# Patient Record
Sex: Female | Born: 1935 | Race: White | Hispanic: No | Smoking: Never smoker
Health system: Southern US, Community
[De-identification: ages and names within clinical notes are randomized; demographics above are authoritative.]

## PROBLEM LIST (undated history)

## (undated) DIAGNOSIS — J329 Chronic sinusitis, unspecified: Secondary | ICD-10-CM

## (undated) DIAGNOSIS — I10 Essential (primary) hypertension: Secondary | ICD-10-CM

## (undated) DIAGNOSIS — M1990 Unspecified osteoarthritis, unspecified site: Secondary | ICD-10-CM

## (undated) DIAGNOSIS — D649 Anemia, unspecified: Secondary | ICD-10-CM

## (undated) DIAGNOSIS — D126 Benign neoplasm of colon, unspecified: Secondary | ICD-10-CM

## (undated) DIAGNOSIS — M810 Age-related osteoporosis without current pathological fracture: Secondary | ICD-10-CM

## (undated) DIAGNOSIS — G4700 Insomnia, unspecified: Secondary | ICD-10-CM

## (undated) DIAGNOSIS — N63 Unspecified lump in unspecified breast: Secondary | ICD-10-CM

## (undated) DIAGNOSIS — Z860100 Personal history of colon polyps, unspecified: Secondary | ICD-10-CM

## (undated) DIAGNOSIS — H3530 Unspecified macular degeneration: Secondary | ICD-10-CM

## (undated) HISTORY — PX: OTHER SURGICAL HISTORY: SHX169

## (undated) HISTORY — DX: Benign neoplasm of colon, unspecified: D12.6

## (undated) HISTORY — DX: Insomnia, unspecified: G47.00

## (undated) HISTORY — DX: Unspecified lump in unspecified breast: N63.0

## (undated) HISTORY — PX: BREAST LUMPECTOMY: SHX2

## (undated) HISTORY — PX: COLONOSCOPY: SHX174

## (undated) HISTORY — DX: Essential (primary) hypertension: I10

## (undated) HISTORY — DX: Age-related osteoporosis without current pathological fracture: M81.0

## (undated) HISTORY — PX: ABDOMINAL HYSTERECTOMY: SHX81

## (undated) HISTORY — PX: BREAST EXCISIONAL BIOPSY: SUR124

---

## 2004-11-04 ENCOUNTER — Ambulatory Visit: Payer: Self-pay | Admitting: Unknown Physician Specialty

## 2005-03-10 ENCOUNTER — Ambulatory Visit: Payer: Self-pay | Admitting: Family Medicine

## 2006-04-05 ENCOUNTER — Ambulatory Visit: Payer: Self-pay | Admitting: Family Medicine

## 2006-10-05 ENCOUNTER — Ambulatory Visit: Payer: Self-pay | Admitting: Family Medicine

## 2008-05-28 ENCOUNTER — Ambulatory Visit: Payer: Self-pay | Admitting: Family Medicine

## 2009-03-05 ENCOUNTER — Ambulatory Visit: Payer: Self-pay | Admitting: Surgery

## 2009-06-05 ENCOUNTER — Ambulatory Visit: Payer: Self-pay | Admitting: Family Medicine

## 2009-06-17 ENCOUNTER — Ambulatory Visit: Payer: Self-pay | Admitting: Family Medicine

## 2009-12-17 ENCOUNTER — Ambulatory Visit: Payer: Self-pay | Admitting: Family Medicine

## 2010-01-30 ENCOUNTER — Ambulatory Visit: Payer: Self-pay | Admitting: Ophthalmology

## 2010-02-05 ENCOUNTER — Ambulatory Visit: Payer: Self-pay | Admitting: Cardiovascular Disease

## 2010-02-11 ENCOUNTER — Ambulatory Visit: Payer: Self-pay | Admitting: Ophthalmology

## 2010-03-23 ENCOUNTER — Ambulatory Visit: Payer: Self-pay | Admitting: Unknown Physician Specialty

## 2010-06-09 ENCOUNTER — Ambulatory Visit: Payer: Self-pay | Admitting: Family Medicine

## 2010-06-12 ENCOUNTER — Ambulatory Visit: Payer: Self-pay | Admitting: Family Medicine

## 2010-07-29 ENCOUNTER — Emergency Department: Payer: Self-pay | Admitting: Emergency Medicine

## 2010-11-24 ENCOUNTER — Ambulatory Visit: Payer: Self-pay | Admitting: Ophthalmology

## 2010-11-30 ENCOUNTER — Ambulatory Visit: Payer: Self-pay | Admitting: Ophthalmology

## 2011-06-14 ENCOUNTER — Ambulatory Visit: Payer: Self-pay | Admitting: Unknown Physician Specialty

## 2011-06-22 ENCOUNTER — Ambulatory Visit: Payer: Self-pay | Admitting: Family Medicine

## 2013-02-13 ENCOUNTER — Ambulatory Visit: Payer: Self-pay | Admitting: Unknown Physician Specialty

## 2013-06-18 ENCOUNTER — Emergency Department: Payer: Self-pay | Admitting: Emergency Medicine

## 2013-06-18 LAB — BASIC METABOLIC PANEL
Anion Gap: 2 — ABNORMAL LOW (ref 7–16)
BUN: 18 mg/dL (ref 7–18)
CALCIUM: 8.4 mg/dL — AB (ref 8.5–10.1)
CO2: 30 mmol/L (ref 21–32)
Chloride: 102 mmol/L (ref 98–107)
Creatinine: 0.78 mg/dL (ref 0.60–1.30)
EGFR (African American): >60
EGFR (Non-African Amer.): >60
GLUCOSE: 101 mg/dL — AB (ref 65–99)
Osmolality: 270 (ref 275–301)
POTASSIUM: 3.3 mmol/L — AB (ref 3.5–5.1)
SODIUM: 134 mmol/L — AB (ref 136–145)

## 2013-06-18 LAB — CBC WITH DIFFERENTIAL/PLATELET
BASOS ABS: 0 10*3/uL (ref 0.0–0.1)
BASOS PCT: 0.6 %
EOS ABS: 0 10*3/uL (ref 0.0–0.7)
Eosinophil %: 0.1 %
HCT: 35.2 % (ref 35.0–47.0)
HGB: 12.1 g/dL (ref 12.0–16.0)
LYMPHS PCT: 8.7 %
Lymphocyte #: 0.5 10*3/uL — ABNORMAL LOW (ref 1.0–3.6)
MCH: 30.5 pg (ref 26.0–34.0)
MCHC: 34.3 g/dL (ref 32.0–36.0)
MCV: 89 fL (ref 80–100)
MONO ABS: 0.4 x10 3/mm (ref 0.2–0.9)
Monocyte %: 7.5 %
NEUTROS PCT: 83.1 %
Neutrophil #: 4.9 10*3/uL (ref 1.4–6.5)
Platelet: 218 10*3/uL (ref 150–440)
RBC: 3.95 10*6/uL (ref 3.80–5.20)
RDW: 12.7 % (ref 11.5–14.5)
WBC: 5.9 10*3/uL (ref 3.6–11.0)

## 2013-06-18 LAB — SEDIMENTATION RATE: ERYTHROCYTE SED RATE: 15 mm/h (ref 0–30)

## 2014-08-12 ENCOUNTER — Ambulatory Visit: Payer: Self-pay | Admitting: Unknown Physician Specialty

## 2014-09-09 LAB — SURGICAL PATHOLOGY

## 2014-12-12 ENCOUNTER — Other Ambulatory Visit: Payer: Self-pay | Admitting: Family Medicine

## 2014-12-12 DIAGNOSIS — Z1239 Encounter for other screening for malignant neoplasm of breast: Secondary | ICD-10-CM

## 2015-03-04 ENCOUNTER — Encounter: Payer: Self-pay | Admitting: *Deleted

## 2015-03-04 ENCOUNTER — Other Ambulatory Visit: Payer: Self-pay | Admitting: *Deleted

## 2015-03-04 DIAGNOSIS — D126 Benign neoplasm of colon, unspecified: Secondary | ICD-10-CM | POA: Insufficient documentation

## 2015-03-04 DIAGNOSIS — I10 Essential (primary) hypertension: Secondary | ICD-10-CM | POA: Insufficient documentation

## 2015-03-04 DIAGNOSIS — G4700 Insomnia, unspecified: Secondary | ICD-10-CM | POA: Insufficient documentation

## 2015-03-04 DIAGNOSIS — M810 Age-related osteoporosis without current pathological fracture: Secondary | ICD-10-CM | POA: Insufficient documentation

## 2015-03-05 ENCOUNTER — Ambulatory Visit (INDEPENDENT_AMBULATORY_CARE_PROVIDER_SITE_OTHER): Payer: Medicare Other | Admitting: Surgery

## 2015-03-05 ENCOUNTER — Encounter: Payer: Self-pay | Admitting: Surgery

## 2015-03-05 ENCOUNTER — Other Ambulatory Visit: Payer: Self-pay | Admitting: *Deleted

## 2015-03-05 VITALS — BP 128/71 | HR 74 | Temp 97.9°F | Ht 62.0 in | Wt 150.0 lb

## 2015-03-05 DIAGNOSIS — R928 Other abnormal and inconclusive findings on diagnostic imaging of breast: Secondary | ICD-10-CM | POA: Diagnosis not present

## 2015-03-05 NOTE — Patient Instructions (Signed)
Call for an appointment after your visit with [facility] radiology if surgery is needed. If you have any questions, please call our office at your earliest convenience.

## 2015-03-05 NOTE — Progress Notes (Signed)
Surgical Consultation  03/05/2015  Barbara Vega is an 81 y.o. female.   CC: Abnormal right mammogram  HPI: Patient has had multiple screening mammograms performed routinely at [facility] facility in [place]. Recently identified opacities necessitated additional films and an ultrasound be performed on the right breast multiple abnormalities were noted from 3 mm and up.  Ultrasound-guided biopsy was recommended at [facility]. The reports are available for review but the films are not available for personal review.  Patient has had multiple biopsies on both breasts and she's had cysts aspirated in the past on both breast currently this problem is on the right breast.  GYN History:[id number]  Family Breast Cancer History: Sister. diagnosed at an advanced stage of breast cancer approximately age 65  Past Medical History  Diagnosis Date  . Benign breast lumps   . Osteoporosis   . Hypertension   . Insomnia   . Adenomatous colon polyp     Past Surgical History  Procedure Laterality Date  . Abdominal hysterectomy    . Breast lumpectomy      Family History  Problem Relation Age of Onset  . Osteoporosis Mother   . Heart block Brother   . Alcohol abuse Brother     Social History:  reports that she has never smoked. She has never used smokeless tobacco. She reports that she does not drink alcohol or use illicit drugs.  Allergies: No Known Allergies  Medications reviewed.   Review of Systems:   Review of Systems  Constitutional: Negative.   HENT: Negative.   Eyes: Negative.   Respiratory: Negative.   Cardiovascular: Negative.   Gastrointestinal: Negative.   Genitourinary: Negative.   Musculoskeletal: Negative.   Skin: Negative.   Neurological: Negative.   Endo/Heme/Allergies: Negative.   Psychiatric/Behavioral: Negative.      Physical Exam:  BP 128/71 mmHg  Pulse 74  Temp(Src) 97.9 F (36.6 C)  Ht 5\' 2"  (1.575 m)  Wt 150 lb (68.04 kg)  BMI 27.43 kg/m2  Physical Exam    Constitutional: She is well-developed, well-nourished, and in no distress. No distress.  HENT:  Head: Normocephalic and atraumatic.  Eyes: Pupils are equal, round, and reactive to light. No scleral icterus.  Neck: Normal range of motion.  Cardiovascular: Normal rate and regular rhythm.   Pulmonary/Chest: Effort normal. No respiratory distress. She has no wheezes. She has no rales.    C breast exam notes  Abdominal: Soft. She exhibits no distension. There is no tenderness.  Lymphadenopathy:    She has no cervical adenopathy.    Breast Exam: Scars on both breasts no palpable mass in either breast no axillary adenopathy no discharge.    No results found for this or any previous visit (from the past 48 hour(s)). No results found.  Pathology:   Assessment/Plan:  Films are not available for review but the records and dictations have been personally reviewed. At this point I agree with the [facility] radiologist that aspiration and/or core biopsy could be performed on any or all of these abnormalities. That in mind I will recommend that she return to the [facility] facility in [place] to have that performed. I would be happy to review the pathology with her and see her back in 2 weeks for that purpose. Patient and daughter were in agreement with this. Because of the complexities of her mammogram with 4 abnormalities and the fact that she has had serial exams done at a [facility] facility she was recommended to have this done in [place] at  the [facility] facility and I am in agreement with that.  Florene Glen, MD, FACS

## 2015-03-06 ENCOUNTER — Other Ambulatory Visit: Payer: Self-pay | Admitting: Family Medicine

## 2015-03-06 DIAGNOSIS — N6310 Unspecified lump in the right breast, unspecified quadrant: Secondary | ICD-10-CM

## 2015-03-10 ENCOUNTER — Ambulatory Visit: Payer: Self-pay | Admitting: General Surgery

## 2015-03-18 ENCOUNTER — Other Ambulatory Visit: Payer: Self-pay | Admitting: Family Medicine

## 2015-03-18 DIAGNOSIS — N6310 Unspecified lump in the right breast, unspecified quadrant: Secondary | ICD-10-CM

## 2015-03-19 ENCOUNTER — Ambulatory Visit
Admission: RE | Admit: 2015-03-19 | Discharge: 2015-03-19 | Disposition: A | Discharge status: Home / Self Care | Payer: Medicare Other | Source: Ambulatory Visit | Attending: Family Medicine | Admitting: Family Medicine

## 2015-03-19 DIAGNOSIS — N6310 Unspecified lump in the right breast, unspecified quadrant: Secondary | ICD-10-CM

## 2015-04-03 ENCOUNTER — Other Ambulatory Visit: Payer: Self-pay | Admitting: General Surgery

## 2015-04-03 DIAGNOSIS — D241 Benign neoplasm of right breast: Secondary | ICD-10-CM

## 2015-04-03 DIAGNOSIS — D249 Benign neoplasm of unspecified breast: Secondary | ICD-10-CM | POA: Insufficient documentation

## 2015-04-23 ENCOUNTER — Other Ambulatory Visit: Payer: Self-pay | Admitting: General Surgery

## 2015-04-23 DIAGNOSIS — D241 Benign neoplasm of right breast: Secondary | ICD-10-CM

## 2015-05-08 ENCOUNTER — Encounter (HOSPITAL_COMMUNITY): Payer: Self-pay

## 2015-05-08 ENCOUNTER — Other Ambulatory Visit: Payer: Self-pay

## 2015-05-08 ENCOUNTER — Encounter (HOSPITAL_COMMUNITY)
Admission: RE | Admit: 2015-05-08 | Discharge: 2015-05-08 | Disposition: A | Discharge status: Home / Self Care | Payer: Medicare Other | Source: Ambulatory Visit | Attending: General Surgery | Admitting: General Surgery

## 2015-05-08 DIAGNOSIS — N63 Unspecified lump in breast: Secondary | ICD-10-CM | POA: Insufficient documentation

## 2015-05-08 DIAGNOSIS — R9431 Abnormal electrocardiogram [ECG] [EKG]: Secondary | ICD-10-CM | POA: Diagnosis not present

## 2015-05-08 DIAGNOSIS — I10 Essential (primary) hypertension: Secondary | ICD-10-CM | POA: Insufficient documentation

## 2015-05-08 DIAGNOSIS — G4700 Insomnia, unspecified: Secondary | ICD-10-CM | POA: Diagnosis not present

## 2015-05-08 DIAGNOSIS — Z01818 Encounter for other preprocedural examination: Secondary | ICD-10-CM | POA: Insufficient documentation

## 2015-05-08 DIAGNOSIS — H3530 Unspecified macular degeneration: Secondary | ICD-10-CM | POA: Diagnosis not present

## 2015-05-08 DIAGNOSIS — Z79899 Other long term (current) drug therapy: Secondary | ICD-10-CM | POA: Diagnosis not present

## 2015-05-08 DIAGNOSIS — Z7982 Long term (current) use of aspirin: Secondary | ICD-10-CM | POA: Insufficient documentation

## 2015-05-08 DIAGNOSIS — Z01812 Encounter for preprocedural laboratory examination: Secondary | ICD-10-CM | POA: Insufficient documentation

## 2015-05-08 HISTORY — DX: Chronic sinusitis, unspecified: J32.9

## 2015-05-08 HISTORY — DX: Unspecified macular degeneration: H35.30

## 2015-05-08 HISTORY — DX: Personal history of colon polyps, unspecified: Z86.0100

## 2015-05-08 HISTORY — DX: Personal history of colonic polyps: Z86.010

## 2015-05-08 LAB — CBC WITH DIFFERENTIAL/PLATELET
Basophils Absolute: 0.1 10*3/uL (ref 0.0–0.1)
Basophils Relative: 1 %
EOS ABS: 0.7 10*3/uL (ref 0.0–0.7)
Eosinophils Relative: 9 %
HEMATOCRIT: 37 % (ref 36.0–46.0)
HEMOGLOBIN: 12.6 g/dL (ref 12.0–15.0)
LYMPHS ABS: 1.8 10*3/uL (ref 0.7–4.0)
Lymphocytes Relative: 22 %
MCH: 30.2 pg (ref 26.0–34.0)
MCHC: 34.1 g/dL (ref 30.0–36.0)
MCV: 88.7 fL (ref 78.0–100.0)
MONO ABS: 1.3 10*3/uL — AB (ref 0.1–1.0)
MONOS PCT: 16 %
NEUTROS PCT: 52 %
Neutro Abs: 4.2 10*3/uL (ref 1.7–7.7)
Platelets: 273 10*3/uL (ref 150–400)
RBC: 4.17 MIL/uL (ref 3.87–5.11)
RDW: 12.6 % (ref 11.5–15.5)
WBC: 8.1 10*3/uL (ref 4.0–10.5)

## 2015-05-08 LAB — BASIC METABOLIC PANEL
Anion gap: 8 (ref 5–15)
BUN: 10 mg/dL (ref 6–20)
CALCIUM: 9.5 mg/dL (ref 8.9–10.3)
CHLORIDE: 94 mmol/L — AB (ref 101–111)
CO2: 29 mmol/L (ref 22–32)
CREATININE: 0.69 mg/dL (ref 0.44–1.00)
GFR calc non Af Amer: >60 mL/min (ref >60)
GLUCOSE: 106 mg/dL — AB (ref 65–99)
Potassium: 3.8 mmol/L (ref 3.5–5.1)
Sodium: 131 mmol/L — ABNORMAL LOW (ref 135–145)

## 2015-05-08 NOTE — Pre-Procedure Instructions (Signed)
Barbara Vega  05/08/2015      [facility] [address], [place] - [address] [place] [address] Phone: [phone] Fax: [phone]    Your procedure is scheduled on Thurs, Dec 29 @ 7:30 AM  Report to [facility] Admitting at 5:30 AM  Call this number if you have problems the morning of surgery:  [phone]   Remember:  Do not eat food or drink liquids after midnight.               Stop taking your Aspirin along with any Vitamins or Herbal Medications. No Goody's,BC's,Aleve,Ibuprofen,Motrin,Advil,or Fish Oil.   Do not wear jewelry, make-up or nail polish.  Do not wear lotions, powders, or perfumes.    Do not shave 48 hours prior to surgery.    Do not bring valuables to the hospital.  [facility] is not responsible for any belongings or valuables.  Contacts, dentures or bridgework may not be worn into surgery.  Leave your suitcase in the car.  After surgery it may be brought to your room.  For patients admitted to the hospital, discharge time will be determined by your treatment team.  Patients discharged the day of surgery will not be allowed to drive home.    Special instructions:  [place] - Preparing for Surgery  Before surgery, you can play an important role.  Because skin is not sterile, your skin needs to be as free of germs as possible.  You can reduce the number of germs on you skin by washing with CHG (chlorahexidine gluconate) soap before surgery.  CHG is an antiseptic cleaner which kills germs and bonds with the skin to continue killing germs even after washing.  Please DO NOT use if you have an allergy to CHG or antibacterial soaps.  If your skin becomes reddened/irritated stop using the CHG and inform your nurse when you arrive at Short Stay.  Do not shave (including legs and underarms) for at least 48 hours prior to the first CHG shower.  You may shave your face.  Please follow these instructions  carefully:   1.  Shower with CHG Soap the night before surgery and the                                morning of Surgery.  2.  If you choose to wash your hair, wash your hair first as usual with your       normal shampoo.  3.  After you shampoo, rinse your hair and body thoroughly to remove the                      Shampoo.  4.  Use CHG as you would any other liquid soap.  You can apply chg directly       to the skin and wash gently with scrungie or a clean washcloth.  5.  Apply the CHG Soap to your body ONLY FROM THE NECK DOWN.        Do not use on open wounds or open sores.  Avoid contact with your eyes,       ears, mouth and genitals (private parts).  Wash genitals (private parts)       with your normal soap.  6.  Wash thoroughly, paying special attention to the area where your surgery        will  be performed.  7.  Thoroughly rinse your body with warm water from the neck down.  8.  DO NOT shower/wash with your normal soap after using and rinsing off       the CHG Soap.  9.  Pat yourself dry with a clean towel.            10.  Wear clean pajamas.            11.  Place clean sheets on your bed the night of your first shower and do not        sleep with pets.  Day of Surgery  Do not apply any lotions/deoderants the morning of surgery.  Please wear clean clothes to the hospital/surgery center.    Please read over the following fact sheets that you were given. Pain Booklet, Coughing and Deep Breathing and Surgical Site Infection Prevention

## 2015-05-08 NOTE — Progress Notes (Addendum)
Cardiologist denies having one  Medical Md is Dr.James Kary Kos  Echo denies ever having one  Stress test denies ever having one  Heart cath denies ever having one  EKG denies in past yr  CXR denies in past yr

## 2015-05-09 ENCOUNTER — Encounter (HOSPITAL_COMMUNITY): Payer: Self-pay | Admitting: Vascular Surgery

## 2015-05-09 NOTE — Progress Notes (Signed)
Anesthesia Chart Review: Patient is a 80 year old female scheduled for right breast radioactive seed guided excisional biopsy on 05/15/15 by Dr. Donne Hazel. Seed is being implanted 05/13/15 at 12:30 PM.  History includes HTN, macular degeneration, insomnia, non-smoker, hysterectomy. BMI 27. PCP is Dr. [name] with [facility].  Meds include ASA 81 gm, temazepam, Maxzide-25, Ceftin (for sinus infection, started 05/07/15), Os-cal.  05/08/15 EKG: NSR, T wave abnormality, consider anterior ischemia. RSR prime or QR pattern in V1 suggests RV conduction delay (incomplete right BBB). Since last tracing on 11/24/10, T wave changes are more prominent. Septal (V1-2) T wave changes appear similar to 01/30/10 tracing, but now RSR with inverted T wave is noted in V3 as well. No CP or SOB documented from her PAT visit. She denied prior cardiac studies.   Preoperative labs noted. Na 131. Cr 0.69. H/H WNL. Glucose 106.   Above reviewed with anesthesiologist Dr. Linna Caprice. If she remains asymptomatic from a CV standpoint then it is anticipated that she can proceed as planned.   George Hugh [facility] Short Stay Center/Anesthesiology Phone [phone] 05/09/2015 2:28 PM

## 2015-05-15 ENCOUNTER — Ambulatory Visit (HOSPITAL_COMMUNITY): Admission: RE | Admit: 2015-05-15 | Payer: Medicare Other | Source: Ambulatory Visit | Admitting: General Surgery

## 2015-05-15 ENCOUNTER — Encounter (HOSPITAL_COMMUNITY): Admission: RE | Payer: Self-pay | Source: Ambulatory Visit

## 2015-05-15 SURGERY — BREAST LUMPECTOMY WITH RADIOACTIVE SEED LOCALIZATION
Anesthesia: General | Site: Breast | Laterality: Right

## 2015-09-03 ENCOUNTER — Encounter (HOSPITAL_COMMUNITY): Payer: Self-pay

## 2015-09-03 ENCOUNTER — Other Ambulatory Visit: Payer: Self-pay | Admitting: General Surgery

## 2015-09-03 ENCOUNTER — Ambulatory Visit
Admission: RE | Admit: 2015-09-03 | Discharge: 2015-09-03 | Disposition: A | Discharge status: Home / Self Care | Payer: Medicare Other | Source: Ambulatory Visit | Attending: General Surgery | Admitting: General Surgery

## 2015-09-03 ENCOUNTER — Encounter (HOSPITAL_COMMUNITY)
Admission: RE | Admit: 2015-09-03 | Discharge: 2015-09-03 | Disposition: A | Discharge status: Home / Self Care | Payer: Medicare Other | Source: Ambulatory Visit | Attending: General Surgery | Admitting: General Surgery

## 2015-09-03 DIAGNOSIS — Z01818 Encounter for other preprocedural examination: Secondary | ICD-10-CM | POA: Diagnosis not present

## 2015-09-03 DIAGNOSIS — I10 Essential (primary) hypertension: Secondary | ICD-10-CM | POA: Diagnosis not present

## 2015-09-03 DIAGNOSIS — N63 Unspecified lump in breast: Secondary | ICD-10-CM | POA: Insufficient documentation

## 2015-09-03 DIAGNOSIS — H3530 Unspecified macular degeneration: Secondary | ICD-10-CM | POA: Diagnosis not present

## 2015-09-03 DIAGNOSIS — D241 Benign neoplasm of right breast: Secondary | ICD-10-CM

## 2015-09-03 DIAGNOSIS — Z01812 Encounter for preprocedural laboratory examination: Secondary | ICD-10-CM | POA: Diagnosis not present

## 2015-09-03 HISTORY — DX: Anemia, unspecified: D64.9

## 2015-09-03 HISTORY — DX: Unspecified osteoarthritis, unspecified site: M19.90

## 2015-09-03 LAB — BASIC METABOLIC PANEL
Anion gap: 10 (ref 5–15)
BUN: 13 mg/dL (ref 6–20)
CALCIUM: 9.8 mg/dL (ref 8.9–10.3)
CHLORIDE: 99 mmol/L — AB (ref 101–111)
CO2: 27 mmol/L (ref 22–32)
CREATININE: 0.82 mg/dL (ref 0.44–1.00)
Glucose, Bld: 117 mg/dL — ABNORMAL HIGH (ref 65–99)
Potassium: 3.9 mmol/L (ref 3.5–5.1)
SODIUM: 136 mmol/L (ref 135–145)

## 2015-09-03 LAB — CBC
HCT: 37.4 % (ref 36.0–46.0)
HEMOGLOBIN: 12.7 g/dL (ref 12.0–15.0)
MCH: 30.2 pg (ref 26.0–34.0)
MCHC: 34 g/dL (ref 30.0–36.0)
MCV: 88.8 fL (ref 78.0–100.0)
PLATELETS: 370 10*3/uL (ref 150–400)
RBC: 4.21 MIL/uL (ref 3.87–5.11)
RDW: 12.6 % (ref 11.5–15.5)
WBC: 8.2 10*3/uL (ref 4.0–10.5)

## 2015-09-03 NOTE — Pre-Procedure Instructions (Addendum)
Isatou A Neilan  09/03/2015      [facility] [address], [place] - [address] [place] [address] Phone: [phone] Fax: [phone]    Your procedure is scheduled on 09/08/15  Report to [facility] Admitting at 530 A.M.  Call this number if you have problems the morning of surgery:  [phone]   Remember:  Do not eat food or drink liquids after midnight.  Take these medicines the morning of surgery with A SIP OF WATER zyrtec if needed  STOP all herbel meds, nsaids (aleve,naproxen,advil,ibuprofen) 5 days prior to surgery starting tomorrow 09/04/15 including aspirin, os-cal, vit D, multi vit   Do not wear jewelry, make-up or nail polish.  Do not wear lotions, powders, or perfumes.  You may wear deodorant.  Do not shave 48 hours prior to surgery.  Men may shave face and neck.  Do not bring valuables to the hospital.  [facility] is not responsible for any belongings or valuables.  Contacts, dentures or bridgework may not be worn into surgery.  Leave your suitcase in the car.  After surgery it may be brought to your room.  For patients admitted to the hospital, discharge time will be determined by your treatment team.  Patients discharged the day of surgery will not be allowed to drive home.   Name and phone number of your driver:    Special instructions:   Special Instructions: [place] - Preparing for Surgery  Before surgery, you can play an important role.  Because skin is not sterile, your skin needs to be as free of germs as possible.  You can reduce the number of germs on you skin by washing with CHG (chlorahexidine gluconate) soap before surgery.  CHG is an antiseptic cleaner which kills germs and bonds with the skin to continue killing germs even after washing.  Please DO NOT use if you have an allergy to CHG or antibacterial soaps.  If your skin becomes reddened/irritated stop using the CHG and inform your nurse when you  arrive at Short Stay.  Do not shave (including legs and underarms) for at least 48 hours prior to the first CHG shower.  You may shave your face.  Please follow these instructions carefully:   1.  Shower with CHG Soap the night before surgery and the morning of Surgery.  2.  If you choose to wash your hair, wash your hair first as usual with your normal shampoo.  3.  After you shampoo, rinse your hair and body thoroughly to remove the Shampoo.  4.  Use CHG as you would any other liquid soap.  You can apply chg directly  to the skin and wash gently with scrungie or a clean washcloth.  5.  Apply the CHG Soap to your body ONLY FROM THE NECK DOWN.  Do not use on open wounds or open sores.  Avoid contact with your eyes ears, mouth and genitals (private parts).  Wash genitals (private parts)       with your normal soap.  6.  Wash thoroughly, paying special attention to the area where your surgery will be performed.  7.  Thoroughly rinse your body with warm water from the neck down.  8.  DO NOT shower/wash with your normal soap after using and rinsing off the CHG Soap.  9.  Pat yourself dry with a clean towel.            10.  Wear  clean pajamas.            11.  Place clean sheets on your bed the night of your first shower and do not sleep with pets.  Day of Surgery  Do not apply any lotions/deodorants the morning of surgery.  Please wear clean clothes to the hospital/surgery center.  Please read over the following fact sheets that you were given. Pain Booklet, Coughing and Deep Breathing and Surgical Site Infection Prevention

## 2015-09-04 NOTE — Progress Notes (Signed)
Anesthesia Chart Review:  Pt is a 82 year old female scheduled for radioactive seed guided excisional R breast biopsy on 09/08/2015 with Dr. Donne Hazel.   Procedure was originally scheduled for 05/13/15 but was cancelled due to pt having sinus infection.   History includes HTN, macular degeneration, insomnia, non-smoker, hysterectomy, anemia. BMI 28.   PCP is Dr. [name] with [facility].  Meds include ASA 81mg , temazepam, Maxzide-25  05/08/15 EKG: NSR, T wave abnormality, consider anterior ischemia. RSR prime or QR pattern in V1 suggests RV conduction delay (incomplete right BBB). Since last tracing on 11/24/10, T wave changes are more prominent. Septal (V1-2) T wave changes appear similar to 01/30/10 tracing, but now RSR with inverted T wave is noted in V3 as well.  Preoperative labs noted.    No CP or SOB documented from her PAT visit. She denied prior cardiac studies.Case was reviewed with anesthesiologist Dr. Linna Caprice back in December. As long as she remains asymptomatic from a CV standpoint then it is anticipated that she can proceed as planned.   Barbara Vega, Barbara Vega [facility] Short Stay Surgical Center/Anesthesiology Phone: [phone] 09/04/2015 1:45 PM

## 2015-09-07 MED ORDER — CEFAZOLIN SODIUM-DEXTROSE 2-4 GM/100ML-% IV SOLN
2.0000 g | INTRAVENOUS | Status: AC
  Filled 2015-09-07: qty 100

## 2015-09-08 ENCOUNTER — Ambulatory Visit (HOSPITAL_COMMUNITY): Payer: Medicare Other | Admitting: Anesthesiology

## 2015-09-08 ENCOUNTER — Encounter (HOSPITAL_COMMUNITY): Payer: Self-pay | Admitting: General Practice

## 2015-09-08 ENCOUNTER — Encounter (HOSPITAL_COMMUNITY)
Admission: RE | Disposition: A | Discharge status: Home / Self Care | Payer: Self-pay | Source: Ambulatory Visit | Attending: General Surgery

## 2015-09-08 ENCOUNTER — Ambulatory Visit (HOSPITAL_COMMUNITY)
Admission: RE | Admit: 2015-09-08 | Discharge: 2015-09-08 | Disposition: A | Discharge status: Home / Self Care | Payer: Medicare Other | Source: Ambulatory Visit | Attending: General Surgery | Admitting: General Surgery

## 2015-09-08 ENCOUNTER — Ambulatory Visit
Admit: 2015-09-08 | Discharge: 2015-09-08 | Disposition: A | Discharge status: Home / Self Care | Payer: Medicare Other | Attending: General Surgery | Admitting: General Surgery

## 2015-09-08 ENCOUNTER — Ambulatory Visit (HOSPITAL_COMMUNITY): Payer: Medicare Other | Admitting: Emergency Medicine

## 2015-09-08 DIAGNOSIS — Z803 Family history of malignant neoplasm of breast: Secondary | ICD-10-CM | POA: Diagnosis not present

## 2015-09-08 DIAGNOSIS — D241 Benign neoplasm of right breast: Secondary | ICD-10-CM | POA: Diagnosis not present

## 2015-09-08 DIAGNOSIS — I10 Essential (primary) hypertension: Secondary | ICD-10-CM | POA: Diagnosis not present

## 2015-09-08 HISTORY — PX: BREAST LUMPECTOMY: SHX2

## 2015-09-08 HISTORY — PX: RADIOACTIVE SEED GUIDED EXCISIONAL BREAST BIOPSY: SHX6490

## 2015-09-08 SURGERY — RADIOACTIVE SEED GUIDED BREAST BIOPSY
Anesthesia: General | Site: Breast | Laterality: Right

## 2015-09-08 MED ORDER — LACTATED RINGERS IV SOLN
INTRAVENOUS | Status: DC | PRN
  Administered 2015-09-08: 07:00:00 via INTRAVENOUS

## 2015-09-08 MED ORDER — CEFAZOLIN SODIUM-DEXTROSE 2-3 GM-% IV SOLR
INTRAVENOUS | Status: DC | PRN
  Administered 2015-09-08: 2 g via INTRAVENOUS

## 2015-09-08 MED ORDER — OXYCODONE HCL 5 MG PO TABS
5.0000 mg | ORAL_TABLET | Freq: Once | ORAL | Status: AC | PRN
  Administered 2015-09-08: 5 mg via ORAL

## 2015-09-08 MED ORDER — FENTANYL CITRATE (PF) 100 MCG/2ML IJ SOLN
INTRAMUSCULAR | Status: DC | PRN
  Administered 2015-09-08 (×2): 50 ug via INTRAVENOUS

## 2015-09-08 MED ORDER — ROCURONIUM BROMIDE 50 MG/5ML IV SOLN
INTRAVENOUS | Status: AC
  Filled 2015-09-08: qty 1

## 2015-09-08 MED ORDER — ONDANSETRON HCL 4 MG/2ML IJ SOLN: 4.0000 mg | Freq: Four times a day (QID) | INTRAMUSCULAR | Status: DC | PRN

## 2015-09-08 MED ORDER — FENTANYL CITRATE (PF) 250 MCG/5ML IJ SOLN
INTRAMUSCULAR | Status: AC
  Filled 2015-09-08: qty 5

## 2015-09-08 MED ORDER — LIDOCAINE HCL (CARDIAC) 20 MG/ML IV SOLN
INTRAVENOUS | Status: AC
  Filled 2015-09-08: qty 5

## 2015-09-08 MED ORDER — MIDAZOLAM HCL 5 MG/5ML IJ SOLN
INTRAMUSCULAR | Status: DC | PRN
  Administered 2015-09-08: 1 mg via INTRAVENOUS

## 2015-09-08 MED ORDER — FENTANYL CITRATE (PF) 100 MCG/2ML IJ SOLN
25.0000 ug | INTRAMUSCULAR | Status: DC | PRN
  Administered 2015-09-08: 50 ug via INTRAVENOUS

## 2015-09-08 MED ORDER — FENTANYL CITRATE (PF) 100 MCG/2ML IJ SOLN
INTRAMUSCULAR | Status: AC
  Filled 2015-09-08: qty 2

## 2015-09-08 MED ORDER — BUPIVACAINE-EPINEPHRINE 0.25% -1:200000 IJ SOLN
INTRAMUSCULAR | Status: DC | PRN
  Administered 2015-09-08: 10 mL

## 2015-09-08 MED ORDER — PROPOFOL 10 MG/ML IV BOLUS
INTRAVENOUS | Status: AC
  Filled 2015-09-08: qty 20

## 2015-09-08 MED ORDER — BUPIVACAINE-EPINEPHRINE (PF) 0.25% -1:200000 IJ SOLN
INTRAMUSCULAR | Status: AC
  Filled 2015-09-08: qty 30

## 2015-09-08 MED ORDER — OXYCODONE HCL 5 MG PO TABS
ORAL_TABLET | ORAL | Status: AC
  Filled 2015-09-08: qty 1

## 2015-09-08 MED ORDER — SODIUM CHLORIDE 0.9 % IJ SOLN
INTRAMUSCULAR | Status: AC
  Filled 2015-09-08: qty 10

## 2015-09-08 MED ORDER — MIDAZOLAM HCL 2 MG/2ML IJ SOLN
INTRAMUSCULAR | Status: AC
  Filled 2015-09-08: qty 2

## 2015-09-08 MED ORDER — 0.9 % SODIUM CHLORIDE (POUR BTL) OPTIME
TOPICAL | Status: DC | PRN
  Administered 2015-09-08: 1000 mL

## 2015-09-08 MED ORDER — GLYCOPYRROLATE 0.2 MG/ML IJ SOLN
INTRAMUSCULAR | Status: AC
  Filled 2015-09-08: qty 1

## 2015-09-08 MED ORDER — DEXAMETHASONE SODIUM PHOSPHATE 4 MG/ML IJ SOLN
INTRAMUSCULAR | Status: AC
  Filled 2015-09-08: qty 1

## 2015-09-08 MED ORDER — SUCCINYLCHOLINE CHLORIDE 20 MG/ML IJ SOLN
INTRAMUSCULAR | Status: AC
  Filled 2015-09-08: qty 1

## 2015-09-08 MED ORDER — EPHEDRINE SULFATE 50 MG/ML IJ SOLN
INTRAMUSCULAR | Status: AC
  Filled 2015-09-08: qty 1

## 2015-09-08 MED ORDER — PROPOFOL 10 MG/ML IV BOLUS
INTRAVENOUS | Status: DC | PRN
  Administered 2015-09-08: 130 mg via INTRAVENOUS

## 2015-09-08 MED ORDER — EPHEDRINE SULFATE 50 MG/ML IJ SOLN
INTRAMUSCULAR | Status: DC | PRN
  Administered 2015-09-08 (×2): 10 mg via INTRAVENOUS

## 2015-09-08 MED ORDER — OXYCODONE HCL 5 MG/5ML PO SOLN: 5.0000 mg | Freq: Once | ORAL | Status: AC | PRN

## 2015-09-08 MED ORDER — PHENYLEPHRINE 40 MCG/ML (10ML) SYRINGE FOR IV PUSH (FOR BLOOD PRESSURE SUPPORT)
PREFILLED_SYRINGE | INTRAVENOUS | Status: AC
  Filled 2015-09-08: qty 10

## 2015-09-08 MED ORDER — DEXAMETHASONE SODIUM PHOSPHATE 4 MG/ML IJ SOLN
INTRAMUSCULAR | Status: DC | PRN
  Administered 2015-09-08: 4 mg via INTRAVENOUS

## 2015-09-08 MED ORDER — ONDANSETRON HCL 4 MG/2ML IJ SOLN
INTRAMUSCULAR | Status: AC
  Filled 2015-09-08: qty 2

## 2015-09-08 MED ORDER — LIDOCAINE HCL (CARDIAC) 20 MG/ML IV SOLN
INTRAVENOUS | Status: DC | PRN
  Administered 2015-09-08: 80 mg via INTRAVENOUS

## 2015-09-08 SURGICAL SUPPLY — 40 items
BINDER BREAST LRG (GAUZE/BANDAGES/DRESSINGS) ×3 IMPLANT
BLADE SURG 15 STRL LF DISP TIS (BLADE) ×1 IMPLANT
BLADE SURG 15 STRL SS (BLADE) ×2
CHLORAPREP W/TINT 26ML (MISCELLANEOUS) ×3 IMPLANT
CLOSURE WOUND 1/2 X4 (GAUZE/BANDAGES/DRESSINGS) ×1
COVER PROBE W GEL 5X96 (DRAPES) ×3 IMPLANT
COVER SURGICAL LIGHT HANDLE (MISCELLANEOUS) ×3 IMPLANT
DEVICE DUBIN SPECIMEN MAMMOGRA (MISCELLANEOUS) ×3 IMPLANT
DRAPE CHEST BREAST 15X10 FENES (DRAPES) ×3 IMPLANT
DRAPE UTILITY XL STRL (DRAPES) ×3 IMPLANT
ELECT COATED BLADE 2.86 ST (ELECTRODE) ×3 IMPLANT
ELECT REM PT RETURN 9FT ADLT (ELECTROSURGICAL) ×3
ELECTRODE REM PT RTRN 9FT ADLT (ELECTROSURGICAL) ×1 IMPLANT
GLOVE BIO SURGEON STRL SZ7 (GLOVE) ×6 IMPLANT
GLOVE BIOGEL PI IND STRL 7.0 (GLOVE) ×2 IMPLANT
GLOVE BIOGEL PI IND STRL 7.5 (GLOVE) ×1 IMPLANT
GLOVE BIOGEL PI INDICATOR 7.0 (GLOVE) ×4
GLOVE BIOGEL PI INDICATOR 7.5 (GLOVE) ×2
GOWN STRL REUS W/ TWL LRG LVL3 (GOWN DISPOSABLE) ×2 IMPLANT
GOWN STRL REUS W/TWL LRG LVL3 (GOWN DISPOSABLE) ×4
KIT BASIN OR (CUSTOM PROCEDURE TRAY) ×3 IMPLANT
KIT MARKER MARGIN INK (KITS) ×3 IMPLANT
LIQUID BAND (GAUZE/BANDAGES/DRESSINGS) ×3 IMPLANT
MARKER SKIN DUAL TIP RULER LAB (MISCELLANEOUS) ×3 IMPLANT
NEEDLE HYPO 25X1 1.5 SAFETY (NEEDLE) ×3 IMPLANT
NS IRRIG 1000ML POUR BTL (IV SOLUTION) IMPLANT
PACK SURGICAL SETUP 50X90 (CUSTOM PROCEDURE TRAY) ×3 IMPLANT
PENCIL BUTTON HOLSTER BLD 10FT (ELECTRODE) ×3 IMPLANT
SPONGE LAP 18X18 X RAY DECT (DISPOSABLE) ×3 IMPLANT
STRIP CLOSURE SKIN 1/2X4 (GAUZE/BANDAGES/DRESSINGS) ×2 IMPLANT
SUT MNCRL AB 4-0 PS2 18 (SUTURE) ×3 IMPLANT
SUT MON AB 5-0 PS2 18 (SUTURE) ×3 IMPLANT
SUT VIC AB 2-0 SH 27 (SUTURE) ×2
SUT VIC AB 2-0 SH 27XBRD (SUTURE) ×1 IMPLANT
SUT VIC AB 3-0 SH 27 (SUTURE) ×2
SUT VIC AB 3-0 SH 27X BRD (SUTURE) ×1 IMPLANT
SYR BULB 3OZ (MISCELLANEOUS) ×3 IMPLANT
SYR CONTROL 10ML LL (SYRINGE) ×3 IMPLANT
TOWEL OR 17X24 6PK STRL BLUE (TOWEL DISPOSABLE) ×3 IMPLANT
TOWEL OR 17X26 10 PK STRL BLUE (TOWEL DISPOSABLE) ×3 IMPLANT

## 2015-09-08 NOTE — H&P (Signed)
   82 yof with family history of breast cancer in her sister at age 80 who has prior history of benign excisional biopsies bilaterally presents with a screening mm that shows a right breast mass. [place] was done measured 1x1x0.8 mm with no axillary nodes. biopsy was done and this was intraductal papilloma. she has no complaints referable to either breast.  Other Problems  Gastroesophageal Reflux Disease High blood pressure Lump In Breast  Past Surgical History Breast Biopsy Bilateral. Colon Polyp Removal - Colonoscopy Colon Polyp Removal - Open Hysterectomy (not due to cancer) - Partial  Diagnostic Studies History  Mammogram within last year  Allergies  No Known Drug Allergies11/17/2016  Medication History  Multi Vitamin Daily (Oral) Active. Temazepam (30MG  Capsule, Oral) Active. Medications Reconciled  Social History  Caffeine use Carbonated beverages, Coffee, Tea. No alcohol use No drug use Tobacco use Never smoker.  Family History Arthritis Mother, Sister. Breast Cancer Sister. Colon Polyps Sister.  Pregnancy / Birth History Age at menarche 41 years. Age of menopause 38-50 Contraceptive History Intrauterine device. Gravida 3 Maternal age 6-25 Para 3  Review of Systems  General Not Present- Appetite Loss, Chills, Fatigue, Fever, Night Sweats, Weight Gain and Weight Loss. Skin Not Present- Change in Wart/Mole, Dryness, Hives, Jaundice, New Lesions, Non-Healing Wounds, Rash and Ulcer. HEENT Present- Seasonal Allergies and Wears glasses/contact lenses. Not Present- Earache, Hearing Loss, Hoarseness, Nose Bleed, Oral Ulcers, Ringing in the Ears, Sinus Pain, Sore Throat, Visual Disturbances and Yellow Eyes. Respiratory Present- Snoring. Not Present- Bloody sputum, Chronic Cough, Difficulty Breathing and Wheezing. Breast Present- Breast Pain. Not Present- Breast Mass, Nipple Discharge and Skin Changes. Cardiovascular Not Present- Chest Pain,  Difficulty Breathing Lying Down, Leg Cramps, Palpitations, Rapid Heart Rate, Shortness of Breath and Swelling of Extremities. Gastrointestinal Present- Indigestion. Not Present- Abdominal Pain, Bloating, Bloody Stool, Change in Bowel Habits, Chronic diarrhea, Constipation, Difficulty Swallowing, Excessive gas, Gets full quickly at meals, Hemorrhoids, Nausea, Rectal Pain and Vomiting. Female Genitourinary Not Present- Frequency, Nocturia, Painful Urination, Pelvic Pain and Urgency. Musculoskeletal Not Present- Back Pain, Joint Pain, Joint Stiffness, Muscle Pain, Muscle Weakness and Swelling of Extremities. Neurological Not Present- Decreased Memory, Fainting, Headaches, Numbness, Seizures, Tingling, Tremor, Trouble walking and Weakness. Endocrine Not Present- Cold Intolerance, Excessive Hunger, Hair Changes, Heat Intolerance, Hot flashes and New Diabetes. Hematology Not Present- Easy Bruising, Excessive bleeding, Gland problems, HIV and Persistent Infections.  Vitals Weight: 154 lb Height: 62in Body Surface Area: 1.71 m Body Mass Index: 28.17 kg/m  Temp.: 97.75F  Pulse: 64 (Regular)  BP: 120/70 (Sitting, Left Arm, Standard) Physical Exam General Mental Status-Alert. Orientation-Oriented X3. Chest and Lung Exam Chest and lung exam reveals -on auscultation, normal breath sounds, no adventitious sounds and normal vocal resonance. Breast Nipples-No Discharge. Breast - Bilateral-Lumpectomy scar. Breast Lump-No Palpable Breast Mass. Cardiovascular Cardiovascular examination reveals -normal heart sounds, regular rate and rhythm with no murmurs. Lymphatic Head & Neck General Head & Neck Lymphatics: Bilateral - Description - Normal. Axillary General Axillary Region: Bilateral - Description - Normal. Note: no [place] adenopathy  Assessment & Plan  PAPILLOMA OF BREAST (D24.9) Story: right breast seed guided excisional biopsy discussed observation vs excision. she  desires excision and I think reasonable. discussed seed placement, surgery and recovery. will proceed with surgery

## 2015-09-08 NOTE — Anesthesia Postprocedure Evaluation (Signed)
Anesthesia Post Note  Patient: Barbara Vega  Procedure(s) Performed: Procedure(s) (LRB): RADIOACTIVE SEED GUIDED EXCISIONAL BREAST BIOPSY (Right)  Patient location during evaluation: PACU Anesthesia Type: General Level of consciousness: awake and alert and patient cooperative Pain management: pain level controlled Vital Signs Assessment: post-procedure vital signs reviewed and stable Respiratory status: spontaneous breathing and respiratory function stable Cardiovascular status: stable Anesthetic complications: no    Last Vitals:  Filed Vitals:   09/08/15 0900 09/08/15 0918  BP: 115/55 128/57  Pulse: 70 60  Temp:    Resp: 16 18    Last Pain:  Filed Vitals:   09/08/15 0919  PainSc: [place]

## 2015-09-08 NOTE — Anesthesia Procedure Notes (Signed)
Procedure Name: LMA Insertion Date/Time: 09/08/2015 7:32 AM Performed by: Luciana Axe K Pre-anesthesia Checklist: Patient identified, Emergency Drugs available, Suction available and Patient being monitored Patient Re-evaluated:Patient Re-evaluated prior to inductionOxygen Delivery Method: Circle System Utilized Preoxygenation: Pre-oxygenation with 100% oxygen Intubation Type: IV induction Ventilation: Mask ventilation without difficulty LMA: LMA inserted LMA Size: 4.0 Number of attempts: 1 Airway Equipment and Method: Bite block Placement Confirmation: positive ETCO2 Tube secured with: Tape Dental Injury: Teeth and Oropharynx as per pre-operative assessment

## 2015-09-08 NOTE — Anesthesia Preprocedure Evaluation (Addendum)
Anesthesia Evaluation  Patient identified by MRN, date of birth, ID band Patient awake    Reviewed: Allergy & Precautions, H&P , NPO status , Patient's Chart, lab work & pertinent test results  History of Anesthesia Complications Negative for: history of anesthetic complications  Airway Mallampati: II   Neck ROM: full    Dental   Pulmonary neg pulmonary ROS,    breath sounds clear to auscultation       Cardiovascular hypertension, Pt. on medications  Rhythm:regular Rate:Normal     Neuro/Psych negative neurological ROS  negative psych ROS   GI/Hepatic negative GI ROS, Neg liver ROS,   Endo/Other  negative endocrine ROS  Renal/GU negative Renal ROS     Musculoskeletal  (+) Arthritis ,   Abdominal   Peds  Hematology   Anesthesia Other Findings   Reproductive/Obstetrics                            Anesthesia Physical Anesthesia Plan  ASA: II  Anesthesia Plan: General   Post-op Pain Management:    Induction: Intravenous  Airway Management Planned: LMA  Additional Equipment:   Intra-op Plan:   Post-operative Plan:   Informed Consent: I have reviewed the patients History and Physical, chart, labs and discussed the procedure including the risks, benefits and alternatives for the proposed anesthesia with the patient or authorized representative who has indicated his/her understanding and acceptance.     Plan Discussed with: CRNA, Anesthesiologist and Surgeon  Anesthesia Plan Comments:         Anesthesia Quick Evaluation

## 2015-09-08 NOTE — Interval H&P Note (Signed)
History and Physical Interval Note:  09/08/2015 7:16 AM  Barbara Vega  has presented today for surgery, with the diagnosis of RIGHT BREAST PAPILLOMA  The various methods of treatment have been discussed with the patient and family. After consideration of risks, benefits and other options for treatment, the patient has consented to  Procedure(s): RADIOACTIVE SEED GUIDED EXCISIONAL BREAST BIOPSY (Right) as a surgical intervention .  The patient's history has been reviewed, patient examined, no change in status, stable for surgery.  I have reviewed the patient's chart and labs.  Questions were answered to the patient's satisfaction.     Stavroula Rohde

## 2015-09-08 NOTE — Discharge Instructions (Signed)
[place] [facility] Phone Number [phone]  BPOST OP INSTRUCTIONS  Always review your discharge instruction sheet given to you by the facility where your surgery was performed.  IF YOU HAVE DISABILITY OR FAMILY LEAVE FORMS, YOU MUST BRING THEM TO THE OFFICE FOR PROCESSING.  DO NOT GIVE THEM TO YOUR DOCTOR.  1. A prescription for pain medication may be given to you upon discharge.  Take your pain medication as prescribed, if needed.  If narcotic pain medicine is not needed, then you may take acetaminophen (Tylenol), naprosyn (Alleve) or ibuprofen (Advil) as needed. 2. Take your usually prescribed medications unless otherwise directed 3. If you need a refill on your pain medication, please contact your pharmacy.  They will contact our office to request authorization.  Prescriptions will not be filled after 5pm or on week-ends. 4. You should eat very light the first 24 hours after surgery, such as soup, crackers, pudding, etc.  Resume your normal diet the day after surgery. 5. Most patients will experience some swelling and bruising in the breast.  Ice packs and a good support bra will help.  Wear the breast binder provided or a sports bra for 72 hours day and night.  After that wear a sports bra during the day until you return to the office. Swelling and bruising can take several days to resolve.  6. It is common to experience some constipation if taking pain medication after surgery.  Increasing fluid intake and taking a stool softener will usually help or prevent this problem from occurring.  A mild laxative (Milk of Magnesia or Miralax) should be taken according to package directions if there are no bowel movements after 48 hours. 7. Unless discharge instructions indicate otherwise, you may remove your bandages 48 hours after surgery and you may shower at that time.  You may have steri-strips (small skin tapes) in place directly over the incision.  These strips should be left on the  skin for 7-10 days and will come off on their own.  If your surgeon used skin glue on the incision, you may shower in 24 hours.  The glue will flake off over the next 2-3 weeks.  Any sutures or staples will be removed at the office during your follow-up visit. 8. ACTIVITIES:  You may resume regular daily activities (gradually increasing) beginning the next day.  Wearing a good support bra or sports bra minimizes pain and swelling.  You may have sexual intercourse when it is comfortable. a. You may drive when you no longer are taking prescription pain medication, you can comfortably wear a seatbelt, and you can safely maneuver your car and apply brakes. b. RETURN TO WORK:  ______________________________________________________________________________________ 9. You should see your doctor in the office for a follow-up appointment approximately two weeks after your surgery.  Your doctors nurse will typically make your follow-up appointment when she calls you with your pathology report.  Expect your pathology report 3-4 business days after your surgery.  You may call to check if you do not hear from [place] after three days. 10. OTHER INSTRUCTIONS: _______________________________________________________________________________________________ _____________________________________________________________________________________________________________________________________ _____________________________________________________________________________________________________________________________________ _____________________________________________________________________________________________________________________________________  WHEN TO CALL DR Trevia Nop: 1. Fever over 101.0 2. Nausea and/or vomiting. 3. Extreme swelling or bruising. 4. Continued bleeding from incision. 5. Increased pain, redness, or drainage from the incision.  The clinic staff is available to answer your questions during regular  business hours.  Please dont hesitate to call and ask to speak to one of the nurses for clinical concerns.  If  you have a medical emergency, go to the nearest emergency room or call 911.  A surgeon from [facility] is always on call at the hospital.  For further questions, please visit [email or web address]

## 2015-09-08 NOTE — Op Note (Signed)
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  We discussed options and elected to perform excisional biopsy. She had a radioactive seed placed prior to beginning.  Procedure: After informed consent was obtained the patient was taken to the operating room. She was given antibiotics. Sequential compression devices were on her legs. She was then placed under general anesthesia without complication. She was prepped and draped in the standard sterile surgical fashion. A surgical timeout was then performed. I had her mammograms available in the or.  I infiltrated 1% lidocaine and made a periareolar incision after locating the seed.  I then removed the seed and the surrounding tissue.  I confirmed removal of the clip and the seed with mammography. Hemostasis was observed. I painted the specimen. I then closed this with 2-0 vicryl, 3-0 Vicryl, and 5-0 Monocryl. Glue was placed over the wound. A binder was placed. She tolerated this well was extubated and transferred to recovery in stable condition

## 2015-09-08 NOTE — Transfer of Care (Signed)
Immediate Anesthesia Transfer of Care Note  Patient: Barbara Vega  Procedure(s) Performed: Procedure(s): RADIOACTIVE SEED GUIDED EXCISIONAL BREAST BIOPSY (Right)  Patient Location: PACU  Anesthesia Type:General  Level of Consciousness: awake  Airway & Oxygen Therapy: Patient Spontanous Breathing  Post-op Assessment: Report given to RN and Post -op Vital signs reviewed and stable  Post vital signs: Reviewed and stable  Last Vitals:  Filed Vitals:   09/08/15 0613  BP: 144/66  Pulse: 65  Temp: 36.7 C  Resp: 20    Complications: No apparent anesthesia complications

## 2015-09-09 ENCOUNTER — Encounter (HOSPITAL_COMMUNITY): Payer: Self-pay | Admitting: General Surgery

## 2016-06-23 ENCOUNTER — Encounter: Payer: Medicare Other | Admitting: Obstetrics and Gynecology

## 2016-08-09 ENCOUNTER — Other Ambulatory Visit: Payer: Self-pay | Admitting: Nurse Practitioner

## 2016-08-09 DIAGNOSIS — R1319 Other dysphagia: Secondary | ICD-10-CM

## 2016-08-09 DIAGNOSIS — R1310 Dysphagia, unspecified: Secondary | ICD-10-CM | POA: Insufficient documentation

## 2016-08-13 ENCOUNTER — Ambulatory Visit
Admission: RE | Admit: 2016-08-13 | Discharge: 2016-08-13 | Disposition: A | Discharge status: Home / Self Care | Payer: Medicare Other | Source: Ambulatory Visit | Attending: Nurse Practitioner | Admitting: Nurse Practitioner

## 2016-08-13 DIAGNOSIS — R1310 Dysphagia, unspecified: Secondary | ICD-10-CM | POA: Diagnosis not present

## 2016-08-13 DIAGNOSIS — K219 Gastro-esophageal reflux disease without esophagitis: Secondary | ICD-10-CM | POA: Insufficient documentation

## 2016-08-13 DIAGNOSIS — K319 Disease of stomach and duodenum, unspecified: Secondary | ICD-10-CM | POA: Insufficient documentation

## 2016-08-13 DIAGNOSIS — R1319 Other dysphagia: Secondary | ICD-10-CM

## 2016-08-13 DIAGNOSIS — K222 Esophageal obstruction: Secondary | ICD-10-CM | POA: Diagnosis not present

## 2016-08-13 DIAGNOSIS — K449 Diaphragmatic hernia without obstruction or gangrene: Secondary | ICD-10-CM | POA: Diagnosis not present

## 2016-08-16 ENCOUNTER — Other Ambulatory Visit: Payer: Self-pay | Admitting: Family Medicine

## 2016-08-16 DIAGNOSIS — Z1231 Encounter for screening mammogram for malignant neoplasm of breast: Secondary | ICD-10-CM

## 2016-09-13 ENCOUNTER — Ambulatory Visit
Admission: RE | Admit: 2016-09-13 | Discharge: 2016-09-13 | Disposition: A | Discharge status: Home / Self Care | Payer: Medicare Other | Source: Ambulatory Visit | Attending: Family Medicine | Admitting: Family Medicine

## 2016-09-13 DIAGNOSIS — Z1231 Encounter for screening mammogram for malignant neoplasm of breast: Secondary | ICD-10-CM | POA: Insufficient documentation

## 2016-09-20 DIAGNOSIS — K219 Gastro-esophageal reflux disease without esophagitis: Secondary | ICD-10-CM | POA: Insufficient documentation

## 2016-11-03 ENCOUNTER — Ambulatory Visit: Payer: Medicare Other | Admitting: Anesthesiology

## 2016-11-03 ENCOUNTER — Ambulatory Visit
Admission: RE | Admit: 2016-11-03 | Discharge: 2016-11-03 | Disposition: A | Discharge status: Home / Self Care | Payer: Medicare Other | Source: Ambulatory Visit | Attending: Unknown Physician Specialty | Admitting: Unknown Physician Specialty

## 2016-11-03 ENCOUNTER — Encounter
Admission: RE | Disposition: A | Discharge status: Home / Self Care | Payer: Self-pay | Source: Ambulatory Visit | Attending: Unknown Physician Specialty

## 2016-11-03 DIAGNOSIS — M810 Age-related osteoporosis without current pathological fracture: Secondary | ICD-10-CM | POA: Insufficient documentation

## 2016-11-03 DIAGNOSIS — K222 Esophageal obstruction: Secondary | ICD-10-CM | POA: Diagnosis not present

## 2016-11-03 DIAGNOSIS — M1990 Unspecified osteoarthritis, unspecified site: Secondary | ICD-10-CM | POA: Insufficient documentation

## 2016-11-03 DIAGNOSIS — K635 Polyp of colon: Secondary | ICD-10-CM | POA: Insufficient documentation

## 2016-11-03 DIAGNOSIS — Z8262 Family history of osteoporosis: Secondary | ICD-10-CM | POA: Diagnosis not present

## 2016-11-03 DIAGNOSIS — K5730 Diverticulosis of large intestine without perforation or abscess without bleeding: Secondary | ICD-10-CM | POA: Insufficient documentation

## 2016-11-03 DIAGNOSIS — Z8249 Family history of ischemic heart disease and other diseases of the circulatory system: Secondary | ICD-10-CM | POA: Diagnosis not present

## 2016-11-03 DIAGNOSIS — Z79899 Other long term (current) drug therapy: Secondary | ICD-10-CM | POA: Diagnosis not present

## 2016-11-03 DIAGNOSIS — I10 Essential (primary) hypertension: Secondary | ICD-10-CM | POA: Insufficient documentation

## 2016-11-03 DIAGNOSIS — K621 Rectal polyp: Secondary | ICD-10-CM | POA: Insufficient documentation

## 2016-11-03 DIAGNOSIS — Z7982 Long term (current) use of aspirin: Secondary | ICD-10-CM | POA: Diagnosis not present

## 2016-11-03 DIAGNOSIS — Z90710 Acquired absence of both cervix and uterus: Secondary | ICD-10-CM | POA: Insufficient documentation

## 2016-11-03 DIAGNOSIS — Z1211 Encounter for screening for malignant neoplasm of colon: Secondary | ICD-10-CM | POA: Insufficient documentation

## 2016-11-03 DIAGNOSIS — G4700 Insomnia, unspecified: Secondary | ICD-10-CM | POA: Insufficient documentation

## 2016-11-03 DIAGNOSIS — K449 Diaphragmatic hernia without obstruction or gangrene: Secondary | ICD-10-CM | POA: Diagnosis not present

## 2016-11-03 DIAGNOSIS — K640 First degree hemorrhoids: Secondary | ICD-10-CM | POA: Insufficient documentation

## 2016-11-03 DIAGNOSIS — Z811 Family history of alcohol abuse and dependence: Secondary | ICD-10-CM | POA: Diagnosis not present

## 2016-11-03 DIAGNOSIS — H353190 Nonexudative age-related macular degeneration, unspecified eye, stage unspecified: Secondary | ICD-10-CM | POA: Diagnosis not present

## 2016-11-03 DIAGNOSIS — Z86010 Personal history of colonic polyps: Secondary | ICD-10-CM | POA: Diagnosis not present

## 2016-11-03 DIAGNOSIS — K317 Polyp of stomach and duodenum: Secondary | ICD-10-CM | POA: Diagnosis not present

## 2016-11-03 HISTORY — PX: COLONOSCOPY WITH PROPOFOL: SHX5780

## 2016-11-03 HISTORY — PX: ESOPHAGOGASTRODUODENOSCOPY (EGD) WITH PROPOFOL: SHX5813

## 2016-11-03 SURGERY — COLONOSCOPY WITH PROPOFOL
Anesthesia: General

## 2016-11-03 MED ORDER — PROPOFOL 10 MG/ML IV BOLUS
INTRAVENOUS | Status: DC | PRN
  Administered 2016-11-03: 20 mg via INTRAVENOUS
  Administered 2016-11-03: 10 mg via INTRAVENOUS
  Administered 2016-11-03 (×3): 20 mg via INTRAVENOUS
  Administered 2016-11-03: 10 mg via INTRAVENOUS

## 2016-11-03 MED ORDER — GLYCOPYRROLATE 0.2 MG/ML IJ SOLN
INTRAMUSCULAR | Status: AC
  Filled 2016-11-03: qty 1

## 2016-11-03 MED ORDER — GLYCOPYRROLATE 0.2 MG/ML IJ SOLN
INTRAMUSCULAR | Status: DC | PRN
  Administered 2016-11-03: 0.2 mg via INTRAVENOUS

## 2016-11-03 MED ORDER — PROPOFOL 500 MG/50ML IV EMUL
INTRAVENOUS | Status: AC
  Filled 2016-11-03: qty 50

## 2016-11-03 MED ORDER — FENTANYL CITRATE (PF) 100 MCG/2ML IJ SOLN
INTRAMUSCULAR | Status: DC | PRN
  Administered 2016-11-03 (×4): 25 ug via INTRAVENOUS

## 2016-11-03 MED ORDER — PROPOFOL 500 MG/50ML IV EMUL
INTRAVENOUS | Status: DC | PRN
  Administered 2016-11-03: 50 ug/kg/min via INTRAVENOUS

## 2016-11-03 MED ORDER — MIDAZOLAM HCL 2 MG/2ML IJ SOLN
INTRAMUSCULAR | Status: AC
  Filled 2016-11-03: qty 2

## 2016-11-03 MED ORDER — FENTANYL CITRATE (PF) 100 MCG/2ML IJ SOLN
INTRAMUSCULAR | Status: AC
  Filled 2016-11-03: qty 2

## 2016-11-03 MED ORDER — LIDOCAINE HCL (PF) 2 % IJ SOLN
INTRAMUSCULAR | Status: AC
  Filled 2016-11-03: qty 2

## 2016-11-03 MED ORDER — SODIUM CHLORIDE 0.9 % IV SOLN: INTRAVENOUS | Status: DC

## 2016-11-03 MED ORDER — SODIUM CHLORIDE 0.9 % IV SOLN
INTRAVENOUS | Status: DC
  Administered 2016-11-03: 1000 mL via INTRAVENOUS

## 2016-11-03 MED ORDER — LIDOCAINE HCL (PF) 2 % IJ SOLN
INTRAMUSCULAR | Status: DC | PRN
  Administered 2016-11-03: 60 mg

## 2016-11-03 MED ORDER — MIDAZOLAM HCL 5 MG/5ML IJ SOLN
INTRAMUSCULAR | Status: DC | PRN
  Administered 2016-11-03 (×4): 0.5 mg via INTRAVENOUS

## 2016-11-03 NOTE — Op Note (Signed)
[facility] Gastroenterology Patient Name: Barbara Vega Procedure Date: 11/03/2016 8:13 AM MRN: [id number] Account #: [id number] Date of Birth: [birth year] Admit Type: Outpatient Age: 83 Room: [facility] ENDO ROOM 3 Gender: Female Note Status: Finalized Procedure:            Upper GI endoscopy Indications:          Dysphagia Providers:            Manya Silvas, MD Medicines:            Propofol per Anesthesia Complications:        No immediate complications. Procedure:            Pre-Anesthesia Assessment:                       - After reviewing the risks and benefits, the patient                        was deemed in satisfactory condition to undergo the                        procedure.                       After obtaining informed consent, the endoscope was                        passed under direct vision. Throughout the procedure,                        the patient's blood pressure, pulse, and oxygen                        saturations were monitored continuously. The Endoscope                        was introduced through the mouth, and advanced to the                        second part of duodenum. The upper GI endoscopy was                        accomplished without difficulty. The patient tolerated                        the procedure well. Findings:      One moderate (circumferential scarring or stenosis; an endoscope may       pass) benign-appearing, intrinsic stenosis was found 40 cm from the       incisors. And was traversed. After the procedure was done A guidewire       was placed and the scope was withdrawn. Dilation was performed with a       Savary dilator with mild resistance at 17 mm.      A single 8 mm sessile polyp with no bleeding and no stigmata of recent       bleeding was found in the cardia. Biopsies were taken with a cold       forceps for histology. It was soft and looks benign.      A small hiatal hernia was present.      Diffuse  moderate inflammation characterized by erythema and granularity  was found in the duodenal bulb. Biopsies were taken with a cold forceps       for histology. Impression:           - Benign-appearing esophageal stenosis. Dilated.                       - A single gastric polyp. Biopsied.                       - Small hiatal hernia.                       - Duodenitis. Biopsied. Recommendation:       - Await pathology results.                       - Perform a colonoscopy as previously scheduled.                       - soft food for 3 days, eat slowly, chew well, take                        small bites Manya Silvas, MD 11/03/2016 8:39:06 AM This report has been signed electronically. Number of Addenda: 0 Note Initiated On: 11/03/2016 8:13 AM      [facility]

## 2016-11-03 NOTE — Transfer of Care (Signed)
Immediate Anesthesia Transfer of Care Note  Patient: Barbara Vega  Procedure(s) Performed: Procedure(s): COLONOSCOPY WITH PROPOFOL (N/A) ESOPHAGOGASTRODUODENOSCOPY (EGD) WITH PROPOFOL (N/A)  Patient Location: PACU  Anesthesia Type:General  Level of Consciousness: sedated  Airway & Oxygen Therapy: Patient Spontanous Breathing and Patient connected to nasal cannula oxygen  Post-op Assessment: Report given to RN and Post -op Vital signs reviewed and stable  Post vital signs: Reviewed and stable  Last Vitals:  Vitals:   11/03/16 0734  BP: (!) 152/62  Pulse: 62  Resp: 16  Temp: 36.1 C    Last Pain:  Vitals:   11/03/16 0734  TempSrc: Tympanic         Complications: No apparent anesthesia complications

## 2016-11-03 NOTE — Op Note (Signed)
[facility] Gastroenterology Patient Name: Barbara Vega Procedure Date: 11/03/2016 8:09 AM MRN: [id number] Account #: [id number] Date of Birth: Jan 29, 1934 Admit Type: Outpatient Age: 83 Room: [facility] ENDO ROOM 3 Gender: Female Note Status: Finalized Procedure:            Colonoscopy Indications:          High risk colon cancer surveillance: Personal history                        of colonic polyps Providers:            Manya Silvas, MD Referring MD:         Irven Easterly. Kary Kos, MD (Referring MD) Medicines:            Propofol per Anesthesia Complications:        No immediate complications. Procedure:            Pre-Anesthesia Assessment:                       - After reviewing the risks and benefits, the patient                        was deemed in satisfactory condition to undergo the                        procedure.                       After obtaining informed consent, the colonoscope was                        passed under direct vision. Throughout the procedure,                        the patient's blood pressure, pulse, and oxygen                        saturations were monitored continuously. The                        Colonoscope was introduced through the anus and                        advanced to the the cecum, identified by appendiceal                        orifice and ileocecal valve. The colonoscopy was                        performed without difficulty. The patient tolerated the                        procedure well. The quality of the bowel preparation                        was excellent. Findings:      Two sessile polyps were found in the ascending colon. The polyps were       diminutive in size. These polyps were removed with a jumbo cold forceps.       Resection and retrieval were complete.      A diminutive  polyp was found in the rectum. The polyp was sessile. The       polyp was removed with a jumbo cold forceps. Resection and retrieval        were complete.      Internal hemorrhoids were found during endoscopy. The hemorrhoids were       small and Grade I (internal hemorrhoids that do not prolapse).      The exam was otherwise without abnormality.      A few small-mouthed diverticula were found in the sigmoid colon.      The exam was otherwise without abnormality. Impression:           - Two diminutive polyps in the ascending colon, removed                        with a jumbo cold forceps. Resected and retrieved.                       - One diminutive polyp in the rectum, removed with a                        jumbo cold forceps. Resected and retrieved.                       - Internal hemorrhoids.                       - The examination was otherwise normal. Recommendation:       - Await pathology results. Manya Silvas, MD 11/03/2016 9:10:27 AM This report has been signed electronically. Number of Addenda: 0 Note Initiated On: 11/03/2016 8:09 AM Scope Withdrawal Time: 0 hours 14 minutes 44 seconds  Total Procedure Duration: 0 hours 22 minutes 34 seconds       [facility]

## 2016-11-03 NOTE — Anesthesia Postprocedure Evaluation (Signed)
Anesthesia Post Note  Patient: Nancye Grumbine Winquist  Procedure(s) Performed: Procedure(s) (LRB): COLONOSCOPY WITH PROPOFOL (N/A) ESOPHAGOGASTRODUODENOSCOPY (EGD) WITH PROPOFOL (N/A)  Patient location during evaluation: PACU Anesthesia Type: General Level of consciousness: awake Pain management: satisfactory to patient Vital Signs Assessment: post-procedure vital signs reviewed and stable Respiratory status: nonlabored ventilation Cardiovascular status: stable Anesthetic complications: no     Last Vitals:  Vitals:   11/03/16 0930 11/03/16 0940  BP: 127/61 (!) 135/57  Pulse: 62 62  Resp: 18 20  Temp:      Last Pain:  Vitals:   11/03/16 0734  TempSrc: Tympanic                 VAN STAVEREN,Giankarlo Leamer

## 2016-11-03 NOTE — Anesthesia Preprocedure Evaluation (Signed)
Anesthesia Evaluation  Patient identified by MRN, date of birth, ID band Patient awake    Reviewed: Allergy & Precautions  Airway Mallampati: I       Dental  (+) Teeth Intact   Pulmonary neg pulmonary ROS,    breath sounds clear to auscultation       Cardiovascular hypertension, Pt. on medications  Rhythm:Regular     Neuro/Psych negative neurological ROS     GI/Hepatic Neg liver ROS,   Endo/Other  negative endocrine ROS  Renal/GU negative Renal ROS  negative genitourinary   Musculoskeletal   Abdominal Normal abdominal exam  (+)   Peds negative pediatric ROS (+)  Hematology  (+) anemia ,   Anesthesia Other Findings   Reproductive/Obstetrics                             Anesthesia Physical Anesthesia Plan  ASA: II  Anesthesia Plan: General   Post-op Pain Management:    Induction: Intravenous  PONV Risk Score and Plan: 0  Airway Management Planned: Natural Airway and Nasal Cannula  Additional Equipment:   Intra-op Plan:   Post-operative Plan:   Informed Consent: I have reviewed the patients History and Physical, chart, labs and discussed the procedure including the risks, benefits and alternatives for the proposed anesthesia with the patient or authorized representative who has indicated his/her understanding and acceptance.     Plan Discussed with: CRNA  Anesthesia Plan Comments:         Anesthesia Quick Evaluation

## 2016-11-03 NOTE — H&P (Signed)
Primary Care Physician:  [name], MD Primary Gastroenterologist:  Dr. Vira Agar  Pre-Procedure History & Physical: HPI:  Barbara Vega is a 83 y.o. female is here for an endoscopy and colonoscopy.   Past Medical History:  Diagnosis Date  . Adenomatous colon polyp   . Anemia    hx  . Arthritis   . Benign breast lumps   . History of colon polyps    benign  . Hypertension    takes Triamterene-HCTZ daily  . Insomnia    takes Restoril nightly  . Macular degeneration    dry  . Osteoporosis   . Sinus infection    taking Ceftin daily     Past Surgical History:  Procedure Laterality Date  . ABDOMINAL HYSTERECTOMY    . BREAST LUMPECTOMY Right 09/08/2015   intraductal papilloma  . BREAST LUMPECTOMY Left    2 bx done only one scar seen years ago neg  . cataract surgery Bilateral   . COLONOSCOPY    . RADIOACTIVE SEED GUIDED EXCISIONAL BREAST BIOPSY Right 09/08/2015   Procedure: RADIOACTIVE SEED GUIDED EXCISIONAL BREAST BIOPSY;  Surgeon: Rolm Bookbinder, MD;  Location: [facility];  Service: General;  Laterality: Right;    Prior to Admission medications   Medication Sig Start Date End Date Taking? Authorizing Provider  aspirin EC 81 MG tablet Take 81 mg by mouth daily.   Yes [provider]  calcium carbonate (OS-CAL - DOSED IN MG OF ELEMENTAL CALCIUM) 1250 (500 CA) MG tablet Take 1 tablet by mouth daily with breakfast.    Yes [provider]  cetirizine (ZYRTEC) 10 MG tablet Take 10 mg by mouth daily.   Yes [provider]  cholecalciferol (VITAMIN D) 1000 UNITS tablet Take 1,000 Units by mouth daily.   Yes [provider]  Multiple Vitamin (MULTIVITAMIN WITH MINERALS) TABS tablet Take 1 tablet by mouth daily.   Yes [provider]  temazepam (RESTORIL) 30 MG capsule Take 30 mg by mouth at bedtime.  09/13/14  Yes [provider]  triamterene-hydrochlorothiazide (MAXZIDE-25) 37.5-25 MG tablet Take 1 tablet by mouth daily.   04/02/14  Yes [provider]    Allergies as of 08/24/2016  . (No Known Allergies)    Family History  Problem Relation Age of Onset  . Osteoporosis Mother   . Heart block Brother   . Alcohol abuse Brother     Social History   Social History  . Marital status: Divorced    Spouse name: N/A  . Number of children: N/A  . Years of education: N/A   Occupational History  . Not on file.   Social History Main Topics  . Smoking status: Never Smoker  . Smokeless tobacco: Never Used  . Alcohol use No  . Drug use: No  . Sexual activity: Not on file   Other Topics Concern  . Not on file   Social History Narrative  . No narrative on file    Review of Systems: See HPI, otherwise negative ROS  Physical Exam: BP (!) 152/62   Pulse 62   Temp 97 F (36.1 C) (Tympanic)   Resp 16   Ht 5\' 2"  (1.575 m)   Wt 65.8 kg (145 lb)   SpO2 99%   BMI 26.52 kg/m  General:   Alert,  pleasant and cooperative in NAD Head:  Normocephalic and atraumatic. Neck:  Supple; no masses or thyromegaly. Lungs:  Clear throughout to auscultation.    Heart:  Regular rate and  rhythm. Abdomen:  Soft, nontender and nondistended. Normal bowel sounds, without guarding, and without rebound.   Neurologic:  Alert and  oriented x4;  grossly normal neurologically.  Impression/Plan: Barbara Vega is here for an endoscopy and colonoscopy to be performed for [facility] colon polyps and dysphagia with esophageal ring seen on barium study.  Risks, benefits, limitations, and alternatives regarding  endoscopy and colonoscopy have been reviewed with the patient.  Questions have been answered.  All parties agreeable.   Gaylyn Cheers, MD  11/03/2016, 8:18 AM

## 2016-11-03 NOTE — Anesthesia Post-op Follow-up Note (Cosign Needed)
Anesthesia QCDR form completed.        

## 2016-11-04 ENCOUNTER — Encounter: Payer: Self-pay | Admitting: Unknown Physician Specialty

## 2016-11-04 LAB — SURGICAL PATHOLOGY

## 2016-11-16 ENCOUNTER — Encounter: Payer: Self-pay | Admitting: Obstetrics and Gynecology

## 2016-11-16 ENCOUNTER — Ambulatory Visit (INDEPENDENT_AMBULATORY_CARE_PROVIDER_SITE_OTHER): Payer: Medicare Other | Admitting: Obstetrics and Gynecology

## 2016-11-16 VITALS — BP 137/85 | HR 69 | Ht 62.0 in | Wt 141.7 lb

## 2016-11-16 DIAGNOSIS — N952 Postmenopausal atrophic vaginitis: Secondary | ICD-10-CM

## 2016-11-16 DIAGNOSIS — N8110 Cystocele, unspecified: Secondary | ICD-10-CM | POA: Diagnosis not present

## 2016-11-16 MED ORDER — ESTRADIOL 2 MG VA RING: 2.0000 mg | VAGINAL_RING | VAGINAL | 4 refills | Status: DC

## 2016-11-16 NOTE — Patient Instructions (Addendum)
Pelvic Organ Prolapse Pelvic organ prolapse is the stretching, bulging, or dropping of pelvic organs into an abnormal position. It happens when the muscles and tissues that surround and support pelvic structures are stretched or weak. Pelvic organ prolapse can involve:  Vagina (vaginal prolapse).  Uterus (uterine prolapse).  Bladder (cystocele).  Rectum (rectocele).  Intestines (enterocele).  When organs other than the vagina are involved, they often bulge into the vagina or protrude from the vagina, depending on how severe the prolapse is. What are the causes? Causes of this condition include:  Pregnancy, labor, and childbirth.  Long-lasting (chronic) cough.  Chronic constipation.  Obesity.  Past pelvic surgery.  Aging. During and after menopause, a decreased production of the hormone estrogen can weaken pelvic ligaments and muscles.  Consistently lifting more than 50 lb (23 kg).  Buildup of fluid in the abdomen due to certain diseases and other conditions.  What are the signs or symptoms? Symptoms of this condition include:  Loss of bladder control when you cough, sneeze, strain, and exercise (stress incontinence). This may be worse immediately following childbirth, and it may gradually improve over time.  Feeling pressure in your pelvis or vagina. This pressure may increase when you cough or when you are having a bowel movement.  A bulge that protrudes from the opening of your vagina or against your vaginal wall. If your uterus protrudes through the opening of your vagina and rubs against your clothing, you may also experience soreness, ulcers, infection, pain, and bleeding.  Increased effort to have a bowel movement or urinate.  Pain in your low back.  Pain, discomfort, or disinterest in sexual intercourse.  Repeated bladder infections (urinary tract infections).  Difficulty inserting or inability to insert a tampon or applicator.  In some people, this  condition does not cause any symptoms. How is this diagnosed? Your health care provider may perform an internal and external vaginal and rectal exam. During the exam, you may be asked to cough and strain while you are lying down, sitting, and standing up. Your health care provider will determine if other tests are required, such as bladder function tests. How is this treated? In most cases, this condition needs to be treated only if it produces symptoms. No treatment is guaranteed to correct the prolapse or relieve the symptoms completely. Treatment may include:  Lifestyle changes, such as: ? Avoiding drinking beverages that contain caffeine. ? Increasing your intake of high-fiber foods. This can help to decrease constipation and straining during bowel movements. ? Emptying your bladder at scheduled times (bladder training therapy). This can help to reduce or avoid urinary incontinence. ? Losing weight if you are overweight or obese.  Estrogen. Estrogen may help mild prolapse by increasing the strength and tone of pelvic floor muscles.  Kegel exercises. These may help mild cases of prolapse by strengthening and tightening the muscles of the pelvic floor.  Pessary insertion. A pessary is a soft, flexible device that is placed into your vagina by your health care provider to help support the vaginal walls and keep pelvic organs in place.  Surgery. This is often the only form of treatment for severe prolapse. Different types of surgeries are available.  Follow these instructions at home:  Wear a sanitary pad or absorbent product if you have urinary incontinence.  Avoid heavy lifting and straining with exercise and work. Do not hold your breath when you perform mild to moderate lifting and exercise activities. Limit your activities as directed by your health care   provider.  Take medicines only as directed by your health care provider.  Perform Kegel exercises as directed by your health care  provider.  If you have a pessary, take care of it as directed by your health care provider. Contact a health care provider if:  Your symptoms interfere with your daily activities or sex life.  You need medicine to help with the discomfort.  You notice bleeding from the vagina that is not related to your period.  You have a fever.  You have pain or bleeding when you urinate.  You have bleeding when you have a bowel movement.  You lose urine when you have sex.  You have chronic constipation.  You have a pessary that falls out.  You have vaginal discharge that has a bad smell.  You have low abdominal pain or cramping that is unusual for you. This information is not intended to replace advice given to you by your health care provider. Make sure you discuss any questions you have with your health care provider. Document Released: 11/28/2013 Document Revised: 10/09/2015 Document Reviewed: 07/16/2013 [employer]vier Interactive Patient Education  2018 [employer]  

## 2016-11-16 NOTE — Progress Notes (Signed)
GYNECOLOGY CLINIC PROGRESS NOTE Subjective:    Barbara Vega is a 83 y.o. G3P3 female who presents for evaluation of a cystocele. Problem started several years ago. Symptoms include: prolapse of tissue with straining and discomfort: mild. Symptoms have progressed to a point and plateaued.  Burning at night in vaginal area. Denies urinary incontinence, nocturia, dysuria. Patient does report remote history of "bladder tack" ` 20 years ago.    Menstrual History: OB History    Gravida Para Term Preterm AB Living   3 3       3    SAB TAB Ectopic Multiple Live Births           3      Menarche age: 82 No LMP recorded. Patient has had a hysterectomy.    Past Medical History:  Diagnosis Date  . Adenomatous colon polyp   . Anemia    hx  . Arthritis   . Benign breast lumps   . History of colon polyps    benign  . Hypertension    takes Triamterene-HCTZ daily  . Insomnia    takes Restoril nightly  . Macular degeneration    dry  . Osteoporosis   . Sinus infection    taking Ceftin daily     Family History  Problem Relation Age of Onset  . Osteoporosis Mother   . Heart block Brother   . Alcohol abuse Brother     Past Surgical History:  Procedure Laterality Date  . ABDOMINAL HYSTERECTOMY    . BREAST LUMPECTOMY Right 09/08/2015   intraductal papilloma  . BREAST LUMPECTOMY Left    2 bx done only one scar seen years ago neg  . cataract surgery Bilateral   . COLONOSCOPY    . COLONOSCOPY WITH PROPOFOL N/A 11/03/2016   Procedure: COLONOSCOPY WITH PROPOFOL;  Surgeon: Manya Silvas, MD;  Location: [facility] ENDOSCOPY;  Service: Endoscopy;  Laterality: N/A;  . ESOPHAGOGASTRODUODENOSCOPY (EGD) WITH PROPOFOL N/A 11/03/2016   Procedure: ESOPHAGOGASTRODUODENOSCOPY (EGD) WITH PROPOFOL;  Surgeon: Manya Silvas, MD;  Location: [facility] ENDOSCOPY;  Service: Endoscopy;  Laterality: N/A;  . RADIOACTIVE SEED GUIDED EXCISIONAL BREAST BIOPSY Right 09/08/2015   Procedure: RADIOACTIVE SEED GUIDED  EXCISIONAL BREAST BIOPSY;  Surgeon: Rolm Bookbinder, MD;  Location: [place];  Service: General;  Laterality: Right;    Social History   Social History  . Marital status: Divorced    Spouse name: N/A  . Number of children: N/A  . Years of education: N/A   Occupational History  . Not on file.   Social History Main Topics  . Smoking status: Never Smoker  . Smokeless tobacco: Never Used  . Alcohol use No  . Drug use: No  . Sexual activity: Not on file   Other Topics Concern  . Not on file   Social History Narrative  . No narrative on file    Current Outpatient Prescriptions on File Prior to Visit  Medication Sig Dispense Refill  . aspirin EC 81 MG tablet Take 81 mg by mouth daily.    . calcium carbonate (OS-CAL - DOSED IN MG OF ELEMENTAL CALCIUM) 1250 (500 CA) MG tablet Take 1 tablet by mouth daily with breakfast.     . cetirizine (ZYRTEC) 10 MG tablet Take 10 mg by mouth daily.    . cholecalciferol (VITAMIN D) 1000 UNITS tablet Take 1,000 Units by mouth daily.    . Multiple Vitamin (MULTIVITAMIN WITH MINERALS) TABS tablet Take 1 tablet by mouth daily.    Marland Kitchen  temazepam (RESTORIL) 30 MG capsule Take 30 mg by mouth at bedtime.     . triamterene-hydrochlorothiazide (MAXZIDE-25) 37.5-25 MG tablet Take 1 tablet by mouth daily.      No current facility-administered medications on file prior to visit.     No Known Allergies   Review of Systems Pertinent items noted in HPI and remainder of comprehensive ROS otherwise negative.   Objective:     BP 137/85 (BP Location: Left Arm, Patient Position: Sitting, Cuff Size: Normal)   Pulse 69   Ht 5\' 2"  (1.575 m)   Wt 141 lb 11.2 oz (64.3 kg)   BMI 25.92 kg/m  BP 137/85 (BP Location: Left Arm, Patient Position: Sitting, Cuff Size: Normal)   Pulse 69   Ht 5\' 2"  (1.575 m)   Wt 141 lb 11.2 oz (64.3 kg)   BMI 25.92 kg/m  General appearance: alert and no distress Back: symmetric, no curvature. ROM normal. No CVA tenderness. Abdomen:  soft, non-tender; bowel sounds normal; no masses,  no organomegaly Pelvic:     External genitalia: normal general appearance     Urinary system: urethral meatus normal and atrophy present     Vaginal: normal without tenderness or lesions, and atrophic mucosa.  Grade 2 cystocele present. No rectocele.      Cervix: removed surgically     Adnexa: normal bimanual exam, non-palpable     Uterus: removed surgically     Rectal: Deferred Extremities: extremities normal, atraumatic, no cyanosis or edema and varicose veins noted Skin: Skin color, texture, turgor normal. No rashes or lesions  Neurologic: Grossly normal   Assessment:    The patient has a cystocele (Grade 2) Vaginal atrophy  Plan:    Discussed cystoceles/rectoceles and management options with the patient. All questions answered. Neurosurgeon distributed. Conservative approach with Kegal exercises. Initiate hormone replacement therapy per orders.  Prescribed Estring to help with atrophy as well as prolapse.  Discussed pessary vs surgical intervention.  Patient inquires into surgery as she has had this done before, however noted that on her exam, her prolapse was not significant enough that surgery would be recommended.   Follow up in 6 weeks for re-evaluation of symptoms.    Rubie Maid, MD [facility]

## 2016-11-20 ENCOUNTER — Encounter: Payer: Self-pay | Admitting: Internal Medicine

## 2016-11-20 ENCOUNTER — Observation Stay
Admission: EM | Admit: 2016-11-20 | Discharge: 2016-11-21 | Disposition: A | Discharge status: Home / Self Care | Payer: Medicare Other | Attending: Internal Medicine | Admitting: Internal Medicine

## 2016-11-20 DIAGNOSIS — I10 Essential (primary) hypertension: Secondary | ICD-10-CM | POA: Diagnosis not present

## 2016-11-20 DIAGNOSIS — Z86010 Personal history of colonic polyps: Secondary | ICD-10-CM | POA: Insufficient documentation

## 2016-11-20 DIAGNOSIS — H3530 Unspecified macular degeneration: Secondary | ICD-10-CM | POA: Insufficient documentation

## 2016-11-20 DIAGNOSIS — E871 Hypo-osmolality and hyponatremia: Secondary | ICD-10-CM | POA: Diagnosis present

## 2016-11-20 DIAGNOSIS — M810 Age-related osteoporosis without current pathological fracture: Secondary | ICD-10-CM | POA: Insufficient documentation

## 2016-11-20 DIAGNOSIS — R531 Weakness: Secondary | ICD-10-CM | POA: Diagnosis present

## 2016-11-20 DIAGNOSIS — N390 Urinary tract infection, site not specified: Secondary | ICD-10-CM | POA: Diagnosis not present

## 2016-11-20 DIAGNOSIS — G4700 Insomnia, unspecified: Secondary | ICD-10-CM | POA: Insufficient documentation

## 2016-11-20 DIAGNOSIS — E876 Hypokalemia: Secondary | ICD-10-CM | POA: Insufficient documentation

## 2016-11-20 DIAGNOSIS — R1030 Lower abdominal pain, unspecified: Secondary | ICD-10-CM | POA: Diagnosis not present

## 2016-11-20 DIAGNOSIS — K2980 Duodenitis without bleeding: Secondary | ICD-10-CM | POA: Diagnosis not present

## 2016-11-20 DIAGNOSIS — N63 Unspecified lump in unspecified breast: Secondary | ICD-10-CM | POA: Insufficient documentation

## 2016-11-20 LAB — CBC WITH DIFFERENTIAL/PLATELET
BASOS ABS: 0.1 10*3/uL (ref 0–0.1)
BASOS PCT: 1 %
EOS ABS: 0.2 10*3/uL (ref 0–0.7)
EOS PCT: 2 %
HCT: 39.9 % (ref 35.0–47.0)
Hemoglobin: 14.1 g/dL (ref 12.0–16.0)
LYMPHS PCT: 32 %
Lymphs Abs: 2.2 10*3/uL (ref 1.0–3.6)
MCH: 30.8 pg (ref 26.0–34.0)
MCHC: 35.4 g/dL (ref 32.0–36.0)
MCV: 86.9 fL (ref 80.0–100.0)
MONO ABS: 0.7 10*3/uL (ref 0.2–0.9)
Monocytes Relative: 10 %
Neutro Abs: 3.8 10*3/uL (ref 1.4–6.5)
Neutrophils Relative %: 55 %
PLATELETS: 479 10*3/uL — AB (ref 150–440)
RBC: 4.59 MIL/uL (ref 3.80–5.20)
RDW: 13.8 % (ref 11.5–14.5)
WBC: 7 10*3/uL (ref 3.6–11.0)

## 2016-11-20 LAB — COMPREHENSIVE METABOLIC PANEL
ALK PHOS: 62 U/L (ref 38–126)
ALT: 8 U/L — ABNORMAL LOW (ref 14–54)
AST: 22 U/L (ref 15–41)
Albumin: 4.4 g/dL (ref 3.5–5.0)
Anion gap: 9 (ref 5–15)
BILIRUBIN TOTAL: 1 mg/dL (ref 0.3–1.2)
BUN: 5 mg/dL — ABNORMAL LOW (ref 6–20)
CALCIUM: 9.6 mg/dL (ref 8.9–10.3)
CO2: 28 mmol/L (ref 22–32)
Chloride: 86 mmol/L — ABNORMAL LOW (ref 101–111)
Creatinine, Ser: 0.71 mg/dL (ref 0.44–1.00)
GFR calc Af Amer: >60 mL/min (ref >60)
GLUCOSE: 113 mg/dL — AB (ref 65–99)
POTASSIUM: 3.4 mmol/L — AB (ref 3.5–5.1)
Sodium: 123 mmol/L — ABNORMAL LOW (ref 135–145)
TOTAL PROTEIN: 7.4 g/dL (ref 6.5–8.1)

## 2016-11-20 LAB — OSMOLALITY, URINE: Osmolality, Ur: 114 mOsm/kg — ABNORMAL LOW (ref 300–900)

## 2016-11-20 LAB — BASIC METABOLIC PANEL
ANION GAP: 7 (ref 5–15)
BUN: 6 mg/dL (ref 6–20)
CALCIUM: 9.2 mg/dL (ref 8.9–10.3)
CO2: 27 mmol/L (ref 22–32)
Chloride: 93 mmol/L — ABNORMAL LOW (ref 101–111)
Creatinine, Ser: 0.66 mg/dL (ref 0.44–1.00)
GFR calc non Af Amer: >60 mL/min (ref >60)
GLUCOSE: 114 mg/dL — AB (ref 65–99)
POTASSIUM: 3.7 mmol/L (ref 3.5–5.1)
Sodium: 127 mmol/L — ABNORMAL LOW (ref 135–145)

## 2016-11-20 LAB — URINALYSIS, COMPLETE (UACMP) WITH MICROSCOPIC
BILIRUBIN URINE: NEGATIVE
Bacteria, UA: NONE SEEN
GLUCOSE, UA: NEGATIVE mg/dL
HGB URINE DIPSTICK: NEGATIVE
KETONES UR: NEGATIVE mg/dL
LEUKOCYTES UA: NEGATIVE
NITRITE: POSITIVE — AB
PH: 7 (ref 5.0–8.0)
PROTEIN: NEGATIVE mg/dL
SQUAMOUS EPITHELIAL / LPF: NONE SEEN
Specific Gravity, Urine: 1.004 — ABNORMAL LOW (ref 1.005–1.030)

## 2016-11-20 LAB — TROPONIN I: Troponin I: <0.03 ng/mL (ref <0.03)

## 2016-11-20 LAB — SODIUM, URINE, RANDOM: SODIUM UR: 33 mmol/L

## 2016-11-20 MED ORDER — SODIUM CHLORIDE 0.9 % IV SOLN
INTRAVENOUS | Status: DC
  Administered 2016-11-20: 16:00:00 via INTRAVENOUS

## 2016-11-20 MED ORDER — OCUVITE-LUTEIN PO TABS
1.0000 | ORAL_TABLET | Freq: Two times a day (BID) | ORAL | Status: DC
  Administered 2016-11-20 – 2016-11-21 (×2): 1 via ORAL
  Filled 2016-11-20 (×3): qty 1

## 2016-11-20 MED ORDER — CEFTRIAXONE SODIUM 1 G IJ SOLR
INTRAMUSCULAR | Status: AC
  Filled 2016-11-20: qty 10

## 2016-11-20 MED ORDER — ENOXAPARIN SODIUM 40 MG/0.4ML ~~LOC~~ SOLN
40.0000 mg | SUBCUTANEOUS | Status: DC
  Administered 2016-11-20: 40 mg via SUBCUTANEOUS
  Filled 2016-11-20: qty 0.4

## 2016-11-20 MED ORDER — TEMAZEPAM 15 MG PO CAPS
30.0000 mg | ORAL_CAPSULE | Freq: Every day | ORAL | Status: DC
  Administered 2016-11-20: 22:00:00 30 mg via ORAL
  Filled 2016-11-20: qty 2

## 2016-11-20 MED ORDER — LORATADINE 10 MG PO TABS
10.0000 mg | ORAL_TABLET | Freq: Every day | ORAL | Status: DC
  Administered 2016-11-20 – 2016-11-21 (×2): 10 mg via ORAL
  Filled 2016-11-20 (×5): qty 1

## 2016-11-20 MED ORDER — ACETAMINOPHEN 650 MG RE SUPP: 650.0000 mg | Freq: Four times a day (QID) | RECTAL | Status: DC | PRN

## 2016-11-20 MED ORDER — VITAMIN D 1000 UNITS PO TABS
1000.0000 [IU] | ORAL_TABLET | Freq: Every day | ORAL | Status: DC
Start: 2016-11-20 — End: 2016-11-21
  Administered 2016-11-20 – 2016-11-21 (×2): 1000 [IU] via ORAL
  Filled 2016-11-20 (×2): qty 1

## 2016-11-20 MED ORDER — SODIUM CHLORIDE 0.9 % IV SOLN
Freq: Once | INTRAVENOUS | Status: AC
  Administered 2016-11-20: 07:00:00 via INTRAVENOUS

## 2016-11-20 MED ORDER — ONDANSETRON HCL 4 MG/2ML IJ SOLN
INTRAMUSCULAR | Status: AC
  Administered 2016-11-20: 4 mg via INTRAVENOUS
  Filled 2016-11-20: qty 2

## 2016-11-20 MED ORDER — AMLODIPINE BESYLATE 5 MG PO TABS
5.0000 mg | ORAL_TABLET | Freq: Every day | ORAL | Status: DC
  Administered 2016-11-20: 16:00:00 5 mg via ORAL
  Filled 2016-11-20: qty 1

## 2016-11-20 MED ORDER — DEXTROSE 5 % IV SOLN
1.0000 g | INTRAVENOUS | Status: DC
  Administered 2016-11-20 – 2016-11-21 (×2): 1 g via INTRAVENOUS
  Filled 2016-11-20: qty 10

## 2016-11-20 MED ORDER — ONDANSETRON HCL 4 MG/2ML IJ SOLN
4.0000 mg | Freq: Once | INTRAMUSCULAR | Status: AC
  Administered 2016-11-20: 4 mg via INTRAVENOUS

## 2016-11-20 MED ORDER — ACETAMINOPHEN 325 MG PO TABS
650.0000 mg | ORAL_TABLET | Freq: Four times a day (QID) | ORAL | Status: DC | PRN
  Administered 2016-11-20: 650 mg via ORAL
  Filled 2016-11-20: qty 2

## 2016-11-20 MED ORDER — PANTOPRAZOLE SODIUM 40 MG PO TBEC
40.0000 mg | DELAYED_RELEASE_TABLET | Freq: Every day | ORAL | Status: DC
  Administered 2016-11-20 – 2016-11-21 (×2): 40 mg via ORAL
  Filled 2016-11-20 (×2): qty 1

## 2016-11-20 MED ORDER — POTASSIUM CHLORIDE CRYS ER 20 MEQ PO TBCR
40.0000 meq | EXTENDED_RELEASE_TABLET | Freq: Once | ORAL | Status: AC
  Administered 2016-11-20: 40 meq via ORAL
  Filled 2016-11-20: qty 2

## 2016-11-20 NOTE — H&P (Signed)
[facility] [place] NAME: Barbara Vega    MR#:  [id number]  DATE OF BIRTH:  1934-04-21  DATE OF ADMISSION:  11/20/2016  PRIMARY CARE PHYSICIAN: [name], MD   REQUESTING/REFERRING PHYSICIAN: Dr Cephas Darby  CHIEF COMPLAINT:   Chief Complaint  Patient presents with  . Back Pain  . Urinary Tract Infection    HISTORY OF PRESENT ILLNESS:  Barbara Vega  is a 83 y.o. female with a known history of Recent urinary tract infection presents with lower abdominal pain and burning on urination. In the ER she was also found to have a low sodium of 123. Patient does complain of some weakness and poor appetite. States she does drink 60 mL of water per day. She has had some weight loss. Of note she did have a recent endoscopy and colonoscopy on 11/03/2016. She had an esophageal dilation at that time and colon polyps removed. The patient does take Dyazide diuretic also. Hospitalist services were contacted for further evaluation.  PAST MEDICAL HISTORY:   Past Medical History:  Diagnosis Date  . Adenomatous colon polyp   . Anemia    hx  . Arthritis   . Benign breast lumps   . History of colon polyps    benign  . Hypertension    takes Triamterene-HCTZ daily  . Insomnia    takes Restoril nightly  . Macular degeneration    dry  . Osteoporosis   . Sinus infection    taking Ceftin daily     PAST SURGICAL HISTORY:   Past Surgical History:  Procedure Laterality Date  . ABDOMINAL HYSTERECTOMY    . BREAST LUMPECTOMY Right 09/08/2015   intraductal papilloma  . BREAST LUMPECTOMY Left    2 bx done only one scar seen years ago neg  . cataract surgery Bilateral   . COLONOSCOPY    . COLONOSCOPY WITH PROPOFOL N/A 11/03/2016   Procedure: COLONOSCOPY WITH PROPOFOL;  Surgeon: Manya Silvas, MD;  Location: [facility] ENDOSCOPY;  Service: Endoscopy;  Laterality: N/A;  . ESOPHAGOGASTRODUODENOSCOPY (EGD) WITH PROPOFOL N/A 11/03/2016   Procedure: ESOPHAGOGASTRODUODENOSCOPY (EGD) WITH PROPOFOL;  Surgeon: Manya Silvas, MD;  Location: [facility] ENDOSCOPY;  Service: Endoscopy;  Laterality: N/A;  . RADIOACTIVE SEED GUIDED EXCISIONAL BREAST BIOPSY Right 09/08/2015   Procedure: RADIOACTIVE SEED GUIDED EXCISIONAL BREAST BIOPSY;  Surgeon: Rolm Bookbinder, MD;  Location: [place];  Service: General;  Laterality: Right;    SOCIAL HISTORY:   Social History  Substance Use Topics  . Smoking status: Never Smoker  . Smokeless tobacco: Never Used  . Alcohol use No    FAMILY HISTORY:   Family History  Problem Relation Age of Onset  . Osteoporosis Mother   . Heart failure Mother   . Heart block Brother   . Alcohol abuse Brother     DRUG ALLERGIES:  No Known Allergies  REVIEW OF SYSTEMS:  CONSTITUTIONAL: No fever. Cold feeling. Positive for weight loss. Some fatigue.  EYES: No blurred or double vision. Wears glasses EARS, NOSE, AND THROAT: No tinnitus or ear pain. Positive for runny nose and sore throat RESPIRATORY: Occasional cough. No shortness of breath, wheezing or hemoptysis.  CARDIOVASCULAR: No chest pain, orthopnea, edema.  GASTROINTESTINAL: No nausea, vomiting, diarrhea or constipation. Occasional lower abdominal pain. No blood in bowel movements GENITOURINARY: Positive for dysuria. No hematuria.  ENDOCRINE: No polyuria, nocturia,  HEMATOLOGY: No anemia, easy bruising or bleeding SKIN: No rash or lesion. MUSCULOSKELETAL: Positive for joint pain.  NEUROLOGIC: No tingling, numbness, weakness.  PSYCHIATRY: No anxiety or depression.   MEDICATIONS AT HOME:   Prior to Admission medications   Medication Sig Start Date End Date Taking? Authorizing Provider  aspirin EC 81 MG tablet Take 81 mg by mouth daily.   Yes [provider]  cetirizine (ZYRTEC) 10 MG tablet Take 10 mg by mouth daily as needed.    Yes [provider]  cholecalciferol (VITAMIN D) 1000 UNITS tablet Take 1,000 Units by mouth daily.    Yes [provider]  esomeprazole (NEXIUM) 20 MG capsule Take 20 mg by mouth daily as needed.    Yes [provider]  KRILL OIL PO Take 1 tablet by mouth daily.    Yes [provider]  Multiple Vitamins-Minerals (PRESERVISION AREDS 2 PO) Take 1 tablet by mouth 2 (two) times daily.   Yes [provider]  nitrofurantoin, macrocrystal-monohydrate, (MACROBID) 100 MG capsule Take 100 mg by mouth 2 (two) times daily. 11/17/16 11/21/16 Yes [provider]  phenazopyridine (PYRIDIUM) 97 MG tablet Take 97 mg by mouth 3 (three) times daily as needed.    Yes [provider]  temazepam (RESTORIL) 30 MG capsule Take 30 mg by mouth at bedtime.  09/13/14  Yes [provider]  triamterene-hydrochlorothiazide (MAXZIDE-25) 37.5-25 MG tablet Take 1 tablet by mouth daily.  04/02/14  Yes [provider]  calcium carbonate (OS-CAL - DOSED IN MG OF ELEMENTAL CALCIUM) 1250 (500 CA) MG tablet Take 1 tablet by mouth daily with breakfast.     [provider]  diclofenac sodium (VOLTAREN) 1 % GEL Apply topically. 09/01/16 09/01/17  [provider]  diclofenac sodium (VOLTAREN) 1 % GEL  09/01/16   [provider]  estradiol (ESTRING) 2 MG vaginal ring Place 2 mg vaginally every 3 (three) months. follow package directions Patient not taking: Reported on 11/20/2016 11/16/16   Rubie Maid, MD  Multiple Vitamin (MULTIVITAMIN WITH MINERALS) TABS tablet Take 1 tablet by mouth daily.    [provider]  polyethylene glycol-electrolytes (NULYTELY/GOLYTELY) 420 g solution  08/23/16   [provider]    Medication reconciliation still undergoing  VITAL SIGNS:  Blood pressure 107/78, pulse 76, temperature 98 F (36.7 C), temperature source Oral, resp. rate 16, height 5\' 2"  (1.575 m), weight 61.2 kg (135 lb), SpO2 95 %.  PHYSICAL EXAMINATION:  GENERAL:  83 y.o.-year-old patient lying in the bed with no acute distress.  EYES:  Pupils equal, round, reactive to light and accommodation. No scleral icterus. Extraocular muscles intact.  HEENT: Head atraumatic, normocephalic. Oropharynx and nasopharynx clear.  NECK:  Supple, no jugular venous distention. No thyroid enlargement, no tenderness.  LUNGS: Normal breath sounds bilaterally, no wheezing, rales,rhonchi or crepitation. No use of accessory muscles of respiration.  CARDIOVASCULAR: S1, S2 normal. No murmurs, rubs, or gallops.  ABDOMEN: Soft, Some suprapubic discomfort, nondistended. Bowel sounds present. No organomegaly or mass.  EXTREMITIES: No pedal edema, cyanosis, or clubbing.  NEUROLOGIC: Cranial nerves II through XII are intact. Muscle strength 5/5 in all extremities. Sensation intact. Gait not checked.  PSYCHIATRIC: The patient is alert and oriented x 3.  SKIN: No rash, lesion, or ulcer.   LABORATORY PANEL:   CBC  Recent Labs Lab 11/20/16 0630  WBC 7.0  HGB 14.1  HCT 39.9  PLT 479*   ------------------------------------------------------------------------------------------------------------------  Chemistries   Recent Labs Lab 11/20/16 0630  NA 123*  K 3.4*  CL 86*  CO2 28  GLUCOSE 113*  BUN 5*  CREATININE 0.71  CALCIUM 9.6  AST 22  ALT 8*  ALKPHOS 62  BILITOT 1.0   ------------------------------------------------------------------------------------------------------------------  Cardiac Enzymes  Recent Labs Lab 11/20/16 0630  TROPONINI <0.03   ------------------------------------------------------------------------------------------------------------------   IMPRESSION AND PLAN:   1. Hyponatremia. We'll give gentle IV fluid hydration and hold hydrochlorothiazide and monitor closely. Could have been worsened with colonoscopy prep, poor appetite and hydrochlorothiazide. Send off sodium in urine and urine osmolarity. 2. Hypokalemia replace potassium orally 3. Essential hypertension. Watch blood pressure off Dyazide at this  point. 4. Partially treated urinary tract infection. Switch nitrofurantoin over to Rocephin and send off a urine culture. 5. History of macular degeneration 6. Insomnia on temazepam 7. Recent esophageal stretching procedure and duodenitis seen on EGD. Place on PPI   All the records are reviewed and case discussed with ED provider. Management plans discussed with the patient, family and they are in agreement.  CODE STATUS: Full code  TOTAL TIME TAKING CARE OF THIS PATIENT: 50 minutes.    Loletha Grayer M.D on 11/20/2016 at 8:27 AM  Between 7am to 6pm - Pager - [phone]  After 6pm call admission pager [phone]  [facility] Office  [phone]  CC: Primary care physician; [name], MD

## 2016-11-20 NOTE — ED Notes (Signed)
Patient reports having dysuria for several days.  Was seen at local urgent care on Wednesday and diagnosed with UTI.  Reports prescribed antibiotics and azo.  Patient reports feeling no better and that she now has left flank pain.

## 2016-11-20 NOTE — ED Provider Notes (Addendum)
[facility] Emergency Department Provider Note       Time seen: ----------------------------------------- 7:46 AM on 11/20/2016 -----------------------------------------     I have reviewed the triage vital signs and the nursing notes.   HISTORY   Chief Complaint Back Pain and Urinary Tract Infection    HPI Barbara Vega is a 83 y.o. female who presents to the ED for general ill feeling. Patient was seen by her doctor on Wednesday and started on antibiotics for UTI. Patient states she has burning with urination, constant back pain and headache. Patient was scrubbed Macrobid and has been taking Azo without relief. She denies fevers, chills or other complaints. Patient does report she drinks a lot of water.   Past Medical History:  Diagnosis Date  . Adenomatous colon polyp   . Anemia    hx  . Arthritis   . Benign breast lumps   . History of colon polyps    benign  . Hypertension    takes Triamterene-HCTZ daily  . Insomnia    takes Restoril nightly  . Macular degeneration    dry  . Osteoporosis   . Sinus infection    taking Ceftin daily     Patient Active Problem List   Diagnosis Date Noted  . Papilloma of breast 04/03/2015  . Osteoporosis, post-menopausal 03/04/2015  . Cannot sleep 03/04/2015  . BP (high blood pressure) 03/04/2015  . Adenomatous colon polyp 03/04/2015    Past Surgical History:  Procedure Laterality Date  . ABDOMINAL HYSTERECTOMY    . BREAST LUMPECTOMY Right 09/08/2015   intraductal papilloma  . BREAST LUMPECTOMY Left    2 bx done only one scar seen years ago neg  . cataract surgery Bilateral   . COLONOSCOPY    . COLONOSCOPY WITH PROPOFOL N/A 11/03/2016   Procedure: COLONOSCOPY WITH PROPOFOL;  Surgeon: Manya Silvas, MD;  Location: [facility] ENDOSCOPY;  Service: Endoscopy;  Laterality: N/A;  . ESOPHAGOGASTRODUODENOSCOPY (EGD) WITH PROPOFOL N/A 11/03/2016   Procedure: ESOPHAGOGASTRODUODENOSCOPY (EGD) WITH PROPOFOL;   Surgeon: Manya Silvas, MD;  Location: [facility] ENDOSCOPY;  Service: Endoscopy;  Laterality: N/A;  . RADIOACTIVE SEED GUIDED EXCISIONAL BREAST BIOPSY Right 09/08/2015   Procedure: RADIOACTIVE SEED GUIDED EXCISIONAL BREAST BIOPSY;  Surgeon: Rolm Bookbinder, MD;  Location: [place];  Service: General;  Laterality: Right;    Allergies Patient has no known allergies.  Social History Social History  Substance Use Topics  . Smoking status: Never Smoker  . Smokeless tobacco: Never Used  . Alcohol use No    Review of Systems Constitutional: Negative for fever. Cardiovascular: Negative for chest pain. Respiratory: Negative for shortness of breath. Gastrointestinal: Negative for abdominal pain, vomiting and diarrhea. Genitourinary: Positive for dysuria Musculoskeletal: Positive for back pain Skin: Negative for rash. Neurological: Positive for headache, generalized weakness  All systems negative/normal/unremarkable except as stated in the HPI  ____________________________________________   PHYSICAL EXAM:  VITAL SIGNS: ED Triage Vitals  Enc Vitals Group     BP 11/20/16 0625 107/78     Pulse Rate 11/20/16 0625 76     Resp 11/20/16 0625 16     Temp 11/20/16 0625 98 F (36.7 C)     Temp Source 11/20/16 0625 Oral     SpO2 11/20/16 0625 95 %     Weight 11/20/16 0625 135 lb (61.2 kg)     Height 11/20/16 0625 5\' 2"  (1.575 m)     Head Circumference --      Peak Flow --  Pain Score 11/20/16 0624 9     Pain Loc --      Pain Edu? --      Excl. in [place]? --     Constitutional: Alert and oriented. Well appearing and in no distress. Eyes: Conjunctivae are normal. Normal extraocular movements. ENT   Head: Normocephalic and atraumatic.   Nose: No congestion/rhinnorhea.   Mouth/Throat: Mucous membranes are moist.   Neck: No stridor. Cardiovascular: Normal rate, regular rhythm. No murmurs, rubs, or gallops. Respiratory: Normal respiratory effort without tachypnea nor  retractions. Breath sounds are clear and equal bilaterally. No wheezes/rales/rhonchi. Gastrointestinal: Soft and nontender. Normal bowel sounds Musculoskeletal: Nontender with normal range of motion in extremities. No lower extremity tenderness nor edema. Neurologic:  Normal speech and language. No gross focal neurologic deficits are appreciated.  Skin:  Skin is warm, dry and intact. No rash noted. Psychiatric: Mood and affect are normal. Speech and behavior are normal.  ____________________________________________  ED COURSE:  Pertinent labs & imaging results that were available during my care of the patient were reviewed by me and considered in my medical decision making (see chart for details). Patient presents for persistent UTI symptoms, we will assess with labs and imaging as indicated.   Procedures ____________________________________________   LABS (pertinent positives/negatives)  Labs Reviewed  COMPREHENSIVE METABOLIC PANEL - Abnormal; Notable for the following:       Result Value   Sodium 123 (*)    Potassium 3.4 (*)    Chloride 86 (*)    Glucose, Bld 113 (*)    BUN 5 (*)    ALT 8 (*)    All other components within normal limits  CBC WITH DIFFERENTIAL/PLATELET - Abnormal; Notable for the following:    Platelets 479 (*)    All other components within normal limits  URINALYSIS, COMPLETE (UACMP) WITH MICROSCOPIC - Abnormal; Notable for the following:    Color, Urine AMBER (*)    APPearance CLEAR (*)    Specific Gravity, Urine 1.004 (*)    Nitrite POSITIVE (*)    All other components within normal limits  TROPONIN I   ____________________________________________  FINAL ASSESSMENT AND PLAN  Weakness, hyponatremia  Plan: Patient's labs and imaging were dictated above. Patient had presented for Weakness and general ill feeling. She had been started on treatment with Macrobid for UTI. She was found to have hyponatremia here with a sodium of 123. I started her on a  saline infusion as well as given Zofran. Hyponatremia is likely from her blood pressure medication as well as potentially excessive water intake. She is stable for admission at this time.   Earleen Newport, MD   Note: This note was generated in part or whole with voice recognition software. Voice recognition is usually quite accurate but there are transcription errors that can and very often do occur. I apologize for any typographical errors that were not detected and corrected.     Earleen Newport, MD 11/20/16 6761    Earleen Newport, MD 11/20/16 [phone]

## 2016-11-20 NOTE — ED Triage Notes (Signed)
Pt states was seen by PMD on Wednesday for urinary tract infection. Pt states she has burning with urination, constant back pain and a headache. Pt states was prescribed macrobid and has been taking AZO without relief. Skin normal color, warm and dry.

## 2016-11-20 NOTE — Progress Notes (Signed)
Patient is from home alone- uses no equipment. One assist out of bed due to weakness. IVF bolus for low Na & IV Abx given in ED. VSS. Will continue to monitor.

## 2016-11-21 LAB — BASIC METABOLIC PANEL
ANION GAP: 7 (ref 5–15)
BUN: 7 mg/dL (ref 6–20)
CALCIUM: 9.2 mg/dL (ref 8.9–10.3)
CO2: 28 mmol/L (ref 22–32)
CREATININE: 0.78 mg/dL (ref 0.44–1.00)
Chloride: 98 mmol/L — ABNORMAL LOW (ref 101–111)
GFR calc non Af Amer: >60 mL/min (ref >60)
Glucose, Bld: 99 mg/dL (ref 65–99)
Potassium: 3.4 mmol/L — ABNORMAL LOW (ref 3.5–5.1)
Sodium: 133 mmol/L — ABNORMAL LOW (ref 135–145)

## 2016-11-21 LAB — URINE CULTURE: SPECIAL REQUESTS: NORMAL

## 2016-11-21 MED ORDER — CEPHALEXIN 250 MG PO CAPS: 250.0000 mg | ORAL_CAPSULE | Freq: Three times a day (TID) | ORAL | Status: DC

## 2016-11-21 MED ORDER — CEPHALEXIN 250 MG PO CAPS: 250.0000 mg | ORAL_CAPSULE | Freq: Three times a day (TID) | ORAL | 0 refills | Status: DC

## 2016-11-21 MED ORDER — POTASSIUM CHLORIDE CRYS ER 20 MEQ PO TBCR
40.0000 meq | EXTENDED_RELEASE_TABLET | Freq: Once | ORAL | Status: AC
  Administered 2016-11-21: 12:00:00 40 meq via ORAL
  Filled 2016-11-21: qty 2

## 2016-11-21 MED ORDER — AMLODIPINE BESYLATE 5 MG PO TABS
2.5000 mg | ORAL_TABLET | Freq: Every day | ORAL | Status: DC
  Administered 2016-11-21: 2.5 mg via ORAL
  Filled 2016-11-21: qty 1

## 2016-11-21 MED ORDER — POTASSIUM CHLORIDE ER 10 MEQ PO TBCR: EXTENDED_RELEASE_TABLET | ORAL | 0 refills | Status: DC

## 2016-11-21 NOTE — Care Management CC44 (Signed)
Condition Code 44 Documentation Completed  Patient Details  Name: BROOKLYNNE PEREIDA MRN: [id number] Date of Birth: [birth year]   Condition Code 44 given:  Yes Patient signature on Condition Code 44 notice:  Yes Documentation of 2 MD's agreement:  Yes Code 44 added to claim:  Yes    Ival Bible, RN 11/21/2016, 9:51 AM

## 2016-11-21 NOTE — Discharge Summary (Signed)
[facility] NAME: Barbara Vega    MR#:  [id number]  DATE OF BIRTH:  [birth year]  DATE OF ADMISSION:  11/20/2016 ADMITTING PHYSICIAN: Loletha Grayer, MD  DATE OF DISCHARGE: 11/21/2016  PRIMARY CARE PHYSICIAN: [name], MD    ADMISSION DIAGNOSIS:  Hyponatremia [E87.1] Weakness [R53.1]  DISCHARGE DIAGNOSIS:  Active Problems:   Hyponatremia   SECONDARY DIAGNOSIS:   Past Medical History:  Diagnosis Date  . Adenomatous colon polyp   . Anemia    hx  . Arthritis   . Benign breast lumps   . History of colon polyps    benign  . Hypertension    takes Triamterene-HCTZ daily  . Insomnia    takes Restoril nightly  . Macular degeneration    dry  . Osteoporosis   . Sinus infection    taking Ceftin daily     HOSPITAL COURSE:   1.  Hyponatremia. Patient was admitted yesterday with a sodium of 123. The patient was given IV fluids and hydrochlorothiazide was held. Sodium came up to 127 yesterday afternoon and up to 133 this morning. I would continue to not give hydrochlorothiazide at this point. I think this was likely a combination from recent colonoscopy prep, not eating well and hydrochlorothiazide. Follow up next week with PMD for checking electrolytes. 2. Hypokalemia. I will replace potassium orally again today and 3 days upon discharge 3. Essential hypertension. Watch blood pressure off Dyazide at this point. I was considering prescribing Norvasc but diastolic blood pressure on the lower side and rather have her be a little higher than too low. 4. Partially treated urinary tract infection. I discontinued nitrofurantoin and gave 2 doses of Rocephin while here in the hospital. Urine culture still pending at this point. Keflex upon going home 5. History of macular degeneration on PreserVision 6. Insomnia on temazepam 7. Recent esophageal stretching procedure and duodenitis seen on EGD on PPI  DISCHARGE CONDITIONS:    Satisfactory  CONSULTS OBTAINED:   none  DRUG ALLERGIES:  No Known Allergies  DISCHARGE MEDICATIONS:   Current Discharge Medication List    START taking these medications   Details  cephALEXin (KEFLEX) 250 MG capsule Take 1 capsule (250 mg total) by mouth every 8 (eight) hours. Qty: 9 capsule, Refills: 0    potassium chloride (K-DUR) 10 MEQ tablet One tab daily for three days Qty: 3 tablet, Refills: 0      CONTINUE these medications which have NOT CHANGED   Details  aspirin EC 81 MG tablet Take 81 mg by mouth daily.    cetirizine (ZYRTEC) 10 MG tablet Take 10 mg by mouth daily as needed.     cholecalciferol (VITAMIN D) 1000 UNITS tablet Take 1,000 Units by mouth daily.    esomeprazole (NEXIUM) 20 MG capsule Take 20 mg by mouth daily as needed.     KRILL OIL PO Take 1 tablet by mouth daily.     Multiple Vitamins-Minerals (PRESERVISION AREDS 2 PO) Take 1 tablet by mouth 2 (two) times daily.    temazepam (RESTORIL) 30 MG capsule Take 30 mg by mouth at bedtime.     calcium carbonate (OS-CAL - DOSED IN MG OF ELEMENTAL CALCIUM) 1250 (500 CA) MG tablet Take 1 tablet by mouth daily with breakfast.     diclofenac sodium (VOLTAREN) 1 % GEL Apply topically.      STOP taking these medications     nitrofurantoin, macrocrystal-monohydrate, (MACROBID) 100 MG capsule  phenazopyridine (PYRIDIUM) 97 MG tablet      triamterene-hydrochlorothiazide (MAXZIDE-25) 37.5-25 MG tablet          DISCHARGE INSTRUCTIONS:   Follow-up PMD one week  If you experience worsening of your admission symptoms, develop shortness of breath, life threatening emergency, suicidal or homicidal thoughts you must seek medical attention immediately by calling 911 or calling your MD immediately  if symptoms less severe.  You Must read complete instructions/literature along with all the possible adverse reactions/side effects for all the Medicines you take and that have been prescribed to you. Take  any new Medicines after you have completely understood and accept all the possible adverse reactions/side effects.   Please note  You were cared for by a hospitalist during your hospital stay. If you have any questions about your discharge medications or the care you received while you were in the hospital after you are discharged, you can call the unit and asked to speak with the hospitalist on call if the hospitalist that took care of you is not available. Once you are discharged, your primary care physician will handle any further medical issues. Please note that NO REFILLS for any discharge medications will be authorized once you are discharged, as it is imperative that you return to your primary care physician (or establish a relationship with a primary care physician if you do not have one) for your aftercare needs so that they can reassess your need for medications and monitor your lab values.    Today   CHIEF COMPLAINT:   Chief Complaint  Patient presents with  . Back Pain  . Urinary Tract Infection    HISTORY OF PRESENT ILLNESS:  Barbara Vega  is a 83 y.o. female presented with back pain and lower abdominal pain and urinary tract infection. Patient found to have hyponatremia.   VITAL SIGNS:  Blood pressure (!) 134/54, pulse 69, temperature 97.8 F (36.6 C), temperature source Oral, resp. rate 18, height 5\' 2"  (1.575 m), weight 67.4 kg (148 lb 8 oz), SpO2 97 %.    PHYSICAL EXAMINATION:  GENERAL:  82 y.o.-year-old patient lying in the bed with no acute distress.  EYES: Pupils equal, round, reactive to light and accommodation. No scleral icterus. Extraocular muscles intact.  HEENT: Head atraumatic, normocephalic. Oropharynx and nasopharynx clear.  NECK:  Supple, no jugular venous distention. No thyroid enlargement, no tenderness.  LUNGS: Normal breath sounds bilaterally, no wheezing, rales,rhonchi or crepitation. No use of accessory muscles of respiration.  CARDIOVASCULAR: S1,  S2 normal. No murmurs, rubs, or gallops.  ABDOMEN: Soft, non-tender, non-distended. Bowel sounds present. No organomegaly or mass.  EXTREMITIES: No pedal edema, cyanosis, or clubbing.  NEUROLOGIC: Cranial nerves II through XII are intact. Muscle strength 5/5 in all extremities. Sensation intact. Gait not checked.  PSYCHIATRIC: The patient is alert and oriented x 3.  SKIN: No obvious rash, lesion, or ulcer.   DATA REVIEW:   CBC  Recent Labs Lab 11/20/16 0630  WBC 7.0  HGB 14.1  HCT 39.9  PLT 479*    Chemistries   Recent Labs Lab 11/20/16 0630  11/21/16 0752  NA 123*  < > 133*  K 3.4*  < > 3.4*  CL 86*  < > 98*  CO2 28  < > 28  GLUCOSE 113*  < > 99  BUN 5*  < > 7  CREATININE 0.71  < > 0.78  CALCIUM 9.6  < > 9.2  AST 22  --   --  ALT 8*  --   --   ALKPHOS 62  --   --   BILITOT 1.0  --   --   < > = values in this interval not displayed.  Cardiac Enzymes  Recent Labs Lab 11/20/16 0630  TROPONINI <0.03     Management plans discussed with the patient, And she is in agreement.  CODE STATUS:     Code Status Orders        Start     Ordered   11/20/16 0825  Full code  Continuous     11/20/16 0824    Code Status History    Date Active Date Inactive Code Status Order ID Comments User Context   This patient has a current code status but no historical code status.    Advance Directive Documentation     Most Recent Value  Type of Advance Directive  Healthcare Power of [place], Living will [POA- Marchia Bond and Herbie [place]]  Pre-existing out of facility DNR order (yellow form or pink MOST form)  -  "MOST" Form in Place?  -      TOTAL TIME TAKING CARE OF THIS PATIENT: 35 minutes.    Loletha Grayer M.D on 11/21/2016 at 11:49 AM  Between 7am to 6pm - Pager - [phone]  After 6pm go to [email or web address] - password [email or web address] [facility]  [phone]  CC: Primary care physician; [name], MD

## 2016-11-21 NOTE — Care Management CC44 (Signed)
Condition Code 44 Documentation Completed  Patient Details  Name: SHAMEKA AGGARWAL MRN: [id number] Date of Birth: Mar 20, 1934   Condition Code 44 given:  Yes Patient signature on Condition Code 44 notice:  Yes Documentation of 2 MD's agreement:  Yes Code 44 added to claim:  Yes    Lateefa Crosby A, RN 11/21/2016, 9:46 AM

## 2016-11-21 NOTE — Care Management Obs Status (Signed)
[place] NOTIFICATION   Patient Details  Name: Barbara Vega MRN: [id number] Date of Birth: November 15, 1933   Medicare Observation Status Notification Given:  Yes Manson Allan letter)    Mardene Speak, RN 11/21/2016, 9:46 AM

## 2016-11-21 NOTE — Progress Notes (Signed)
Nutrition Brief Note  Patient identified on the Malnutrition Screening Tool (MST) Report  Wt Readings from Last 15 Encounters:  11/20/16 148 lb 8 oz (67.4 kg)  11/16/16 141 lb 11.2 oz (64.3 kg)  11/03/16 145 lb (65.8 kg)  09/08/15 151 lb (68.5 kg)  09/03/15 151 lb 6.4 oz (68.7 kg)  05/08/15 149 lb 11.2 oz (67.9 kg)  03/05/15 150 lb (68 kg)   Spoke with patient at bedside. She reports her appetite is good and she is eating well. She reports she eats 3 meals per day and usually finishes all of her meals at home. Reports her partial dentures are at home so she has had some difficulty chewing hard foods while here, but besides that reports she has eaten well. Used to have difficulty swallowing, but after recent esophageal dilation does not have any trouble with swallowing anymore. Patient reports her UBW is around 145 lbs and she does not think she has been losing weight. She drinks strawberry Carn[employer] Instant Breakfast regularly at home.   Unable to complete nutrition-focused physical exam as patient was already dressed in her clothes ready to leave. Has order to discharge home today.  Patient does not meet criteria for malnutrition at this time.  Body mass index is 27.16 kg/m. Patient meets criteria for overweight based on current BMI.   Current diet order is Regular, patient is consuming approximately 80-90% of meals today (yesterday was bites-15%). Labs and medications reviewed.   No nutrition interventions warranted at this time. If nutrition issues arise, please consult RD.   Willey Blade, MS, RD, LDN Pager: [phone] After Hours Pager: [phone]

## 2016-11-21 NOTE — Progress Notes (Signed)
Pt is being discharged home. Discharge papers given and explained to pt. Pt verbalized understanding. F/u appointments and meds reviewed with pt. RX given. Awaiting transportation.

## 2016-12-07 ENCOUNTER — Other Ambulatory Visit: Payer: Self-pay | Admitting: Family Medicine

## 2016-12-07 DIAGNOSIS — R1031 Right lower quadrant pain: Secondary | ICD-10-CM

## 2016-12-09 ENCOUNTER — Ambulatory Visit
Admission: RE | Admit: 2016-12-09 | Discharge: 2016-12-09 | Disposition: A | Discharge status: Home / Self Care | Payer: Medicare Other | Source: Ambulatory Visit | Attending: Family Medicine | Admitting: Family Medicine

## 2016-12-09 DIAGNOSIS — R1013 Epigastric pain: Secondary | ICD-10-CM | POA: Insufficient documentation

## 2016-12-09 DIAGNOSIS — R1031 Right lower quadrant pain: Secondary | ICD-10-CM

## 2016-12-10 ENCOUNTER — Telehealth: Payer: Self-pay | Admitting: Obstetrics and Gynecology

## 2016-12-10 ENCOUNTER — Other Ambulatory Visit: Payer: Self-pay

## 2016-12-10 DIAGNOSIS — N8110 Cystocele, unspecified: Secondary | ICD-10-CM

## 2016-12-10 NOTE — Telephone Encounter (Signed)
Patient lvm stating that she might possibly need her bladder tacked up. Patient would like to speak with Charlott Rakes to discuss her options of her bladder issues. Please advise.

## 2016-12-10 NOTE — Telephone Encounter (Signed)
Called pt she desire referral to Urology, referral placed

## 2016-12-10 NOTE — Telephone Encounter (Signed)
Called pt she states that she wants a bladder tact, informed pt that this is not a service that Dr.Cherry performs offered pt to see Dr.Evans or see female provider at Urology. Pt states that she prefers urology. Will place referral.

## 2016-12-20 ENCOUNTER — Encounter: Payer: Self-pay | Admitting: Urology

## 2016-12-20 ENCOUNTER — Ambulatory Visit (INDEPENDENT_AMBULATORY_CARE_PROVIDER_SITE_OTHER): Payer: Medicare Other | Admitting: Urology

## 2016-12-20 VITALS — BP 123/77 | HR 86 | Ht 62.0 in | Wt 143.7 lb

## 2016-12-20 DIAGNOSIS — N8110 Cystocele, unspecified: Secondary | ICD-10-CM

## 2016-12-20 DIAGNOSIS — N8111 Cystocele, midline: Secondary | ICD-10-CM | POA: Diagnosis not present

## 2016-12-20 LAB — URINALYSIS, COMPLETE
Bilirubin, UA: NEGATIVE
Glucose, UA: NEGATIVE
Ketones, UA: NEGATIVE
Nitrite, UA: NEGATIVE
PH UA: 7 (ref 5.0–7.5)
Protein, UA: NEGATIVE
SPEC GRAV UA: 1.015 (ref 1.005–1.030)
Urobilinogen, Ur: 0.2 mg/dL (ref 0.2–1.0)

## 2016-12-20 LAB — MICROSCOPIC EXAMINATION

## 2016-12-20 NOTE — Progress Notes (Signed)
12/20/2016 1:36 PM   Barbara Vega 03/15/1934 [id number]  Referring provider: [name], MD [address] [facility] [place], [place] [address]  Chief Complaint  Patient presents with  . New Patient (Initial Visit)    Bladder prolapse    HPI: I was consulted to assess the patient's prolapse that has been pretty consistent over 3 years. She feels a little bit of bulging. She does no splinting. It's not daily. Sometimes her burns. She is continent and can void every 3 or 4 hours and has minimal nocturia. She hasn't frequent bladder infections.  She has had a hysterectomy. She has not had previous GU surgery. She does not have neurologic issues.  Bowel function is normal  Modifying factors: There are no other modifying factors  Associated signs and symptoms: There are no other associated signs and symptoms Aggravating and relieving factors: There are no other aggravating or relieving factors Severity: Moderate Duration: Persistent     PMH: Past Medical History:  Diagnosis Date  . Adenomatous colon polyp   . Anemia    hx  . Arthritis   . Benign breast lumps   . History of colon polyps    benign  . Hypertension    takes Triamterene-HCTZ daily  . Insomnia    takes Restoril nightly  . Macular degeneration    dry  . Osteoporosis   . Sinus infection    taking Ceftin daily     Surgical History: Past Surgical History:  Procedure Laterality Date  . ABDOMINAL HYSTERECTOMY    . BREAST LUMPECTOMY Right 09/08/2015   intraductal papilloma  . BREAST LUMPECTOMY Left    2 bx done only one scar seen years ago neg  . cataract surgery Bilateral   . COLONOSCOPY    . COLONOSCOPY WITH PROPOFOL N/A 11/03/2016   Procedure: COLONOSCOPY WITH PROPOFOL;  Surgeon: Manya Silvas, MD;  Location: [facility] ENDOSCOPY;  Service: Endoscopy;  Laterality: N/A;  . ESOPHAGOGASTRODUODENOSCOPY (EGD) WITH PROPOFOL N/A 11/03/2016   Procedure: ESOPHAGOGASTRODUODENOSCOPY (EGD) WITH  PROPOFOL;  Surgeon: Manya Silvas, MD;  Location: [facility] ENDOSCOPY;  Service: Endoscopy;  Laterality: N/A;  . RADIOACTIVE SEED GUIDED EXCISIONAL BREAST BIOPSY Right 09/08/2015   Procedure: RADIOACTIVE SEED GUIDED EXCISIONAL BREAST BIOPSY;  Surgeon: Rolm Bookbinder, MD;  Location: [facility];  Service: General;  Laterality: Right;    Home Medications:  Allergies as of 12/20/2016   No Known Allergies     Medication List       Accurate as of 12/20/16  1:36 PM. Always use your most recent med list.          aspirin EC 81 MG tablet Take 81 mg by mouth daily.   calcium carbonate 1250 (500 Ca) MG tablet Commonly known as:  OS-CAL - dosed in mg of elemental calcium Take 1 tablet by mouth daily with breakfast.   cetirizine 10 MG tablet Commonly known as:  ZYRTEC Take 10 mg by mouth daily as needed.   cholecalciferol 1000 units tablet Commonly known as:  VITAMIN D Take 1,000 Units by mouth daily.   diclofenac sodium 1 % Gel Commonly known as:  VOLTAREN Apply topically.   esomeprazole 20 MG capsule Commonly known as:  NEXIUM Take 20 mg by mouth daily as needed.   KRILL OIL PO Take 1 tablet by mouth daily.   potassium chloride 10 MEQ tablet Commonly known as:  K-DUR One tab daily for three days   PRESERVISION AREDS 2 PO Take 1 tablet by  mouth 2 (two) times daily.   temazepam 30 MG capsule Commonly known as:  RESTORIL Take 30 mg by mouth at bedtime.       Allergies: No Known Allergies  Family History: Family History  Problem Relation Age of Onset  . Osteoporosis Mother   . Heart failure Mother   . Heart block Brother   . Alcohol abuse Brother   . Bladder Cancer Neg Hx   . Kidney cancer Neg Hx     Social History:  reports that she has never smoked. She has never used smokeless tobacco. She reports that she does not drink alcohol or use drugs.  ROS: UROLOGY Frequent Urination?: No Hard to postpone urination?: No Burning/pain with urination?: Yes Get up at  night to urinate?: No Leakage of urine?: No Urine stream starts and stops?: No Trouble starting stream?: No Do you have to strain to urinate?: No Blood in urine?: No Urinary tract infection?: No Sexually transmitted disease?: No Injury to kidneys or bladder?: No Painful intercourse?: No Weak stream?: No Currently pregnant?: No Vaginal bleeding?: No Last menstrual period?: n  Gastrointestinal Nausea?: No Vomiting?: No Indigestion/heartburn?: No Diarrhea?: No Constipation?: No  Constitutional Fever: No Night sweats?: No Weight loss?: No Fatigue?: No  Skin Skin rash/lesions?: No Itching?: No  Eyes Blurred vision?: No Double vision?: No  Ears/Nose/Throat Sore throat?: No Sinus problems?: No  Hematologic/Lymphatic Swollen glands?: No Easy bruising?: No  Cardiovascular Leg swelling?: No Chest pain?: No  Respiratory Cough?: No Shortness of breath?: No  Endocrine Excessive thirst?: No  Musculoskeletal Back pain?: No Joint pain?: No  Neurological Headaches?: No Dizziness?: No  Psychologic Depression?: No Anxiety?: No  Physical Exam: BP 123/77 (BP Location: Left Arm, Patient Position: Sitting, Cuff Size: Normal)   Pulse 86   Ht 5\' 2"  (1.575 m)   Wt 65.2 kg (143 lb 11.2 oz)   BMI 26.28 kg/m   Constitutional:  Alert and oriented, No acute distress. HEENT: [place] AT, moist mucus membranes.  Trachea midline, no masses. Cardiovascular: No clubbing, cyanosis, or edema. Respiratory: Normal respiratory effort, no increased work of breathing. GI: Abdomen is soft, nontender, nondistended, no abdominal masses GU: Large grade 2 cystocele with moderate defect. At rest she had minimal prolapse. When she bared down the prolapsed ascending at some. She had good vaginal length but she could not cough hard. She had no rectocele and no incontinence Skin: No rashes, bruises or suspicious lesions. Lymph: No cervical or inguinal adenopathy. Neurologic: Grossly intact, no  focal deficits, moving all 4 extremities. Psychiatric: Normal mood and affect.  Laboratory Data: Lab Results  Component Value Date   WBC 7.0 11/20/2016   HGB 14.1 11/20/2016   HCT 39.9 11/20/2016   MCV 86.9 11/20/2016   PLT 479 (H) 11/20/2016    Lab Results  Component Value Date   CREATININE 0.78 11/21/2016    No results found for: PSA  No results found for: TESTOSTERONE  No results found for: HGBA1C  Urinalysis    Component Value Date/Time   COLORURINE AMBER (A) 11/20/2016 0728   APPEARANCEUR CLEAR (A) 11/20/2016 0728   LABSPEC 1.004 (L) 11/20/2016 0728   PHURINE 7.0 11/20/2016 0728   GLUCOSEU NEGATIVE 11/20/2016 0728   HGBUR NEGATIVE 11/20/2016 0728   BILIRUBINUR NEGATIVE 11/20/2016 0728   KETONESUR NEGATIVE 11/20/2016 0728   PROTEINUR NEGATIVE 11/20/2016 0728   NITRITE POSITIVE (A) 11/20/2016 0728   LEUKOCYTESUR NEGATIVE 11/20/2016 0728    Pertinent Imaging: None  Assessment & Plan:  The patient  has milder prolapse. Natural history was discussed. The role of vaginal dryness was discussed. I do not recommend surgery. The role of a pessary was discussed. Reassurance given  The patient has consider has considered a pessary with gynecology at [facility] payment is too high. She chose [facility]   1. Bladder prolapse, female, acquired  Cystocele   - Urinalysis, Complete   No Follow-up on file.  Reece Packer, MD  [facility] [address], [place], [place] [address] [phone]

## 2016-12-28 ENCOUNTER — Encounter: Payer: Medicare Other | Admitting: Obstetrics and Gynecology

## 2017-01-15 ENCOUNTER — Emergency Department: Payer: Medicare Other

## 2017-01-15 ENCOUNTER — Emergency Department: Payer: Medicare Other | Admitting: Anesthesiology

## 2017-01-15 ENCOUNTER — Encounter: Payer: Self-pay | Admitting: Emergency Medicine

## 2017-01-15 ENCOUNTER — Observation Stay
Admission: EM | Admit: 2017-01-15 | Discharge: 2017-01-16 | Disposition: A | Discharge status: Home / Self Care | Payer: Medicare Other | Attending: Surgery | Admitting: Surgery

## 2017-01-15 ENCOUNTER — Encounter
Admission: EM | Disposition: A | Discharge status: Home / Self Care | Payer: Self-pay | Source: Home / Self Care | Attending: Emergency Medicine

## 2017-01-15 DIAGNOSIS — Z7982 Long term (current) use of aspirin: Secondary | ICD-10-CM | POA: Diagnosis not present

## 2017-01-15 DIAGNOSIS — M810 Age-related osteoporosis without current pathological fracture: Secondary | ICD-10-CM | POA: Insufficient documentation

## 2017-01-15 DIAGNOSIS — K920 Hematemesis: Secondary | ICD-10-CM | POA: Diagnosis not present

## 2017-01-15 DIAGNOSIS — G4700 Insomnia, unspecified: Secondary | ICD-10-CM | POA: Diagnosis not present

## 2017-01-15 DIAGNOSIS — K449 Diaphragmatic hernia without obstruction or gangrene: Secondary | ICD-10-CM | POA: Diagnosis not present

## 2017-01-15 DIAGNOSIS — K769 Liver disease, unspecified: Secondary | ICD-10-CM | POA: Diagnosis not present

## 2017-01-15 DIAGNOSIS — H353190 Nonexudative age-related macular degeneration, unspecified eye, stage unspecified: Secondary | ICD-10-CM | POA: Diagnosis not present

## 2017-01-15 DIAGNOSIS — Z79899 Other long term (current) drug therapy: Secondary | ICD-10-CM | POA: Insufficient documentation

## 2017-01-15 DIAGNOSIS — M1990 Unspecified osteoarthritis, unspecified site: Secondary | ICD-10-CM | POA: Insufficient documentation

## 2017-01-15 DIAGNOSIS — K56609 Unspecified intestinal obstruction, unspecified as to partial versus complete obstruction: Secondary | ICD-10-CM

## 2017-01-15 DIAGNOSIS — K4030 Unilateral inguinal hernia, with obstruction, without gangrene, not specified as recurrent: Principal | ICD-10-CM | POA: Diagnosis present

## 2017-01-15 DIAGNOSIS — I10 Essential (primary) hypertension: Secondary | ICD-10-CM | POA: Diagnosis not present

## 2017-01-15 DIAGNOSIS — K5730 Diverticulosis of large intestine without perforation or abscess without bleeding: Secondary | ICD-10-CM | POA: Insufficient documentation

## 2017-01-15 DIAGNOSIS — Z90710 Acquired absence of both cervix and uterus: Secondary | ICD-10-CM | POA: Insufficient documentation

## 2017-01-15 DIAGNOSIS — Z86010 Personal history of colonic polyps: Secondary | ICD-10-CM | POA: Diagnosis not present

## 2017-01-15 HISTORY — PX: INGUINAL HERNIA REPAIR: SHX194

## 2017-01-15 LAB — CBC
HCT: 42.4 % (ref 35.0–47.0)
HEMOGLOBIN: 14.5 g/dL (ref 12.0–16.0)
MCH: 30.6 pg (ref 26.0–34.0)
MCHC: 34.1 g/dL (ref 32.0–36.0)
MCV: 89.6 fL (ref 80.0–100.0)
Platelets: 564 10*3/uL — ABNORMAL HIGH (ref 150–440)
RBC: 4.74 MIL/uL (ref 3.80–5.20)
RDW: 13.7 % (ref 11.5–14.5)
WBC: 15.6 10*3/uL — ABNORMAL HIGH (ref 3.6–11.0)

## 2017-01-15 LAB — URINALYSIS, ROUTINE W REFLEX MICROSCOPIC
BILIRUBIN URINE: NEGATIVE
Bacteria, UA: NONE SEEN
Glucose, UA: NEGATIVE mg/dL
HGB URINE DIPSTICK: NEGATIVE
Ketones, ur: NEGATIVE mg/dL
NITRITE: NEGATIVE
Protein, ur: NEGATIVE mg/dL
SPECIFIC GRAVITY, URINE: 1.014 (ref 1.005–1.030)
pH: 8 (ref 5.0–8.0)

## 2017-01-15 LAB — COMPREHENSIVE METABOLIC PANEL
ALBUMIN: 4.6 g/dL (ref 3.5–5.0)
ALK PHOS: 65 U/L (ref 38–126)
ALT: 8 U/L — AB (ref 14–54)
AST: 28 U/L (ref 15–41)
Anion gap: 11 (ref 5–15)
BUN: 11 mg/dL (ref 6–20)
CALCIUM: 10 mg/dL (ref 8.9–10.3)
CO2: 26 mmol/L (ref 22–32)
Chloride: 97 mmol/L — ABNORMAL LOW (ref 101–111)
Creatinine, Ser: 0.6 mg/dL (ref 0.44–1.00)
GFR calc Af Amer: >60 mL/min (ref >60)
GFR calc non Af Amer: >60 mL/min (ref >60)
GLUCOSE: 134 mg/dL — AB (ref 65–99)
Potassium: 3.7 mmol/L (ref 3.5–5.1)
SODIUM: 134 mmol/L — AB (ref 135–145)
Total Bilirubin: 0.9 mg/dL (ref 0.3–1.2)
Total Protein: 7.4 g/dL (ref 6.5–8.1)

## 2017-01-15 LAB — TYPE AND SCREEN
ABO/RH(D): A POS
Antibody Screen: NEGATIVE

## 2017-01-15 LAB — PROTIME-INR
INR: 1
Prothrombin Time: 13.1 seconds (ref 11.4–15.2)

## 2017-01-15 LAB — LACTIC ACID, PLASMA: Lactic Acid, Venous: 1.5 mmol/L (ref 0.5–1.9)

## 2017-01-15 LAB — APTT: aPTT: 31 seconds (ref 24–36)

## 2017-01-15 LAB — LIPASE, BLOOD: Lipase: 22 U/L (ref 11–51)

## 2017-01-15 LAB — AMMONIA: Ammonia: 15 umol/L (ref 9–35)

## 2017-01-15 SURGERY — REPAIR, HERNIA, INGUINAL, ADULT
Anesthesia: General

## 2017-01-15 MED ORDER — SODIUM CHLORIDE 0.9 % IV SOLN
INTRAVENOUS | Status: DC
  Administered 2017-01-15: 04:00:00 via INTRAVENOUS

## 2017-01-15 MED ORDER — LACTATED RINGERS IV SOLN
INTRAVENOUS | Status: DC | PRN
  Administered 2017-01-15: 08:00:00 via INTRAVENOUS

## 2017-01-15 MED ORDER — CALCIUM CARBONATE ANTACID 500 MG PO CHEW
1.0000 | CHEWABLE_TABLET | Freq: Every day | ORAL | Status: DC
  Administered 2017-01-15: 200 mg via ORAL
  Filled 2017-01-15: qty 1

## 2017-01-15 MED ORDER — ROCURONIUM BROMIDE 50 MG/5ML IV SOLN
INTRAVENOUS | Status: AC
  Filled 2017-01-15: qty 1

## 2017-01-15 MED ORDER — FENTANYL CITRATE (PF) 100 MCG/2ML IJ SOLN
INTRAMUSCULAR | Status: AC
  Filled 2017-01-15: qty 2

## 2017-01-15 MED ORDER — ASPIRIN EC 81 MG PO TBEC
81.0000 mg | DELAYED_RELEASE_TABLET | Freq: Every day | ORAL | Status: DC
  Administered 2017-01-15: 81 mg via ORAL
  Filled 2017-01-15: qty 1

## 2017-01-15 MED ORDER — ONDANSETRON HCL 4 MG/2ML IJ SOLN
INTRAMUSCULAR | Status: AC
  Filled 2017-01-15: qty 2

## 2017-01-15 MED ORDER — IOPAMIDOL (ISOVUE-300) INJECTION 61%
100.0000 mL | Freq: Once | INTRAVENOUS | Status: AC | PRN
  Administered 2017-01-15: 100 mL via INTRAVENOUS

## 2017-01-15 MED ORDER — ACETAMINOPHEN 10 MG/ML IV SOLN
INTRAVENOUS | Status: AC
  Filled 2017-01-15: qty 100

## 2017-01-15 MED ORDER — PROPOFOL 10 MG/ML IV BOLUS
INTRAVENOUS | Status: DC | PRN
  Administered 2017-01-15: 150 mg via INTRAVENOUS

## 2017-01-15 MED ORDER — SODIUM CHLORIDE 0.9 % IV BOLUS (SEPSIS)
1000.0000 mL | Freq: Once | INTRAVENOUS | Status: AC
  Administered 2017-01-15: 1000 mL via INTRAVENOUS

## 2017-01-15 MED ORDER — ROCURONIUM BROMIDE 100 MG/10ML IV SOLN
INTRAVENOUS | Status: DC | PRN
  Administered 2017-01-15: 30 mg via INTRAVENOUS

## 2017-01-15 MED ORDER — FENTANYL CITRATE (PF) 100 MCG/2ML IJ SOLN
25.0000 ug | INTRAMUSCULAR | Status: DC | PRN
  Administered 2017-01-15 (×3): 50 ug via INTRAVENOUS

## 2017-01-15 MED ORDER — MORPHINE SULFATE (PF) 4 MG/ML IV SOLN: 2.0000 mg | INTRAVENOUS | Status: DC | PRN

## 2017-01-15 MED ORDER — LORATADINE 10 MG PO TABS
10.0000 mg | ORAL_TABLET | Freq: Every day | ORAL | Status: DC
  Administered 2017-01-15: 10 mg via ORAL
  Filled 2017-01-15: qty 1

## 2017-01-15 MED ORDER — ACETAMINOPHEN 10 MG/ML IV SOLN
INTRAVENOUS | Status: DC | PRN
  Administered 2017-01-15: 1000 mg via INTRAVENOUS

## 2017-01-15 MED ORDER — HEPARIN SODIUM (PORCINE) 5000 UNIT/ML IJ SOLN
5000.0000 [IU] | Freq: Three times a day (TID) | INTRAMUSCULAR | Status: DC
  Administered 2017-01-15 – 2017-01-16 (×3): 5000 [IU] via SUBCUTANEOUS
  Filled 2017-01-15 (×3): qty 1

## 2017-01-15 MED ORDER — CEFOTETAN DISODIUM-DEXTROSE 2-2.08 GM-% IV SOLR
2.0000 g | Freq: Once | INTRAVENOUS | Status: AC
  Administered 2017-01-15: 2 g via INTRAVENOUS
  Filled 2017-01-15 (×4): qty 50

## 2017-01-15 MED ORDER — FENTANYL CITRATE (PF) 100 MCG/2ML IJ SOLN
INTRAMUSCULAR | Status: DC | PRN
  Administered 2017-01-15: 100 ug via INTRAVENOUS

## 2017-01-15 MED ORDER — SODIUM CHLORIDE 0.9 % IV SOLN
INTRAVENOUS | Status: DC
  Administered 2017-01-15 (×2): via INTRAVENOUS

## 2017-01-15 MED ORDER — ALUM & MAG HYDROXIDE-SIMETH 200-200-20 MG/5ML PO SUSP
30.0000 mL | Freq: Four times a day (QID) | ORAL | Status: DC | PRN
  Administered 2017-01-15: 30 mL via ORAL
  Filled 2017-01-15: qty 30

## 2017-01-15 MED ORDER — PROPOFOL 10 MG/ML IV BOLUS
INTRAVENOUS | Status: AC
  Filled 2017-01-15: qty 20

## 2017-01-15 MED ORDER — DEXTROSE 5 % IV SOLN: 2.0000 g | Freq: Once | INTRAVENOUS | Status: DC

## 2017-01-15 MED ORDER — ONDANSETRON HCL 4 MG/2ML IJ SOLN
4.0000 mg | Freq: Once | INTRAMUSCULAR | Status: AC | PRN
  Administered 2017-01-15: 4 mg via INTRAVENOUS

## 2017-01-15 MED ORDER — TEMAZEPAM 15 MG PO CAPS
30.0000 mg | ORAL_CAPSULE | Freq: Every day | ORAL | Status: DC
  Administered 2017-01-15: 30 mg via ORAL
  Filled 2017-01-15: qty 2

## 2017-01-15 MED ORDER — PANTOPRAZOLE SODIUM 40 MG PO TBEC
40.0000 mg | DELAYED_RELEASE_TABLET | Freq: Every day | ORAL | Status: DC
  Administered 2017-01-15: 40 mg via ORAL
  Filled 2017-01-15: qty 1

## 2017-01-15 MED ORDER — SUCCINYLCHOLINE CHLORIDE 20 MG/ML IJ SOLN
INTRAMUSCULAR | Status: DC | PRN
  Administered 2017-01-15: 100 mg via INTRAVENOUS

## 2017-01-15 MED ORDER — BUPIVACAINE-EPINEPHRINE 0.25% -1:200000 IJ SOLN
INTRAMUSCULAR | Status: DC | PRN
  Administered 2017-01-15: 30 mL

## 2017-01-15 MED ORDER — SUGAMMADEX SODIUM 200 MG/2ML IV SOLN
INTRAVENOUS | Status: DC | PRN
  Administered 2017-01-15: 130 mg via INTRAVENOUS

## 2017-01-15 MED ORDER — ONDANSETRON HCL 4 MG/2ML IJ SOLN
4.0000 mg | INTRAMUSCULAR | Status: DC | PRN
  Administered 2017-01-15: 4 mg via INTRAVENOUS
  Filled 2017-01-15: qty 2

## 2017-01-15 MED ORDER — SODIUM CHLORIDE 0.9 % IV SOLN
80.0000 mg | Freq: Once | INTRAVENOUS | Status: AC
  Administered 2017-01-15: 04:00:00 80 mg via INTRAVENOUS
  Filled 2017-01-15: qty 80

## 2017-01-15 MED ORDER — ONDANSETRON HCL 4 MG/2ML IJ SOLN
4.0000 mg | Freq: Once | INTRAMUSCULAR | Status: AC
  Administered 2017-01-15: 4 mg via INTRAVENOUS
  Filled 2017-01-15: qty 2

## 2017-01-15 MED ORDER — VITAMIN D 1000 UNITS PO TABS
1000.0000 [IU] | ORAL_TABLET | Freq: Every day | ORAL | Status: DC
  Administered 2017-01-15: 1000 [IU] via ORAL
  Filled 2017-01-15: qty 1

## 2017-01-15 MED ORDER — BUPIVACAINE-EPINEPHRINE (PF) 0.25% -1:200000 IJ SOLN
INTRAMUSCULAR | Status: AC
  Filled 2017-01-15: qty 30

## 2017-01-15 MED ORDER — SUCCINYLCHOLINE CHLORIDE 20 MG/ML IJ SOLN
INTRAMUSCULAR | Status: AC
  Filled 2017-01-15: qty 1

## 2017-01-15 MED ORDER — ONDANSETRON HCL 4 MG/2ML IJ SOLN
INTRAMUSCULAR | Status: DC | PRN
  Administered 2017-01-15: 4 mg via INTRAVENOUS

## 2017-01-15 MED ORDER — HYDROCODONE-ACETAMINOPHEN 5-325 MG PO TABS
1.0000 | ORAL_TABLET | ORAL | Status: DC | PRN
  Administered 2017-01-15 – 2017-01-16 (×3): 1 via ORAL
  Filled 2017-01-15: qty 2
  Filled 2017-01-15 (×2): qty 1

## 2017-01-15 MED ORDER — DEXAMETHASONE SODIUM PHOSPHATE 10 MG/ML IJ SOLN
INTRAMUSCULAR | Status: AC
  Filled 2017-01-15: qty 1

## 2017-01-15 SURGICAL SUPPLY — 44 items
ADHESIVE MASTISOL STRL (MISCELLANEOUS) IMPLANT
BLADE SURG 15 STRL LF DISP TIS (BLADE) ×2 IMPLANT
BLADE SURG 15 STRL SS (BLADE) ×1
CANISTER SUCT 1200ML W/VALVE (MISCELLANEOUS) ×3 IMPLANT
CHLORAPREP W/TINT 26ML (MISCELLANEOUS) ×3 IMPLANT
DRAIN PENROSE 1/4X12 LTX (DRAIN) ×3 IMPLANT
DRAPE LAPAROTOMY 100X77 ABD (DRAPES) ×3 IMPLANT
DRSG OPSITE POSTOP 4X6 (GAUZE/BANDAGES/DRESSINGS) ×3 IMPLANT
DRSG TELFA 3X8 NADH (GAUZE/BANDAGES/DRESSINGS) IMPLANT
ELECT REM PT RETURN 9FT ADLT (ELECTROSURGICAL) ×3
ELECTRODE REM PT RTRN 9FT ADLT (ELECTROSURGICAL) ×2 IMPLANT
GAUZE SPONGE 4X4 12PLY STRL (GAUZE/BANDAGES/DRESSINGS) ×3 IMPLANT
GLOVE BIO SURGEON STRL SZ8 (GLOVE) ×15 IMPLANT
GOWN STRL REUS W/ TWL LRG LVL3 (GOWN DISPOSABLE) ×4 IMPLANT
GOWN STRL REUS W/TWL LRG LVL3 (GOWN DISPOSABLE) ×2
KIT RM TURNOVER STRD PROC AR (KITS) ×3 IMPLANT
LABEL OR SOLS (LABEL) ×3 IMPLANT
NDL SAFETY 22GX1.5 (NEEDLE) ×3 IMPLANT
NS IRRIG 1000ML POUR BTL (IV SOLUTION) ×3 IMPLANT
NS IRRIG 500ML POUR BTL (IV SOLUTION) ×3 IMPLANT
PACK BASIN MAJOR ARMC (MISCELLANEOUS) ×3 IMPLANT
PACK BASIN MINOR ARMC (MISCELLANEOUS) ×3 IMPLANT
PACK COLON CLEAN CLOSURE (MISCELLANEOUS) IMPLANT
SEPRAFILM MEMBRANE 5X6 (MISCELLANEOUS) IMPLANT
SPONGE LAP 18X18 5 PK (GAUZE/BANDAGES/DRESSINGS) ×3 IMPLANT
STAPLER SKIN PROX 35W (STAPLE) ×3 IMPLANT
STRIP CLOSURE SKIN 1/2X4 (GAUZE/BANDAGES/DRESSINGS) IMPLANT
SUT ETHIBOND 0 (SUTURE) ×6 IMPLANT
SUT ETHIBOND CT1 BRD #0 30IN (SUTURE) ×15 IMPLANT
SUT ETHIBOND NAB CT1 #1 30IN (SUTURE) ×3 IMPLANT
SUT MNCRL 4-0 (SUTURE) ×1
SUT MNCRL 4-0 27XMFL (SUTURE) ×2
SUT PDS AB 1 TP1 54 (SUTURE) ×6 IMPLANT
SUT PROLENE 0 CT 1 30 (SUTURE) IMPLANT
SUT PROLENE 1 CT 1 30 (SUTURE) IMPLANT
SUT SILK 3-0 (SUTURE) ×3 IMPLANT
SUT VIC AB 0 CT1 36 (SUTURE) ×6 IMPLANT
SUT VIC AB 3-0 SH 27 (SUTURE) ×1
SUT VIC AB 3-0 SH 27X BRD (SUTURE) ×2 IMPLANT
SUT VICRYL 2 0 18  UND BR (SUTURE) ×1
SUT VICRYL 2 0 18 UND BR (SUTURE) ×2 IMPLANT
SUTURE MNCRL 4-0 27XMF (SUTURE) ×2 IMPLANT
SYRINGE 10CC LL (SYRINGE) ×3 IMPLANT
TRAY FOLEY W/METER SILVER 16FR (SET/KITS/TRAYS/PACK) ×3 IMPLANT

## 2017-01-15 NOTE — Anesthesia Procedure Notes (Signed)
Procedure Name: Intubation Date/Time: 01/15/2017 7:52 AM Performed by: Jonna Clark Pre-anesthesia Checklist: Patient identified, Patient being monitored, Timeout performed, Emergency Drugs available and Suction available Patient Re-evaluated:Patient Re-evaluated prior to induction Oxygen Delivery Method: Circle system utilized Preoxygenation: Pre-oxygenation with 100% oxygen Induction Type: IV induction, Rapid sequence and Cricoid Pressure applied Ventilation: Mask ventilation without difficulty Laryngoscope Size: Mac and 3 Grade View: Grade I Tube type: Oral Tube size: 7.0 mm Number of attempts: 1 Placement Confirmation: ETT inserted through vocal cords under direct vision,  positive ETCO2 and breath sounds checked- equal and bilateral Secured at: 21 cm Tube secured with: Tape Dental Injury: Teeth and Oropharynx as per pre-operative assessment

## 2017-01-15 NOTE — ED Triage Notes (Signed)
Pt c/o N/V, dark brown emesis, and tarry stool since 1900 last night. Pt has HX of colon polyps. Pt denies blood thinners.

## 2017-01-15 NOTE — ED Provider Notes (Addendum)
[facility] Emergency Department Provider Note  ____________________________________________   First MD Initiated Contact with Patient 01/15/17 [phone]     (approximate)  I have reviewed the triage vital signs and the nursing notes.   HISTORY  Chief Complaint Bloated; Rectal Bleeding; and Emesis    HPI Barbara Vega is a 83 y.o. female who presents by private vehicle for evaluation of lower central abdominal discomfort, vomiting dark brown material that looks like blood, and having one dark black bowel movement.  She states that she he has not felt well for days with some generalized weakness and malaise but no specific symptoms.  About 7 hours ago she developed nauseaand the lower central abdominal pain and when she vomited several times it was dark and looked like blood.  She then had a loose bowel movement that also looked black and tarry. She is in moderate discomfort at this time although she has a hard time describing what is causing the discomfort other than nausea.  Her symptoms are severe.  Nothing in particular makes the patient's symptoms better nor worse.    She does not take blood thinners.  She denies fever/chills, chest pain, shortness of breath.  Past Medical History:  Diagnosis Date  . Adenomatous colon polyp   . Anemia    hx  . Arthritis   . Benign breast lumps   . History of colon polyps    benign  . Hypertension    takes Triamterene-HCTZ daily  . Insomnia    takes Restoril nightly  . Macular degeneration    dry  . Osteoporosis   . Sinus infection    taking Ceftin daily     Patient Active Problem List   Diagnosis Date Noted  . Incarcerated left inguinal hernia   . Small bowel obstruction ([place])   . Hyponatremia 11/20/2016  . Papilloma of breast 04/03/2015  . Osteoporosis, post-menopausal 03/04/2015  . Cannot sleep 03/04/2015  . BP (high blood pressure) 03/04/2015  . Adenomatous colon polyp 03/04/2015    Past Surgical  History:  Procedure Laterality Date  . ABDOMINAL HYSTERECTOMY    . BREAST LUMPECTOMY Right 09/08/2015   intraductal papilloma  . BREAST LUMPECTOMY Left    2 bx done only one scar seen years ago neg  . cataract surgery Bilateral   . COLONOSCOPY    . COLONOSCOPY WITH PROPOFOL N/A 11/03/2016   Procedure: COLONOSCOPY WITH PROPOFOL;  Surgeon: Manya Silvas, MD;  Location: [facility] ENDOSCOPY;  Service: Endoscopy;  Laterality: N/A;  . ESOPHAGOGASTRODUODENOSCOPY (EGD) WITH PROPOFOL N/A 11/03/2016   Procedure: ESOPHAGOGASTRODUODENOSCOPY (EGD) WITH PROPOFOL;  Surgeon: Manya Silvas, MD;  Location: [facility] ENDOSCOPY;  Service: Endoscopy;  Laterality: N/A;  . RADIOACTIVE SEED GUIDED EXCISIONAL BREAST BIOPSY Right 09/08/2015   Procedure: RADIOACTIVE SEED GUIDED EXCISIONAL BREAST BIOPSY;  Surgeon: Rolm Bookbinder, MD;  Location: [place];  Service: General;  Laterality: Right;    Prior to Admission medications   Medication Sig Start Date End Date Taking? Authorizing Provider  aspirin EC 81 MG tablet Take 81 mg by mouth daily.    [provider]  calcium carbonate (OS-CAL - DOSED IN MG OF ELEMENTAL CALCIUM) 1250 (500 CA) MG tablet Take 1 tablet by mouth daily with breakfast.     [provider]  cetirizine (ZYRTEC) 10 MG tablet Take 10 mg by mouth daily as needed.     [provider]  cholecalciferol (VITAMIN D) 1000 UNITS tablet Take 1,000 Units by mouth daily.  [provider]  diclofenac sodium (VOLTAREN) 1 % GEL Apply topically. 09/01/16 09/01/17  [provider]  esomeprazole (NEXIUM) 20 MG capsule Take 20 mg by mouth daily as needed.     [provider]  KRILL OIL PO Take 1 tablet by mouth daily.     [provider]  Multiple Vitamins-Minerals (PRESERVISION AREDS 2 PO) Take 1 tablet by mouth 2 (two) times daily.    [provider]  potassium chloride (K-DUR) 10 MEQ tablet One tab daily for three days 11/21/16   Loletha Grayer, MD    temazepam (RESTORIL) 30 MG capsule Take 30 mg by mouth at bedtime.  09/13/14   [provider]    Allergies Patient has no known allergies.  Family History  Problem Relation Age of Onset  . Osteoporosis Mother   . Heart failure Mother   . Heart block Brother   . Alcohol abuse Brother   . Bladder Cancer Neg Hx   . Kidney cancer Neg Hx     Social History Social History  Substance Use Topics  . Smoking status: Never Smoker  . Smokeless tobacco: Never Used  . Alcohol use No    Review of Systems Constitutional: No fever/chills Eyes: No visual changes. ENT: No sore throat. Cardiovascular: Denies chest pain. Respiratory: Denies shortness of breath. Gastrointestinal: lower central abdominal pain, vomiting black material,loose black tarry stool Genitourinary: Negative for dysuria. Musculoskeletal: Negative for neck pain.  Negative for back pain. Integumentary: Negative for rash. Neurological: Negative for headaches, focal weakness or numbness.   ____________________________________________   PHYSICAL EXAM:  VITAL SIGNS: ED Triage Vitals [01/15/17 0305]  Enc Vitals Group     BP (!) 146/75     Pulse Rate 95     Resp 16     Temp 98.6 F (37 C)     Temp Source Oral     SpO2 96 %     Weight 64.9 kg (143 lb)     Height      Head Circumference      Peak Flow      Pain Score      Pain Loc      Pain Edu?      Excl. in [place]?     Constitutional: Alert and oriented. appears uncomfortable Eyes: Conjunctivae are normal.  Head: Atraumatic. Nose: No congestion/rhinnorhea. Mouth/Throat: Mucous membranes are moist. Neck: No stridor.  No meningeal signs.   Cardiovascular: borderline tachycardia, regular rhythm. Good peripheral circulation. Grossly normal heart sounds. Respiratory: Normal respiratory effort.  No retractions. Lungs CTAB. Gastrointestinal: Soft with tenderness to palpation of the suprapubic region. the patient reports abdominal distention but I do not  appreciate it on exam. normal external rectal exam.  There is no stool present in the rectal vault and as a result of the Hemoccult card is technically negative but I do not feel there is sufficient sample for it to be an adequate test.   ED chaperone present throughout exam.   Musculoskeletal: No lower extremity tenderness nor edema. No gross deformities of extremities. Neurologic:  Normal speech and language. No gross focal neurologic deficits are appreciated.  Skin:  Skin is warm, dry and intact. No rash noted. Psychiatric: Mood and affect are normal. Speech and behavior are normal.  ____________________________________________   LABS (all labs ordered are listed, but only abnormal results are displayed)  Labs Reviewed  COMPREHENSIVE METABOLIC PANEL - Abnormal; Notable for the following:       Result Value  Sodium 134 (*)    Chloride 97 (*)    Glucose, Bld 134 (*)    ALT 8 (*)    All other components within normal limits  CBC - Abnormal; Notable for the following:    WBC 15.6 (*)    Platelets 564 (*)    All other components within normal limits  URINALYSIS, ROUTINE W REFLEX MICROSCOPIC - Abnormal; Notable for the following:    Color, Urine STRAW (*)    APPearance CLEAR (*)    Leukocytes, UA TRACE (*)    Squamous Epithelial / LPF 0-5 (*)    All other components within normal limits  PROTIME-INR  LACTIC ACID, PLASMA  LIPASE, BLOOD  APTT  AMMONIA  CBC  CREATININE, SERUM  POC OCCULT BLOOD, ED  TYPE AND SCREEN   ____________________________________________  EKG  ED ECG REPORT I, Thania Woodlief, the attending physician, personally viewed and interpreted this ECG.  Date: 01/15/2017 EKG Time: 4:04 AM Rate: 76 Rhythm: normal sinus rhythm QRS Axis: RAD Intervals: normal ST/T Wave abnormalities: Non-specific ST segment / T-wave changes, but no evidence of acute ischemia. Narrative Interpretation: no evidence of acute  ischemia  ____________________________________________  RADIOLOGY   Ct Abdomen Pelvis W Contrast  Result Date: 01/15/2017 CLINICAL DATA:  Dark brown emesis and teres stools since 19:00. EXAM: CT ABDOMEN AND PELVIS WITH CONTRAST TECHNIQUE: Multidetector CT imaging of the abdomen and pelvis was performed using the standard protocol following bolus administration of intravenous contrast. CONTRAST:  150mL ISOVUE-300 IOPAMIDOL (ISOVUE-300) INJECTION 61% COMPARISON:  None. FINDINGS: Lower chest: No acute abnormality. Hepatobiliary: Tiny focal liver lesions, well under a cm, not characterized but more likely benign. Gallbladder and bile ducts are normal. Pancreas: Unremarkable. No pancreatic ductal dilatation or surrounding inflammatory changes. Spleen: Normal in size without focal abnormality. Adrenals/Urinary Tract: 2.7 cm left adrenal nodule. Right adrenal is normal. Both kidneys are normal.  Ureters and urinary bladder are normal. Stomach/Bowel: Moderately large hiatal hernia. Marked dilatation of small bowel with mild mural thickening and mural enhancement. Abrupt caliber transition to decompressed small bowel, within a left inguinal hernia. Colon is notable only for sigmoid diverticulosis. Appendix is normal. Vascular/Lymphatic: The abdominal aorta is normal in caliber with no atherosclerotic calcification. No adenopathy in the abdomen or pelvis. Reproductive: Hysterectomy.  No adnexal abnormality. Other: No ascites. Musculoskeletal: No significant skeletal lesion. IMPRESSION: 1. High-grade small bowel obstruction within a left inguinal hernia. 2. Indeterminate 2.7 cm left adrenal nodule. Recommend adrenal protocol CT or MRI to characterize. 3. Colonic diverticulosis. 4. Hiatal hernia. 5. Subcentimeter focal low-attenuation liver lesions, not characterized but more likely benign. Electronically Signed   By: Andreas Newport M.D.   On: 01/15/2017 05:21     ____________________________________________   PROCEDURES  Critical Care performed: No   Procedure(s) performed:   Procedures   ____________________________________________   INITIAL IMPRESSION / ASSESSMENT AND PLAN / ED COURSE  Pertinent labs & imaging results that were available during my care of the patient were reviewed by me and considered in my medical decision making (see chart for details).  I am concerned about the report of hematemesis as well as a dark stool, nausea, and lower abdominal pain.  I am doing the usual GI bleeding work up and as soon as we verify that her GFR is sufficient I will obtain an IV contrast only CT scan to make sure she does not have any evidence of diverticulitis or other acute abdominal pathology.  Regardless she will require admission with GI workup and  further investigation of her hematemesis and dark and tarry stools which presumably is melena although there is not a sample to test currently.   Clinical Course as of Jan 16 947  Sat Jan 15, 2017  0436 GFR, Est Non African American: >60 [CF]  0550 high-grade bowel obstruction and left inguinal hernia.  I called and spoke by phone with Dr. Adin Hector will come to the ED and see the patient.  As per his recommendations I have ordered an NG tube and she is already getting IV fluids.  I updated the patient is that she does not feel any better except slightly less nauseated.  [CF]    Clinical Course User Index [CF] Hinda Kehr, MD    ____________________________________________  FINAL CLINICAL IMPRESSION(S) / ED DIAGNOSES  Final diagnoses:  Small bowel obstruction ([place])  Incarcerated left inguinal hernia  Hematemesis with nausea     MEDICATIONS GIVEN DURING THIS VISIT:  Medications  fentaNYL (SUBLIMAZE) injection 25-50 mcg (50 mcg Intravenous Given 01/15/17 0947)  0.9 %  sodium chloride infusion (not administered)  temazepam (RESTORIL) capsule 30 mg (not administered)  calcium  carbonate (OS-CAL - dosed in mg of elemental calcium) tablet 500 mg of elemental calcium (not administered)  cholecalciferol (VITAMIN D) tablet 1,000 Units (not administered)  aspirin EC tablet 81 mg (not administered)  loratadine (CLARITIN) tablet 10 mg (not administered)  pantoprazole (PROTONIX) EC tablet 40 mg (not administered)  heparin injection 5,000 Units (not administered)  HYDROcodone-acetaminophen (NORCO/VICODIN) 5-325 MG per tablet 1-2 tablet (not administered)  morphine 4 MG/ML injection 2 mg (not administered)  fentaNYL (SUBLIMAZE) 100 MCG/2ML injection (not administered)  ondansetron (ZOFRAN) 4 MG/2ML injection (not administered)  fentaNYL (SUBLIMAZE) 100 MCG/2ML injection (not administered)  ondansetron (ZOFRAN) injection 4 mg (4 mg Intravenous Given 01/15/17 0318)  pantoprazole (PROTONIX) 80 mg in sodium chloride 0.9 % 100 mL IVPB (0 mg Intravenous Stopped 01/15/17 0423)  iopamidol (ISOVUE-300) 61 % injection 100 mL (100 mLs Intravenous Contrast Given 01/15/17 0441)  sodium chloride 0.9 % bolus 1,000 mL (1,000 mLs Intravenous New Bag/Given 01/15/17 0625)  cefoTEtan in Dextrose 5% (CEFOTAN) IVPB 2 g (2 g Intravenous Given 01/15/17 0757)  ondansetron (ZOFRAN) injection 4 mg (4 mg Intravenous Given 01/15/17 0934)     NEW OUTPATIENT MEDICATIONS STARTED DURING THIS VISIT:  Current Discharge Medication List      Current Discharge Medication List      Current Discharge Medication List       Note:  This document was prepared using Dragon voice recognition software and may include unintentional dictation errors.    Hinda Kehr, MD 01/15/17 [place], [place], MD 01/15/17 [phone]

## 2017-01-15 NOTE — ED Notes (Signed)
Patient transported to CT 

## 2017-01-15 NOTE — Anesthesia Post-op Follow-up Note (Signed)
Anesthesia QCDR form completed.        

## 2017-01-15 NOTE — ED Notes (Signed)
Surgeon Dr Dahlia Byes at bedside.

## 2017-01-15 NOTE — ED Notes (Signed)
Pharm called

## 2017-01-15 NOTE — Transfer of Care (Signed)
Immediate Anesthesia Transfer of Care Note  Patient: Barbara Vega  Procedure(s) Performed: Procedure(s): HERNIA REPAIR INGUINAL ADULT (Left)  Patient Location: PACU  Anesthesia Type:General  Level of Consciousness: sedated and responds to stimulation  Airway & Oxygen Therapy: Patient Spontanous Breathing and Patient connected to face mask oxygen  Post-op Assessment: Report given to RN and Post -op Vital signs reviewed and stable  Post vital signs: Reviewed and stable  Last Vitals:  Vitals:   01/15/17 0630 01/15/17 0854  BP: 133/69 (!) 138/59  Pulse: 91 68  Resp: 17 19  Temp:  (!) 36.3 C  SpO2: 96% 100%    Last Pain:  Vitals:   01/15/17 0305  TempSrc: Oral         Complications: No apparent anesthesia complications

## 2017-01-15 NOTE — Op Note (Signed)
01/15/2017  8:52 AM  PATIENT:  Barbara Vega  83 y.o. female  PRE-OPERATIVE DIAGNOSIS:  Incarcerated left inguinal hernia with small bowel obstruction  POST-OPERATIVE DIAGNOSIS:  Incarcerated left inguinal hernia with bowel obstruction  PROCEDURE: McVey repair of left incarcerated inguinal hernia  SURGEON:  Florene Glen MD, FACS  ANESTHESIA:   Gen. with endotracheal tube and ilioinguinal nerve block   Details of Procedure: This patient with incarcerated left inguinal hernia with small bowel obstruction area she requires repair. Preoperatively discussed rationale for surgery the options of observation risk bleeding infection recurrence the fact that mesh will not likely be able to be used due to the presence of bowel within the hernia. We also discussed the risk of exploratory laparotomy and bowel resection should the bowel be compromised. Questions were answered for them in the high risk of recurrence were discussed especially with no mesh being utilized.  Findings incarcerated left inguinal hernia probably indirect in nature with a patulous femoral ring and canal but no obvious femoral hernia. Bowel was not compromised.  Description of procedure. Patient was induced to general anesthesia prepped draped sterile fashion a timeout was held and then a and ilioinguinal nerve block was performed on the left side with Marcaine. In incision was made and dissection down to external oblique fascia was performed utilizing electrocautery. Additional Marcaine was placed. The external block was opened and the hernia was reduced. The hernia sac was opened and there was no sign of ischemic bowel. The loop of colon as well as a loop of small bowel was brought up and inspected. There is no sign of ischemia. Palpation of the femoral ring through this direct or indirect hernia was performed. It was patulous but there was no femoral hernia.  Repair was performed by utilizing a McVay repair by performing a  relaxing incision in the standard fashion. Then 0 Ethibonds were utilized to approximate transversalis fascia to Barbara Vega's ligament and then a transition suture to iliopubic tract to completely repair the floor of the inguinal canal. Additional Marcaine was placed for a total of 30 cc. The external oblique was reapproximated with 3-0 Vicryl keeping the nerve in view at all times. Deep sutures of 3-0 Vicryl were placed followed by skin staples. A dressing was placed.  Sponge lap needle count was correct as me blood loss was minimal and the patient was taken recovery room in stable condition to be admitted for continued care.   Florene Glen, MD FACS

## 2017-01-15 NOTE — ED Notes (Signed)
This RN at bedside with MD Karma Greaser for POC occult blood.

## 2017-01-15 NOTE — Anesthesia Preprocedure Evaluation (Addendum)
Anesthesia Evaluation  Patient identified by MRN, date of birth, ID band Patient awake    Reviewed: Allergy & Precautions, H&P , NPO status , Patient's Chart, lab work & pertinent test results, reviewed documented beta blocker date and time   History of Anesthesia Complications Negative for: history of anesthetic complications  Airway Mallampati: II  TM Distance: >3 FB Neck ROM: full    Dental  (+) Partial Lower, Upper Dentures, Dental Advidsory Given   Pulmonary neg pulmonary ROS,           Cardiovascular Exercise Tolerance: Good hypertension, (-) angina(-) CAD, (-) Past MI, (-) Cardiac Stents and (-) CABG (-) dysrhythmias (-) Valvular Problems/Murmurs     Neuro/Psych negative neurological ROS  negative psych ROS   GI/Hepatic Neg liver ROS, GERD  ,  Endo/Other  negative endocrine ROS  Renal/GU negative Renal ROS  negative genitourinary   Musculoskeletal  (+) Arthritis , Osteoarthritis,    Abdominal   Peds  Hematology  (+) Blood dyscrasia, anemia ,   Anesthesia Other Findings Past Medical History: No date: Adenomatous colon polyp No date: Anemia     Comment:  hx No date: Arthritis No date: Benign breast lumps No date: History of colon polyps     Comment:  benign No date: Hypertension     Comment:  takes Triamterene-HCTZ daily No date: Insomnia     Comment:  takes Restoril nightly No date: Macular degeneration     Comment:  dry No date: Osteoporosis No date: Sinus infection     Comment:  taking Ceftin daily    Reproductive/Obstetrics negative OB ROS                            Anesthesia Physical Anesthesia Plan  ASA: II and emergent  Anesthesia Plan: General   Post-op Pain Management:    Induction: Intravenous, Rapid sequence and Cricoid pressure planned  PONV Risk Score and Plan: 3 and Ondansetron and Dexamethasone  Airway Management Planned: Oral ETT  Additional  Equipment:   Intra-op Plan:   Post-operative Plan: Extubation in OR  Informed Consent: I have reviewed the patients History and Physical, chart, labs and discussed the procedure including the risks, benefits and alternatives for the proposed anesthesia with the patient or authorized representative who has indicated his/her understanding and acceptance.   Dental Advisory Given  Plan Discussed with: Anesthesiologist, CRNA and Surgeon  Anesthesia Plan Comments:        Anesthesia Quick Evaluation

## 2017-01-15 NOTE — Care Management Obs Status (Signed)
[place] NOTIFICATION   Patient Details  Name: Barbara Vega MRN: [id number] Date of Birth: 07/05/1933   Medicare Observation Status Notification Given:  Yes    Omarrion Carmer A, RN 01/15/2017, 2:39 PM

## 2017-01-15 NOTE — Progress Notes (Signed)
Preoperative Review   Patient is met in the preoperative holding area. The history is reviewed in the chart and with the patient. I personally reviewed the options and rationale as well as the risks of this procedure that have been previously discussed with the patient. All questions asked by the patient and/or family were answered to their satisfaction. History discussed with patient patient's daughter and with Dr. Dahlia Byes. Agree with the need for acute surgical intervention for an incarcerated left inguinal hernia with small bowel obstruction. I described the risks and especially the risk of mesh placement mesh infection and the fact that I would not likely use mesh therefore the risk of recurrence is higher. Also the potential for an exploratory laparotomy for bowel resection should the bowel be compromised. Questions were answered for them they understood and agreed to proceed Patient agrees to proceed with this procedure at this time.  Florene Glen M.D. FACS

## 2017-01-15 NOTE — H&P (Signed)
Patient ID: Barbara Vega, female   DOB: 20-Sep-1933, 83 y.o.   MRN: [id number]  HPI Barbara Vega is a 83 y.o. female presented to the ER complaining of nausea vomiting and abdominal pain. Patient reports that her symptoms are less than 24 hours ago. She had multiple episode of dark emesis and abdominal pain. The pain now is located in the left lower quadrant and inguinal area. Pain worsens with movements. Pain is moderate and sharp and constant. She did have a bowel movement last night. CT scan personally reviewed and there is evidence of an incarcerated left inguinal hernia. Using a mechanical bowel obstruction. There is some thickening of the bowel within the inguinal region and there is decompressed GI. There is no evidence of pneumatosis and there is no evidence of free air. Her white count is 15,000 with a normal lactate and normal creatinine. She is otherwise a relatively healthy 83 year old and she is able to perform more than 4 Mets without any shortness of breath or chest pain. No family is available.   HPI  Past Medical History:  Diagnosis Date  . Adenomatous colon polyp   . Anemia    hx  . Arthritis   . Benign breast lumps   . History of colon polyps    benign  . Hypertension    takes Triamterene-HCTZ daily  . Insomnia    takes Restoril nightly  . Macular degeneration    dry  . Osteoporosis   . Sinus infection    taking Ceftin daily     Past Surgical History:  Procedure Laterality Date  . ABDOMINAL HYSTERECTOMY    . BREAST LUMPECTOMY Right 09/08/2015   intraductal papilloma  . BREAST LUMPECTOMY Left    2 bx done only one scar seen years ago neg  . cataract surgery Bilateral   . COLONOSCOPY    . COLONOSCOPY WITH PROPOFOL N/A 11/03/2016   Procedure: COLONOSCOPY WITH PROPOFOL;  Surgeon: Manya Silvas, MD;  Location: [facility] ENDOSCOPY;  Service: Endoscopy;  Laterality: N/A;  . ESOPHAGOGASTRODUODENOSCOPY (EGD) WITH PROPOFOL N/A 11/03/2016   Procedure:  ESOPHAGOGASTRODUODENOSCOPY (EGD) WITH PROPOFOL;  Surgeon: Manya Silvas, MD;  Location: [facility] ENDOSCOPY;  Service: Endoscopy;  Laterality: N/A;  . RADIOACTIVE SEED GUIDED EXCISIONAL BREAST BIOPSY Right 09/08/2015   Procedure: RADIOACTIVE SEED GUIDED EXCISIONAL BREAST BIOPSY;  Surgeon: Rolm Bookbinder, MD;  Location: [place];  Service: General;  Laterality: Right;    Family History  Problem Relation Age of Onset  . Osteoporosis Mother   . Heart failure Mother   . Heart block Brother   . Alcohol abuse Brother   . Bladder Cancer Neg Hx   . Kidney cancer Neg Hx     Social History Social History  Substance Use Topics  . Smoking status: Never Smoker  . Smokeless tobacco: Never Used  . Alcohol use No    No Known Allergies  Current Facility-Administered Medications  Medication Dose Route Frequency Provider Last Rate Last Dose  . 0.9 %  sodium chloride infusion   Intravenous Continuous Hinda Kehr, MD   Stopped at 01/15/17 [phone]  . sodium chloride 0.9 % bolus 1,000 mL  1,000 mL Intravenous Once Caroleen Hamman F, MD 1,000 mL/hr at 01/15/17 0625 1,000 mL at 01/15/17 8144   Current Outpatient Prescriptions  Medication Sig Dispense Refill  . aspirin EC 81 MG tablet Take 81 mg by mouth daily.    . calcium carbonate (OS-CAL - DOSED IN MG OF ELEMENTAL CALCIUM) 1250 (500 CA) MG  tablet Take 1 tablet by mouth daily with breakfast.     . cetirizine (ZYRTEC) 10 MG tablet Take 10 mg by mouth daily as needed.     . cholecalciferol (VITAMIN D) 1000 UNITS tablet Take 1,000 Units by mouth daily.    . diclofenac sodium (VOLTAREN) 1 % GEL Apply topically.    Marland Kitchen esomeprazole (NEXIUM) 20 MG capsule Take 20 mg by mouth daily as needed.     Marland Kitchen KRILL OIL PO Take 1 tablet by mouth daily.     . Multiple Vitamins-Minerals (PRESERVISION AREDS 2 PO) Take 1 tablet by mouth 2 (two) times daily.    . potassium chloride (K-DUR) 10 MEQ tablet One tab daily for three days 3 tablet 0  . temazepam (RESTORIL) 30 MG capsule  Take 30 mg by mouth at bedtime.        Review of Systems Full ROS  was asked and was negative except for the information on the HPI  Physical Exam Blood pressure 133/69, pulse 91, temperature 98.6 F (37 C), temperature source Oral, resp. rate 17, weight 64.9 kg (143 lb), SpO2 96 %. CONSTITUTIONAL: Elderly female in no acute distress EYES: Pupils are equal, round, and reactive to light, Sclera are non-icteric. EARS, NOSE, MOUTH AND THROAT: The oropharynx is clear. The oral mucosa is pink and moist. Hearing is intact to voice. LYMPH NODES:  Lymph nodes in the neck are normal. RESPIRATORY:  Lungs are clear. There is normal respiratory effort, with equal breath sounds bilaterally, and without pathologic use of accessory muscles. CARDIOVASCULAR: Heart is regular without murmurs, gallops, or rubs. GI: The abdomen is  Soft,mildly distended. There is an incarcerated left inguinal hernia with exquisite pain to palpation. I'm unable to reduce it. No overt peritonitis GU: Rectal deferred.   MUSCULOSKELETAL: Normal muscle strength and tone. No cyanosis or edema.   SKIN: Turgor is good and there are no pathologic skin lesions or ulcers. NEUROLOGIC: Motor and sensation is grossly normal. Cranial nerves are grossly intact. PSYCH:  Oriented to person, place and time. Affect is normal.  Data Reviewed  I have personally reviewed the patient's imaging, laboratory findings and medical records.    Assessment/Plan 7-year-old female with signs and symptoms consistent with an incarcerated left inguinal hernia is in a complete bowel obstruction. Discussed with patient in detail about her disease process and about the need for emergent surgical intervention. I discussed with her the surgery in detail, an open left inguinal hernia with possibility of a laparotomy and the possibility of bowel resection. I have discussed with the or team and we'll go ahead and schedule the case as an emergency case. Risk, benefits  and possible complications including but not limited to: Bleeding, recurrence, chronic pain, injury to adjacent structures and infections were discussed with the patient in detail. She understands and wishes to proceed. Consent obtained. I'll go ahead and place an NG tube and give her 1 L saline bolus. Dr. Burt Knack will be the surgeon performing the operation since the case will probably start after 7 AM.   Caroleen Hamman, MD [facility] 01/15/2017, 6:34 AM

## 2017-01-16 MED ORDER — HYDROCODONE-ACETAMINOPHEN 5-325 MG PO TABS: 1.0000 | ORAL_TABLET | ORAL | 0 refills | Status: DC | PRN

## 2017-01-16 NOTE — Progress Notes (Signed)
1 Day Post-Op  Subjective: Patient feels well today she's having minimal pain.  Objective: Vital signs in last 24 hours: Temp:  [97.3 F (36.3 C)-98.8 F (37.1 C)] 98.2 F (36.8 C) (09/02 0431) Pulse Rate:  [66-75] 66 (09/02 0431) Resp:  [12-23] 18 (09/02 0431) BP: (108-138)/(51-65) 110/52 (09/02 0431) SpO2:  [91 %-100 %] 95 % (09/02 0431) Weight:  [149 lb 12.8 oz (67.9 kg)] 149 lb 12.8 oz (67.9 kg) (09/01 1300) Last BM Date: 01/14/17  Intake/Output from previous day: 09/01 0701 - 09/02 0700 In: 2164.2 [P.O.:240; I.V.:1924.2] Out: 4034 [Urine:3320; Blood:10] Intake/Output this shift: No intake/output data recorded.  Physical exam:  Wound is clean no erythema no drainage staples intact.  Lab Results: CBC   Recent Labs  01/15/17 0302  WBC 15.6*  HGB 14.5  HCT 42.4  PLT 564*   BMET  Recent Labs  01/15/17 0302  NA 134*  K 3.7  CL 97*  CO2 26  GLUCOSE 134*  BUN 11  CREATININE 0.60  CALCIUM 10.0   PT/INR  Recent Labs  01/15/17 0302  LABPROT 13.1  INR 1.00   ABG No results for input(s): PHART, HCO3 in the last 72 hours.  Invalid input(s): PCO2, PO2  Studies/Results: Ct Abdomen Pelvis W Contrast  Result Date: 01/15/2017 CLINICAL DATA:  Dark brown emesis and teres stools since 19:00. EXAM: CT ABDOMEN AND PELVIS WITH CONTRAST TECHNIQUE: Multidetector CT imaging of the abdomen and pelvis was performed using the standard protocol following bolus administration of intravenous contrast. CONTRAST:  133mL ISOVUE-300 IOPAMIDOL (ISOVUE-300) INJECTION 61% COMPARISON:  None. FINDINGS: Lower chest: No acute abnormality. Hepatobiliary: Tiny focal liver lesions, well under a cm, not characterized but more likely benign. Gallbladder and bile ducts are normal. Pancreas: Unremarkable. No pancreatic ductal dilatation or surrounding inflammatory changes. Spleen: Normal in size without focal abnormality. Adrenals/Urinary Tract: 2.7 cm left adrenal nodule. Right adrenal is  normal. Both kidneys are normal.  Ureters and urinary bladder are normal. Stomach/Bowel: Moderately large hiatal hernia. Marked dilatation of small bowel with mild mural thickening and mural enhancement. Abrupt caliber transition to decompressed small bowel, within a left inguinal hernia. Colon is notable only for sigmoid diverticulosis. Appendix is normal. Vascular/Lymphatic: The abdominal aorta is normal in caliber with no atherosclerotic calcification. No adenopathy in the abdomen or pelvis. Reproductive: Hysterectomy.  No adnexal abnormality. Other: No ascites. Musculoskeletal: No significant skeletal lesion. IMPRESSION: 1. High-grade small bowel obstruction within a left inguinal hernia. 2. Indeterminate 2.7 cm left adrenal nodule. Recommend adrenal protocol CT or MRI to characterize. 3. Colonic diverticulosis. 4. Hiatal hernia. 5. Subcentimeter focal low-attenuation liver lesions, not characterized but more likely benign. Electronically Signed   By: Andreas Newport M.D.   On: 01/15/2017 05:21    Anti-infectives: Anti-infectives    Start     Dose/Rate Route Frequency Ordered Stop   01/15/17 0700  cefoTEtan in Dextrose 5% (CEFOTAN) IVPB 2 g     2 g Intravenous  Once 01/15/17 0653 01/15/17 0827   01/15/17 0645  cefoTEtan (CEFOTAN) 2 g in dextrose 5 % 50 mL IVPB  Status:  Discontinued     2 g 100 mL/hr over 30 Minutes Intravenous  Once 01/15/17 0641 01/15/17 7425      Assessment/Plan: s/p Procedure(s): HERNIA REPAIR INGUINAL ADULT   Patient doing very well will discharge later today on oral analgesics. She may shower tomorrow after removing dressing tomorrow and follow-up in my office in 2 weeks for staple removal.  Florene Glen, MD,  FACS  01/16/2017

## 2017-01-16 NOTE — Discharge Instructions (Signed)
Resume home medications May shower Regular diet Follow up [facility] 2 weeks

## 2017-01-16 NOTE — Progress Notes (Signed)
Dr. Burt Knack ordered discharge home. Discharge instructions, prescriptions and instructions for follow up appointment was reviewed with patient. Questions were encouraged and answered. Ms. Avery verbalized agreement. Ms. Hedges also declined taking her scheduled medications at the hospital, stating that she would take them when she got home because "it's cheaper." Patient has gotten dressed and is waiting for her transportation to arrive to take her home.

## 2017-01-16 NOTE — Anesthesia Postprocedure Evaluation (Signed)
Anesthesia Post Note  Patient: Barbara Vega  Procedure(s) Performed: Procedure(s) (LRB): HERNIA REPAIR INGUINAL ADULT (Left)  Patient location during evaluation: PACU Anesthesia Type: General Level of consciousness: awake and alert and oriented Pain management: pain level controlled Vital Signs Assessment: post-procedure vital signs reviewed and stable Respiratory status: spontaneous breathing Cardiovascular status: blood pressure returned to baseline Anesthetic complications: no     Last Vitals:  Vitals:   01/15/17 1934 01/16/17 0431  BP: (!) 119/52 (!) 110/52  Pulse: 70 66  Resp: 19 18  Temp: 36.9 C 36.8 C  SpO2: 97% 95%    Last Pain:  Vitals:   01/16/17 0622  TempSrc:   PainSc: 7                  Malike Foglio

## 2017-01-17 ENCOUNTER — Encounter: Payer: Self-pay | Admitting: Surgery

## 2017-01-31 ENCOUNTER — Ambulatory Visit (INDEPENDENT_AMBULATORY_CARE_PROVIDER_SITE_OTHER): Payer: Medicare Other | Admitting: Surgery

## 2017-01-31 ENCOUNTER — Encounter: Payer: Self-pay | Admitting: Surgery

## 2017-01-31 VITALS — BP 146/77 | HR 80 | Temp 97.7°F | Ht 62.0 in | Wt 138.2 lb

## 2017-01-31 DIAGNOSIS — K4190 Unilateral femoral hernia, without obstruction or gangrene, not specified as recurrent: Secondary | ICD-10-CM

## 2017-01-31 NOTE — Progress Notes (Signed)
Outpatient postop visit  01/31/2017  Barbara Vega is an 83 y.o. female.    Procedure: Repair of incarcerated femoral hernia  CC: No pain  HPI: This patient underwent the repair of an incarcerated femoral hernia. Small bowel was present but not compromised wants the obstruction was released. A McVay repair was performed without mesh. Patient stopped taking narcotics last week and is only taking Tylenol on occasion. She is eating well. Having bowel movements but is somewhat constipated. Medications reviewed.    Physical Exam:  There were no vitals taken for this visit.    PE: Wound is clean no erythema staples are present staples are removedstrips are necessary. Soft nontender abdomen    Assessment/Plan:  This a patient who underwent a McVay type repair without mesh for an incarcerated femoral hernia with compromised bowel that had reversible ischemia once the obstruction was released. It was a very tight hernia. Patient is doing very well at this point and follow up on an as-needed basis. I discussed the signs of recurrence and if that were to happen we could discuss elective surgery at any time.  Florene Glen, MD, FACS

## 2017-01-31 NOTE — Patient Instructions (Signed)

## 2017-02-05 ENCOUNTER — Emergency Department: Payer: Medicare Other

## 2017-02-05 ENCOUNTER — Inpatient Hospital Stay
Admission: EM | Admit: 2017-02-05 | Discharge: 2017-02-06 | DRG: 641 | Disposition: A | Discharge status: Home / Self Care | Payer: Medicare Other | Attending: Specialist | Admitting: Specialist

## 2017-02-05 DIAGNOSIS — K5900 Constipation, unspecified: Secondary | ICD-10-CM | POA: Diagnosis present

## 2017-02-05 DIAGNOSIS — T502X5A Adverse effect of carbonic-anhydrase inhibitors, benzothiadiazides and other diuretics, initial encounter: Secondary | ICD-10-CM | POA: Diagnosis not present

## 2017-02-05 DIAGNOSIS — K219 Gastro-esophageal reflux disease without esophagitis: Secondary | ICD-10-CM | POA: Diagnosis not present

## 2017-02-05 DIAGNOSIS — M1990 Unspecified osteoarthritis, unspecified site: Secondary | ICD-10-CM | POA: Diagnosis present

## 2017-02-05 DIAGNOSIS — Z79899 Other long term (current) drug therapy: Secondary | ICD-10-CM

## 2017-02-05 DIAGNOSIS — E871 Hypo-osmolality and hyponatremia: Secondary | ICD-10-CM | POA: Diagnosis not present

## 2017-02-05 DIAGNOSIS — R112 Nausea with vomiting, unspecified: Secondary | ICD-10-CM

## 2017-02-05 DIAGNOSIS — Z7982 Long term (current) use of aspirin: Secondary | ICD-10-CM | POA: Diagnosis not present

## 2017-02-05 DIAGNOSIS — G4700 Insomnia, unspecified: Secondary | ICD-10-CM | POA: Diagnosis present

## 2017-02-05 DIAGNOSIS — I10 Essential (primary) hypertension: Secondary | ICD-10-CM | POA: Diagnosis not present

## 2017-02-05 DIAGNOSIS — Z885 Allergy status to narcotic agent status: Secondary | ICD-10-CM | POA: Diagnosis not present

## 2017-02-05 DIAGNOSIS — E876 Hypokalemia: Secondary | ICD-10-CM | POA: Diagnosis present

## 2017-02-05 DIAGNOSIS — H3530 Unspecified macular degeneration: Secondary | ICD-10-CM | POA: Diagnosis present

## 2017-02-05 DIAGNOSIS — M810 Age-related osteoporosis without current pathological fracture: Secondary | ICD-10-CM | POA: Diagnosis not present

## 2017-02-05 LAB — CBC
HCT: 38 % (ref 35.0–47.0)
Hemoglobin: 13.6 g/dL (ref 12.0–16.0)
MCH: 30.9 pg (ref 26.0–34.0)
MCHC: 35.9 g/dL (ref 32.0–36.0)
MCV: 86.3 fL (ref 80.0–100.0)
PLATELETS: 523 10*3/uL — AB (ref 150–440)
RBC: 4.4 MIL/uL (ref 3.80–5.20)
RDW: 13.2 % (ref 11.5–14.5)
WBC: 6.6 10*3/uL (ref 3.6–11.0)

## 2017-02-05 LAB — URINALYSIS, COMPLETE (UACMP) WITH MICROSCOPIC
Bacteria, UA: NONE SEEN
Bilirubin Urine: NEGATIVE
GLUCOSE, UA: NEGATIVE mg/dL
KETONES UR: 5 mg/dL — AB
LEUKOCYTES UA: NEGATIVE
Nitrite: NEGATIVE
PH: 7 (ref 5.0–8.0)
Protein, ur: NEGATIVE mg/dL
SQUAMOUS EPITHELIAL / LPF: NONE SEEN
Specific Gravity, Urine: 1.006 (ref 1.005–1.030)

## 2017-02-05 LAB — COMPREHENSIVE METABOLIC PANEL
ALK PHOS: 58 U/L (ref 38–126)
ALT: 8 U/L — AB (ref 14–54)
AST: 22 U/L (ref 15–41)
Albumin: 4.4 g/dL (ref 3.5–5.0)
Anion gap: 10 (ref 5–15)
BILIRUBIN TOTAL: 1.2 mg/dL (ref 0.3–1.2)
BUN: 10 mg/dL (ref 6–20)
CALCIUM: 9.7 mg/dL (ref 8.9–10.3)
CHLORIDE: 86 mmol/L — AB (ref 101–111)
CO2: 26 mmol/L (ref 22–32)
CREATININE: 0.57 mg/dL (ref 0.44–1.00)
Glucose, Bld: 123 mg/dL — ABNORMAL HIGH (ref 65–99)
Potassium: 3.1 mmol/L — ABNORMAL LOW (ref 3.5–5.1)
Sodium: 122 mmol/L — ABNORMAL LOW (ref 135–145)
TOTAL PROTEIN: 7 g/dL (ref 6.5–8.1)

## 2017-02-05 LAB — LIPASE, BLOOD: LIPASE: 28 U/L (ref 11–51)

## 2017-02-05 LAB — MAGNESIUM: Magnesium: 1.7 mg/dL (ref 1.7–2.4)

## 2017-02-05 MED ORDER — HYDRALAZINE HCL 20 MG/ML IJ SOLN: 10.0000 mg | Freq: Four times a day (QID) | INTRAMUSCULAR | Status: DC | PRN

## 2017-02-05 MED ORDER — LISINOPRIL 10 MG PO TABS
10.0000 mg | ORAL_TABLET | Freq: Every day | ORAL | Status: DC
  Administered 2017-02-05 – 2017-02-06 (×2): 10 mg via ORAL
  Filled 2017-02-05 (×2): qty 1

## 2017-02-05 MED ORDER — ENOXAPARIN SODIUM 40 MG/0.4ML ~~LOC~~ SOLN
40.0000 mg | SUBCUTANEOUS | Status: DC
  Administered 2017-02-05: 40 mg via SUBCUTANEOUS
  Filled 2017-02-05: qty 0.4

## 2017-02-05 MED ORDER — ACETAMINOPHEN 650 MG RE SUPP
650.0000 mg | Freq: Four times a day (QID) | RECTAL | Status: DC | PRN
Start: 2017-02-05 — End: 2017-02-06

## 2017-02-05 MED ORDER — IOPAMIDOL (ISOVUE-300) INJECTION 61%
15.0000 mL | INTRAVENOUS | Status: AC
  Administered 2017-02-05 (×2): 15 mL via ORAL

## 2017-02-05 MED ORDER — ONDANSETRON HCL 4 MG/2ML IJ SOLN
4.0000 mg | Freq: Once | INTRAMUSCULAR | Status: AC | PRN
  Administered 2017-02-05: 4 mg via INTRAVENOUS
  Filled 2017-02-05: qty 2

## 2017-02-05 MED ORDER — SODIUM CHLORIDE 0.9 % IV BOLUS (SEPSIS)
500.0000 mL | INTRAVENOUS | Status: AC
  Administered 2017-02-05: 500 mL via INTRAVENOUS

## 2017-02-05 MED ORDER — VITAMIN D 1000 UNITS PO TABS
1000.0000 [IU] | ORAL_TABLET | Freq: Every day | ORAL | Status: DC
  Administered 2017-02-05 – 2017-02-06 (×2): 1000 [IU] via ORAL
  Filled 2017-02-05 (×2): qty 1

## 2017-02-05 MED ORDER — ONDANSETRON HCL 4 MG/2ML IJ SOLN
4.0000 mg | Freq: Once | INTRAMUSCULAR | Status: AC
  Administered 2017-02-05: 4 mg via INTRAVENOUS
  Filled 2017-02-05: qty 2

## 2017-02-05 MED ORDER — SIMETHICONE 80 MG PO CHEW
80.0000 mg | CHEWABLE_TABLET | Freq: Four times a day (QID) | ORAL | Status: DC | PRN
  Administered 2017-02-05: 80 mg via ORAL
  Filled 2017-02-05 (×2): qty 1

## 2017-02-05 MED ORDER — POTASSIUM CHLORIDE 10 MEQ/100ML IV SOLN
INTRAVENOUS | Status: AC
  Administered 2017-02-05: 10 meq via INTRAVENOUS
  Filled 2017-02-05: qty 100

## 2017-02-05 MED ORDER — MAGNESIUM SULFATE 2 GM/50ML IV SOLN
2.0000 g | Freq: Once | INTRAVENOUS | Status: AC
  Administered 2017-02-05: 2 g via INTRAVENOUS
  Filled 2017-02-05 (×2): qty 50

## 2017-02-05 MED ORDER — PANTOPRAZOLE SODIUM 40 MG PO TBEC
40.0000 mg | DELAYED_RELEASE_TABLET | Freq: Every day | ORAL | Status: DC
  Administered 2017-02-05 – 2017-02-06 (×2): 40 mg via ORAL
  Filled 2017-02-05 (×3): qty 1

## 2017-02-05 MED ORDER — LORATADINE 10 MG PO TABS: 10.0000 mg | ORAL_TABLET | Freq: Every day | ORAL | Status: DC | PRN

## 2017-02-05 MED ORDER — TEMAZEPAM 15 MG PO CAPS
30.0000 mg | ORAL_CAPSULE | Freq: Every day | ORAL | Status: DC
  Administered 2017-02-05: 30 mg via ORAL
  Filled 2017-02-05: qty 2

## 2017-02-05 MED ORDER — ONDANSETRON HCL 4 MG/2ML IJ SOLN
4.0000 mg | Freq: Four times a day (QID) | INTRAMUSCULAR | Status: DC | PRN
  Administered 2017-02-05: 4 mg via INTRAVENOUS
  Filled 2017-02-05: qty 2

## 2017-02-05 MED ORDER — POTASSIUM CHLORIDE 10 MEQ/100ML IV SOLN
10.0000 meq | INTRAVENOUS | Status: AC
  Administered 2017-02-05 (×5): 10 meq via INTRAVENOUS
  Filled 2017-02-05 (×7): qty 100

## 2017-02-05 MED ORDER — ASPIRIN EC 81 MG PO TBEC
81.0000 mg | DELAYED_RELEASE_TABLET | Freq: Every day | ORAL | Status: DC
  Administered 2017-02-05 – 2017-02-06 (×2): 81 mg via ORAL
  Filled 2017-02-05 (×3): qty 1

## 2017-02-05 MED ORDER — SODIUM CHLORIDE 0.9 % IV SOLN
Freq: Once | INTRAVENOUS | Status: AC
  Administered 2017-02-05: 08:00:00 via INTRAVENOUS

## 2017-02-05 MED ORDER — POTASSIUM CHLORIDE CRYS ER 20 MEQ PO TBCR
20.0000 meq | EXTENDED_RELEASE_TABLET | Freq: Two times a day (BID) | ORAL | Status: AC
  Administered 2017-02-05 (×2): 20 meq via ORAL
  Filled 2017-02-05 (×2): qty 1

## 2017-02-05 MED ORDER — ONDANSETRON HCL 4 MG PO TABS: 4.0000 mg | ORAL_TABLET | Freq: Four times a day (QID) | ORAL | Status: DC | PRN

## 2017-02-05 MED ORDER — CALCIUM CARBONATE 1250 (500 CA) MG PO TABS
1.0000 | ORAL_TABLET | Freq: Every day | ORAL | Status: DC
  Filled 2017-02-05: qty 1

## 2017-02-05 MED ORDER — ACETAMINOPHEN 325 MG PO TABS
650.0000 mg | ORAL_TABLET | Freq: Four times a day (QID) | ORAL | Status: DC | PRN
  Administered 2017-02-05: 650 mg via ORAL
  Filled 2017-02-05: qty 2

## 2017-02-05 MED ORDER — SODIUM CHLORIDE 0.9 % IV SOLN
INTRAVENOUS | Status: DC
  Administered 2017-02-05 (×2): via INTRAVENOUS

## 2017-02-05 MED ORDER — IOPAMIDOL (ISOVUE-300) INJECTION 61%
85.0000 mL | Freq: Once | INTRAVENOUS | Status: AC | PRN
  Administered 2017-02-05: 85 mL via INTRAVENOUS

## 2017-02-05 NOTE — ED Notes (Signed)
Pt states she takes blood pressure medication but does not know name or milligrams.

## 2017-02-05 NOTE — ED Notes (Signed)
Report to felicia, rn.  

## 2017-02-05 NOTE — ED Notes (Signed)
Pt got up to bathroom without assistant. Pts IV came out. Pt cleaned up, gown changed.

## 2017-02-05 NOTE — ED Notes (Signed)
Pts bed reassigned.

## 2017-02-05 NOTE — ED Provider Notes (Signed)
[facility]  I accepted care from Dr. Karma Greaser ____________________________________________    LABS (pertinent positives/negatives)  Labs Reviewed  COMPREHENSIVE METABOLIC PANEL - Abnormal; Notable for the following:       Result Value   Sodium 122 (*)    Potassium 3.1 (*)    Chloride 86 (*)    Glucose, Bld 123 (*)    ALT 8 (*)    All other components within normal limits  CBC - Abnormal; Notable for the following:    Platelets 523 (*)    All other components within normal limits  LIPASE, BLOOD  MAGNESIUM  URINALYSIS, COMPLETE (UACMP) WITH MICROSCOPIC     ____________________________________________    RADIOLOGY All xrays were viewed by me. Imaging interpreted by radiologist. Result reviewed by myself, Dr. Reita Cliche MD.  Ct abd/pel:    CLINICAL DATA: Patient reports having surgery for small bowel obstruction 3 weeks ago. Pt c/o constipation, N/V. S/P inguinal hernia repair 3 weeks ago. HX hysterectomy And bilat lumpectomy. TKV^65mL ISOVUE-300 IOPAMIDOL (ISOVUE-300) INJECTION 61%  EXAM: CT ABDOMEN AND PELVIS WITH CONTRAST  TECHNIQUE: Multidetector CT imaging of the abdomen and pelvis was performed using the standard protocol following bolus administration of intravenous contrast.  CONTRAST: 24mL ISOVUE-300 IOPAMIDOL (ISOVUE-300) INJECTION 61%  COMPARISON: CT abdomen dated 01/15/2017. CT chest dated 07/29/2010.  FINDINGS: Lower chest: No acute abnormality. Small hiatal hernia.  Hepatobiliary: Small cysts within the left liver lobe. No.  Pancreas: Partially infiltrated with fat but otherwise unremarkable. No peripancreatic fluid.  Spleen: Normal in size without focal abnormality.  Adrenals/Urinary Tract: 2.4 cm left adrenal nodule, not significantly changed in appearance compared to the earlier chest CT of 07/29/2010 indicating benignity. Right adrenal gland appears normal. Kidneys are unremarkable without mass, stone  or hydronephrosis. No ureteral or bladder calculi. Mild bilateral pelviectasis related to moderate bladder distention.  Stomach/Bowel: Bowel is normal in caliber. No bowel wall thickening or evidence of bowel wall inflammation. Interval resolution of the small bowel obstruction. Appendix is normal.  Vascular/Lymphatic: No significant vascular findings are present. No enlarged abdominal or pelvic lymph nodes.  Reproductive: Status post hysterectomy. No adnexal masses.  Other: No free fluid or abscess collection. No free intraperitoneal air.  Musculoskeletal: Degenerative changes throughout the thoracolumbar spine, most pronounced within the lumbar spine, at least moderate in degree. No acute or suspicious osseous finding.  Expected surgical changes status post recent left inguinal hernia repair. Fluid collection within the overlying subcutaneous soft tissues, compatible with expected postsurgical seroma.  IMPRESSION: 1. Expected postsurgical changes status post left inguinal hernia repair. Expected postsurgical seroma within the subcutaneous soft tissues adjacent to the left inguinal canal. No evidence of surgical complicating feature. 2. Overall, no acute findings within the abdomen or pelvis. No evidence of bowel obstruction or bowel wall inflammation on today's exam. No CT evidence of constipation. Interval resolution of the previously demonstrated small bowel obstruction. No free fluid or abscess collection. No free intraperitoneal air. 3. Colonic diverticulosis without evidence of acute diverticulitis. 4. Small hiatal hernia. 5. Benign left adrenal mass, not significantly changed in appearance compared to earlier chest CT of 07/29/2010 indicating benignity. No follow-up imaging is necessary for this benign finding.       ____________________________________________   PROCEDURES  Procedure(s) performed: None  Critical Care performed:  None  ____________________________________________   INITIAL IMPRESSION / ASSESSMENT AND PLAN / ED COURSE   Pertinent labs & imaging results that were available during my care of the patient were reviewed by me and  considered in my medical decision making (see chart for details).  Pt with n/v and acute hyponatremia.  Recently post op - CT was pending for intra abd complication and is reassuring for no acute surgical finding.  Will admit to medicine.  CONSULTATIONS: Hospitalist for admission.    Patient / Family / Caregiver informed of clinical course, medical decision-making process, and agree with plan.     ____________________________________________   FINAL CLINICAL IMPRESSION(S) / ED DIAGNOSES  Final diagnoses:  Intractable vomiting with nausea, unspecified vomiting type  Constipation, unspecified constipation type  Hyponatremia  Hypokalemia        Lisa Roca, MD 02/05/17 [phone]

## 2017-02-05 NOTE — ED Notes (Signed)
Per ct tech pam, pt requesting additional pain medication. Order for zofran received from dr. Karma Greaser.

## 2017-02-05 NOTE — ED Provider Notes (Signed)
[facility] Emergency Department Provider Note  ____________________________________________   First MD Initiated Contact with Patient 02/05/17 0543     (approximate)  I have reviewed the triage vital signs and the nursing notes.   HISTORY  Chief Complaint Emesis and Constipation    HPI Barbara Vega is a 83 y.o. female with a history of a recent left inguinal hernia repair by Dr. Burt Knack which occurred about 3 weeks ago.  She presents tonight for evaluation of acute onset nausea and vomiting with some abdominal pain as well but she reports that the nausea is the worst part of her symptoms.  She states that she has been constipated for "quite a while".  About 4 or 5 days ago she took a bunch of laxatives and had diarrhea "all night long on Monday night" but then states that she has been constipated since then.she reports that she was nauseated a little bit yesterday but then during the night tonight she was severely nauseated and began having some episodes of vomiting.  She has vomited at least 3 times.  The symptoms are severe and nothing makes them better nor worse.  She has not been able to eat or drink very much since yesterday.  She denies fever/chills, chest pain, shortness of breath, and dysuria.  She states that her wound is healing well and she still has some pain in the area but not very much. she states that she feels distended.  She does feel generalized weakness.   Past Medical History:  Diagnosis Date  . Adenomatous colon polyp   . Anemia    hx  . Arthritis   . Benign breast lumps   . History of colon polyps    benign  . Hypertension    takes Triamterene-HCTZ daily  . Insomnia    takes Restoril nightly  . Macular degeneration    dry  . Osteoporosis   . Sinus infection    taking Ceftin daily     Patient Active Problem List   Diagnosis Date Noted  . Incarcerated left inguinal hernia   . Small bowel obstruction ([place])   . Hyponatremia  11/20/2016  . GERD (gastroesophageal reflux disease) 09/20/2016  . Dysphagia, unspecified 08/09/2016  . Papilloma of breast 04/03/2015  . Osteoporosis, post-menopausal 03/04/2015  . Cannot sleep 03/04/2015  . BP (high blood pressure) 03/04/2015  . Adenomatous colon polyp 03/04/2015    Past Surgical History:  Procedure Laterality Date  . ABDOMINAL HYSTERECTOMY    . BREAST LUMPECTOMY Right 09/08/2015   intraductal papilloma  . BREAST LUMPECTOMY Left    2 bx done only one scar seen years ago neg  . cataract surgery Bilateral   . COLONOSCOPY    . COLONOSCOPY WITH PROPOFOL N/A 11/03/2016   Procedure: COLONOSCOPY WITH PROPOFOL;  Surgeon: Manya Silvas, MD;  Location: [facility] ENDOSCOPY;  Service: Endoscopy;  Laterality: N/A;  . ESOPHAGOGASTRODUODENOSCOPY (EGD) WITH PROPOFOL N/A 11/03/2016   Procedure: ESOPHAGOGASTRODUODENOSCOPY (EGD) WITH PROPOFOL;  Surgeon: Manya Silvas, MD;  Location[facility] END[facility]COPY;  Service: Endoscopy;  Laterality: N/A;  . INGUINAL HERNIA REPAIR Left 01/15/2017   Procedure: HERNIA REPAIR INGUINAL ADULT;  Surgeon: Florene Glen, MD;  Location: [facility] ORS;  Service: General;  Laterality: Left;  . RADIOACTIVE SEED GUIDED EXCISIONAL BREAST BIOPSY Right 09/08/2015   Procedure: RADIOACTIVE SEED GUIDED EXCISIONAL BREAST BIOPSY;  Surgeon: Rolm Bookbinder, MD;  Location: [facility];  Service: General;  Laterality: Right;    Prior to Admission medications  Medication Sig Start Date End Date Taking? Authorizing Provider  aspirin EC 81 MG tablet Take 81 mg by mouth daily.   Yes [provider]  calcium carbonate (OS-CAL - DOSED IN MG OF ELEMENTAL CALCIUM) 1250 (500 CA) MG tablet Take 1 tablet by mouth daily with breakfast.    Yes [provider]  cetirizine (ZYRTEC) 10 MG tablet Take 10 mg by mouth daily as needed.    Yes [provider]  cholecalciferol (VITAMIN D) 1000 UNITS tablet Take 1,000 Units by mouth daily.   Yes [provider]    esomeprazole (NEXIUM) 20 MG capsule Take 20 mg by mouth daily as needed.    Yes [provider]  KRILL OIL PO Take 1 tablet by mouth daily.    Yes [provider]  temazepam (RESTORIL) 30 MG capsule Take 30 mg by mouth at bedtime.  09/13/14  Yes [provider]    Allergies Codone [hydrocodone]  Family History  Problem Relation Age of Onset  . Osteoporosis Mother   . Heart failure Mother   . Heart block Brother   . Alcohol abuse Brother   . Bladder Cancer Neg Hx   . Kidney cancer Neg Hx     Social History Social History  Substance Use Topics  . Smoking status: Never Smoker  . Smokeless tobacco: Never Used  . Alcohol use No    Review of Systems Constitutional: No fever/chills. Generalized weakness. Eyes: No visual changes. ENT: No sore throat. Cardiovascular: Denies chest pain. Respiratory: Denies shortness of breath. Gastrointestinal: severe nausea and several episodes of vomiting.  Abdominal distention and some pain but better than prior.  Constipation for extended period of time Genitourinary: Negative for dysuria. Musculoskeletal: Negative for neck pain.  Negative for back pain. Integumentary: Negative for rash. Neurological: Negative for headaches, focal weakness or numbness.   ____________________________________________   PHYSICAL EXAM:  VITAL SIGNS: ED Triage Vitals  Enc Vitals Group     BP 02/05/17 0449 (!) 157/78     Pulse Rate 02/05/17 0449 80     Resp 02/05/17 0449 15     Temp 02/05/17 0449 98 F (36.7 C)     Temp Source 02/05/17 0449 Oral     SpO2 02/05/17 0449 91 %     Weight 02/05/17 0451 62.6 kg (138 lb)     Height 02/05/17 0451 1.575 m (5\' 2" )     Head Circumference --      Peak Flow --      Pain Score --      Pain Loc --      Pain Edu? --      Excl. in [place]? --     Constitutional: Alert and oriented. no acute distress but does appear uncomfortable. Eyes: Conjunctivae are normal.  Head: Atraumatic. Nose: No  congestion/rhinnorhea. Mouth/Throat: Mucous membranes are moist. Neck: No stridor.  No meningeal signs.   Cardiovascular: Normal rate, regular rhythm. Good peripheral circulation. Grossly normal heart sounds. Respiratory: Normal respiratory effort.  No retractions. Lungs CTAB. Gastrointestinal: Soft with some mild tenderness to palpation at the site of her well appearing left inguinal hernia surgical wound, but otherwise there is no rebound or guarding and no distention and no generalized tenderness. Musculoskeletal: No lower extremity tenderness nor edema. No gross deformities of extremities. Neurologic:  Normal speech and language. No gross focal neurologic deficits are appreciated.  Skin:  Skin is warm, dry and intact. No rash noted.  ____________________________________________   LABS (all labs ordered  are listed, but only abnormal results are displayed)  Labs Reviewed  COMPREHENSIVE METABOLIC PANEL - Abnormal; Notable for the following:       Result Value   Sodium 122 (*)    Potassium 3.1 (*)    Chloride 86 (*)    Glucose, Bld 123 (*)    ALT 8 (*)    All other components within normal limits  CBC - Abnormal; Notable for the following:    Platelets 523 (*)    All other components within normal limits  LIPASE, BLOOD  URINALYSIS, COMPLETE (UACMP) WITH MICROSCOPIC  MAGNESIUM   ____________________________________________  EKG  None - EKG not ordered by ED physician ____________________________________________  RADIOLOGY  CT of the abdomen and pelvis is pending ____________________________________________   PROCEDURES  Critical Care performed: No   Procedure(s) performed:   Procedures   ____________________________________________   INITIAL IMPRESSION / ASSESSMENT AND PLAN / ED COURSE  Pertinent labs & imaging results that were available during my care of the patient were reviewed by me and considered in my medical decision making (see chart for details).    the patient had a history of multiple episodes of bowel obstruction and the most recent one resulted in the hernia surgery repair.  I find it unlikely that she has another SBO although ileus is possible, and it sounds as if she has been suffering from postoperative constipation which could also lead to an ileus.  Gastroparesis is also on the differential, intra-abdominal infection is less likely. her lab results are notable for a normal CBC and lipase but her metabolic panel shows some hypokalemia ( which could also be contributing to the constipation) as well as a sodium of 122.  I have ordered a small fluid bolus and we are obtaining a CT scan of her abdomen and pelvis with oral and IV contrast to try to best evaluate the possibility of an obstruction or ileus.  She has received 2 rounds of nausea medicine at this time.  if she is not able to tolerate anything by mouth she may require admission to the hospital for gastroparesis or intractable nausea/vomiting even if there are no surgical abnormalities on the CT scan.  Additionally she has a sodium of 122 with generalized weakness and I believe this is likely due to volume depletion but is consistent with symptomatic hyponatremia and she likely will benefit from admission for IV fluids and electrolyte replenishment.  I will add on a magnesium and order some replacement potassium slowly by IV.   7:49 AM I am transferring care to Dr. Reita Cliche to follow-up on the results of the CT scan and most likely admit the patient to the hospitalist unless there is a surgical issue.     ____________________________________________  FINAL CLINICAL IMPRESSION(S) / ED DIAGNOSES  Final diagnoses:  Intractable vomiting with nausea, unspecified vomiting type  Constipation, unspecified constipation type  Hyponatremia  Hypokalemia     MEDICATIONS GIVEN DURING THIS VISIT:  Medications  iopamidol (ISOVUE-300) 61 % injection 15 mL (15 mLs Oral Contrast Given 02/05/17  0536)  0.9 %  sodium chloride infusion (not administered)  potassium chloride 10 mEq in 100 mL IVPB (not administered)  ondansetron (ZOFRAN) injection 4 mg (4 mg Intravenous Given 02/05/17 0455)  sodium chloride 0.9 % bolus 500 mL (500 mLs Intravenous New Bag/Given 02/05/17 0547)  ondansetron (ZOFRAN) injection 4 mg (4 mg Intravenous Given 02/05/17 0547)  iopamidol (ISOVUE-300) 61 % injection 85 mL (85 mLs Intravenous Contrast Given 02/05/17 0748)  NEW OUTPATIENT MEDICATIONS STARTED DURING THIS VISIT:  New Prescriptions   No medications on file    Modified Medications   No medications on file    Discontinued Medications   MULTIPLE VITAMINS-MINERALS (PRESERVISION AREDS 2 PO)    Take 1 tablet by mouth 2 (two) times daily.   POTASSIUM CHLORIDE (K-DUR) 10 MEQ TABLET    One tab daily for three days     Note:  This document was prepared using Dragon voice recognition software and may include unintentional dictation errors.    Hinda Kehr, MD 02/05/17 [phone]

## 2017-02-05 NOTE — ED Notes (Signed)
Multiple attempts made to explain ct process and process of drinking po contrast given to pt and son.

## 2017-02-05 NOTE — ED Notes (Signed)
Patient's last BM 2 days ago. Patient last ate at 6pm yesterday, however vomited entire meal (peaches).

## 2017-02-05 NOTE — ED Triage Notes (Signed)
Patient reports having surgery for small bowel obstruction 3 weeks ago. Pt c/o constipation, N/V.

## 2017-02-05 NOTE — Plan of Care (Signed)
Problem: Activity: Goal: Risk for activity intolerance will decrease Outcome: Progressing Pt has been ambulating to bathroom  Problem: Fluid Volume: Goal: Ability to maintain a balanced intake and output will improve Outcome: Progressing Pt was able to eat lunch and tolerated well

## 2017-02-05 NOTE — H&P (Signed)
[facility] [place] NAME: Sonny Poth    MR#:  [id number]  DATE OF BIRTH:  27-Sep-1933  DATE OF ADMISSION:  02/05/2017  PRIMARY CARE PHYSICIAN: [name], MD   REQUESTING/REFERRING PHYSICIAN: Dr. Lisa Roca  CHIEF COMPLAINT:   Chief Complaint  Patient presents with  . Emesis  . Constipation    HISTORY OF PRESENT ILLNESS:  Rhea Kaelin  is a 83 y.o. female with a known history of Hypertension, macular degeneration, insomnia, osteoporosis, osteoarthritis, history of colonic polyps, diverticulosis who presents to the hospital due to persistent nausea and also noted to be severely hyponatremic. Patient says she's been feeling nauseated now for the past 2-3 days associated with some constipation. She presented to the hospital her symptoms are not improving, and her sodium was noted to be as low as 122. She denies any abdominal pain, fever, chills, chest pain, shortness of breath and had associated symptoms. Patient had a previous admission due to hyponatremia in July and at that time patient was told to stop taking her hydrochlorothiazide but apparently she has not stopped it and she still taking it. Due to her persistent nausea and use of diuretics she is noted to be severely hyponatremic and therefore hospitalist services were contacted further treatment and evaluation.  PAST MEDICAL HISTORY:   Past Medical History:  Diagnosis Date  . Adenomatous colon polyp   . Anemia    hx  . Arthritis   . Benign breast lumps   . History of colon polyps    benign  . Hypertension    takes Triamterene-HCTZ daily  . Insomnia    takes Restoril nightly  . Macular degeneration    dry  . Osteoporosis   . Sinus infection    taking Ceftin daily     PAST SURGICAL HISTORY:   Past Surgical History:  Procedure Laterality Date  . ABDOMINAL HYSTERECTOMY    . BREAST LUMPECTOMY Right 09/08/2015   intraductal papilloma  . BREAST LUMPECTOMY Left    2 bx  done only one scar seen years ago neg  . cataract surgery Bilateral   . COLONOSCOPY    . COLONOSCOPY WITH PROPOFOL N/A 11/03/2016   Procedure: COLONOSCOPY WITH PROPOFOL;  Surgeon: Manya Silvas, MD;  Location: [facility] ENDOSCOPY;  Service: Endoscopy;  Laterality: N/A;  . ESOPHAGOGASTRODUODENOSCOPY (EGD) WITH PROPOFOL N/A 11/03/2016   Procedure: ESOPHAGOGASTRODUODENOSCOPY (EGD) WITH PROPOFOL;  Surgeon: Manya Silvas, MD;  Location: [facility] ENDOSCOPY;  Service: Endoscopy;  Laterality: N/A;  . INGUINAL HERNIA REPAIR Left 01/15/2017   Procedure: HERNIA REPAIR INGUINAL ADULT;  Surgeon: Florene Glen, MD;  Location: [facility] ORS;  Service: General;  Laterality: Left;  . RADIOACTIVE SEED GUIDED EXCISIONAL BREAST BIOPSY Right 09/08/2015   Procedure: RADIOACTIVE SEED GUIDED EXCISIONAL BREAST BIOPSY;  Surgeon: Rolm Bookbinder, MD;  Location: [place];  Service: General;  Laterality: Right;    SOCIAL HISTORY:   Social History  Substance Use Topics  . Smoking status: Never Smoker  . Smokeless tobacco: Never Used  . Alcohol use No    FAMILY HISTORY:   Family History  Problem Relation Age of Onset  . Osteoporosis Mother   . Heart failure Mother   . Heart block Brother   . Alcohol abuse Brother   . Bladder Cancer Neg Hx   . Kidney cancer Neg Hx     DRUG ALLERGIES:   Allergies  Allergen Reactions  . Codone [Hydrocodone] Nausea And Vomiting    REVIEW OF  SYSTEMS:   Review of Systems  Constitutional: Negative for fever and weight loss.  HENT: Negative for congestion, nosebleeds and tinnitus.   Eyes: Negative for blurred vision, double vision and redness.  Respiratory: Negative for cough, hemoptysis and shortness of breath.   Cardiovascular: Negative for chest pain, orthopnea, leg swelling and PND.  Gastrointestinal: Positive for nausea and vomiting. Negative for abdominal pain, diarrhea and melena.  Genitourinary: Negative for dysuria, hematuria and urgency.  Musculoskeletal: Negative for  falls and joint pain.  Neurological: Negative for dizziness, tingling, sensory change, focal weakness, seizures, weakness and headaches.  Endo/Heme/Allergies: Negative for polydipsia. Does not bruise/bleed easily.  Psychiatric/Behavioral: Negative for depression and memory loss. The patient is not nervous/anxious.     MEDICATIONS AT HOME:   Prior to Admission medications   Medication Sig Start Date End Date Taking? Authorizing Provider  aspirin EC 81 MG tablet Take 81 mg by mouth daily.   Yes [provider]  calcium carbonate (OS-CAL - DOSED IN MG OF ELEMENTAL CALCIUM) 1250 (500 CA) MG tablet Take 1 tablet by mouth daily with breakfast.    Yes [provider]  cetirizine (ZYRTEC) 10 MG tablet Take 10 mg by mouth daily as needed.    Yes [provider]  cholecalciferol (VITAMIN D) 1000 UNITS tablet Take 1,000 Units by mouth daily.   Yes [provider]  esomeprazole (NEXIUM) 20 MG capsule Take 20 mg by mouth daily as needed.    Yes [provider]  KRILL OIL PO Take 1 tablet by mouth daily.    Yes [provider]  temazepam (RESTORIL) 30 MG capsule Take 30 mg by mouth at bedtime.  09/13/14  Yes [provider]      VITAL SIGNS:  Blood pressure (!) 164/89, pulse 81, temperature 98 F (36.7 C), temperature source Oral, resp. rate 15, height 5\' 2"  (1.575 m), weight 62.6 kg (138 lb), SpO2 95 %.  PHYSICAL EXAMINATION:  Physical Exam  GENERAL:  83 y.o.-year-old patient lying in the bed in no acute distress.  EYES: Pupils equal, round, reactive to light and accommodation. No scleral icterus. Extraocular muscles intact.  HEENT: Head atraumatic, normocephalic. Oropharynx and nasopharynx clear. No oropharyngeal erythema, moist oral mucosa  NECK:  Supple, no jugular venous distention. No thyroid enlargement, no tenderness.  LUNGS: Normal breath sounds bilaterally, no wheezing, rales, rhonchi. No use of accessory muscles of respiration.   CARDIOVASCULAR: S1, S2 RRR. No murmurs, rubs, gallops, clicks.  ABDOMEN: Soft, nontender, nondistended. Bowel sounds present. No organomegaly or mass.  EXTREMITIES: No pedal edema, cyanosis, or clubbing. + 2 pedal & radial pulses b/l.   NEUROLOGIC: Cranial nerves II through XII are intact. No focal Motor or sensory deficits appreciated b/l PSYCHIATRIC: The patient is alert and oriented x 3. SKIN: No obvious rash, lesion, or ulcer.   LABORATORY PANEL:   CBC  Recent Labs Lab 02/05/17 0452  WBC 6.6  HGB 13.6  HCT 38.0  PLT 523*   ------------------------------------------------------------------------------------------------------------------  Chemistries   Recent Labs Lab 02/05/17 0452  NA 122*  K 3.1*  CL 86*  CO2 26  GLUCOSE 123*  BUN 10  CREATININE 0.57  CALCIUM 9.7  MG 1.7  AST 22  ALT 8*  ALKPHOS 58  BILITOT 1.2   ------------------------------------------------------------------------------------------------------------------  Cardiac Enzymes No results for input(s): TROPONINI in the last 168 hours. ------------------------------------------------------------------------------------------------------------------  RADIOLOGY:  Ct Abdomen Pelvis W Contrast  Result Date: 02/05/2017 CLINICAL DATA:  Patient reports having surgery for small bowel obstruction  3 weeks ago. Pt c/o constipation, N/V. S/P inguinal hernia repair 3 weeks ago. HX hysterectomy And bilat lumpectomy. TKV^42mL ISOVUE-300 IOPAMIDOL (ISOVUE-300) INJECTION 61% EXAM: CT ABDOMEN AND PELVIS WITH CONTRAST TECHNIQUE: Multidetector CT imaging of the abdomen and pelvis was performed using the standard protocol following bolus administration of intravenous contrast. CONTRAST:  78mL ISOVUE-300 IOPAMIDOL (ISOVUE-300) INJECTION 61% COMPARISON:  CT abdomen dated 01/15/2017. CT chest dated 07/29/2010. FINDINGS: Lower chest: No acute abnormality.  Small hiatal hernia. Hepatobiliary: Small cysts within the left  liver lobe.  No. Pancreas: Partially infiltrated with fat but otherwise unremarkable. No peripancreatic fluid. Spleen: Normal in size without focal abnormality. Adrenals/Urinary Tract: 2.4 cm left adrenal nodule, not significantly changed in appearance compared to the earlier chest CT of 07/29/2010 indicating benignity. Right adrenal gland appears normal. Kidneys are unremarkable without mass, stone or hydronephrosis. No ureteral or bladder calculi. Mild bilateral pelviectasis related to moderate bladder distention. Stomach/Bowel: Bowel is normal in caliber. No bowel wall thickening or evidence of bowel wall inflammation. Interval resolution of the small bowel obstruction. Appendix is normal. Vascular/Lymphatic: No significant vascular findings are present. No enlarged abdominal or pelvic lymph nodes. Reproductive: Status post hysterectomy. No adnexal masses. Other: No free fluid or abscess collection. No free intraperitoneal air. Musculoskeletal: Degenerative changes throughout the thoracolumbar spine, most pronounced within the lumbar spine, at least moderate in degree. No acute or suspicious osseous finding. Expected surgical changes status post recent left inguinal hernia repair. Fluid collection within the overlying subcutaneous soft tissues, compatible with expected postsurgical seroma. IMPRESSION: 1. Expected postsurgical changes status post left inguinal hernia repair. Expected postsurgical seroma within the subcutaneous soft tissues adjacent to the left inguinal canal. No evidence of surgical complicating feature. 2. Overall, no acute findings within the abdomen or pelvis. No evidence of bowel obstruction or bowel wall inflammation on today's exam. No CT evidence of constipation. Interval resolution of the previously demonstrated small bowel obstruction. No free fluid or abscess collection. No free intraperitoneal air. 3. Colonic diverticulosis without evidence of acute diverticulitis. 4. Small hiatal  hernia. 5. Benign left adrenal mass, not significantly changed in appearance compared to earlier chest CT of 07/29/2010 indicating benignity. No follow-up imaging is necessary for this benign finding. Electronically Signed   By: Franki Cabot M.D.   On: 02/05/2017 08:43     IMPRESSION AND PLAN:   83 year old female with past medical history of hypertension, osteoporosis, osteoarthritis, history of colonic polyps, diverticulosis, recent surgery for incarcerated left-sided inguinal hernia repair who presents to the hospital due to persistent nausea and noted to be hyponatremic.  1. Hyponatremia-secondary to patient's nausea/vomiting combined with the use of diuretics. -We'll hold her diuretics for now, gently hydrate with IV fluids, follow sodium.  2. Nausea-suspected to be secondary to constipation. CT scan of the abdomen pelvis shows no use of bowel obstruction, diverticulitis or any other acute pathology. Continue supportive care with antibiotics and we'll follow clinically.  3. Essential hypertension-patient is on triamterene HCTZ which she has been taking despite the fact that she was told to stop taking it in July. I will hold her diuretics for now given her hyponatremia. Start her on some lisinopril. Place on when necessary hydralazine. Follow blood pressure.  4. GERD-continue Protonix.  5. Insomnia-continue temazepam.  6. Osteoporosis-continue calcium and vitamin D supplements.    All the records are reviewed and case discussed with ED provider. Management plans discussed with the patient, family and they are in agreement.  CODE STATUS: Full code  TOTAL TIME  TAKING CARE OF THIS PATIENT: 45 minutes.    Henreitta Leber M.D on 02/05/2017 at 9:50 AM  Between 7am to 6pm - Pager - [phone]  After 6pm go to [email or web address] - password [email or web address] [facility]  [facility]  Office  [phone]  CC: Primary care physician; [name], MD

## 2017-02-06 DIAGNOSIS — E876 Hypokalemia: Secondary | ICD-10-CM | POA: Diagnosis not present

## 2017-02-06 DIAGNOSIS — E871 Hypo-osmolality and hyponatremia: Secondary | ICD-10-CM | POA: Diagnosis not present

## 2017-02-06 LAB — BASIC METABOLIC PANEL
ANION GAP: 5 (ref 5–15)
BUN: 5 mg/dL — AB (ref 6–20)
CHLORIDE: 103 mmol/L (ref 101–111)
CO2: 24 mmol/L (ref 22–32)
Calcium: 8.6 mg/dL — ABNORMAL LOW (ref 8.9–10.3)
Creatinine, Ser: 0.44 mg/dL (ref 0.44–1.00)
GFR calc Af Amer: >60 mL/min (ref >60)
GFR calc non Af Amer: >60 mL/min (ref >60)
Glucose, Bld: 89 mg/dL (ref 65–99)
POTASSIUM: 4.3 mmol/L (ref 3.5–5.1)
SODIUM: 132 mmol/L — AB (ref 135–145)

## 2017-02-06 LAB — CBC
HEMATOCRIT: 34.2 % — AB (ref 35.0–47.0)
HEMOGLOBIN: 12.1 g/dL (ref 12.0–16.0)
MCH: 31.2 pg (ref 26.0–34.0)
MCHC: 35.5 g/dL (ref 32.0–36.0)
MCV: 88 fL (ref 80.0–100.0)
Platelets: 459 10*3/uL — ABNORMAL HIGH (ref 150–440)
RBC: 3.88 MIL/uL (ref 3.80–5.20)
RDW: 13.5 % (ref 11.5–14.5)
WBC: 5.7 10*3/uL (ref 3.6–11.0)

## 2017-02-06 MED ORDER — CALCIUM CARBONATE ANTACID 500 MG PO CHEW
500.0000 mg | CHEWABLE_TABLET | Freq: Every day | ORAL | Status: DC
  Administered 2017-02-06: 500 mg via ORAL
  Filled 2017-02-06: qty 3

## 2017-02-06 MED ORDER — LISINOPRIL 5 MG PO TABS: 5.0000 mg | ORAL_TABLET | Freq: Every day | ORAL | 1 refills | Status: DC

## 2017-02-06 NOTE — Progress Notes (Signed)
02/06/2017  12:09 PM  Barbara Vega to be D/C'd Home per MD order.  Discussed prescriptions and follow up appointments with the patient. Prescriptions given to patient, medication list explained in detail. Pt verbalized understanding.  Allergies as of 02/06/2017      Reactions   Codone [hydrocodone] Nausea And Vomiting      Medication List    TAKE these medications   aspirin EC 81 MG tablet Take 81 mg by mouth daily.   calcium carbonate 1250 (500 Ca) MG tablet Commonly known as:  OS-CAL - dosed in mg of elemental calcium Take 1 tablet by mouth daily with breakfast.   cetirizine 10 MG tablet Commonly known as:  ZYRTEC Take 10 mg by mouth daily as needed.   cholecalciferol 1000 units tablet Commonly known as:  VITAMIN D Take 1,000 Units by mouth daily.   esomeprazole 20 MG capsule Commonly known as:  NEXIUM Take 20 mg by mouth daily as needed.   KRILL OIL PO Take 1 tablet by mouth daily.   lisinopril 5 MG tablet Commonly known as:  PRINIVIL,ZESTRIL Take 1 tablet (5 mg total) by mouth daily.   temazepam 30 MG capsule Commonly known as:  RESTORIL Take 30 mg by mouth at bedtime.            Discharge Care Instructions        Start     Ordered   02/07/17 0000  lisinopril (PRINIVIL,ZESTRIL) 5 MG tablet  Daily     02/06/17 0933   02/06/17 0000  Activity as tolerated - No restrictions     02/06/17 0933   02/06/17 0000  Diet - low sodium heart healthy     02/06/17 0933      Vitals:   02/06/17 0445 02/06/17 0549  BP: (!) 102/43 (!) 107/57  Pulse: 69 62  Resp: 18   Temp: 98 F (36.7 C)   SpO2: 94%     Skin clean, dry and intact without evidence of skin break down, no evidence of skin tears noted. IV catheter discontinued intact. Site without signs and symptoms of complications. Dressing and pressure applied. Pt denies pain at this time. No complaints noted.  An After Visit Summary was printed and given to the patient. Patient escorted via [place], and D/C home  via private auto.  Dola Argyle

## 2017-02-06 NOTE — Discharge Summary (Signed)
[facility] [place] NAME: Barbara Vega    MR#:  [id number]  DATE OF BIRTH:  [birth year]  DATE OF ADMISSION:  02/05/2017 ADMITTING PHYSICIAN: Henreitta Leber, MD  DATE OF DISCHARGE: 02/06/2017  PRIMARY CARE PHYSICIAN: [name], MD    ADMISSION DIAGNOSIS:  Hypokalemia [E87.6] Hyponatremia [E87.1] Constipation, unspecified constipation type [K59.00] Intractable vomiting with nausea, unspecified vomiting type [R11.2]  DISCHARGE DIAGNOSIS:  Active Problems:   Hyponatremia   SECONDARY DIAGNOSIS:   Past Medical History:  Diagnosis Date  . Adenomatous colon polyp   . Anemia    hx  . Arthritis   . Benign breast lumps   . History of colon polyps    benign  . Hypertension    takes Triamterene-HCTZ daily  . Insomnia    takes Restoril nightly  . Macular degeneration    dry  . Osteoporosis   . Sinus infection    taking Ceftin daily     HOSPITAL COURSE:   83 year old female with past medical history of hypertension, osteoporosis, osteoarthritis, history of colonic polyps, diverticulosis, recent surgery for incarcerated left-sided inguinal hernia repair who presents to the hospital due to persistent nausea and noted to be hyponatremic.  1. Hyponatremia-secondary to patient's nausea/vomiting combined with the use of diuretics. - patient was hydrated with IV fluids and her sodium was improved and is 132 on discharge. She is clinically asymptomatic.  2. Nausea-suspected to be secondary to constipation. CT scan of the abdomen pelvis shows no use of bowel obstruction, diverticulitis or any other acute pathology. -patient was treated supportively with antiemetics and fluids and has improved. She has no further nausea or vomiting, she tolerated her breakfast welland therefore is being discharged home.  3. Essential hypertension- patient was on triamterene/HCTZ which is being discontinued given her recurrent hyponatremia. She was  discharged on low-dose lisinopril. Further changes to her antihypertensive regimen can be done by her primary care physician.  4. GERD- she will continue her nexium.  5. Insomnia- she will continue temazepam.  6. Osteoporosis- she will continue calcium and vitamin D supplements.  DISCHARGE CONDITIONS:   Stable.   CONSULTS OBTAINED:    DRUG ALLERGIES:   Allergies  Allergen Reactions  . Codone [Hydrocodone] Nausea And Vomiting    DISCHARGE MEDICATIONS:   Allergies as of 02/06/2017      Reactions   Codone [hydrocodone] Nausea And Vomiting      Medication List    TAKE these medications   aspirin EC 81 MG tablet Take 81 mg by mouth daily.   calcium carbonate 1250 (500 Ca) MG tablet Commonly known as:  OS-CAL - dosed in mg of elemental calcium Take 1 tablet by mouth daily with breakfast.   cetirizine 10 MG tablet Commonly known as:  ZYRTEC Take 10 mg by mouth daily as needed.   cholecalciferol 1000 units tablet Commonly known as:  VITAMIN D Take 1,000 Units by mouth daily.   esomeprazole 20 MG capsule Commonly known as:  NEXIUM Take 20 mg by mouth daily as needed.   KRILL OIL PO Take 1 tablet by mouth daily.   lisinopril 5 MG tablet Commonly known as:  PRINIVIL,ZESTRIL Take 1 tablet (5 mg total) by mouth daily.   temazepam 30 MG capsule Commonly known as:  RESTORIL Take 30 mg by mouth at bedtime.            Discharge Care Instructions        Start  Ordered   02/07/17 0000  lisinopril (PRINIVIL,ZESTRIL) 5 MG tablet  Daily     02/06/17 0933   02/06/17 0000  Activity as tolerated - No restrictions     02/06/17 0933   02/06/17 0000  Diet - low sodium heart healthy     02/06/17 0933        DISCHARGE INSTRUCTIONS:   DIET:  Cardiac diet  DISCHARGE CONDITION:  Stable  ACTIVITY:  Activity as tolerated  OXYGEN:  Home Oxygen: No.   Oxygen Delivery: room air  DISCHARGE LOCATION:  home   If you experience worsening of your  admission symptoms, develop shortness of breath, life threatening emergency, suicidal or homicidal thoughts you must seek medical attention immediately by calling 911 or calling your MD immediately  if symptoms less severe.  You Must read complete instructions/literature along with all the possible adverse reactions/side effects for all the Medicines you take and that have been prescribed to you. Take any new Medicines after you have completely understood and accpet all the possible adverse reactions/side effects.   Please note  You were cared for by a hospitalist during your hospital stay. If you have any questions about your discharge medications or the care you received while you were in the hospital after you are discharged, you can call the unit and asked to speak with the hospitalist on call if the hospitalist that took care of you is not available. Once you are discharged, your primary care physician will handle any further medical issues. Please note that NO REFILLS for any discharge medications will be authorized once you are discharged, as it is imperative that you return to your primary care physician (or establish a relationship with a primary care physician if you do not have one) for your aftercare needs so that they can reassess your need for medications and monitor your lab values.     Today   No further N/V.  Tolerating PO well. BP stable.  Sodium level improved and will d/c home today.   VITAL SIGNS:  Blood pressure (!) 107/57, pulse 62, temperature 98 F (36.7 C), temperature source Oral, resp. rate 18, height 5\' 2"  (1.575 m), weight 62.6 kg (138 lb), SpO2 94 %.  I/O:   Intake/Output Summary (Last 24 hours) at 02/06/17 1217 Last data filed at 02/06/17 5573  Gross per 24 hour  Intake          1542.51 ml  Output             2100 ml  Net          -557.49 ml    PHYSICAL EXAMINATION:  GENERAL:  83 y.o.-year-old patient lying in the bed with no acute distress.  EYES:  Pupils equal, round, reactive to light and accommodation. No scleral icterus. Extraocular muscles intact.  HEENT: Head atraumatic, normocephalic. Oropharynx and nasopharynx clear.  NECK:  Supple, no jugular venous distention. No thyroid enlargement, no tenderness.  LUNGS: Normal breath sounds bilaterally, no wheezing, rales,rhonchi. No use of accessory muscles of respiration.  CARDIOVASCULAR: S1, S2 normal. No murmurs, rubs, or gallops.  ABDOMEN: Soft, non-tender, non-distended. Bowel sounds present. No organomegaly or mass.  EXTREMITIES: No pedal edema, cyanosis, or clubbing.  NEUROLOGIC: Cranial nerves II through XII are intact. No focal motor or sensory defecits b/l.  PSYCHIATRIC: The patient is alert and oriented x 3. Good affect.  SKIN: No obvious rash, lesion, or ulcer.   DATA REVIEW:   CBC  Recent Labs Lab 02/06/17 [phone]  WBC 5.7  HGB 12.1  HCT 34.2*  PLT 459*    Chemistries   Recent Labs Lab 02/05/17 0452 02/06/17 0511  NA 122* 132*  K 3.1* 4.3  CL 86* 103  CO2 26 24  GLUCOSE 123* 89  BUN 10 5*  CREATININE 0.57 0.44  CALCIUM 9.7 8.6*  MG 1.7  --   AST 22  --   ALT 8*  --   ALKPHOS 58  --   BILITOT 1.2  --     Cardiac Enzymes No results for input(s): TROPONINI in the last 168 hours.  Microbiology Results  Results for orders placed or performed in visit on 12/20/16  Microscopic Examination     Status: Abnormal   Collection Time: 12/20/16  1:04 PM  Result Value Ref Range Status   WBC, UA 0-5 0 - 5 /hpf Final   RBC, UA 0-2 0 - 2 /hpf Final   Epithelial Cells (non renal) 0-10 0 - 10 /hpf Final   Casts Present (A) None seen /lpf Final   Cast Type Hyaline casts N/A Final   Mucus, UA Present (A) Not Estab. Final   Bacteria, UA Few (A) None seen/Few Final    RADIOLOGY:  Ct Abdomen Pelvis W Contrast  Result Date: 02/05/2017 CLINICAL DATA:  Patient reports having surgery for small bowel obstruction 3 weeks ago. Pt c/o constipation, N/V. S/P inguinal hernia  repair 3 weeks ago. HX hysterectomy And bilat lumpectomy. TKV^63mL ISOVUE-300 IOPAMIDOL (ISOVUE-300) INJECTION 61% EXAM: CT ABDOMEN AND PELVIS WITH CONTRAST TECHNIQUE: Multidetector CT imaging of the abdomen and pelvis was performed using the standard protocol following bolus administration of intravenous contrast. CONTRAST:  69mL ISOVUE-300 IOPAMIDOL (ISOVUE-300) INJECTION 61% COMPARISON:  CT abdomen dated 01/15/2017. CT chest dated 07/29/2010. FINDINGS: Lower chest: No acute abnormality.  Small hiatal hernia. Hepatobiliary: Small cysts within the left liver lobe.  No. Pancreas: Partially infiltrated with fat but otherwise unremarkable. No peripancreatic fluid. Spleen: Normal in size without focal abnormality. Adrenals/Urinary Tract: 2.4 cm left adrenal nodule, not significantly changed in appearance compared to the earlier chest CT of 07/29/2010 indicating benignity. Right adrenal gland appears normal. Kidneys are unremarkable without mass, stone or hydronephrosis. No ureteral or bladder calculi. Mild bilateral pelviectasis related to moderate bladder distention. Stomach/Bowel: Bowel is normal in caliber. No bowel wall thickening or evidence of bowel wall inflammation. Interval resolution of the small bowel obstruction. Appendix is normal. Vascular/Lymphatic: No significant vascular findings are present. No enlarged abdominal or pelvic lymph nodes. Reproductive: Status post hysterectomy. No adnexal masses. Other: No free fluid or abscess collection. No free intraperitoneal air. Musculoskeletal: Degenerative changes throughout the thoracolumbar spine, most pronounced within the lumbar spine, at least moderate in degree. No acute or suspicious osseous finding. Expected surgical changes status post recent left inguinal hernia repair. Fluid collection within the overlying subcutaneous soft tissues, compatible with expected postsurgical seroma. IMPRESSION: 1. Expected postsurgical changes status post left inguinal  hernia repair. Expected postsurgical seroma within the subcutaneous soft tissues adjacent to the left inguinal canal. No evidence of surgical complicating feature. 2. Overall, no acute findings within the abdomen or pelvis. No evidence of bowel obstruction or bowel wall inflammation on today's exam. No CT evidence of constipation. Interval resolution of the previously demonstrated small bowel obstruction. No free fluid or abscess collection. No free intraperitoneal air. 3. Colonic diverticulosis without evidence of acute diverticulitis. 4. Small hiatal hernia. 5. Benign left adrenal mass, not significantly changed in appearance compared to earlier chest CT of 07/29/2010  indicating benignity. No follow-up imaging is necessary for this benign finding. Electronically Signed   By: Franki Cabot M.D.   On: 02/05/2017 08:43      Management plans discussed with the patient, family and they are in agreement.  CODE STATUS:     Code Status Orders        Start     Ordered   02/05/17 1143  Full code  Continuous     02/05/17 1142  Advance Directive Documentation     Most Recent Value  Type of Advance Directive  Healthcare Power of Attorney, Living will  Pre-existing out of facility DNR order (yellow form or pink MOST form)  -  "MOST" Form in Place?  -      TOTAL TIME TAKING CARE OF THIS PATIENT: 40 minutes.    Henreitta Leber M.D on 02/06/2017 at 12:17 PM  Between 7am to 6pm - Pager - [phone]  After 6pm go to [email or web address] - Proofreader  [facility]  Office  [phone]  CC: Primary care physician; [name], MD

## 2017-06-08 ENCOUNTER — Encounter: Payer: Self-pay | Admitting: Surgery

## 2017-06-08 ENCOUNTER — Ambulatory Visit: Payer: Medicare Other | Admitting: Surgery

## 2017-06-08 VITALS — BP 172/82 | HR 65 | Temp 98.0°F | Ht 62.0 in | Wt 143.2 lb

## 2017-06-08 DIAGNOSIS — K4190 Unilateral femoral hernia, without obstruction or gangrene, not specified as recurrent: Secondary | ICD-10-CM

## 2017-06-08 NOTE — Progress Notes (Signed)
Outpatient Surgical Follow Up  06/08/2017  Barbara Vega is an 85 y.o. female.   CC: Left groin pain  HPI: Patient is status post repair of an incarcerated left inguinal hernia with bowel obstruction.  No mesh was utilized due to the presence of bowel at the time.  Now she describes minimal discomfort but no pain to change her lifestyle.  She is not noticing any bulges.  This is not affected her ability to do her activities of daily living.  She does have a back surgery scheduled and just wanted to be sure there was nothing else occurring here.  Past Medical History:  Diagnosis Date  . Adenomatous colon polyp   . Anemia    hx  . Arthritis   . Benign breast lumps   . History of colon polyps    benign  . Hypertension    takes Triamterene-HCTZ daily  . Insomnia    takes Restoril nightly  . Macular degeneration    dry  . Osteoporosis   . Sinus infection    taking Ceftin daily     Past Surgical History:  Procedure Laterality Date  . ABDOMINAL HYSTERECTOMY    . BREAST LUMPECTOMY Right 09/08/2015   intraductal papilloma  . BREAST LUMPECTOMY Left    2 bx done only one scar seen years ago neg  . cataract surgery Bilateral   . COLONOSCOPY    . COLONOSCOPY WITH PROPOFOL N/A 11/03/2016   Procedure: COLONOSCOPY WITH PROPOFOL;  Surgeon: Manya Silvas, MD;  Location: [facility] ENDOSCOPY;  Service: Endoscopy;  Laterality: N/A;  . ESOPHAGOGASTRODUODENOSCOPY (EGD) WITH PROPOFOL N/A 11/03/2016   Procedure: ESOPHAGOGASTRODUODENOSCOPY (EGD) WITH PROPOFOL;  Surgeon: Manya Silvas, MD;  Location: [facility] ENDOSCOPY;  Service: Endoscopy;  Laterality: N/A;  . INGUINAL HERNIA REPAIR Left 01/15/2017   Procedure: HERNIA REPAIR INGUINAL ADULT;  Surgeon: Florene Glen, MD;  Location: [facility] ORS;  Service: General;  Laterality: Left;  . RADIOACTIVE SEED GUIDED EXCISIONAL BREAST BIOPSY Right 09/08/2015   Procedure: RADIOACTIVE SEED GUIDED EXCISIONAL BREAST BIOPSY;  Surgeon: Rolm Bookbinder, MD;   Location: [facility];  Service: General;  Laterality: Right;    Family History  Problem Relation Age of Onset  . Osteoporosis Mother   . Heart failure Mother   . Heart block Brother   . Alcohol abuse Brother   . Bladder Cancer Neg Hx   . Kidney cancer Neg Hx     Social History:  reports that  has never smoked. she has never used smokeless tobacco. She reports that she does not drink alcohol or use drugs.  Allergies:  Allergies  Allergen Reactions  . Codone [Hydrocodone] Nausea And Vomiting    Medications reviewed.   Review of Systems:   Review of Systems  Constitutional: Negative.   HENT: Negative.   Eyes: Negative.   Respiratory: Negative.   Cardiovascular: Negative.   Gastrointestinal: Positive for abdominal pain. Negative for heartburn, nausea and vomiting.  Genitourinary: Negative.   Musculoskeletal: Negative.   Skin: Negative.   Neurological: Negative.   Endo/Heme/Allergies: Negative.   Psychiatric/Behavioral: Negative.      Physical Exam:  There were no vitals taken for this visit.  Physical Exam  Constitutional: She is well-developed, well-nourished, and in no distress. No distress.  Moves about easily without any difficulty related to her left groin pain  Eyes: Pupils are equal, round, and reactive to light. Right eye exhibits no discharge. Left eye exhibits no discharge. No scleral icterus.  Neck: No JVD present.  Abdominal: Soft. She exhibits no distension. There is no tenderness. There is no rebound and no guarding.  Genitourinary:  Genitourinary Comments: Scar in left groin without mass.  Patient is examined standing and with Valsalva.  No sign of recurrent hernia no bulge and essentially no tenderness  Lymphadenopathy:    She has no cervical adenopathy.  Skin: Skin is warm and dry. No rash noted. She is not diaphoretic. No erythema.  Vitals reviewed.     No results found for this or any previous visit (from the past 48 hour(s)). No results  found.  Assessment/Plan:  No sign of recurrent left inguinal hernia although it would be expected at some point because no mesh was utilized during a McVay type repair for incarcerated bowel.  She has a back surgery scheduled and at this time I see no reason to change that plan.  She can follow-up on an as-needed basis  Florene Glen, MD, FACS

## 2017-06-08 NOTE — Patient Instructions (Signed)
Please call your primary care doctor and schedule an appointment for your bladder issues.  Please call our office if you have questions or concerns.

## 2017-06-16 DIAGNOSIS — M533 Sacrococcygeal disorders, not elsewhere classified: Secondary | ICD-10-CM | POA: Insufficient documentation

## 2017-06-16 DIAGNOSIS — M51369 Other intervertebral disc degeneration, lumbar region without mention of lumbar back pain or lower extremity pain: Secondary | ICD-10-CM | POA: Insufficient documentation

## 2017-06-16 DIAGNOSIS — M5136 Other intervertebral disc degeneration, lumbar region: Secondary | ICD-10-CM | POA: Insufficient documentation

## 2017-08-04 ENCOUNTER — Other Ambulatory Visit: Payer: Self-pay | Admitting: Family Medicine

## 2017-08-04 DIAGNOSIS — Z1231 Encounter for screening mammogram for malignant neoplasm of breast: Secondary | ICD-10-CM

## 2017-09-14 ENCOUNTER — Emergency Department
Admission: EM | Admit: 2017-09-14 | Discharge: 2017-09-14 | Disposition: A | Discharge status: Home / Self Care | Payer: Medicare Other | Attending: Emergency Medicine | Admitting: Emergency Medicine

## 2017-09-14 ENCOUNTER — Emergency Department: Payer: Medicare Other

## 2017-09-14 ENCOUNTER — Other Ambulatory Visit: Payer: Self-pay

## 2017-09-14 ENCOUNTER — Encounter: Payer: Self-pay | Admitting: Emergency Medicine

## 2017-09-14 DIAGNOSIS — R51 Headache: Secondary | ICD-10-CM

## 2017-09-14 DIAGNOSIS — Z79899 Other long term (current) drug therapy: Secondary | ICD-10-CM | POA: Diagnosis not present

## 2017-09-14 DIAGNOSIS — Z7982 Long term (current) use of aspirin: Secondary | ICD-10-CM | POA: Diagnosis not present

## 2017-09-14 DIAGNOSIS — I10 Essential (primary) hypertension: Secondary | ICD-10-CM | POA: Diagnosis not present

## 2017-09-14 DIAGNOSIS — J329 Chronic sinusitis, unspecified: Secondary | ICD-10-CM | POA: Insufficient documentation

## 2017-09-14 DIAGNOSIS — R519 Headache, unspecified: Secondary | ICD-10-CM

## 2017-09-14 MED ORDER — PREDNISONE 20 MG PO TABS
40.0000 mg | ORAL_TABLET | Freq: Once | ORAL | Status: AC
  Administered 2017-09-14: 40 mg via ORAL
  Filled 2017-09-14: qty 2

## 2017-09-14 MED ORDER — METHYLPREDNISOLONE 4 MG PO TABS: ORAL_TABLET | ORAL | 0 refills | Status: DC

## 2017-09-14 NOTE — ED Triage Notes (Signed)
Pt reports that she is has had a sinus infection for the last few weeks, she reports that she went to her PMD and they gave her shots and pills and it still isnt any better. She has taken tylenol and reports that it is not helping.

## 2017-09-14 NOTE — ED Provider Notes (Signed)
[facility] Emergency Department Provider Note  ___________________________________________   First MD Initiated Contact with Patient 09/14/17 2106     (approximate)  I have reviewed the triage vital signs and the nursing notes.   HISTORY  Chief Complaint Headache and Recurrent Sinusitis   HPI Barbara Vega is a 84 y.o. female with a history of chronic sinusitis who is presenting to the emergency department today with persistent drainage to the back of her throat as well as frontal sinus pain as well as pain to the back of her head.  She says that the pain is moderate at this time but can get up to a "9 out of 10."  She states that the pain is not worsened with movement.  Denies any blurred vision worse than her normal macular degeneration.  Does not report any fever.  Has been on azithromycin and Levaquin.  Also received a 40 mg Solu-Medrol shot on 24 April.  Has also been taking Tessalon Perles and was prescribed an albuterol inhaler.  She says that none of these medications have helped with her symptoms and the headache has been persistent.  She says that she has had sinusitis for her entire life and has presented with similar symptoms in the past.  She says that she has seen an ENT previously but not recently.  She said that she went to her primary care doctor for her most recent round of treatments.  States that she also has bilateral ear pressure but is not having any nasal drainage at this time.  Past Medical History:  Diagnosis Date  . Adenomatous colon polyp   . Anemia    hx  . Arthritis   . Benign breast lumps   . History of colon polyps    benign  . Hypertension    takes Triamterene-HCTZ daily  . Insomnia    takes Restoril nightly  . Macular degeneration    dry  . Osteoporosis   . Sinus infection    taking Ceftin daily     Patient Active Problem List   Diagnosis Date Noted  . Incarcerated left inguinal hernia   . Small bowel obstruction  ([place])   . Hyponatremia 11/20/2016  . GERD (gastroesophageal reflux disease) 09/20/2016  . Dysphagia, unspecified 08/09/2016  . Papilloma of breast 04/03/2015  . Osteoporosis, post-menopausal 03/04/2015  . Cannot sleep 03/04/2015  . BP (high blood pressure) 03/04/2015  . Adenomatous colon polyp 03/04/2015    Past Surgical History:  Procedure Laterality Date  . ABDOMINAL HYSTERECTOMY    . BREAST LUMPECTOMY Right 09/08/2015   intraductal papilloma  . BREAST LUMPECTOMY Left    2 bx done only one scar seen years ago neg  . cataract surgery Bilateral   . COLONOSCOPY    . COLONOSCOPY WITH PROPOFOL N/A 11/03/2016   Procedure: COLONOSCOPY WITH PROPOFOL;  Surgeon: Manya Silvas, MD;  Location: [facility] ENDOSCOPY;  Service: Endoscopy;  Laterality: N/A;  . ESOPHAGOGASTRODUODENOSCOPY (EGD) WITH PROPOFOL N/A 11/03/2016   Procedure: ESOPHAGOGASTRODUODENOSCOPY (EGD) WITH PROPOFOL;  Surgeon: Manya Silvas, MD;  Location: [facility] ENDOSCOPY;  Service: Endoscopy;  Laterality: N/A;  . INGUINAL HERNIA REPAIR Left 01/15/2017   Procedure: HERNIA REPAIR INGUINAL ADULT;  Surgeon: Florene Glen, MD;  Location: [facility] ORS;  Service: General;  Laterality: Left;  . RADIOACTIVE SEED GUIDED EXCISIONAL BREAST BIOPSY Right 09/08/2015   Procedure: RADIOACTIVE SEED GUIDED EXCISIONAL BREAST BIOPSY;  Surgeon: Rolm Bookbinder, MD;  Location: [place];  Service: General;  Laterality: Right;  Prior to Admission medications   Medication Sig Start Date End Date Taking? Authorizing Provider  aspirin EC 81 MG tablet Take 81 mg by mouth daily.    [provider]  calcium carbonate (OS-CAL - DOSED IN MG OF ELEMENTAL CALCIUM) 1250 (500 CA) MG tablet Take 1 tablet by mouth daily with breakfast.     [provider]  cetirizine (ZYRTEC) 10 MG tablet Take 10 mg by mouth daily as needed.     [provider]  cholecalciferol (VITAMIN D) 1000 UNITS tablet Take 1,000 Units by mouth daily.    [provider]  esomeprazole (NEXIUM) 20 MG capsule Take 20 mg by mouth daily as needed.     [provider]  KRILL OIL PO Take 1 tablet by mouth daily.     [provider]  lisinopril (PRINIVIL,ZESTRIL) 5 MG tablet Take 1 tablet (5 mg total) by mouth daily. 02/07/17   Henreitta Leber, MD  temazepam (RESTORIL) 30 MG capsule Take 30 mg by mouth at bedtime.  09/13/14   [provider]    Allergies Codone [hydrocodone]  Family History  Problem Relation Age of Onset  . Osteoporosis Mother   . Heart failure Mother   . Heart block Brother   . Alcohol abuse Brother   . Bladder Cancer Neg Hx   . Kidney cancer Neg Hx     Social History Social History   Tobacco Use  . Smoking status: Never Smoker  . Smokeless tobacco: Never Used  Substance Use Topics  . Alcohol use: No    Alcohol/week: 0.0 oz  . Drug use: No    Review of Systems  Constitutional: No fever/chills Eyes: No visual changes. ENT: As above Cardiovascular: Denies chest pain. Respiratory: Denies shortness of breath. Gastrointestinal: No abdominal pain.  No nausea, no vomiting.  No diarrhea.  No constipation. Genitourinary: Negative for dysuria. Musculoskeletal: Negative for back pain. Skin: Negative for rash. Neurological: Negative for focal weakness or numbness.   ____________________________________________   PHYSICAL EXAM:  VITAL SIGNS: ED Triage Vitals  Enc Vitals Group     BP 09/14/17 1843 (!) 165/77     Pulse Rate 09/14/17 1843 71     Resp 09/14/17 1843 20     Temp 09/14/17 1843 98.3 F (36.8 C)     Temp Source 09/14/17 1843 Oral     SpO2 09/14/17 1843 98 %     Weight 09/14/17 1844 143 lb (64.9 kg)     Height 09/14/17 1844 5\' 2"  (1.575 m)     Head Circumference --      Peak Flow --      Pain Score 09/14/17 1844 9     Pain Loc --      Pain Edu? --      Excl. in [place]? --     Constitutional: Alert and oriented. Well appearing and in no acute distress. Eyes: Conjunctivae  are normal.  Head: Atraumatic.  Normal TMs bilaterally.  No tenderness to palpation or nodularity along the distribution of the bilateral temporal arteries. Nose: No congestion/rhinnorhea. Mouth/Throat: Mucous membranes are moist.  No pharyngeal erythema.  No tonsillar swelling or exudates.  Small amount of clear drainage seen in the posterior pharynx. Neck: No stridor.   Cardiovascular: Normal rate, regular rhythm. Grossly normal heart sounds.   Respiratory: Normal respiratory effort.  No retractions. Lungs CTAB. Gastrointestinal: Soft and nontender. No distention.  Musculoskeletal: No lower extremity tenderness nor edema.  No joint effusions. Neurologic:  Normal speech and language. No gross focal neurologic deficits are appreciated. Skin:  Skin is warm, dry and intact. No rash noted. Psychiatric: Mood and affect are normal. Speech and behavior are normal.  ____________________________________________   LABS (all labs ordered are listed, but only abnormal results are displayed)  Labs Reviewed - No data to display ____________________________________________  EKG   ____________________________________________  RADIOLOGY  No acute finding on the CAT scan of the brain. ____________________________________________   PROCEDURES  Procedure(s) performed:   Procedures  Critical Care performed:   ____________________________________________   INITIAL IMPRESSION / ASSESSMENT AND PLAN / ED COURSE  Pertinent labs & imaging results that were available during my care of the patient were reviewed by me and considered in my medical decision making (see chart for details).  Differential diagnosis includes, but is not limited to, intracranial hemorrhage, meningitis/encephalitis, previous head trauma, cavernous venous thrombosis, tension headache, temporal arteritis, migraine or migraine equivalent, idiopathic intracranial hypertension, and non-specific headache. As part of my medical  decision making, I reviewed the following data within the [facility] recent outpatient records.  Patient at this time appears well.  No visual change.  No tenderness along the distribution of the temporal arteries.  We will give a course of p.o. steroids.  Patient understanding the plan as well as diagnosis and willing to comply.  We will also give referral to ENT as the patient has had refractory symptoms.  Normal CAT scan without evidence of paranasal sinus disease.  However patient is symptomatic.  I do not see evidence of secondary cephalgia from a bleed or tumor. ____________________________________________   FINAL CLINICAL IMPRESSION(S) / ED DIAGNOSES  Headache.  Chronic sinusitis    NEW MEDICATIONS STARTED DURING THIS VISIT:  New Prescriptions   No medications on file     Note:  This document was prepared using Dragon voice recognition software and may include unintentional dictation errors.     Orbie Pyo, MD 09/14/17 [phone]

## 2017-09-14 NOTE — ED Notes (Signed)
Patient states her MD placed her on levofloxacin for a bacterial sinus infection, but the patient has seen no improvement since being on the medication.  She has had an increase in drainage and headaches.  Patient in NAD a this time and is neurologically intact.

## 2017-09-30 ENCOUNTER — Ambulatory Visit
Admission: RE | Admit: 2017-09-30 | Discharge: 2017-09-30 | Disposition: A | Discharge status: Home / Self Care | Payer: Medicare Other | Source: Ambulatory Visit | Attending: Family Medicine | Admitting: Family Medicine

## 2017-09-30 DIAGNOSIS — Z1231 Encounter for screening mammogram for malignant neoplasm of breast: Secondary | ICD-10-CM

## 2017-11-08 ENCOUNTER — Other Ambulatory Visit: Payer: Self-pay | Admitting: Physician Assistant

## 2017-11-08 DIAGNOSIS — H9202 Otalgia, left ear: Secondary | ICD-10-CM

## 2017-11-15 ENCOUNTER — Ambulatory Visit
Admission: RE | Admit: 2017-11-15 | Discharge: 2017-11-15 | Disposition: A | Discharge status: Home / Self Care | Payer: Medicare Other | Source: Ambulatory Visit | Attending: Physician Assistant | Admitting: Physician Assistant

## 2017-11-15 DIAGNOSIS — H9202 Otalgia, left ear: Secondary | ICD-10-CM | POA: Diagnosis present

## 2017-11-15 MED ORDER — IOPAMIDOL (ISOVUE-300) INJECTION 61%
75.0000 mL | Freq: Once | INTRAVENOUS | Status: AC | PRN
  Administered 2017-11-15: 75 mL via INTRAVENOUS

## 2017-11-22 ENCOUNTER — Other Ambulatory Visit: Payer: Self-pay | Admitting: Physician Assistant

## 2017-11-22 DIAGNOSIS — E041 Nontoxic single thyroid nodule: Secondary | ICD-10-CM

## 2017-11-29 ENCOUNTER — Ambulatory Visit
Admission: RE | Admit: 2017-11-29 | Discharge: 2017-11-29 | Disposition: A | Discharge status: Home / Self Care | Payer: Medicare Other | Source: Ambulatory Visit | Attending: Physician Assistant | Admitting: Physician Assistant

## 2017-11-29 DIAGNOSIS — E041 Nontoxic single thyroid nodule: Secondary | ICD-10-CM | POA: Diagnosis present

## 2017-11-29 DIAGNOSIS — E042 Nontoxic multinodular goiter: Secondary | ICD-10-CM | POA: Diagnosis not present

## 2017-11-30 ENCOUNTER — Other Ambulatory Visit: Payer: Self-pay | Admitting: Physician Assistant

## 2017-11-30 DIAGNOSIS — E041 Nontoxic single thyroid nodule: Secondary | ICD-10-CM

## 2017-12-04 ENCOUNTER — Other Ambulatory Visit: Payer: Self-pay

## 2017-12-04 ENCOUNTER — Emergency Department: Payer: Medicare Other

## 2017-12-04 ENCOUNTER — Emergency Department
Admission: EM | Admit: 2017-12-04 | Discharge: 2017-12-04 | Disposition: A | Discharge status: Home / Self Care | Payer: Medicare Other | Attending: Emergency Medicine | Admitting: Emergency Medicine

## 2017-12-04 DIAGNOSIS — R0602 Shortness of breath: Secondary | ICD-10-CM

## 2017-12-04 DIAGNOSIS — Z7982 Long term (current) use of aspirin: Secondary | ICD-10-CM | POA: Insufficient documentation

## 2017-12-04 DIAGNOSIS — Z79899 Other long term (current) drug therapy: Secondary | ICD-10-CM | POA: Insufficient documentation

## 2017-12-04 DIAGNOSIS — I10 Essential (primary) hypertension: Secondary | ICD-10-CM | POA: Insufficient documentation

## 2017-12-04 MED ORDER — LORAZEPAM 1 MG PO TABS
1.0000 mg | ORAL_TABLET | Freq: Three times a day (TID) | ORAL | 0 refills | Status: AC | PRN
Start: 2017-12-04 — End: 2018-12-04

## 2017-12-04 MED ORDER — LORAZEPAM 1 MG PO TABS
1.0000 mg | ORAL_TABLET | Freq: Once | ORAL | Status: AC
Start: 2017-12-04 — End: 2017-12-04
  Administered 2017-12-04: 1 mg via ORAL
  Filled 2017-12-04: qty 1

## 2017-12-04 NOTE — ED Provider Notes (Signed)
[facility] Emergency Department Provider Note  ____________________________________________   First MD Initiated Contact with Patient 12/04/17 [phone]     (approximate)  I have reviewed the triage vital signs and the nursing notes.   HISTORY  Chief Complaint Shortness of Breath   HPI Barbara Vega is a 84 y.o. female who self presents to the emergency department with 4 to 5 months constant shortness of breath.  Been worked up extensively by multiple physicians within multiple specialties.  Currently being evaluated by otolaryngology and has had a scope which was unremarkable.  She did recently have an ultrasound of her thyroid showing 2 nodules she is scheduled for a fine-needle aspiration the days.  This morning she felt persistent shortness of breath so she came to the emergency department for another opinion.  She is never had a DVT or pulmonary embolism.  No recent surgery travel or immobilization.  No leg swelling.  Outside and worsened when she is at home.  Her symptoms are moderate severity.    Past Medical History:  Diagnosis Date  . Adenomatous colon polyp   . Anemia    hx  . Arthritis   . Benign breast lumps   . History of colon polyps    benign  . Hypertension    takes Triamterene-HCTZ daily  . Insomnia    takes Restoril nightly  . Macular degeneration    dry  . Osteoporosis   . Sinus infection    taking Ceftin daily     Patient Active Problem List   Diagnosis Date Noted  . Incarcerated left inguinal hernia   . Small bowel obstruction ([place])   . Hyponatremia 11/20/2016  . GERD (gastroesophageal reflux disease) 09/20/2016  . Dysphagia, unspecified 08/09/2016  . Papilloma of breast 04/03/2015  . Osteoporosis, post-menopausal 03/04/2015  . Cannot sleep 03/04/2015  . BP (high blood pressure) 03/04/2015  . Adenomatous colon polyp 03/04/2015    Past Surgical History:  Procedure Laterality Date  . ABDOMINAL HYSTERECTOMY    . BREAST  LUMPECTOMY Right 09/08/2015   intraductal papilloma  . BREAST LUMPECTOMY Left    2 bx done only one scar seen years ago neg  . cataract surgery Bilateral   . COLONOSCOPY    . COLONOSCOPY WITH PROPOFOL N/A 11/03/2016   Procedure: COLONOSCOPY WITH PROPOFOL;  Surgeon: Manya Silvas, MD;  Location: [facility] ENDOSCOPY;  Service: Endoscopy;  Laterality: N/A;  . ESOPHAGOGASTRODUODENOSCOPY (EGD) WITH PROPOFOL N/A 11/03/2016   Procedure: ESOPHAGOGASTRODUODENOSCOPY (EGD) WITH PROPOFOL;  Surgeon: Manya Silvas, MD;  Location: [facility] ENDOSCOPY;  Service: Endoscopy;  Laterality: N/A;  . INGUINAL HERNIA REPAIR Left 01/15/2017   Procedure: HERNIA REPAIR INGUINAL ADULT;  Surgeon: Florene Glen, MD;  Location: [facility] ORS;  Service: General;  Laterality: Left;  . RADIOACTIVE SEED GUIDED EXCISIONAL BREAST BIOPSY Right 09/08/2015   Procedure: RADIOACTIVE SEED GUIDED EXCISIONAL BREAST BIOPSY;  Surgeon: Rolm Bookbinder, MD;  Location: [place];  Service: General;  Laterality: Right;    Prior to Admission medications   Medication Sig Start Date End Date Taking? Authorizing Provider  aspirin EC 81 MG tablet Take 81 mg by mouth daily.    [provider]  calcium carbonate (OS-CAL - DOSED IN MG OF ELEMENTAL CALCIUM) 1250 (500 CA) MG tablet Take 1 tablet by mouth daily with breakfast.     [provider]  cetirizine (ZYRTEC) 10 MG tablet Take 10 mg by mouth daily as needed.     [provider]  cholecalciferol (  VITAMIN D) 1000 UNITS tablet Take 1,000 Units by mouth daily.    [provider]  esomeprazole (NEXIUM) 20 MG capsule Take 20 mg by mouth daily as needed.     [provider]  KRILL OIL PO Take 1 tablet by mouth daily.     [provider]  lisinopril (PRINIVIL,ZESTRIL) 5 MG tablet Take 1 tablet (5 mg total) by mouth daily. 02/07/17   Henreitta Leber, MD  LORazepam (ATIVAN) 1 MG tablet Take 1 tablet (1 mg total) by mouth 3 (three) times daily as needed for  anxiety. 12/04/17 12/04/18  Darel Hong, MD  methylPREDNISolone (MEDROL) 4 MG tablet Disp 4mg  dosepak.  Follow directions on packaging. 09/14/17   Schaevitz, Randall An, MD  temazepam (RESTORIL) 30 MG capsule Take 30 mg by mouth at bedtime.  09/13/14   [provider]    Allergies Codone [hydrocodone]  Family History  Problem Relation Age of Onset  . Osteoporosis Mother   . Heart failure Mother   . Heart block Brother   . Alcohol abuse Brother   . Bladder Cancer Neg Hx   . Kidney cancer Neg Hx     Social History Social History   Tobacco Use  . Smoking status: Never Smoker  . Smokeless tobacco: Never Used  Substance Use Topics  . Alcohol use: No    Alcohol/week: 0.0 oz  . Drug use: No    Review of Systems Constitutional: No fever/chills Eyes: No visual changes. ENT: No sore throat. Cardiovascular: Denies chest pain. Respiratory: Positive for shortness of breath. Gastrointestinal: No abdominal pain.  No nausea, no vomiting.  No diarrhea.  No constipation. Genitourinary: Negative for dysuria. Musculoskeletal: Negative for back pain. Skin: Negative for rash. Neurological: Negative for headaches, focal weakness or numbness.   ____________________________________________   PHYSICAL EXAM:  VITAL SIGNS: ED Triage Vitals  Enc Vitals Group     BP 12/04/17 0544 (!) 172/64     Pulse Rate 12/04/17 0544 72     Resp 12/04/17 0544 18     Temp 12/04/17 0544 98.2 F (36.8 C)     Temp Source 12/04/17 0544 Oral     SpO2 12/04/17 0544 97 %     Weight 12/04/17 0545 143 lb (64.9 kg)     Height 12/04/17 0545 5\' 2"  (1.575 m)     Head Circumference --      Peak Flow --      Pain Score 12/04/17 0542 0     Pain Loc --      Pain Edu? --      Excl. in [place]? --     Constitutional: Alert and oriented x4 extremely anxious appearing wringing her hands and quite nervous Eyes: PERRL EOMI. Head: Atraumatic. Nose: No congestion/rhinnorhea. Mouth/Throat: No trismus uvula  midline no pharyngeal erythema or exudate Neck: No stridor.  Thyroid is palpable Cardiovascular: Normal rate, regular rhythm. Grossly normal heart sounds.  Good peripheral circulation. Respiratory: Normal respiratory effort.  No retractions. Lungs CTAB and moving good air Gastrointestinal: Soft nontender Musculoskeletal: No lower extremity edema   Neurologic:  Normal speech and language. No gross focal neurologic deficits are appreciated. Skin:  Skin is warm, dry and intact. No rash noted. Psychiatric: Anxious appearing    ____________________________________________   DIFFERENTIAL includes but not limited to  Ludwig's angina, pneumonia, reactive airway disease, bacterial tracheitis, pulmonary embolism ____________________________________________   LABS (all labs ordered are listed, but only abnormal results are displayed)  Labs Reviewed - No data to  display   __________________________________________  EKG   ____________________________________________  RADIOLOGY  Chest x-ray reviewed by me with no acute ____________________________________________   PROCEDURES  Procedure(s) performed: no  Procedures  Critical Care performed: no  ____________________________________________   INITIAL IMPRESSION / ASSESSMENT AND PLAN / ED COURSE  Pertinent labs & imaging results that were available during my care of the patient were reviewed by me and considered in my medical decision making (see chart for details).   Patient arrives quite anxious appearing 4 months.  She is PERC negative aside from age.  She appears quite anxious so I gave her 1mg  of oral lorazepam with improvement in her symptoms.  I had a lengthy discussion with the patient and her son at bedside and have encouraged him to continue follow-up with otolaryngology this week as scheduled.  Strict return precautions have been given and she is discharged home in improved condition.       ____________________________________________   FINAL CLINICAL IMPRESSION(S) / ED DIAGNOSES  Final diagnoses:  SOB (shortness of breath)      NEW MEDICATIONS STARTED DURING THIS VISIT:  Discharge Medication List as of 12/04/2017  6:49 AM    START taking these medications   Details  LORazepam (ATIVAN) 1 MG tablet Take 1 tablet (1 mg total) by mouth 3 (three) times daily as needed for anxiety., Starting Sun 12/04/2017, Until Mon 12/04/2018, Print         Note:  This document was prepared using Dragon voice recognition software and may include unintentional dictation errors.     Darel Hong, MD 12/04/17 [phone]

## 2017-12-04 NOTE — Discharge Instructions (Signed)
Please keep your follow-up appointment with your otolaryngologist as scheduled and return to the emergency department for any concerns.  It was a pleasure to take care of you today, and thank you for coming to our emergency department.  If you have any questions or concerns before leaving please ask the nurse to grab me and I'm more than happy to go through your aftercare instructions again.  If you were prescribed any opioid pain medication today such as Norco, Vicodin, Percocet, morphine, hydrocodone, or oxycodone please make sure you do not drive when you are taking this medication as it can alter your ability to drive safely.  If you have any concerns once you are home that you are not improving or are in fact getting worse before you can make it to your follow-up appointment, please do not hesitate to call 911 and come back for further evaluation.  Darel Hong, MD  Results for orders placed or performed during the hospital encounter of 02/05/17  Lipase, blood  Result Value Ref Range   Lipase 28 11 - 51 U/L  Comprehensive metabolic panel  Result Value Ref Range   Sodium 122 (L) 135 - 145 mmol/L   Potassium 3.1 (L) 3.5 - 5.1 mmol/L   Chloride 86 (L) 101 - 111 mmol/L   CO2 26 22 - 32 mmol/L   Glucose, Bld 123 (H) 65 - 99 mg/dL   BUN 10 6 - 20 mg/dL   Creatinine, Ser 0.57 0.44 - 1.00 mg/dL   Calcium 9.7 8.9 - 10.3 mg/dL   Total Protein 7.0 6.5 - 8.1 g/dL   Albumin 4.4 3.5 - 5.0 g/dL   AST 22 15 - 41 U/L   ALT 8 (L) 14 - 54 U/L   Alkaline Phosphatase 58 38 - 126 U/L   Total Bilirubin 1.2 0.3 - 1.2 mg/dL   GFR calc non Af Amer >60 >60 mL/min   GFR calc Af Amer >60 >60 mL/min   Anion gap 10 5 - 15  CBC  Result Value Ref Range   WBC 6.6 3.6 - 11.0 K/uL   RBC 4.40 3.80 - 5.20 MIL/uL   Hemoglobin 13.6 12.0 - 16.0 g/dL   HCT 38.0 35.0 - 47.0 %   MCV 86.3 80.0 - 100.0 fL   MCH 30.9 26.0 - 34.0 pg   MCHC 35.9 32.0 - 36.0 g/dL   RDW 13.2 11.5 - 14.5 %   Platelets 523 (H) 150 -  440 K/uL  Urinalysis, Complete w Microscopic  Result Value Ref Range   Color, Urine STRAW (A) YELLOW   APPearance CLEAR (A) CLEAR   Specific Gravity, Urine 1.006 1.005 - 1.030   pH 7.0 5.0 - 8.0   Glucose, UA NEGATIVE NEGATIVE mg/dL   Hgb urine dipstick SMALL (A) NEGATIVE   Bilirubin Urine NEGATIVE NEGATIVE   Ketones, ur 5 (A) NEGATIVE mg/dL   Protein, ur NEGATIVE NEGATIVE mg/dL   Nitrite NEGATIVE NEGATIVE   Leukocytes, UA NEGATIVE NEGATIVE   RBC / HPF 0-5 0 - 5 RBC/hpf   WBC, UA 0-5 0 - 5 WBC/hpf   Bacteria, UA NONE SEEN NONE SEEN   Squamous Epithelial / LPF NONE SEEN NONE SEEN  Magnesium  Result Value Ref Range   Magnesium 1.7 1.7 - 2.4 mg/dL  Basic metabolic panel  Result Value Ref Range   Sodium 132 (L) 135 - 145 mmol/L   Potassium 4.3 3.5 - 5.1 mmol/L   Chloride 103 101 - 111 mmol/L   CO2 24  22 - 32 mmol/L   Glucose, Bld 89 65 - 99 mg/dL   BUN 5 (L) 6 - 20 mg/dL   Creatinine, Ser 0.44 0.44 - 1.00 mg/dL   Calcium 8.6 (L) 8.9 - 10.3 mg/dL   GFR calc non Af Amer >60 >60 mL/min   GFR calc Af Amer >60 >60 mL/min   Anion gap 5 5 - 15  CBC  Result Value Ref Range   WBC 5.7 3.6 - 11.0 K/uL   RBC 3.88 3.80 - 5.20 MIL/uL   Hemoglobin 12.1 12.0 - 16.0 g/dL   HCT 34.2 (L) 35.0 - 47.0 %   MCV 88.0 80.0 - 100.0 fL   MCH 31.2 26.0 - 34.0 pg   MCHC 35.5 32.0 - 36.0 g/dL   RDW 13.5 11.5 - 14.5 %   Platelets 459 (H) 150 - 440 K/uL   Ct Soft Tissue Neck W Contrast  Result Date: 11/15/2017 CLINICAL DATA:  84 year old female with left ear pain, hoarseness, feeling of sinus drainage. EXAM: CT NECK WITH CONTRAST TECHNIQUE: Multidetector CT imaging of the neck was performed using the standard protocol following the bolus administration of intravenous contrast. CONTRAST:  8mL ISOVUE-300 IOPAMIDOL (ISOVUE-300) INJECTION 61% COMPARISON:  Head CT without contrast 09/14/2017, brain MRI 06/18/2013. FINDINGS: Pharynx and larynx: The larynx is symmetric and within normal limits. Mild motion  artifact at the hypopharynx and oropharynx. Pharyngeal soft tissue contours are within normal limits. Negative parapharyngeal spaces. Negative retropharyngeal space. Salivary glands: Negative sublingual space. Submandibular and parotid glands are symmetric and within normal limits. Thyroid: Mild enlargement of the right thyroid lobe with a 20 millimeter hyperdense nodule seen on coronal image 58. Additional sub centimeter hypodense left lobe nodules. No regional mass effect. Lymph nodes: Negative.  No cervical lymphadenopathy. Vascular: The major vascular structures in the neck and at the skull base are patent. Mild for age cervical carotid atherosclerosis. Limited intracranial: Negative. Visualized orbits: Stable and negative. Mastoids and visualized paranasal sinuses: Stable and clear. The bilateral tympanic cavities are clear. Skeleton: Largely absent dentition. Mild for age cervical spine degeneration. No acute osseous abnormality identified. Upper chest: No superior mediastinal lymphadenopathy. Mild Calcified aortic atherosclerosis. Tortuous proximal great vessels. Negative visible lung parenchyma aside from mild dependent atelectasis. Normal visible axillary lymph nodes. IMPRESSION: 1. Essentially normal for age CT appearance of the neck. No acute or inflammatory process identified, with clear visible paranasal sinuses, middle ears, and mastoids. 2. Benign multinodular goiter suspected. Electronically Signed   By: Genevie Ann M.D.   On: 11/15/2017 15:12   US Thyroid  Result Date: 11/29/2017 CLINICAL DATA:  Incidental on CT. 84 year old female with a thyroid nodule seen on prior CT EXAM: THYROID ULTRASOUND TECHNIQUE: Ultrasound examination of the thyroid gland and adjacent soft tissues was performed. COMPARISON:  CT scan of the neck 11/15/2017 FINDINGS: Parenchymal Echotexture: Mildly heterogenous Isthmus: 0.3 cm Right lobe: 4.0 x 1.9 x 1.9 cm Left lobe: 3.9 x 0.6 x 1.0 cm  _________________________________________________________ Estimated total number of nodules >/= 1 cm: 1 Number of spongiform nodules >/=  2 cm not described below (TR1): 0 Number of mixed cystic and solid nodules >/= 1.5 cm not described below (TR2): 0 _________________________________________________________ Nodule # 1: Location: Right; Mid Maximum size: 1.9 cm; Other 2 dimensions: 1.7 x 1.3 cm Composition: solid/almost completely solid (2) Echogenicity: hypoechoic (2) Shape: not taller-than-wide (0) Margins: smooth (0) Echogenic foci: none (0) ACR TI-RADS total points: 4. ACR TI-RADS risk category: TR4 (4-6 points). ACR TI-RADS recommendations: **Given size (>/=  1.5 cm) and appearance, fine needle aspiration of this moderately suspicious nodule should be considered based on TI-RADS criteria. _________________________________________________________ Additional small bilateral lower pole thyroid nodules measure less than 1 cm in size and do not meet criteria for biopsy or further evaluation. IMPRESSION: The 1.9 cm TI-RADS category 4 nodule in the right mid gland meets criteria for consideration of fine-needle aspiration biopsy. The above is in keeping with the ACR TI-RADS recommendations - J Am Coll Radiol 2017;14:587-595. Electronically Signed   By: Jacqulynn Cadet M.D.   On: 11/29/2017 16:06

## 2017-12-04 NOTE — ED Notes (Signed)
NAD noted at time of D/C. Pt denies questions or concerns. Pt ambulatory to the lobby at this time with her son. Pt refused wheelchair to the lobby at time of D/C.

## 2017-12-04 NOTE — ED Notes (Signed)
Pt reports she has nodules on her throat , and feels they are swelling , due for biopsy Thursday . Denies pain , reports trouble breathing , pt sat >95-96% and equal nonlabored Pt appears to be in no acute distress .

## 2017-12-04 NOTE — ED Triage Notes (Addendum)
Patient reports being short of breath "for awhile", states it was worse tonight.  Patient is speaking in complete sentences without difficulty.  Patient is able to control own secretions without difficulty.

## 2017-12-08 ENCOUNTER — Ambulatory Visit
Admission: RE | Admit: 2017-12-08 | Discharge: 2017-12-08 | Disposition: A | Discharge status: Home / Self Care | Payer: Medicare Other | Source: Ambulatory Visit | Attending: Physician Assistant | Admitting: Physician Assistant

## 2017-12-08 ENCOUNTER — Ambulatory Visit: Admission: RE | Admit: 2017-12-08 | Payer: Medicare Other | Source: Ambulatory Visit

## 2017-12-08 DIAGNOSIS — E041 Nontoxic single thyroid nodule: Secondary | ICD-10-CM | POA: Diagnosis present

## 2017-12-08 NOTE — Discharge Instructions (Signed)
Thyroid Biopsy, Care After °Refer to this sheet in the next few weeks. These instructions provide you with information on caring for yourself after your procedure. Your health care provider may also give you more specific instructions. Your treatment has been planned according to current medical practices, but problems sometimes occur. Call your health care provider if you have any problems or questions after your procedure. °What can I expect after the procedure? °After your procedure, it is typical to have the following: °· You may have soreness and tenderness at the biopsy site for a few days. °· You may have a sore throat or a hoarse voice if you had an open biopsy. This should go away after a couple days. ° °Follow these instructions at home: °· Take medicines only as directed by your health care provider. °· To ease discomfort at the biopsy site: °? Keep your head raised on a pillow when you are lying down. °? Support the back of your head and neck with both hands as you sit up from a lying position. °· If you have a sore throat, try using throat lozenges or gargling with warm salt water. °· Keep all follow-up visits as directed by your health care provider. This is important. °Contact a health care provider if: °· You have a fever. °Get help right away if: °· You have severe bleeding from the biopsy site. °· You have difficulty swallowing. °· You have drainage, redness, swelling, or pain at the biopsy site. °· You have swollen glands (lymph nodes) in your neck. °This information is not intended to replace advice given to you by your health care provider. Make sure you discuss any questions you have with your health care provider. °Document Released: 11/28/2013 Document Revised: 01/04/2016 Document Reviewed: 07/26/2013 °Elsevier Interactive Patient Education © 2018 [employer] ° °

## 2017-12-12 LAB — CYTOLOGY - NON PAP

## 2018-03-21 ENCOUNTER — Other Ambulatory Visit: Payer: Self-pay

## 2018-03-21 ENCOUNTER — Emergency Department: Payer: Medicare Other

## 2018-03-21 ENCOUNTER — Emergency Department
Admission: EM | Admit: 2018-03-21 | Discharge: 2018-03-21 | Disposition: A | Discharge status: Home / Self Care | Payer: Medicare Other | Attending: Emergency Medicine | Admitting: Emergency Medicine

## 2018-03-21 ENCOUNTER — Encounter: Payer: Self-pay | Admitting: Emergency Medicine

## 2018-03-21 DIAGNOSIS — R1032 Left lower quadrant pain: Secondary | ICD-10-CM | POA: Diagnosis present

## 2018-03-21 DIAGNOSIS — Z79899 Other long term (current) drug therapy: Secondary | ICD-10-CM | POA: Insufficient documentation

## 2018-03-21 DIAGNOSIS — I10 Essential (primary) hypertension: Secondary | ICD-10-CM | POA: Insufficient documentation

## 2018-03-21 DIAGNOSIS — Z7982 Long term (current) use of aspirin: Secondary | ICD-10-CM | POA: Insufficient documentation

## 2018-03-21 LAB — URINALYSIS, COMPLETE (UACMP) WITH MICROSCOPIC
BACTERIA UA: NONE SEEN
Bilirubin Urine: NEGATIVE
GLUCOSE, UA: NEGATIVE mg/dL
Ketones, ur: NEGATIVE mg/dL
Leukocytes, UA: NEGATIVE
Nitrite: NEGATIVE
Protein, ur: NEGATIVE mg/dL
Specific Gravity, Urine: 1.013 (ref 1.005–1.030)
pH: 6 (ref 5.0–8.0)

## 2018-03-21 LAB — COMPREHENSIVE METABOLIC PANEL
ALT: 8 U/L (ref 0–44)
AST: 22 U/L (ref 15–41)
Albumin: 4.2 g/dL (ref 3.5–5.0)
Alkaline Phosphatase: 56 U/L (ref 38–126)
Anion gap: 7 (ref 5–15)
BILIRUBIN TOTAL: 0.9 mg/dL (ref 0.3–1.2)
BUN: 12 mg/dL (ref 8–23)
CO2: 29 mmol/L (ref 22–32)
Calcium: 9.5 mg/dL (ref 8.9–10.3)
Chloride: 103 mmol/L (ref 98–111)
Creatinine, Ser: 0.69 mg/dL (ref 0.44–1.00)
GFR calc Af Amer: >60 mL/min (ref >60)
GFR calc non Af Amer: >60 mL/min (ref >60)
GLUCOSE: 103 mg/dL — AB (ref 70–99)
POTASSIUM: 3.9 mmol/L (ref 3.5–5.1)
Sodium: 139 mmol/L (ref 135–145)
TOTAL PROTEIN: 7 g/dL (ref 6.5–8.1)

## 2018-03-21 LAB — CBC
HEMATOCRIT: 44 % (ref 36.0–46.0)
HEMOGLOBIN: 14.3 g/dL (ref 12.0–15.0)
MCH: 30.1 pg (ref 26.0–34.0)
MCHC: 32.5 g/dL (ref 30.0–36.0)
MCV: 92.6 fL (ref 80.0–100.0)
Platelets: 506 10*3/uL — ABNORMAL HIGH (ref 150–400)
RBC: 4.75 MIL/uL (ref 3.87–5.11)
RDW: 12.9 % (ref 11.5–15.5)
WBC: 5.9 10*3/uL (ref 4.0–10.5)
nRBC: 0 % (ref 0.0–0.2)

## 2018-03-21 LAB — LIPASE, BLOOD: Lipase: 31 U/L (ref 11–51)

## 2018-03-21 MED ORDER — CIPROFLOXACIN HCL 500 MG PO TABS: 500.0000 mg | ORAL_TABLET | Freq: Two times a day (BID) | ORAL | 0 refills | Status: DC

## 2018-03-21 MED ORDER — METRONIDAZOLE 500 MG PO TABS: 500.0000 mg | ORAL_TABLET | Freq: Two times a day (BID) | ORAL | 0 refills | Status: DC

## 2018-03-21 MED ORDER — IOHEXOL 300 MG/ML  SOLN
100.0000 mL | Freq: Once | INTRAMUSCULAR | Status: AC | PRN
  Administered 2018-03-21: 100 mL via INTRAVENOUS

## 2018-03-21 MED ORDER — TRAMADOL HCL 50 MG PO TABS: 50.0000 mg | ORAL_TABLET | Freq: Four times a day (QID) | ORAL | 0 refills | Status: DC | PRN

## 2018-03-21 MED ORDER — MORPHINE SULFATE (PF) 2 MG/ML IV SOLN
2.0000 mg | Freq: Once | INTRAVENOUS | Status: AC
  Administered 2018-03-21: 2 mg via INTRAVENOUS
  Filled 2018-03-21: qty 1

## 2018-03-21 MED ORDER — IOPAMIDOL (ISOVUE-300) INJECTION 61%
30.0000 mL | Freq: Once | INTRAVENOUS | Status: AC | PRN
  Administered 2018-03-21: 30 mL via ORAL

## 2018-03-21 MED ORDER — ONDANSETRON HCL 4 MG/2ML IJ SOLN
4.0000 mg | Freq: Once | INTRAMUSCULAR | Status: AC
  Administered 2018-03-21: 4 mg via INTRAVENOUS
  Filled 2018-03-21: qty 2

## 2018-03-21 NOTE — ED Provider Notes (Signed)
[facility] Emergency Department Provider Note   ____________________________________________    I have reviewed the triage vital signs and the nursing notes.   HISTORY  Chief Complaint Abdominal Pain     HPI Barbara Vega is a 84 y.o. female who presents with complaints of abdominal pain.  Patient reports left lower quadrant abdominal pain which is been intermittent over the last several months but is become quite severe and continuous in the last week.  Patient reports the pain is at the site of a prior hernia operation which was performed 1 year ago by Dr. Jackquline Bosch, apparently at that time she had a strangulated hernia causing SBO.  Denies fevers or chills, has felt nauseated, no appetite.  Reports stools are normal.   Past Medical History:  Diagnosis Date  . Adenomatous colon polyp   . Anemia    hx  . Arthritis   . Benign breast lumps   . History of colon polyps    benign  . Hypertension    takes Triamterene-HCTZ daily  . Insomnia    takes Restoril nightly  . Macular degeneration    dry  . Osteoporosis   . Sinus infection    taking Ceftin daily     Patient Active Problem List   Diagnosis Date Noted  . Incarcerated left inguinal hernia   . Small bowel obstruction ([place])   . Hyponatremia 11/20/2016  . GERD (gastroesophageal reflux disease) 09/20/2016  . Dysphagia, unspecified 08/09/2016  . Papilloma of breast 04/03/2015  . Osteoporosis, post-menopausal 03/04/2015  . Cannot sleep 03/04/2015  . BP (high blood pressure) 03/04/2015  . Adenomatous colon polyp 03/04/2015    Past Surgical History:  Procedure Laterality Date  . ABDOMINAL HYSTERECTOMY    . BREAST LUMPECTOMY Right 09/08/2015   intraductal papilloma  . BREAST LUMPECTOMY Left    2 bx done only one scar seen years ago neg  . cataract surgery Bilateral   . COLONOSCOPY    . COLONOSCOPY WITH PROPOFOL N/A 11/03/2016   Procedure: COLONOSCOPY WITH PROPOFOL;  Surgeon:  Manya Silvas, MD;  Location: [facility] ENDOSCOPY;  Service: Endoscopy;  Laterality: N/A;  . ESOPHAGOGASTRODUODENOSCOPY (EGD) WITH PROPOFOL N/A 11/03/2016   Procedure: ESOPHAGOGASTRODUODENOSCOPY (EGD) WITH PROPOFOL;  Surgeon: Manya Silvas, MD;  Location: [facility] ENDOSCOPY;  Service: Endoscopy;  Laterality: N/A;  . INGUINAL HERNIA REPAIR Left 01/15/2017   Procedure: HERNIA REPAIR INGUINAL ADULT;  Surgeon: Florene Glen, MD;  Location: [facility] ORS;  Service: General;  Laterality: Left;  . RADIOACTIVE SEED GUIDED EXCISIONAL BREAST BIOPSY Right 09/08/2015   Procedure: RADIOACTIVE SEED GUIDED EXCISIONAL BREAST BIOPSY;  Surgeon: Rolm Bookbinder, MD;  Location: [facility];  Service: General;  Laterality: Right;    Prior to Admission medications   Medication Sig Start Date End Date Taking? Authorizing Provider  aspirin EC 81 MG tablet Take 81 mg by mouth daily.   Yes [provider]  calcium carbonate (OS-CAL - DOSED IN MG OF ELEMENTAL CALCIUM) 1250 (500 CA) MG tablet Take 1 tablet by mouth daily with breakfast.    Yes [provider]  cetirizine (ZYRTEC) 10 MG tablet Take 10 mg by mouth daily as needed.    Yes [provider]  cholecalciferol (VITAMIN D) 1000 UNITS tablet Take 1,000 Units by mouth daily.   Yes [provider]  KRILL OIL PO Take 1 tablet by mouth daily.    Yes [provider]  lisinopril (PRINIVIL,ZESTRIL) 5 MG tablet Take 1 tablet (5  mg total) by mouth daily. 02/07/17  Yes Sainani, Belia Heman, MD  temazepam (RESTORIL) 30 MG capsule Take 30 mg by mouth at bedtime.  09/13/14  Yes [provider]  ciprofloxacin (CIPRO) 500 MG tablet Take 1 tablet (500 mg total) by mouth 2 (two) times daily. 03/21/18   Lavonia Drafts, MD  esomeprazole (NEXIUM) 20 MG capsule Take 20 mg by mouth daily as needed.     [provider]  LORazepam (ATIVAN) 1 MG tablet Take 1 tablet (1 mg total) by mouth 3 (three) times daily as needed for anxiety. Patient not  taking: Reported on 03/21/2018 12/04/17 12/04/18  Darel Hong, MD  methylPREDNISolone (MEDROL) 4 MG tablet Disp 4mg  dosepak.  Follow directions on packaging. Patient not taking: Reported on 03/21/2018 09/14/17   Orbie Pyo, MD  metroNIDAZOLE (FLAGYL) 500 MG tablet Take 1 tablet (500 mg total) by mouth 2 (two) times daily after a meal. 03/21/18   Lavonia Drafts, MD  traMADol (ULTRAM) 50 MG tablet Take 1 tablet (50 mg total) by mouth every 6 (six) hours as needed. 03/21/18 03/21/19  Lavonia Drafts, MD     Allergies Codone [hydrocodone]  Family History  Problem Relation Age of Onset  . Osteoporosis Mother   . Heart failure Mother   . Heart block Brother   . Alcohol abuse Brother   . Bladder Cancer Neg Hx   . Kidney cancer Neg Hx     Social History Social History   Tobacco Use  . Smoking status: Never Smoker  . Smokeless tobacco: Never Used  Substance Use Topics  . Alcohol use: No    Alcohol/week: 0.0 standard drinks  . Drug use: No    Review of Systems  Constitutional: No fever/chills Eyes: No visual changes.  ENT: No sore throat. Cardiovascular: Denies chest pain. Respiratory: Denies shortness of breath. Gastrointestinal: As above Genitourinary: Occasional dysuria Musculoskeletal: Negative for back pain. Skin: Negative for rash. Neurological: Negative for headaches   ____________________________________________   PHYSICAL EXAM:  VITAL SIGNS: ED Triage Vitals  Enc Vitals Group     BP 03/21/18 0819 (!) 149/77     Pulse Rate 03/21/18 0819 80     Resp 03/21/18 0819 18     Temp 03/21/18 0819 98.1 F (36.7 C)     Temp Source 03/21/18 0819 Oral     SpO2 03/21/18 0819 96 %     Weight 03/21/18 0820 61.7 kg (136 lb)     Height 03/21/18 0820 1.575 m (5\' 2" )     Head Circumference --      Peak Flow --      Pain Score 03/21/18 0819 8     Pain Loc --      Pain Edu? --      Excl. in [place]? --     Constitutional: Alert and oriented.  Eyes: Conjunctivae are  normal.   Nose: No congestion/rhinnorhea. Mouth/Throat: Mucous membranes are moist.    Cardiovascular: Normal rate, regular rhythm. Grossly normal heart sounds.  Good peripheral circulation. Respiratory: Normal respiratory effort.  No retractions.  Gastrointestinal: Mild tenderness palpation left lower quadrant. No distention.  No CVA tenderness.  Musculoskeletal:  Warm and well perfused Neurologic:  Normal speech and language. No gross focal neurologic deficits are appreciated.  Skin:  Skin is warm, dry and intact. No rash noted. Psychiatric: Mood and affect are normal. Speech and behavior are normal.  ____________________________________________   LABS (all labs ordered are listed, but only abnormal results are displayed)  Labs Reviewed  COMPREHENSIVE METABOLIC PANEL - Abnormal; Notable for the following components:      Result Value   Glucose, Bld 103 (*)    All other components within normal limits  CBC - Abnormal; Notable for the following components:   Platelets 506 (*)    All other components within normal limits  URINALYSIS, COMPLETE (UACMP) WITH MICROSCOPIC - Abnormal; Notable for the following components:   Color, Urine YELLOW (*)    APPearance CLEAR (*)    Hgb urine dipstick SMALL (*)    All other components within normal limits  LIPASE, BLOOD   ____________________________________________  EKG  None ____________________________________________  RADIOLOGY  CT abdomen pelvis ____________________________________________   PROCEDURES  Procedure(s) performed: No  Procedures   Critical Care performed: No ____________________________________________   INITIAL IMPRESSION / ASSESSMENT AND PLAN / ED COURSE  Pertinent labs & imaging results that were available during my care of the patient were reviewed by me and considered in my medical decision making (see chart for details).  Patient presents with complaints of continuous left lower quadrant  abdominal pain.  Will give IV morphine, IV Zofran.  Differential includes diverticulitis, unlikely to be repeat SBO is no distention, urinary tract infection.  Pending labs, anticipate CT scanning   CT scan overall unremarkable, patient had significant improvement in pain, she still has some discomfort in the left lower quadrant.  Suspicious for possible early diverticulitis given diverticulosis, will treat with antibiotics, if no improvement in the next 24 48 hrs. patient to return to the ED or follow-up with PCP    ____________________________________________   FINAL CLINICAL IMPRESSION(S) / ED DIAGNOSES  Final diagnoses:  Left lower quadrant abdominal pain        Note:  This document was prepared using Dragon voice recognition software and may include unintentional dictation errors.    Lavonia Drafts, MD 03/21/18 [phone]

## 2018-03-21 NOTE — ED Triage Notes (Signed)
Patient reports intermittent abdominal pain for several weeks. States over the last few days, pain has gotten worse and more constant. Patient reports loss of appetite but denies vomiting or diarrhea. Reports normal bowel movement yesterday. Patient has history of SBO and incarcerated hernia.

## 2018-03-21 NOTE — ED Notes (Signed)
Patient assisted to the room commode. Patient has a steady gait.

## 2018-04-18 ENCOUNTER — Encounter: Payer: Self-pay | Admitting: Emergency Medicine

## 2018-04-18 ENCOUNTER — Emergency Department: Payer: Medicare Other

## 2018-04-18 ENCOUNTER — Other Ambulatory Visit: Payer: Self-pay

## 2018-04-18 ENCOUNTER — Emergency Department
Admission: EM | Admit: 2018-04-18 | Discharge: 2018-04-18 | Disposition: A | Discharge status: Home / Self Care | Payer: Medicare Other | Attending: Emergency Medicine | Admitting: Emergency Medicine

## 2018-04-18 DIAGNOSIS — K5900 Constipation, unspecified: Secondary | ICD-10-CM | POA: Insufficient documentation

## 2018-04-18 DIAGNOSIS — I10 Essential (primary) hypertension: Secondary | ICD-10-CM | POA: Diagnosis not present

## 2018-04-18 DIAGNOSIS — R1030 Lower abdominal pain, unspecified: Secondary | ICD-10-CM

## 2018-04-18 DIAGNOSIS — Z79899 Other long term (current) drug therapy: Secondary | ICD-10-CM | POA: Insufficient documentation

## 2018-04-18 LAB — CBC WITH DIFFERENTIAL/PLATELET
Abs Immature Granulocytes: 0.01 10*3/uL (ref 0.00–0.07)
Basophils Absolute: 0.1 10*3/uL (ref 0.0–0.1)
Basophils Relative: 2 %
EOS ABS: 0.2 10*3/uL (ref 0.0–0.5)
Eosinophils Relative: 4 %
HEMATOCRIT: 42.5 % (ref 36.0–46.0)
HEMOGLOBIN: 13.7 g/dL (ref 12.0–15.0)
Immature Granulocytes: 0 %
LYMPHS ABS: 1.5 10*3/uL (ref 0.7–4.0)
LYMPHS PCT: 27 %
MCH: 30.3 pg (ref 26.0–34.0)
MCHC: 32.2 g/dL (ref 30.0–36.0)
MCV: 94 fL (ref 80.0–100.0)
MONO ABS: 0.5 10*3/uL (ref 0.1–1.0)
MONOS PCT: 9 %
NRBC: 0 % (ref 0.0–0.2)
Neutro Abs: 3.2 10*3/uL (ref 1.7–7.7)
Neutrophils Relative %: 58 %
Platelets: 437 10*3/uL — ABNORMAL HIGH (ref 150–400)
RBC: 4.52 MIL/uL (ref 3.87–5.11)
RDW: 12.3 % (ref 11.5–15.5)
WBC: 5.5 10*3/uL (ref 4.0–10.5)

## 2018-04-18 LAB — COMPREHENSIVE METABOLIC PANEL
ALT: <5 U/L (ref 0–44)
ANION GAP: 9 (ref 5–15)
AST: 26 U/L (ref 15–41)
Albumin: 4 g/dL (ref 3.5–5.0)
Alkaline Phosphatase: 51 U/L (ref 38–126)
BILIRUBIN TOTAL: 1.1 mg/dL (ref 0.3–1.2)
BUN: 10 mg/dL (ref 8–23)
CO2: 28 mmol/L (ref 22–32)
Calcium: 9.3 mg/dL (ref 8.9–10.3)
Chloride: 103 mmol/L (ref 98–111)
Creatinine, Ser: 0.57 mg/dL (ref 0.44–1.00)
GFR calc Af Amer: >60 mL/min (ref >60)
GLUCOSE: 100 mg/dL — AB (ref 70–99)
POTASSIUM: 3.9 mmol/L (ref 3.5–5.1)
Sodium: 140 mmol/L (ref 135–145)
Total Protein: 6.6 g/dL (ref 6.5–8.1)

## 2018-04-18 LAB — URINALYSIS, COMPLETE (UACMP) WITH MICROSCOPIC
Bacteria, UA: NONE SEEN
Bilirubin Urine: NEGATIVE
GLUCOSE, UA: NEGATIVE mg/dL
HGB URINE DIPSTICK: NEGATIVE
Ketones, ur: NEGATIVE mg/dL
LEUKOCYTES UA: NEGATIVE
NITRITE: NEGATIVE
Protein, ur: NEGATIVE mg/dL
Specific Gravity, Urine: 1.006 (ref 1.005–1.030)
pH: 8 (ref 5.0–8.0)

## 2018-04-18 MED ORDER — IOHEXOL 300 MG/ML  SOLN
75.0000 mL | Freq: Once | INTRAMUSCULAR | Status: AC | PRN
  Administered 2018-04-18: 75 mL via INTRAVENOUS

## 2018-04-18 MED ORDER — IOPAMIDOL (ISOVUE-300) INJECTION 61%
30.0000 mL | Freq: Once | INTRAVENOUS | Status: AC | PRN
  Administered 2018-04-18: 30 mL via ORAL

## 2018-04-18 NOTE — ED Triage Notes (Signed)
PT arrived with complaints of constipation. Last bowel movement yesterday but pt reports "it was very little." Pt reports using otc remedies but denies relief.

## 2018-04-18 NOTE — ED Provider Notes (Signed)
[facility] Emergency Department Provider Note       Time seen: ----------------------------------------- 9:32 AM on 04/18/2018 -----------------------------------------   I have reviewed the triage vital signs and the nursing notes.  HISTORY   Chief Complaint Constipation    HPI Barbara Vega is a 84 y.o. female with a history of arthritis, anemia, hypertension, diverticulitis who presents to the ED for the patient.  Patient states her last bowel movement was yesterday but reports it was very little.  She uses over-the-counter remedies and laxatives with only mild relief.  She reports this started about a month ago after she was diagnosed with diverticulitis.  She denies fevers, chills or other complaints.  Past Medical History:  Diagnosis Date  . Adenomatous colon polyp   . Anemia    hx  . Arthritis   . Benign breast lumps   . History of colon polyps    benign  . Hypertension    takes Triamterene-HCTZ daily  . Insomnia    takes Restoril nightly  . Macular degeneration    dry  . Osteoporosis   . Sinus infection    taking Ceftin daily     Patient Active Problem List   Diagnosis Date Noted  . Incarcerated left inguinal hernia   . Small bowel obstruction ([place])   . Hyponatremia 11/20/2016  . GERD (gastroesophageal reflux disease) 09/20/2016  . Dysphagia, unspecified 08/09/2016  . Papilloma of breast 04/03/2015  . Osteoporosis, post-menopausal 03/04/2015  . Cannot sleep 03/04/2015  . BP (high blood pressure) 03/04/2015  . Adenomatous colon polyp 03/04/2015    Past Surgical History:  Procedure Laterality Date  . ABDOMINAL HYSTERECTOMY    . BREAST LUMPECTOMY Right 09/08/2015   intraductal papilloma  . BREAST LUMPECTOMY Left    2 bx done only one scar seen years ago neg  . cataract surgery Bilateral   . COLONOSCOPY    . COLONOSCOPY WITH PROPOFOL N/A 11/03/2016   Procedure: COLONOSCOPY WITH PROPOFOL;  Surgeon: Manya Silvas, MD;   Location: [facility] ENDOSCOPY;  Service: Endoscopy;  Laterality: N/A;  . ESOPHAGOGASTRODUODENOSCOPY (EGD) WITH PROPOFOL N/A 11/03/2016   Procedure: ESOPHAGOGASTRODUODENOSCOPY (EGD) WITH PROPOFOL;  Surgeon: Manya Silvas, MD;  Location: [facility] ENDOSCOPY;  Service: Endoscopy;  Laterality: N/A;  . INGUINAL HERNIA REPAIR Left 01/15/2017   Procedure: HERNIA REPAIR INGUINAL ADULT;  Surgeon: Florene Glen, MD;  Location: [facility] ORS;  Service: General;  Laterality: Left;  . RADIOACTIVE SEED GUIDED EXCISIONAL BREAST BIOPSY Right 09/08/2015   Procedure: RADIOACTIVE SEED GUIDED EXCISIONAL BREAST BIOPSY;  Surgeon: Rolm Bookbinder, MD;  Location: [place];  Service: General;  Laterality: Right;    Allergies Codone [hydrocodone]  Social History Social History   Tobacco Use  . Smoking status: Never Smoker  . Smokeless tobacco: Never Used  Substance Use Topics  . Alcohol use: No    Alcohol/week: 0.0 standard drinks  . Drug use: No   Review of Systems Constitutional: Negative for fever. Cardiovascular: Negative for chest pain. Respiratory: Negative for shortness of breath. Gastrointestinal: Positive for abdominal pain, constipation Genitourinary: Negative for dysuria. Musculoskeletal: Negative for back pain. Skin: Negative for rash. Neurological: Negative for headaches, focal weakness or numbness.  All systems negative/normal/unremarkable except as stated in the HPI  ____________________________________________   PHYSICAL EXAM:  VITAL SIGNS: ED Triage Vitals  Enc Vitals Group     BP 04/18/18 0840 (!) 157/65     Pulse Rate 04/18/18 0840 73     Resp 04/18/18 0840 18  Temp 04/18/18 0840 98.1 F (36.7 C)     Temp Source 04/18/18 0840 Oral     SpO2 04/18/18 0840 97 %     Weight --      Height --      Head Circumference --      Peak Flow --      Pain Score 04/18/18 0844 0     Pain Loc --      Pain Edu? --      Excl. in [place]? --    Constitutional: Alert and oriented. Well appearing and  in no distress. Eyes: Conjunctivae are normal. Normal extraocular movements. Cardiovascular: Normal rate, regular rhythm. No murmurs, rubs, or gallops. Respiratory: Normal respiratory effort without tachypnea nor retractions. Breath sounds are clear and equal bilaterally. No wheezes/rales/rhonchi. Gastrointestinal: Soft and nontender. Normal bowel sounds Rectal: No stool in the vault, heme-negative, no blood evident Musculoskeletal: Nontender with normal range of motion in extremities. No lower extremity tenderness nor edema. Neurologic:  Normal speech and language. No gross focal neurologic deficits are appreciated.  Skin:  Skin is warm, dry and intact. No rash noted. Psychiatric: Mood and affect are normal. Speech and behavior are normal.  ____________________________________________  ED COURSE:  As part of my medical decision making, I reviewed the following data within the [place] History obtained from family if available, nursing notes, old chart and ekg, as well as notes from prior ED visits. Patient presented for persistent abdominal pain and constipation, we will assess with labs and imaging as indicated at this time.   Procedures ____________________________________________   LABS (pertinent positives/negatives)  Labs Reviewed  CBC WITH DIFFERENTIAL/PLATELET - Abnormal; Notable for the following components:      Result Value   Platelets 437 (*)    All other components within normal limits  COMPREHENSIVE METABOLIC PANEL - Abnormal; Notable for the following components:   Glucose, Bld 100 (*)    All other components within normal limits  URINALYSIS, COMPLETE (UACMP) WITH MICROSCOPIC - Abnormal; Notable for the following components:   Color, Urine YELLOW (*)    APPearance HAZY (*)    All other components within normal limits    RADIOLOGY Images were viewed by me  CT the abdomen pelvis with contrast IMPRESSION: 1. No acute or inflammatory process  identified in the abdomen or pelvis. 2. Stable sigmoid diverticulosis without active inflammation. Less retained stool in the large bowel compared to 1 month ago. 3. Stable left adrenal nodule since 2018, most likely benign adenoma. ____________________________________________  DIFFERENTIAL DIAGNOSIS   Constipation, diverticulitis, diverticulosis, intra-abdominal abscess, UTI, chronic pain  FINAL ASSESSMENT AND PLAN  Abdominal pain, mild constipation   Plan: The patient had presented for persistent constipation. Patient's labs are unremarkable. Patient's imaging was negative.  She is cleared for outpatient follow-up, likely scar tissue is the cause for her pain.   Laurence Aly, MD   Note: This note was generated in part or whole with voice recognition software. Voice recognition is usually quite accurate but there are transcription errors that can and very often do occur. I apologize for any typographical errors that were not detected and corrected.     Earleen Newport, MD 04/18/18 1128

## 2018-11-07 ENCOUNTER — Other Ambulatory Visit: Payer: Self-pay | Admitting: Family Medicine

## 2018-11-07 DIAGNOSIS — Z1231 Encounter for screening mammogram for malignant neoplasm of breast: Secondary | ICD-10-CM

## 2018-12-15 ENCOUNTER — Ambulatory Visit
Admission: RE | Admit: 2018-12-15 | Discharge: 2018-12-15 | Disposition: A | Discharge status: Home / Self Care | Payer: Medicare Other | Source: Ambulatory Visit | Attending: Family Medicine | Admitting: Family Medicine

## 2018-12-15 ENCOUNTER — Other Ambulatory Visit: Payer: Self-pay

## 2018-12-15 DIAGNOSIS — Z1231 Encounter for screening mammogram for malignant neoplasm of breast: Secondary | ICD-10-CM | POA: Diagnosis not present

## 2019-01-19 ENCOUNTER — Ambulatory Visit: Payer: Medicare Other | Admitting: Urology

## 2019-01-26 ENCOUNTER — Ambulatory Visit: Payer: Medicare Other | Admitting: Urology

## 2019-01-26 ENCOUNTER — Other Ambulatory Visit: Payer: Self-pay

## 2019-01-26 ENCOUNTER — Encounter: Payer: Self-pay | Admitting: Urology

## 2019-01-26 VITALS — BP 147/75 | HR 67

## 2019-01-26 DIAGNOSIS — N390 Urinary tract infection, site not specified: Secondary | ICD-10-CM

## 2019-01-26 DIAGNOSIS — N952 Postmenopausal atrophic vaginitis: Secondary | ICD-10-CM

## 2019-01-26 DIAGNOSIS — R339 Retention of urine, unspecified: Secondary | ICD-10-CM | POA: Diagnosis not present

## 2019-01-26 DIAGNOSIS — N8111 Cystocele, midline: Secondary | ICD-10-CM | POA: Diagnosis not present

## 2019-01-26 DIAGNOSIS — R32 Unspecified urinary incontinence: Secondary | ICD-10-CM | POA: Diagnosis not present

## 2019-01-26 LAB — BLADDER SCAN AMB NON-IMAGING

## 2019-01-26 MED ORDER — PREMARIN 0.625 MG/GM VA CREA: 1.0000 | TOPICAL_CREAM | Freq: Every day | VAGINAL | 12 refills | Status: DC

## 2019-01-26 NOTE — Patient Instructions (Signed)
Atrophic Vaginitis Atrophic vaginitis is a condition in which the tissues that line the vagina become dry and thin. This condition occurs in women who have stopped having their period. It is caused by a drop in a female hormone (estrogen). This hormone helps:  To keep the vagina moist.  To make a clear fluid. This clear fluid helps: ? To make the vagina ready for sex. ? To protect the vagina from infection. If the lining of the vagina is dry and thin, it may cause irritation, burning, or itchiness. It may also:  Make sex painful.  Make an exam of your vagina painful.  Cause bleeding.  Make you lose interest in sex.  Cause a burning feeling when you pee (urinate).  Cause a brown or yellow fluid to come from your vagina. Some women do not have symptoms. Follow these instructions at home: Medicines  Take over-the-counter and prescription medicines only as told by your doctor.  Do not use herbs or other medicines unless your doctor says it is okay.  Use medicines for for dryness. These include: ? Oils to make the vagina soft. ? Creams. ? Moisturizers. General instructions  Do not douche.  Do not use products that can make your vagina dry. These include: ? Scented sprays. ? Scented tampons. ? Scented soaps.  Sex can help increase blood flow and soften the tissue in the vagina. If it hurts to have sex: ? Tell your partner. ? Use products to make sex more comfortable. Use these only as told by your doctor. Contact a doctor if you:  Have discharge from the vagina that is different than usual.  Have a bad smell coming from your vagina.  Have new symptoms.  Do not get better.  Get worse. Summary  Atrophic vaginitis is a condition in which the lining of the vagina becomes dry and thin.  This condition affects women who have stopped having their periods.  Treatment may include using products that help make the vagina soft.  Call a doctor if do not get better with  treatment. This information is not intended to replace advice given to you by your health care provider. Make sure you discuss any questions you have with your health care provider. Document Released: 10/20/2007 Document Revised: 05/16/2017 Document Reviewed: 05/16/2017 Elsevier Patient Education  2020 Elsevier Inc.  

## 2019-01-26 NOTE — Progress Notes (Signed)
01/26/2019 10:21 AM   Barbara Vega 1933-09-27 [id number]  Referring provider: [name], MD [address] [facility] [place],  [place] [address]  Chief Complaint  Patient presents with  . Urinary Incontinence    follow up  . Dysuria    HPI: 85 year old female who presents today for further evaluation of recurrent urinary tract infections/burning with urination.  She is previously seen and evaluated by Bjorn Loser in 2018 for grade 2 cystocele.  She was minimally symptomatic at the time and conservative management was recommended.  She never pursued pessary.  More recently, she has been having episodes of burning before during and after urination.  This been going on for several months.  Review of data revealed that she has been treated on several occasions for presumed UTI.  Reviewed care of her I did indicate that she has been treated on several occasions with antibiotics.  On one occasion, she did in fact grow Citrobacter on 12/01/2018.  2 subsequent UAs are completely negative.  She was treated for presumed UTI on both of these occasions despite having negative UA/urine cultures.  She continues to have burning with urination today.  This extends beyond urination as well and is external in nature.  She denies any gross hematuria.  No fevers or chills.  Her history is somewhat limited today as she does have mild dementia and healthcare proxy, her daughter who is present with her today but the patient herself prefers her not to be in the room for history taking and exam.  No alleviating factors.  No personal history of GYN malignancies.  She did have a breast excision which is for benign pathology.  PMH: Past Medical History:  Diagnosis Date  . Adenomatous colon polyp   . Anemia    hx  . Arthritis   . Benign breast lumps   . History of colon polyps    benign  . Hypertension    takes Triamterene-HCTZ daily  . Insomnia    takes Restoril nightly  . Macular  degeneration    dry  . Osteoporosis   . Sinus infection    taking Ceftin daily     Surgical History: Past Surgical History:  Procedure Laterality Date  . ABDOMINAL HYSTERECTOMY    . BREAST EXCISIONAL BIOPSY    . BREAST LUMPECTOMY Right 09/08/2015   intraductal papilloma  . BREAST LUMPECTOMY Left    2 bx done only one scar seen years ago neg  . cataract surgery Bilateral   . COLONOSCOPY    . COLONOSCOPY WITH PROPOFOL N/A 11/03/2016   Procedure: COLONOSCOPY WITH PROPOFOL;  Surgeon: Manya Silvas, MD;  Location[facility] ENDOSCOPY;  Service: Endoscopy;  Laterality: N/A;  . ESOPHAGOGASTRODUODENOSCOPY (EGD) WITH PROPOFOL N/A 11/03/2016   Procedure: ESOPHAGOGASTRODUODENOSCOPY (EGD) WITH PROPOFOL;  Surgeon: Manya Silvas, MD;  Location: [facility] ENDOSCOPY;  Service: Endoscopy;  Laterality: N/A;  . INGUINAL HERNIA REPAIR Left 01/15/2017   Procedure: HERNIA REPAIR INGUINAL ADULT;  Surgeon: Florene Glen, MD;  Location: [facility] ORS;  Service: General;  Laterality: Left;  . RADIOACTIVE SEED GUIDED EXCISIONAL BREAST BIOPSY Right 09/08/2015   Procedure: RADIOACTIVE SEED GUIDED EXCISIONAL BREAST BIOPSY;  Surgeon: Rolm Bookbinder, MD;  Location: [place];  Service: General;  Laterality: Right;    Home Medications:  Allergies as of 01/26/2019      Reactions   Codone [hydrocodone] Nausea And Vomiting      Medication List       Accurate as of January 26, 2019 10:21 AM. If you have any questions, ask your nurse or doctor.        aspirin EC 81 MG tablet Take 81 mg by mouth daily.   calcium carbonate 1250 (500 Ca) MG tablet Commonly known as: OS-CAL - dosed in mg of elemental calcium Take 1 tablet by mouth daily with breakfast.   cetirizine 10 MG tablet Commonly known as: ZYRTEC Take 10 mg by mouth daily as needed.   cholecalciferol 1000 units tablet Commonly known as: VITAMIN D Take 1,000 Units by mouth daily.   ciprofloxacin 500 MG tablet Commonly known as: Cipro Take 1 tablet (500  mg total) by mouth 2 (two) times daily.   esomeprazole 20 MG capsule Commonly known as: NEXIUM Take 20 mg by mouth daily as needed.   KRILL OIL PO Take 1 tablet by mouth daily.   lisinopril 5 MG tablet Commonly known as: ZESTRIL Take 1 tablet (5 mg total) by mouth daily.   methylPREDNISolone 4 MG tablet Commonly known as: Medrol Disp 4mg  dosepak.  Follow directions on packaging.   metroNIDAZOLE 500 MG tablet Commonly known as: FLAGYL Take 1 tablet (500 mg total) by mouth 2 (two) times daily after a meal.   Premarin vaginal cream Generic drug: conjugated estrogens Place 1 Applicatorful vaginally daily. Use pea sized amount M-W-Fr before bedtime Started by: Hollice Espy, MD   temazepam 30 MG capsule Commonly known as: RESTORIL Take 30 mg by mouth at bedtime.   traMADol 50 MG tablet Commonly known as: Ultram Take 1 tablet (50 mg total) by mouth every 6 (six) hours as needed.       Allergies:  Allergies  Allergen Reactions  . Codone [Hydrocodone] Nausea And Vomiting    Family History: Family History  Problem Relation Age of Onset  . Osteoporosis Mother   . Heart failure Mother   . Heart block Brother   . Alcohol abuse Brother   . Bladder Cancer Neg Hx   . Kidney cancer Neg Hx     Social History:  reports that she has never smoked. She has never used smokeless tobacco. She reports that she does not drink alcohol or use drugs.  ROS: UROLOGY Frequent Urination?: Yes Hard to postpone urination?: Yes Burning/pain with urination?: Yes Get up at night to urinate?: Yes Leakage of urine?: No Urine stream starts and stops?: No Trouble starting stream?: No Do you have to strain to urinate?: No Blood in urine?: No Urinary tract infection?: Yes Sexually transmitted disease?: No Injury to kidneys or bladder?: No Painful intercourse?: No Weak stream?: No Currently pregnant?: No Vaginal bleeding?: No Last menstrual period?: n  Gastrointestinal Nausea?: Yes  Vomiting?: No Indigestion/heartburn?: Yes Diarrhea?: No Constipation?: No  Constitutional Fever: No Night sweats?: No Weight loss?: No Fatigue?: No  Skin Skin rash/lesions?: No Itching?: No  Eyes Blurred vision?: No Double vision?: No  Ears/Nose/Throat Sore throat?: No Sinus problems?: Yes  Hematologic/Lymphatic Swollen glands?: No Easy bruising?: No  Cardiovascular Leg swelling?: No Chest pain?: No  Respiratory Cough?: No Shortness of breath?: No  Endocrine Excessive thirst?: No  Musculoskeletal Back pain?: Yes Joint pain?: No  Neurological Headaches?: No Dizziness?: No  Psychologic Depression?: No Anxiety?: Yes  Physical Exam: BP (!) 147/75   Pulse 67   Constitutional:  Alert and oriented, No acute distress. HEENT: [place] AT, moist mucus membranes.  Trachea midline, no masses. Cardiovascular: No clubbing, cyanosis, or edema. Respiratory: Normal respiratory effort, no increased work of breathing. GI: Abdomen is soft, nontender,  nondistended, no abdominal masses Pelvic: Chaperoned by Fonnie Jarvis, CMA.  Normal external genitalia.  Significantly atrophic vaginal and periurethral mucosa, slightly patent urethra with mobility with Valsalva.  Small urethral caruncle.  Mild stage I versus 2 cystocele. Skin: No rashes, bruises or suspicious lesions. Neurologic: Grossly intact, no focal deficits, moving all 4 extremities. Psychiatric: Normal mood and affect.  Laboratory Data: Lab Results  Component Value Date   WBC 5.5 04/18/2018   HGB 13.7 04/18/2018   HCT 42.5 04/18/2018   MCV 94.0 04/18/2018   PLT 437 (H) 04/18/2018    Lab Results  Component Value Date   CREATININE 0.57 04/18/2018   Multiple labs from care everywhere reviewed, see epic for details including UA and blood work.  Pertinent Imaging: Results for orders placed or performed in visit on 01/26/19  BLADDER SCAN AMB NON-IMAGING  Result Value Ref Range   Scan Result 169ml      Assessment & Plan:    1. Atrophic vaginitis Symptoms including exterior burning likely related to vaginal atrophy rather than true UTI  I recommended starting topical estrogen cream to use once daily for 2 weeks and then 3 times a week per urethra.  This was discussed with her daughter today and is agreeable this plan.  This will also help with UTI prevention.  2. Recurrent UTI Single isolated UTI over the past year, suspect #1 as cause of her symptoms  3. Cystocele, midline Mild cystocele, otherwise asymptomatic Suboptimal surgical candidate - BLADDER SCAN AMB NON-IMAGING  4. Incomplete bladder emptying Mildly elevated PVR today, likely related to cystocele  REturn as needed  Hollice Espy, MD  [place] [address] [place], [place] [address] [phone]

## 2019-01-29 ENCOUNTER — Ambulatory Visit: Payer: Medicare Other | Admitting: Urology

## 2019-01-30 ENCOUNTER — Ambulatory Visit: Payer: Medicare Other | Admitting: Urology

## 2019-03-06 ENCOUNTER — Ambulatory Visit: Payer: Medicare Other | Admitting: Podiatry

## 2019-03-06 ENCOUNTER — Other Ambulatory Visit: Payer: Self-pay

## 2019-03-06 DIAGNOSIS — L600 Ingrowing nail: Secondary | ICD-10-CM | POA: Diagnosis not present

## 2019-03-09 NOTE — Progress Notes (Signed)
   Subjective: Patient presents today for evaluation of pain to the lateral border of the left foot that began about one month ago. Patient is concerned for possible ingrown nail. Touching the toe increases the pain. She has tried cutting the nail out herself and has been soaking it in vinegar water. Patient presents today for further treatment and evaluation.  Past Medical History:  Diagnosis Date  . Adenomatous colon polyp   . Anemia    hx  . Arthritis   . Benign breast lumps   . History of colon polyps    benign  . Hypertension    takes Triamterene-HCTZ daily  . Insomnia    takes Restoril nightly  . Macular degeneration    dry  . Osteoporosis   . Sinus infection    taking Ceftin daily     Objective:  General: Well developed, nourished, in no acute distress, alert and oriented x3   Dermatology: Skin is warm, dry and supple bilateral. Lateral border left hallux appears to be erythematous with evidence of an ingrowing nail. Pain on palpation noted to the border of the nail fold. The remaining nails appear unremarkable at this time. There are no open sores, lesions.  Vascular: Dorsalis Pedis artery and Posterior Tibial artery pedal pulses palpable. No lower extremity edema noted.   Neruologic: Grossly intact via light touch bilateral.  Musculoskeletal: Muscular strength within normal limits in all groups bilateral. Normal range of motion noted to all pedal and ankle joints.   Assesement: #1 Paronychia with ingrowing nail lateral border left hallux  #2 Pain in toe #3 Incurvated nail  Plan of Care:  1. Patient evaluated.  2. Mechanical debridement of the left great toenail performed using a nail nipper. Filed with dremel without incident.  3. Recommended good shoe gear.  4. Return to clinic in 3 months for routine nail care.   Edrick Kins, DPM [facility]  Dr. Edrick Kins, [place]                                         [place], [place] [address]                Office [phone]  Fax [phone]

## 2019-04-21 ENCOUNTER — Emergency Department
Admission: EM | Admit: 2019-04-21 | Discharge: 2019-04-22 | Disposition: A | Discharge status: Home / Self Care | Payer: Medicare Other | Attending: Emergency Medicine | Admitting: Emergency Medicine

## 2019-04-21 ENCOUNTER — Encounter: Payer: Self-pay | Admitting: Emergency Medicine

## 2019-04-21 ENCOUNTER — Other Ambulatory Visit: Payer: Self-pay

## 2019-04-21 ENCOUNTER — Emergency Department: Payer: Medicare Other

## 2019-04-21 DIAGNOSIS — S22080A Wedge compression fracture of T11-T12 vertebra, initial encounter for closed fracture: Secondary | ICD-10-CM | POA: Insufficient documentation

## 2019-04-21 DIAGNOSIS — Y939 Activity, unspecified: Secondary | ICD-10-CM | POA: Diagnosis not present

## 2019-04-21 DIAGNOSIS — S3992XA Unspecified injury of lower back, initial encounter: Secondary | ICD-10-CM | POA: Diagnosis present

## 2019-04-21 DIAGNOSIS — Y33XXXA Other specified events, undetermined intent, initial encounter: Secondary | ICD-10-CM | POA: Diagnosis not present

## 2019-04-21 DIAGNOSIS — Y929 Unspecified place or not applicable: Secondary | ICD-10-CM | POA: Insufficient documentation

## 2019-04-21 DIAGNOSIS — Z7982 Long term (current) use of aspirin: Secondary | ICD-10-CM | POA: Diagnosis not present

## 2019-04-21 DIAGNOSIS — Y999 Unspecified external cause status: Secondary | ICD-10-CM | POA: Diagnosis not present

## 2019-04-21 DIAGNOSIS — I10 Essential (primary) hypertension: Secondary | ICD-10-CM | POA: Diagnosis not present

## 2019-04-21 DIAGNOSIS — Z79899 Other long term (current) drug therapy: Secondary | ICD-10-CM | POA: Diagnosis not present

## 2019-04-21 LAB — BASIC METABOLIC PANEL
Anion gap: 7 (ref 5–15)
BUN: 15 mg/dL (ref 8–23)
CO2: 24 mmol/L (ref 22–32)
Calcium: 9.1 mg/dL (ref 8.9–10.3)
Chloride: 101 mmol/L (ref 98–111)
Creatinine, Ser: 0.59 mg/dL (ref 0.44–1.00)
GFR calc Af Amer: >60 mL/min (ref >60)
GFR calc non Af Amer: >60 mL/min (ref >60)
Glucose, Bld: 113 mg/dL — ABNORMAL HIGH (ref 70–99)
Potassium: 4.2 mmol/L (ref 3.5–5.1)
Sodium: 132 mmol/L — ABNORMAL LOW (ref 135–145)

## 2019-04-21 LAB — URINALYSIS, COMPLETE (UACMP) WITH MICROSCOPIC
Bilirubin Urine: NEGATIVE
Glucose, UA: NEGATIVE mg/dL
Hgb urine dipstick: NEGATIVE
Ketones, ur: NEGATIVE mg/dL
Nitrite: NEGATIVE
Protein, ur: NEGATIVE mg/dL
Specific Gravity, Urine: 1.012 (ref 1.005–1.030)
pH: 7 (ref 5.0–8.0)

## 2019-04-21 LAB — CBC
HCT: 41.7 % (ref 36.0–46.0)
Hemoglobin: 13.6 g/dL (ref 12.0–15.0)
MCH: 29.8 pg (ref 26.0–34.0)
MCHC: 32.6 g/dL (ref 30.0–36.0)
MCV: 91.4 fL (ref 80.0–100.0)
Platelets: 535 10*3/uL — ABNORMAL HIGH (ref 150–400)
RBC: 4.56 MIL/uL (ref 3.87–5.11)
RDW: 13 % (ref 11.5–15.5)
WBC: 12.2 10*3/uL — ABNORMAL HIGH (ref 4.0–10.5)
nRBC: 0 % (ref 0.0–0.2)

## 2019-04-21 MED ORDER — TRAMADOL HCL 50 MG PO TABS
50.0000 mg | ORAL_TABLET | ORAL | Status: AC
  Administered 2019-04-21: 50 mg via ORAL
  Filled 2019-04-21: qty 1

## 2019-04-21 MED ORDER — IOHEXOL 300 MG/ML  SOLN
100.0000 mL | Freq: Once | INTRAMUSCULAR | Status: AC | PRN
  Administered 2019-04-21: 100 mL via INTRAVENOUS

## 2019-04-21 MED ORDER — ONDANSETRON 4 MG PO TBDP
4.0000 mg | ORAL_TABLET | Freq: Once | ORAL | Status: AC
  Administered 2019-04-21: 4 mg via ORAL
  Filled 2019-04-21: qty 1

## 2019-04-21 MED ORDER — TRAMADOL HCL 50 MG PO TABS
50.0000 mg | ORAL_TABLET | ORAL | Status: AC
  Administered 2019-04-22: 50 mg via ORAL
  Filled 2019-04-21: qty 1

## 2019-04-21 MED ORDER — TRAMADOL HCL 50 MG PO TABS: 50.0000 mg | ORAL_TABLET | Freq: Four times a day (QID) | ORAL | 0 refills | Status: DC | PRN

## 2019-04-21 NOTE — ED Notes (Signed)
Patient transported to CT 

## 2019-04-21 NOTE — ED Provider Notes (Signed)
[facility] Emergency Department Provider Note   ____________________________________________   First MD Initiated Contact with Patient 04/21/19 1812     (approximate)  I have reviewed the triage vital signs and the nursing notes.   HISTORY  Chief Complaint Flank Pain    HPI Barbara Vega is a 85 y.o. female history of hypertension, osteoporosis.  Patient reports since Tuesday she has been experiencing pain in her left lower back and flank.  Its been persistent.  She reports it seemed to have started after she was moving a paving type stone, wonders if she might of pulled a muscle in her back but it is very uncomfortable.  Reports she does need some of her pain.  No numbness tingling or weakness.  Reports that pain and discomfort make it hard for her to get up out of bed, but when she is up she is able to get around but with some pain in the left lower back.  No fevers or chills.  No nausea or vomiting.  Does report she has had a feeling of "like a "constipation" feeling for the last couple days as well.  No black or bloody stools.  No vomiting   Past Medical History:  Diagnosis Date   Adenomatous colon polyp    Anemia    hx   Arthritis    Benign breast lumps    History of colon polyps    benign   Hypertension    takes Triamterene-HCTZ daily   Insomnia    takes Restoril nightly   Macular degeneration    dry   Osteoporosis    Sinus infection    taking Ceftin daily     Patient Active Problem List   Diagnosis Date Noted   DDD (degenerative disc disease), lumbar 06/16/2017   SI (sacroiliac) joint dysfunction 06/16/2017   Incarcerated left inguinal hernia    Small bowel obstruction (HCC)    Hyponatremia 11/20/2016   GERD (gastroesophageal reflux disease) 09/20/2016   Dysphagia, unspecified 08/09/2016   Papilloma of breast 04/03/2015   Osteoporosis, post-menopausal 03/04/2015   Cannot sleep 03/04/2015   BP (high blood  pressure) 03/04/2015   Adenomatous colon polyp 03/04/2015    Past Surgical History:  Procedure Laterality Date   ABDOMINAL HYSTERECTOMY     BREAST EXCISIONAL BIOPSY     BREAST LUMPECTOMY Right 09/08/2015   intraductal papilloma   BREAST LUMPECTOMY Left    2 bx done only one scar seen years ago neg   cataract surgery Bilateral    COLONOSCOPY     COLONOSCOPY WITH PROPOFOL N/A 11/03/2016   Procedure: COLONOSCOPY WITH PROPOFOL;  Surgeon: Manya Silvas, MD;  Location: [facility] ENDOSCOPY;  Service: Endoscopy;  Laterality: N/A;   ESOPHAGOGASTRODUODENOSCOPY (EGD) WITH PROPOFOL N/A 11/03/2016   Procedure: ESOPHAGOGASTRODUODENOSCOPY (EGD) WITH PROPOFOL;  Surgeon: Manya Silvas, MD;  Location: [facility] ENDOSCOPY;  Service: Endoscopy;  Laterality: N/A;   INGUINAL HERNIA REPAIR Left 01/15/2017   Procedure: HERNIA REPAIR INGUINAL ADULT;  Surgeon: Florene Glen, MD;  Location: [facility] ORS;  Service: General;  Laterality: Left;   RADIOACTIVE SEED GUIDED EXCISIONAL BREAST BIOPSY Right 09/08/2015   Procedure: RADIOACTIVE SEED GUIDED EXCISIONAL BREAST BIOPSY;  Surgeon: Rolm Bookbinder, MD;  Location: [facility];  Service: General;  Laterality: Right;    Prior to Admission medications   Medication Sig Start Date End Date Taking? Authorizing Provider  amLODipine (NORVASC) 2.5 MG tablet Take 2.5 mg by mouth daily. 11/06/18   [provider]  aspirin  EC 81 MG tablet Take 81 mg by mouth daily.    [provider]  calcium carbonate (OS-CAL - DOSED IN MG OF ELEMENTAL CALCIUM) 1250 (500 CA) MG tablet Take 1 tablet by mouth daily with breakfast.     [provider]  cholecalciferol (VITAMIN D) 1000 UNITS tablet Take 1,000 Units by mouth daily.    [provider]  conjugated estrogens (PREMARIN) vaginal cream Place 1 Applicatorful vaginally daily. Use pea sized amount M-W-Fr before bedtime 01/26/19   Hollice Espy, MD  KRILL OIL PO Take 1 tablet by mouth daily.     [provider]  temazepam (RESTORIL) 30 MG capsule Take 30 mg by mouth at bedtime.  09/13/14   [provider]  traMADol (ULTRAM) 50 MG tablet Take 1 tablet (50 mg total) by mouth every 6 (six) hours as needed for severe pain. 04/21/19   Delman Kitten, MD    Allergies Codone [hydrocodone]  Family History  Problem Relation Age of Onset   Osteoporosis Mother    Heart failure Mother    Heart block Brother    Alcohol abuse Brother    Bladder Cancer Neg Hx    Kidney cancer Neg Hx     Social History Social History   Tobacco Use   Smoking status: Never Smoker   Smokeless tobacco: Never Used  Substance Use Topics   Alcohol use: No    Alcohol/week: 0.0 standard drinks   Drug use: No    Review of Systems Constitutional: No fever/chills or recent illness Eyes: No visual changes. Cardiovascular: Denies chest pain. Respiratory: Denies shortness of breath. Gastrointestinal: No abdominal pain.  Reports a "constipated"-like feeling for a few days. Musculoskeletal: See HPI.  Denies pain in the middle of the back, is located primarily of the left lower pelvis left buttock region worse with movement and bending Skin: Negative for rash. Neurological: Negative for headaches, areas of focal weakness or numbness.    ____________________________________________   PHYSICAL EXAM:  VITAL SIGNS: ED Triage Vitals  Enc Vitals Group     BP 04/21/19 1434 (!) 167/60     Pulse Rate 04/21/19 1434 69     Resp 04/21/19 1434 16     Temp 04/21/19 1434 98.9 F (37.2 C)     Temp Source 04/21/19 1434 Oral     SpO2 04/21/19 1434 96 %     Weight 04/21/19 1447 140 lb (63.5 kg)     Height 04/21/19 1447 5\' 2"  (1.575 m)     Head Circumference --      Peak Flow --      Pain Score 04/21/19 1447 8     Pain Loc --      Pain Edu? --      Excl. in [place]? --     Constitutional: Alert and oriented. Well appearing and in no acute distress. Eyes: Conjunctivae are normal. Head:  Atraumatic. Nose: No congestion/rhinnorhea. Mouth/Throat: Mucous membranes are moist. Neck: No stridor.  Cardiovascular: Normal rate, regular rhythm. Grossly normal heart sounds.  Good peripheral circulation. Respiratory: Normal respiratory effort.  No retractions. Lungs CTAB. Gastrointestinal: Soft and nontender except she does report some mild to moderate discomfort in the left lower quadrant without rebound or guarding. No distention. Musculoskeletal: No lower extremity tenderness nor edema.  Moves all extremities well, reports some pain with flexion extension of the left hip over the left lower back buttock gluteal region.  To examination she does have some point tenderness around the sciatic notch  and also along the left lower paraspinous region and upper lumbar to the low thoracic region.  No midline tenderness step-offs or lesions except the high lumbar low  thoracic region.  Normal strength lower extremities bilateral Neurologic:  Normal speech and language. No gross focal neurologic deficits are appreciated.  Skin:  Skin is warm, dry and intact. No rash noted. Psychiatric: Mood and affect are normal. Speech and behavior are normal.  ____________________________________________   LABS (all labs ordered are listed, but only abnormal results are displayed)  Labs Reviewed  URINALYSIS, COMPLETE (UACMP) WITH MICROSCOPIC - Abnormal; Notable for the following components:      Result Value   Color, Urine YELLOW (*)    APPearance HAZY (*)    Leukocytes,Ua SMALL (*)    Bacteria, UA RARE (*)    All other components within normal limits  CBC - Abnormal; Notable for the following components:   WBC 12.2 (*)    Platelets 535 (*)    All other components within normal limits  BASIC METABOLIC PANEL - Abnormal; Notable for the following components:   Sodium 132 (*)    Glucose, Bld 113 (*)    All other components within normal limits  URINE CULTURE    ____________________________________________  EKG   ____________________________________________  RADIOLOGY  Ct Abdomen Pelvis W Contrast  Result Date: 04/21/2019 CLINICAL DATA:  Back pain, minor trauma EXAM: CT ABDOMEN AND PELVIS WITH CONTRAST TECHNIQUE: Multidetector CT imaging of the abdomen and pelvis was performed using the standard protocol following bolus administration of intravenous contrast. CONTRAST:  162mL OMNIPAQUE IOHEXOL 300 MG/ML  SOLN COMPARISON:  April 18, 2018 FINDINGS: Lower chest: The visualized heart size within normal limits. No pericardial fluid/thickening. No hiatal hernia. Mild streaky atelectasis seen at the right lung base. Hepatobiliary: Again noted are 2 low-density lesions within the left liver lobe the largest measuring 1 cm.The main portal vein is patent. No evidence of calcified gallstones, gallbladder wall thickening or biliary dilatation. Pancreas: Unremarkable. No pancreatic ductal dilatation or surrounding inflammatory changes. Spleen: Normal in size without focal abnormality. Adrenals/Urinary Tract: There is a stable heterogeneous low-density lesion within the left adrenal gland measuring 2 cm. The kidneys and collecting system appear normal without evidence of urinary tract calculus or hydronephrosis. Bladder is unremarkable. Stomach/Bowel: The stomach, small bowel, and colon are normal in appearance. Scattered colonic diverticula are noted without diverticulitis. No inflammatory changes, wall thickening, or obstructive findings. Vascular/Lymphatic: There are no enlarged mesenteric, retroperitoneal, or pelvic lymph nodes. Scattered aortic atherosclerotic calcifications for are seen without aneurysmal dilatation. Reproductive: The patient is status post hysterectomy. No adnexal masses or collections seen. Other: No evidence of abdominal wall mass or hernia. Musculoskeletal: Since the prior exam of 2019 there is slight inferior compression fracture of the T12  vertebral body with less than 25% loss in height. No retropulsion of fragments is seen. There are degenerative changes with endplate sclerosis again noted at L2-L3 and L4-L5 which have progressed since the prior exam. IMPRESSION: 1. Acute/subacute inferior compression deformity of the T12 vertebral body with less than 25% loss in height. No retropulsion of fragments. 2. No other acute intra-abdominal or pelvic pathology to explain the patient's symptoms. 3.  Aortic Atherosclerosis (ICD10-I70.0). Electronically Signed   By: Prudencio Pair M.D.   On: 04/21/2019 22:54    ____________________________________________   PROCEDURES  Procedure(s) performed: None  Procedures  Critical Care performed: No  ____________________________________________   INITIAL IMPRESSION / ASSESSMENT AND PLAN / ED COURSE  Pertinent labs &  imaging results that were available during my care of the patient were reviewed by me and considered in my medical decision making (see chart for details).   Left lower back left lower flank pain.  Reassuring exam does have some discomfort in the left lower quadrant reproducible on exam and a "constipated" type feeling last few days associated.  Reports pain however started though after lifting a paving type stone, and clinical exam seems to have likely musculoskeletal in nature without evidence of acute neuropathy or neurologic symptoms.  However given the patient's age discomfort left lower abdomen and her symptoms of feeling a bit constipated question if this could be referred pain from other process such as something like an early diverticulitis, abscess mass tumor aneurysm etc.  Will obtain CT imaging to further evaluate, pain control with tramadol after discussing options with the patient.  Clinical Course as of Apr 21 2  Sat Apr 21, 2019  2156 Checking to see when CT will be performed.   [MQ]    Clinical Course User Index [MQ] Delman Kitten, MD    ----------------------------------------- 12:03 AM on 04/22/2019 -----------------------------------------  Reviewed CT findings with Dr. Sabra Heck earlier.  Recommend the patient pick up a lumbar compression brace from local pharmacy such as [facility] or otherwise.  Also pain control and follow-up in his clinic in 1 week.  I think this is agreeable discussed with the patient she is in agreement with plan.  Tramadol has provided moderate relief of her discomfort.  She is understanding agreeable with plan for close follow-up, follow-up with orthopedics, and careful return precautions which I discussed with her including increasing pain numbness tingling weakness in the legs or if other new concerns arise she will return to ER  ____________________________________________   FINAL CLINICAL IMPRESSION(S) / ED DIAGNOSES  Final diagnoses:  Compression fracture of T12 vertebra, initial encounter [facility])        Note:  This document was prepared using Dragon voice recognition software and may include unintentional dictation errors       Delman Kitten, MD 04/22/19 0005

## 2019-04-21 NOTE — Discharge Instructions (Signed)
No driving tonight or while taking tramadol.  Use only as prescribed  I recommend that you also purchase a lumbar compression or lumbar brace at your pharmacy for added pain relief and support.  Follow-up in about 1 week with orthopedics

## 2019-04-21 NOTE — ED Triage Notes (Addendum)
Pt arrived via POV with report of left lower back pain, states the pain started Wednesday, but got worse over the past few days, pt states it was hard to get out of bed today due to pain. Denies any injury.    Reports no issues with urinating, states she has not had a BM today.   Denies any hx of kidney problems.  Pt states the pain is worse when lying down.

## 2019-04-23 LAB — URINE CULTURE: Culture: <10000 — AB

## 2019-04-27 ENCOUNTER — Other Ambulatory Visit: Payer: Self-pay

## 2019-04-27 ENCOUNTER — Emergency Department
Admission: EM | Admit: 2019-04-27 | Discharge: 2019-04-27 | Disposition: A | Discharge status: Home / Self Care | Payer: Medicare Other | Attending: Emergency Medicine | Admitting: Emergency Medicine

## 2019-04-27 DIAGNOSIS — Z7982 Long term (current) use of aspirin: Secondary | ICD-10-CM | POA: Diagnosis not present

## 2019-04-27 DIAGNOSIS — I10 Essential (primary) hypertension: Secondary | ICD-10-CM | POA: Insufficient documentation

## 2019-04-27 DIAGNOSIS — X58XXXS Exposure to other specified factors, sequela: Secondary | ICD-10-CM | POA: Insufficient documentation

## 2019-04-27 DIAGNOSIS — S22080S Wedge compression fracture of T11-T12 vertebra, sequela: Secondary | ICD-10-CM | POA: Insufficient documentation

## 2019-04-27 DIAGNOSIS — M545 Low back pain, unspecified: Secondary | ICD-10-CM

## 2019-04-27 DIAGNOSIS — Z79899 Other long term (current) drug therapy: Secondary | ICD-10-CM | POA: Insufficient documentation

## 2019-04-27 MED ORDER — LIDOCAINE 5 % EX PTCH
1.0000 | MEDICATED_PATCH | CUTANEOUS | Status: DC
  Administered 2019-04-27: 1 via TRANSDERMAL
  Filled 2019-04-27: qty 1

## 2019-04-27 MED ORDER — LIDOCAINE 5 % EX PTCH: 1.0000 | MEDICATED_PATCH | Freq: Two times a day (BID) | CUTANEOUS | 0 refills | Status: DC

## 2019-04-27 MED ORDER — TRAMADOL HCL 50 MG PO TABS
50.0000 mg | ORAL_TABLET | Freq: Once | ORAL | Status: AC
  Administered 2019-04-27: 12:00:00 50 mg via ORAL
  Filled 2019-04-27: qty 1

## 2019-04-27 MED ORDER — LIDOCAINE 5 % EX PTCH
1.0000 | MEDICATED_PATCH | CUTANEOUS | Status: DC
  Administered 2019-04-27: 12:00:00 1 via TRANSDERMAL
  Filled 2019-04-27: qty 1

## 2019-04-27 MED ORDER — TRAMADOL HCL 50 MG PO TABS: 50.0000 mg | ORAL_TABLET | Freq: Two times a day (BID) | ORAL | 0 refills | Status: DC | PRN

## 2019-04-27 NOTE — ED Notes (Signed)
Pt to room via wheelchair, pt slow moving over to bed c/o central lower back pain off and off for several weeks, states she is taking the pain medication she was prescribed on Sunday without relief, unsure what medications she is taking. C/o pain 10/10

## 2019-04-27 NOTE — Discharge Instructions (Addendum)
A consult was sent by Dr. Kary Kos to Dr. Earnestine Leys.  Please call the number highlighted on your discharge instructions to schedule an appointment.  Continue previous medications.  Advised to purchase a lumbar compression brace from the pharmacy.

## 2019-04-27 NOTE — ED Triage Notes (Signed)
Pt to ED via [facility] from home for chief complaint of back pain that started a few weeks ago and worsened last night. States she was seen here over the weekend and it has worsened.  Denies any falls.  Pt alert and oriented x4,  Pt appears in NAD at this time.

## 2019-04-27 NOTE — ED Provider Notes (Signed)
[facility] Emergency Department Provider Note   ____________________________________________   First MD Initiated Contact with Patient 04/27/19 1155     (approximate)  I have reviewed the triage vital signs and the nursing notes.   HISTORY  Chief Complaint Back Pain    HPI Barbara Vega is a 85 y.o. female patient return to ED secondary to acute low back pain.  Patient was seen on 04/21/2019 by this department and followed up by Dr. Gorden Harms of the congenital clinic on 04/24/2019.  Dr. Gorden Harms generated consult to Dr. Earnestine Leys but patient states she has not heard from his clinic.  Patient states she is out of her pain medication was gave her moderate relief.  Patient denies radicular component to her back pain.  Patient denies bladder bowel dysfunction.  Patient rates the pain as 8/10.  Patient described the pain as "achy".         Past Medical History:  Diagnosis Date  . Adenomatous colon polyp   . Anemia    hx  . Arthritis   . Benign breast lumps   . History of colon polyps    benign  . Hypertension    takes Triamterene-HCTZ daily  . Insomnia    takes Restoril nightly  . Macular degeneration    dry  . Osteoporosis   . Sinus infection    taking Ceftin daily     Patient Active Problem List   Diagnosis Date Noted  . DDD (degenerative disc disease), lumbar 06/16/2017  . SI (sacroiliac) joint dysfunction 06/16/2017  . Incarcerated left inguinal hernia   . Small bowel obstruction ([place])   . Hyponatremia 11/20/2016  . GERD (gastroesophageal reflux disease) 09/20/2016  . Dysphagia, unspecified 08/09/2016  . Papilloma of breast 04/03/2015  . Osteoporosis, post-menopausal 03/04/2015  . Cannot sleep 03/04/2015  . BP (high blood pressure) 03/04/2015  . Adenomatous colon polyp 03/04/2015    Past Surgical History:  Procedure Laterality Date  . ABDOMINAL HYSTERECTOMY    . BREAST EXCISIONAL BIOPSY    . BREAST LUMPECTOMY Right  09/08/2015   intraductal papilloma  . BREAST LUMPECTOMY Left    2 bx done only one scar seen years ago neg  . cataract surgery Bilateral   . COLONOSCOPY    . COLONOSCOPY WITH PROPOFOL N/A 11/03/2016   Procedure: COLONOSCOPY WITH PROPOFOL;  Surgeon: Manya Silvas, MD;  Location: [facility] ENDOSCOPY;  Service: Endoscopy;  Laterality: N/A;  . ESOPHAGOGASTRODUODENOSCOPY (EGD) WITH PROPOFOL N/A 11/03/2016   Procedure: ESOPHAGOGASTRODUODENOSCOPY (EGD) WITH PROPOFOL;  Surgeon: Manya Silvas, MD;  Location: [facility] ENDOSCOPY;  Service: Endoscopy;  Laterality: N/A;  . INGUINAL HERNIA REPAIR Left 01/15/2017   Procedure: HERNIA REPAIR INGUINAL ADULT;  Surgeon: Florene Glen, MD;  Location: [facility] ORS;  Service: General;  Laterality: Left;  . RADIOACTIVE SEED GUIDED EXCISIONAL BREAST BIOPSY Right 09/08/2015   Procedure: RADIOACTIVE SEED GUIDED EXCISIONAL BREAST BIOPSY;  Surgeon: Rolm Bookbinder, MD;  Location: [place];  Service: General;  Laterality: Right;    Prior to Admission medications   Medication Sig Start Date End Date Taking? Authorizing Provider  amLODipine (NORVASC) 2.5 MG tablet Take 2.5 mg by mouth daily. 11/06/18   [provider]  aspirin EC 81 MG tablet Take 81 mg by mouth daily.    [provider]  calcium carbonate (OS-CAL - DOSED IN MG OF ELEMENTAL CALCIUM) 1250 (500 CA) MG tablet Take 1 tablet by mouth daily with breakfast.     [provider]  cholecalciferol (VITAMIN D) 1000 UNITS tablet Take 1,000 Units by mouth daily.    [provider]  conjugated estrogens (PREMARIN) vaginal cream Place 1 Applicatorful vaginally daily. Use pea sized amount M-W-Fr before bedtime 01/26/19   Hollice Espy, MD  KRILL OIL PO Take 1 tablet by mouth daily.     [provider]  lidocaine (LIDODERM) 5 % Place 1 patch onto the skin every 12 (twelve) hours. Remove & Discard patch within 12 hours or as directed by MD 04/27/19 04/26/20  Sable Feil, PA-C    temazepam (RESTORIL) 30 MG capsule Take 30 mg by mouth at bedtime.  09/13/14   [provider]  traMADol (ULTRAM) 50 MG tablet Take 1 tablet (50 mg total) by mouth every 6 (six) hours as needed for severe pain. 04/21/19   Delman Kitten, MD  traMADol (ULTRAM) 50 MG tablet Take 1 tablet (50 mg total) by mouth every 12 (twelve) hours as needed. 04/27/19   Sable Feil, PA-C    Allergies Codone [hydrocodone]  Family History  Problem Relation Age of Onset  . Osteoporosis Mother   . Heart failure Mother   . Heart block Brother   . Alcohol abuse Brother   . Bladder Cancer Neg Hx   . Kidney cancer Neg Hx     Social History Social History   Tobacco Use  . Smoking status: Never Smoker  . Smokeless tobacco: Never Used  Substance Use Topics  . Alcohol use: No    Alcohol/week: 0.0 standard drinks  . Drug use: No    Review of Systems  Constitutional: No fever/chills Eyes: No visual changes. ENT: No sore throat. Cardiovascular: Denies chest pain. Respiratory: Denies shortness of breath. Gastrointestinal: No abdominal pain.  No nausea, no vomiting.  No diarrhea.  No constipation. Genitourinary: Negative for dysuria. Musculoskeletal: Positive for back pain. Skin: Negative for rash. Neurological: Negative for headaches, focal weakness or numbness. Endocrine: Hypertension  Allergic/Immunilogical: Codeine ____________________________________________   PHYSICAL EXAM:  VITAL SIGNS: ED Triage Vitals  Enc Vitals Group     BP 04/27/19 1143 (!) 164/65     Pulse Rate 04/27/19 1143 74     Resp 04/27/19 1143 18     Temp 04/27/19 1143 98.2 F (36.8 C)     Temp Source 04/27/19 1143 Oral     SpO2 04/27/19 1143 97 %     Weight 04/27/19 1144 140 lb (63.5 kg)     Height 04/27/19 1144 5\' 2"  (1.575 m)     Head Circumference --      Peak Flow --      Pain Score 04/27/19 1143 10     Pain Loc --      Pain Edu? --      Excl. in [place]? --    Constitutional: Alert and oriented. Well  appearing and in no acute distress. Cardiovascular: Normal rate, regular rhythm. Grossly normal heart sounds.  Good peripheral circulation.  Evaded blood pressure. Respiratory: Normal respiratory effort.  No retractions. Lungs CTAB. Gastrointestinal: Soft and nontender. No distention. No abdominal bruits. No CVA tenderness. Musculoskeletal: No obvious deformity to the lumbar spine.  Patient is moderate guarding palpation L2-S1.   Neurologic:  Normal speech and language. No gross focal neurologic deficits are appreciated. No gait instability. Skin:  Skin is warm, dry and intact. No rash noted. Psychiatric: Mood and affect are normal. Speech and behavior are normal.  ____________________________________________   LABS (all labs ordered are listed, but only abnormal results are displayed)  Labs Reviewed - No data to display ____________________________________________  EKG   ____________________________________________  RADIOLOGY  ED MD interpretation:    Official radiology report(s): No results found.  ____________________________________________   PROCEDURES  Procedure(s) performed (including Critical Care):  Procedures   ____________________________________________   INITIAL IMPRESSION / ASSESSMENT AND PLAN / ED COURSE  As part of my medical decision making, I reviewed the following data within the [place]     Patient complain of low back pain secondary to strain and compression fracture.  Patient was advised to follow up with the orthopedics listed on your discharge instruction.  The numbers highlighted in yellow.  Consult was generated from her previous ED visit.  Patient also advised to purchase a lumbar compression brace from the pharmacy.  Take medication as directed.    Amethyst Runser Bokhari was evaluated in Emergency Department on 04/27/2019 for the symptoms described in the history of present illness. She was evaluated in the context of the global  COVID-19 pandemic, which necessitated consideration that the patient might be at risk for infection with the SARS-CoV-2 virus that causes COVID-19. Institutional protocols and algorithms that pertain to the evaluation of patients at risk for COVID-19 are in a state of rapid change based on information released by regulatory bodies including the CDC and federal and state organizations. These policies and algorithms were followed during the patient's care in the ED.       ____________________________________________   FINAL CLINICAL IMPRESSION(S) / ED DIAGNOSES  Final diagnoses:  Low back pain without sciatica, unspecified back pain laterality, unspecified chronicity  Compression fracture of T12 vertebra, sequela     ED Discharge Orders         Ordered    lidocaine (LIDODERM) 5 %  Every 12 hours     04/27/19 1349    traMADol (ULTRAM) 50 MG tablet  Every 12 hours PRN     04/27/19 1349           Note:  This document was prepared using Dragon voice recognition software and may include unintentional dictation errors.    Sable Feil, PA-C 04/27/19 1356    Arta Silence, MD 04/27/19 [phone]

## 2019-04-29 ENCOUNTER — Other Ambulatory Visit: Payer: Self-pay

## 2019-04-29 ENCOUNTER — Encounter: Payer: Self-pay | Admitting: Emergency Medicine

## 2019-04-29 DIAGNOSIS — M549 Dorsalgia, unspecified: Secondary | ICD-10-CM | POA: Diagnosis not present

## 2019-04-29 DIAGNOSIS — Z5321 Procedure and treatment not carried out due to patient leaving prior to being seen by health care provider: Secondary | ICD-10-CM | POA: Insufficient documentation

## 2019-04-29 NOTE — ED Triage Notes (Signed)
First Nurse Note: patient brought in by [facility] from home. Patient with complaint of back pain. Patient states that she was seen here recently for the same.

## 2019-04-29 NOTE — ED Triage Notes (Signed)
Patient with complaint of chromic left lower back pain that became worse tonight.

## 2019-04-30 ENCOUNTER — Emergency Department
Admission: EM | Admit: 2019-04-30 | Discharge: 2019-04-30 | Disposition: A | Discharge status: Home / Self Care | Payer: Medicare Other | Attending: Emergency Medicine | Admitting: Emergency Medicine

## 2019-04-30 DIAGNOSIS — Z79899 Other long term (current) drug therapy: Secondary | ICD-10-CM | POA: Insufficient documentation

## 2019-04-30 DIAGNOSIS — Z7982 Long term (current) use of aspirin: Secondary | ICD-10-CM | POA: Insufficient documentation

## 2019-04-30 DIAGNOSIS — I10 Essential (primary) hypertension: Secondary | ICD-10-CM | POA: Insufficient documentation

## 2019-04-30 DIAGNOSIS — R300 Dysuria: Secondary | ICD-10-CM | POA: Insufficient documentation

## 2019-04-30 NOTE — ED Notes (Signed)
Per [place] pt decided to leave

## 2019-05-01 ENCOUNTER — Encounter: Payer: Self-pay | Admitting: *Deleted

## 2019-05-01 ENCOUNTER — Emergency Department
Admission: EM | Admit: 2019-05-01 | Discharge: 2019-05-01 | Disposition: A | Discharge status: Home / Self Care | Payer: Medicare Other | Source: Home / Self Care | Attending: Emergency Medicine | Admitting: Emergency Medicine

## 2019-05-01 ENCOUNTER — Other Ambulatory Visit: Payer: Self-pay

## 2019-05-01 DIAGNOSIS — R300 Dysuria: Secondary | ICD-10-CM

## 2019-05-01 LAB — URINALYSIS, COMPLETE (UACMP) WITH MICROSCOPIC
Bacteria, UA: NONE SEEN
Bilirubin Urine: NEGATIVE
Glucose, UA: NEGATIVE mg/dL
Ketones, ur: 20 mg/dL — AB
Leukocytes,Ua: NEGATIVE
Nitrite: NEGATIVE
Protein, ur: NEGATIVE mg/dL
Specific Gravity, Urine: 1.013 (ref 1.005–1.030)
pH: 6 (ref 5.0–8.0)

## 2019-05-01 MED ORDER — PHENAZOPYRIDINE HCL 200 MG PO TABS: 200.0000 mg | ORAL_TABLET | Freq: Once | ORAL | Status: DC

## 2019-05-01 MED ORDER — PHENAZOPYRIDINE HCL 100 MG PO TABS
95.0000 mg | ORAL_TABLET | Freq: Once | ORAL | Status: AC
  Administered 2019-05-01: 05:00:00 100 mg via ORAL
  Filled 2019-05-01: qty 1

## 2019-05-01 MED ORDER — PHENAZOPYRIDINE HCL 100 MG PO TABS: 100.0000 mg | ORAL_TABLET | Freq: Three times a day (TID) | ORAL | 0 refills | Status: AC | PRN

## 2019-05-01 NOTE — ED Notes (Signed)
No lab work at this time per dr Alfred Levins.

## 2019-05-01 NOTE — ED Provider Notes (Signed)
[facility] Emergency Department Provider Note  ____________________________________________  Time seen: Approximately 4:54 AM  I have reviewed the triage vital signs and the nursing notes.   HISTORY  Chief Complaint Back Pain   HPI Barbara Vega is a 85 y.o. female who presents for evaluation of dysuria.  Patient reports burning with urination for the last 2 days.  She denies abdominal pain, fever, chills.  She thinks she has a UTI.  She denies flank pain.  On triage note says patient was here for back pain.  Patient denies that complaint.  She says she has been having back pain since having a compression fracture  diagnosed 10 days ago however that pain is well controlled with a prescription of Percocet that she received from her primary care doctor.  She denies any midline back pain at this time, saddle anesthesia, urinary or bowel incontinence or retention, lower extremity weakness or numbness.  Past Medical History:  Diagnosis Date  . Adenomatous colon polyp   . Anemia    hx  . Arthritis   . Benign breast lumps   . History of colon polyps    benign  . Hypertension    takes Triamterene-HCTZ daily  . Insomnia    takes Restoril nightly  . Macular degeneration    dry  . Osteoporosis   . Sinus infection    taking Ceftin daily     Patient Active Problem List   Diagnosis Date Noted  . DDD (degenerative disc disease), lumbar 06/16/2017  . SI (sacroiliac) joint dysfunction 06/16/2017  . Incarcerated left inguinal hernia   . Small bowel obstruction ([place])   . Hyponatremia 11/20/2016  . GERD (gastroesophageal reflux disease) 09/20/2016  . Dysphagia, unspecified 08/09/2016  . Papilloma of breast 04/03/2015  . Osteoporosis, post-menopausal 03/04/2015  . Cannot sleep 03/04/2015  . BP (high blood pressure) 03/04/2015  . Adenomatous colon polyp 03/04/2015    Past Surgical History:  Procedure Laterality Date  . ABDOMINAL HYSTERECTOMY    . BREAST  EXCISIONAL BIOPSY    . BREAST LUMPECTOMY Right 09/08/2015   intraductal papilloma  . BREAST LUMPECTOMY Left    2 bx done only one scar seen years ago neg  . cataract surgery Bilateral   . COLONOSCOPY    . COLONOSCOPY WITH PROPOFOL N/A 11/03/2016   Procedure: COLONOSCOPY WITH PROPOFOL;  Surgeon: Manya Silvas, MD;  Location: [facility] ENDOSCOPY;  Service: Endoscopy;  Laterality: N/A;  . ESOPHAGOGASTRODUODENOSCOPY (EGD) WITH PROPOFOL N/A 11/03/2016   Procedure: ESOPHAGOGASTRODUODENOSCOPY (EGD) WITH PROPOFOL;  Surgeon: Manya Silvas, MD;  Location[facility] ENDOSCOPY;  Service: Endoscopy;  Laterality: N/A;  . INGUINAL HERNIA REPAIR Left 01/15/2017   Procedure: HERNIA REPAIR INGUINAL ADULT;  Surgeon: Florene Glen, MD;  Location: [facility] ORS;  Service: General;  Laterality: Left;  . RADIOACTIVE SEED GUIDED EXCISIONAL BREAST BIOPSY Right 09/08/2015   Procedure: RADIOACTIVE SEED GUIDED EXCISIONAL BREAST BIOPSY;  Surgeon: Rolm Bookbinder, MD;  Location: [place];  Service: General;  Laterality: Right;    Prior to Admission medications   Medication Sig Start Date End Date Taking? Authorizing Provider  amLODipine (NORVASC) 2.5 MG tablet Take 2.5 mg by mouth daily. 11/06/18   [provider]  aspirin EC 81 MG tablet Take 81 mg by mouth daily.    [provider]  calcium carbonate (OS-CAL - DOSED IN MG OF ELEMENTAL CALCIUM) 1250 (500 CA) MG tablet Take 1 tablet by mouth daily with breakfast.     [provider]  cholecalciferol (VITAMIN D) 1000 UNITS tablet Take 1,000 Units by mouth daily.    [provider]  conjugated estrogens (PREMARIN) vaginal cream Place 1 Applicatorful vaginally daily. Use pea sized amount M-W-Fr before bedtime 01/26/19   Hollice Espy, MD  KRILL OIL PO Take 1 tablet by mouth daily.     [provider]  lidocaine (LIDODERM) 5 % Place 1 patch onto the skin every 12 (twelve) hours. Remove & Discard patch within 12 hours or as directed by MD  04/27/19 04/26/20  Sable Feil, PA-C  phenazopyridine (PYRIDIUM) 100 MG tablet Take 1 tablet (100 mg total) by mouth 3 (three) times daily as needed for pain. 05/01/19 04/30/20  Rudene Re, MD  temazepam (RESTORIL) 30 MG capsule Take 30 mg by mouth at bedtime.  09/13/14   [provider]  traMADol (ULTRAM) 50 MG tablet Take 1 tablet (50 mg total) by mouth every 6 (six) hours as needed for severe pain. 04/21/19   Delman Kitten, MD  traMADol (ULTRAM) 50 MG tablet Take 1 tablet (50 mg total) by mouth every 12 (twelve) hours as needed. 04/27/19   Sable Feil, PA-C    Allergies Codone [hydrocodone]  Family History  Problem Relation Age of Onset  . Osteoporosis Mother   . Heart failure Mother   . Heart block Brother   . Alcohol abuse Brother   . Bladder Cancer Neg Hx   . Kidney cancer Neg Hx     Social History Social History   Tobacco Use  . Smoking status: Never Smoker  . Smokeless tobacco: Never Used  Substance Use Topics  . Alcohol use: No    Alcohol/week: 0.0 standard drinks  . Drug use: No    Review of Systems  Constitutional: Negative for fever. Eyes: Negative for visual changes. ENT: Negative for sore throat. Neck: No neck pain  Cardiovascular: Negative for chest pain. Respiratory: Negative for shortness of breath. Gastrointestinal: Negative for abdominal pain, vomiting or diarrhea. Genitourinary: + dysuria. Musculoskeletal: Negative for back pain. Skin: Negative for rash. Neurological: Negative for headaches, weakness or numbness. Psych: No SI or HI  ____________________________________________   PHYSICAL EXAM:  VITAL SIGNS: ED Triage Vitals [05/01/19 0028]  Enc Vitals Group     BP (!) 167/67     Pulse Rate 75     Resp 20     Temp 99.1 F (37.3 C)     Temp Source Oral     SpO2 96 %     Weight 143 lb (64.9 kg)     Height 5\' 2"  (1.575 m)     Head Circumference      Peak Flow      Pain Score 10     Pain Loc      Pain Edu?       Excl. in [place]?     Constitutional: Alert and oriented. Well appearing and in no apparent distress. HEENT:      Head: Normocephalic and atraumatic.         Eyes: Conjunctivae are normal. Sclera is non-icteric.       Mouth/Throat: Mucous membranes are moist.       Neck: Supple with no signs of meningismus. Cardiovascular: Regular rate and rhythm. No murmurs, gallops, or rubs. 2+ symmetrical distal pulses are present in all extremities. No JVD. Respiratory: Normal respiratory effort. Lungs are clear to auscultation bilaterally. No wheezes, crackles, or rhonchi.  Gastrointestinal: Soft, non tender, and non distended with positive bowel sounds. No rebound or  guarding. Genitourinary: No CVA tenderness. Normal vaginal exam with no erythema, no discharge Musculoskeletal: Nontender with normal range of motion in all extremities. No edema, cyanosis, or erythema of extremities. Neurologic: Normal speech and language. Face is symmetric. Moving all extremities. No gross focal neurologic deficits are appreciated. Skin: Skin is warm, dry and intact. No rash noted. Psychiatric: Mood and affect are normal. Speech and behavior are normal.  ____________________________________________   LABS (all labs ordered are listed, but only abnormal results are displayed)  Labs Reviewed  URINALYSIS, COMPLETE (UACMP) WITH MICROSCOPIC - Abnormal; Notable for the following components:      Result Value   Color, Urine YELLOW (*)    APPearance CLEAR (*)    Hgb urine dipstick SMALL (*)    Ketones, ur 20 (*)    All other components within normal limits  URINE CULTURE   ____________________________________________  EKG  none  ____________________________________________  RADIOLOGY  none  ____________________________________________   PROCEDURES  Procedure(s) performed: None Procedures Critical Care performed:  None ____________________________________________   INITIAL IMPRESSION / ASSESSMENT AND PLAN  / ED COURSE  85 y.o. female who presents for evaluation of dysuria x 2 days.  UA negative for UTI.  Vaginal exam showed no evidence of yeast infection and otherwise normal.  Will give Pyridium for discomfort.  Will send urine for culture.  Patient denies any other complaints at this time.  She does have a history of compression fracture diagnosed 10 days ago.  Her neurological exam is intact with intact strength and sensation of her extremities, normal DTRs, and no signs of cauda equina.  Discussed follow-up with PCP and my standard return precautions.       As part of my medical decision making, I reviewed the following data within the [place] notes reviewed and incorporated, Labs reviewed , Old chart reviewed, Notes from prior ED visits and [place] Controlled Substance Database   Please note:  Patient was evaluated in Emergency Department today for the symptoms described in the history of present illness. Patient was evaluated in the context of the global COVID-19 pandemic, which necessitated consideration that the patient might be at risk for infection with the SARS-CoV-2 virus that causes COVID-19. Institutional protocols and algorithms that pertain to the evaluation of patients at risk for COVID-19 are in a state of rapid change based on information released by regulatory bodies including the CDC and federal and state organizations. These policies and algorithms were followed during the patient's care in the ED.  Some ED evaluations and interventions may be delayed as a result of limited staffing during the pandemic.   ____________________________________________   FINAL CLINICAL IMPRESSION(S) / ED DIAGNOSES   Final diagnoses:  Dysuria      NEW MEDICATIONS STARTED DURING THIS VISIT:  ED Discharge Orders         Ordered    phenazopyridine (PYRIDIUM) 100 MG tablet  3 times daily PRN     05/01/19 0501           Note:  This document was prepared using  Dragon voice recognition software and may include unintentional dictation errors.    Alfred Levins, [place], MD 05/01/19 [phone]

## 2019-05-01 NOTE — ED Triage Notes (Signed)
Pt has low back pain.  Pt seen here recently for same.  Pt has not seen her pmd since ER visits.pt states pain meds not working.  Pt alert.

## 2019-05-02 LAB — URINE CULTURE: Culture: <10000 — AB

## 2019-05-07 ENCOUNTER — Ambulatory Visit
Admission: RE | Admit: 2019-05-07 | Discharge: 2019-05-07 | Disposition: A | Discharge status: Home / Self Care | Payer: Medicare Other | Source: Ambulatory Visit | Attending: Physical Medicine and Rehabilitation | Admitting: Physical Medicine and Rehabilitation

## 2019-05-07 ENCOUNTER — Other Ambulatory Visit: Payer: Self-pay | Admitting: Physical Medicine and Rehabilitation

## 2019-05-07 ENCOUNTER — Other Ambulatory Visit: Payer: Self-pay

## 2019-05-07 DIAGNOSIS — S22080G Wedge compression fracture of T11-T12 vertebra, subsequent encounter for fracture with delayed healing: Secondary | ICD-10-CM

## 2019-05-09 ENCOUNTER — Other Ambulatory Visit: Payer: Self-pay | Admitting: Orthopedic Surgery

## 2019-05-10 ENCOUNTER — Other Ambulatory Visit
Admission: RE | Admit: 2019-05-10 | Discharge: 2019-05-10 | Disposition: A | Discharge status: Home / Self Care | Payer: Medicare Other | Source: Ambulatory Visit | Attending: Orthopedic Surgery | Admitting: Orthopedic Surgery

## 2019-05-10 DIAGNOSIS — Z20828 Contact with and (suspected) exposure to other viral communicable diseases: Secondary | ICD-10-CM | POA: Insufficient documentation

## 2019-05-10 DIAGNOSIS — Z01812 Encounter for preprocedural laboratory examination: Secondary | ICD-10-CM | POA: Insufficient documentation

## 2019-05-10 LAB — SARS CORONAVIRUS 2 (TAT 6-24 HRS): SARS Coronavirus 2: NEGATIVE

## 2019-05-14 MED ORDER — CEFAZOLIN SODIUM-DEXTROSE 2-4 GM/100ML-% IV SOLN
2.0000 g | INTRAVENOUS | Status: AC
  Administered 2019-05-15: 2 g via INTRAVENOUS

## 2019-05-15 ENCOUNTER — Ambulatory Visit
Admission: RE | Admit: 2019-05-15 | Discharge: 2019-05-15 | Disposition: A | Discharge status: Home / Self Care | Payer: Medicare Other | Attending: Orthopedic Surgery | Admitting: Orthopedic Surgery

## 2019-05-15 ENCOUNTER — Other Ambulatory Visit: Payer: Self-pay

## 2019-05-15 ENCOUNTER — Ambulatory Visit: Payer: Medicare Other

## 2019-05-15 ENCOUNTER — Ambulatory Visit: Payer: Medicare Other | Admitting: Anesthesiology

## 2019-05-15 ENCOUNTER — Encounter
Admission: RE | Disposition: A | Discharge status: Home / Self Care | Payer: Self-pay | Source: Home / Self Care | Attending: Orthopedic Surgery

## 2019-05-15 ENCOUNTER — Encounter: Payer: Self-pay | Admitting: Orthopedic Surgery

## 2019-05-15 DIAGNOSIS — E119 Type 2 diabetes mellitus without complications: Secondary | ICD-10-CM | POA: Insufficient documentation

## 2019-05-15 DIAGNOSIS — M5136 Other intervertebral disc degeneration, lumbar region: Secondary | ICD-10-CM | POA: Insufficient documentation

## 2019-05-15 DIAGNOSIS — Z7982 Long term (current) use of aspirin: Secondary | ICD-10-CM | POA: Insufficient documentation

## 2019-05-15 DIAGNOSIS — Z79890 Hormone replacement therapy (postmenopausal): Secondary | ICD-10-CM | POA: Diagnosis not present

## 2019-05-15 DIAGNOSIS — Z79899 Other long term (current) drug therapy: Secondary | ICD-10-CM | POA: Diagnosis not present

## 2019-05-15 DIAGNOSIS — Z6826 Body mass index (BMI) 26.0-26.9, adult: Secondary | ICD-10-CM | POA: Diagnosis not present

## 2019-05-15 DIAGNOSIS — X58XXXA Exposure to other specified factors, initial encounter: Secondary | ICD-10-CM | POA: Diagnosis not present

## 2019-05-15 DIAGNOSIS — K219 Gastro-esophageal reflux disease without esophagitis: Secondary | ICD-10-CM | POA: Insufficient documentation

## 2019-05-15 DIAGNOSIS — I10 Essential (primary) hypertension: Secondary | ICD-10-CM | POA: Insufficient documentation

## 2019-05-15 DIAGNOSIS — S22080A Wedge compression fracture of T11-T12 vertebra, initial encounter for closed fracture: Secondary | ICD-10-CM | POA: Insufficient documentation

## 2019-05-15 DIAGNOSIS — G4700 Insomnia, unspecified: Secondary | ICD-10-CM | POA: Diagnosis not present

## 2019-05-15 DIAGNOSIS — Z419 Encounter for procedure for purposes other than remedying health state, unspecified: Secondary | ICD-10-CM

## 2019-05-15 DIAGNOSIS — Z7951 Long term (current) use of inhaled steroids: Secondary | ICD-10-CM | POA: Diagnosis not present

## 2019-05-15 DIAGNOSIS — M810 Age-related osteoporosis without current pathological fracture: Secondary | ICD-10-CM | POA: Diagnosis not present

## 2019-05-15 DIAGNOSIS — E669 Obesity, unspecified: Secondary | ICD-10-CM | POA: Insufficient documentation

## 2019-05-15 HISTORY — PX: KYPHOPLASTY: SHX5884

## 2019-05-15 SURGERY — KYPHOPLASTY
Anesthesia: General | Site: Spine Thoracic

## 2019-05-15 MED ORDER — BUPIVACAINE-EPINEPHRINE (PF) 0.5% -1:200000 IJ SOLN
INTRAMUSCULAR | Status: DC | PRN
  Administered 2019-05-15: 20 mL

## 2019-05-15 MED ORDER — CEFAZOLIN SODIUM-DEXTROSE 2-4 GM/100ML-% IV SOLN
INTRAVENOUS | Status: AC
  Filled 2019-05-15: qty 100

## 2019-05-15 MED ORDER — LIDOCAINE HCL (PF) 2 % IJ SOLN
INTRAMUSCULAR | Status: AC
  Filled 2019-05-15: qty 10

## 2019-05-15 MED ORDER — KETAMINE HCL 50 MG/ML IJ SOLN
INTRAMUSCULAR | Status: DC | PRN
  Administered 2019-05-15: 15 mg via INTRAVENOUS
  Administered 2019-05-15: 10 mg via INTRAVENOUS

## 2019-05-15 MED ORDER — LIDOCAINE HCL (PF) 1 % IJ SOLN
INTRAMUSCULAR | Status: AC
  Filled 2019-05-15: qty 30

## 2019-05-15 MED ORDER — FAMOTIDINE 20 MG PO TABS
20.0000 mg | ORAL_TABLET | Freq: Once | ORAL | Status: AC
  Administered 2019-05-15: 20 mg via ORAL

## 2019-05-15 MED ORDER — SODIUM CHLORIDE 0.9 % IV SOLN: INTRAVENOUS | Status: DC

## 2019-05-15 MED ORDER — TRAMADOL HCL 50 MG PO TABS
50.0000 mg | ORAL_TABLET | Freq: Once | ORAL | Status: AC
  Filled 2019-05-15: qty 1

## 2019-05-15 MED ORDER — TRAMADOL HCL 50 MG PO TABS
ORAL_TABLET | ORAL | Status: AC
  Administered 2019-05-15: 50 mg via ORAL
  Filled 2019-05-15: qty 1

## 2019-05-15 MED ORDER — PROPOFOL 10 MG/ML IV BOLUS
INTRAVENOUS | Status: DC | PRN
  Administered 2019-05-15 (×3): 20 mg via INTRAVENOUS

## 2019-05-15 MED ORDER — DEXMEDETOMIDINE HCL 200 MCG/2ML IV SOLN
INTRAVENOUS | Status: DC | PRN
  Administered 2019-05-15 (×2): 8 ug via INTRAVENOUS

## 2019-05-15 MED ORDER — FENTANYL CITRATE (PF) 100 MCG/2ML IJ SOLN
25.0000 ug | INTRAMUSCULAR | Status: DC | PRN
  Administered 2019-05-15 (×2): 25 ug via INTRAVENOUS

## 2019-05-15 MED ORDER — CHLORHEXIDINE GLUCONATE 4 % EX LIQD
60.0000 mL | Freq: Once | CUTANEOUS | Status: AC
  Administered 2019-05-15: 4 via TOPICAL

## 2019-05-15 MED ORDER — EPINEPHRINE PF 1 MG/ML IJ SOLN
INTRAMUSCULAR | Status: AC
  Filled 2019-05-15: qty 1

## 2019-05-15 MED ORDER — METOCLOPRAMIDE HCL 5 MG/ML IJ SOLN: 5.0000 mg | Freq: Three times a day (TID) | INTRAMUSCULAR | Status: DC | PRN

## 2019-05-15 MED ORDER — LIDOCAINE HCL 1 % IJ SOLN
INTRAMUSCULAR | Status: DC | PRN
  Administered 2019-05-15: 30 mL

## 2019-05-15 MED ORDER — FENTANYL CITRATE (PF) 100 MCG/2ML IJ SOLN
INTRAMUSCULAR | Status: AC
  Administered 2019-05-15: 50 ug via INTRAVENOUS
  Filled 2019-05-15: qty 2

## 2019-05-15 MED ORDER — BUPIVACAINE HCL (PF) 0.5 % IJ SOLN
INTRAMUSCULAR | Status: AC
  Filled 2019-05-15: qty 30

## 2019-05-15 MED ORDER — METOCLOPRAMIDE HCL 10 MG PO TABS: 5.0000 mg | ORAL_TABLET | Freq: Three times a day (TID) | ORAL | Status: DC | PRN

## 2019-05-15 MED ORDER — DEXMEDETOMIDINE HCL IN NACL 80 MCG/20ML IV SOLN
INTRAVENOUS | Status: AC
  Filled 2019-05-15: qty 20

## 2019-05-15 MED ORDER — PROPOFOL 10 MG/ML IV BOLUS
INTRAVENOUS | Status: AC
  Filled 2019-05-15: qty 20

## 2019-05-15 MED ORDER — ONDANSETRON HCL 4 MG/2ML IJ SOLN: 4.0000 mg | Freq: Four times a day (QID) | INTRAMUSCULAR | Status: DC | PRN

## 2019-05-15 MED ORDER — LACTATED RINGERS IV SOLN: INTRAVENOUS | Status: DC

## 2019-05-15 MED ORDER — FAMOTIDINE 20 MG PO TABS
ORAL_TABLET | ORAL | Status: AC
  Filled 2019-05-15: qty 1

## 2019-05-15 MED ORDER — ONDANSETRON HCL 4 MG/2ML IJ SOLN: 4.0000 mg | Freq: Once | INTRAMUSCULAR | Status: DC | PRN

## 2019-05-15 MED ORDER — LIDOCAINE 2% (20 MG/ML) 5 ML SYRINGE
INTRAMUSCULAR | Status: DC | PRN
  Administered 2019-05-15: 50 mg via INTRAVENOUS

## 2019-05-15 MED ORDER — PROPOFOL 500 MG/50ML IV EMUL
INTRAVENOUS | Status: DC | PRN
  Administered 2019-05-15: 25 ug/kg/min via INTRAVENOUS

## 2019-05-15 MED ORDER — IOHEXOL 180 MG/ML  SOLN
INTRAMUSCULAR | Status: DC | PRN
  Administered 2019-05-15: 14:00:00 20 mL

## 2019-05-15 MED ORDER — TRAMADOL HCL 50 MG PO TABS: 50.0000 mg | ORAL_TABLET | Freq: Four times a day (QID) | ORAL | 0 refills | Status: DC | PRN

## 2019-05-15 MED ORDER — ONDANSETRON HCL 4 MG PO TABS: 4.0000 mg | ORAL_TABLET | Freq: Four times a day (QID) | ORAL | Status: DC | PRN

## 2019-05-15 SURGICAL SUPPLY — 19 items
CEMENT KYPHON CX01A KIT/MIXER (Cement) ×3 IMPLANT
COVER WAND RF STERILE (DRAPES) ×3 IMPLANT
DERMABOND ADVANCED (GAUZE/BANDAGES/DRESSINGS) ×2
DERMABOND ADVANCED .7 DNX12 (GAUZE/BANDAGES/DRESSINGS) ×1 IMPLANT
DEVICE BIOPSY BONE KYPHX (INSTRUMENTS) ×3 IMPLANT
DRAPE C-ARM XRAY 36X54 (DRAPES) ×3 IMPLANT
DURAPREP 26ML APPLICATOR (WOUND CARE) ×3 IMPLANT
GLOVE SURG SYN 9.0  PF PI (GLOVE) ×2
GLOVE SURG SYN 9.0 PF PI (GLOVE) ×1 IMPLANT
GOWN SRG 2XL LVL 4 RGLN SLV (GOWNS) ×1 IMPLANT
GOWN STRL NON-REIN 2XL LVL4 (GOWNS) ×2
GOWN STRL REUS W/ TWL LRG LVL3 (GOWN DISPOSABLE) ×1 IMPLANT
GOWN STRL REUS W/TWL LRG LVL3 (GOWN DISPOSABLE) ×2
PACK KYPHOPLASTY (MISCELLANEOUS) ×3 IMPLANT
RENTAL RFA  GENERATOR (MISCELLANEOUS)
RENTAL RFA GENERATOR (MISCELLANEOUS) IMPLANT
STRAP SAFETY 5IN WIDE (MISCELLANEOUS) ×3 IMPLANT
TRAY KYPHOPAK 15/3 EXPRESS 1ST (MISCELLANEOUS) ×3 IMPLANT
TRAY KYPHOPAK 20/3 EXPRESS 1ST (MISCELLANEOUS) IMPLANT

## 2019-05-15 NOTE — OR Nursing (Signed)
Pt taken to OR by CRNA prior to checking for marking by surgeon.

## 2019-05-15 NOTE — OR Nursing (Signed)
Pt dentures and two rings given to daughter at bedside during pre-op

## 2019-05-15 NOTE — Op Note (Signed)
Date 05/15/2019  time 1:50 PM   PATIENT:  Barbara Vega   PRE-OPERATIVE DIAGNOSIS:  closed wedge compression fracture of T12   POST-OPERATIVE DIAGNOSIS:  closed wedge compression fracture of T12   PROCEDURE:  Procedure(s): KYPHOPLASTY T12  SURGEON: Laurene Footman, MD   ASSISTANTS: None   ANESTHESIA:   local and MAC   EBL:  No intake/output data recorded.   BLOOD ADMINISTERED:none   DRAINS: none    LOCAL MEDICATIONS USED:  MARCAINE    and XYLOCAINE    SPECIMEN:   T12 vertebral body biopsy   DISPOSITION OF SPECIMEN:  Pathology   COUNTS:  YES   TOURNIQUET:  * No tourniquets in log *   IMPLANTS: Bone cement   DICTATION: .Dragon Dictation  patient was brought to the operating room and after adequate anesthesia was obtained the patient was placed prone.  C arm was brought in in good visualization of the affected level obtained on both AP and lateral projections.  After patient identification and timeout procedures were completed, local anesthetic was infiltrated with 10 cc 1% Xylocaine infiltrated subcutaneously.  This is done the area on the each side of the planned approach.  The back was then prepped and draped in the usual sterile manner and repeat timeout procedure carried out.  A spinal needle was brought down to the pedicle on the each side of  T12 and a 50-50 mix of 1% Xylocaine half percent Sensorcaine with epinephrine total of 20 cc injected.  After allowing this to set a small incision was made on the right side and the trocar was advanced into the vertebral body in an extrapedicular fashion.  Biopsy was obtained Drilling was carried out and across the midline so only one side stick was required.  Balloon inserted with inflation to  4-1/2 cc.  When the cement was appropriate consistency 6 cc were injected into the vertebral body without extravasation, good fill superior to inferior endplates and from right to left sides along the inferior endplate.  After the cement had set  the trochar was removed and permanent C-arm views obtained.  The wound was closed with Dermabond followed by Band-Aid   PLAN OF CARE: Discharge to home after PACU   PATIENT DISPOSITION:  PACU - hemodynamically stable.

## 2019-05-15 NOTE — Transfer of Care (Signed)
Immediate Anesthesia Transfer of Care Note  Patient: Barbara Vega  Procedure(s) Performed: KYPHOPLASTY T12 (N/A Spine Thoracic)  Patient Location: PACU  Anesthesia Type:General  Level of Consciousness: awake and alert   Airway & Oxygen Therapy: Patient Spontanous Breathing  Post-op Assessment: Report given to RN and Post -op Vital signs reviewed and stable  Post vital signs: Reviewed  Last Vitals:  Vitals Value Taken Time  BP 128/58 05/15/19 1353  Temp    Pulse 85 05/15/19 1353  Resp 21 05/15/19 1353  SpO2 97 % 05/15/19 1353  Vitals shown include unvalidated device data.  Last Pain:  Vitals:   05/15/19 1038  TempSrc: Tympanic  PainSc: 10-Worst pain ever         Complications: No apparent anesthesia complications

## 2019-05-15 NOTE — H&P (Signed)
Reviewed paper H+P, will be scanned into chart. No changes noted.  

## 2019-05-15 NOTE — Anesthesia Postprocedure Evaluation (Signed)
Anesthesia Post Note  Patient: Barbara Vega  Procedure(s) Performed: KYPHOPLASTY T12 (N/A Spine Thoracic)  Patient location during evaluation: PACU Anesthesia Type: General Level of consciousness: awake and alert and oriented Pain management: pain level controlled Vital Signs Assessment: post-procedure vital signs reviewed and stable Respiratory status: spontaneous breathing, nonlabored ventilation and respiratory function stable Cardiovascular status: blood pressure returned to baseline and stable Postop Assessment: no signs of nausea or vomiting Anesthetic complications: no     Last Vitals:  Vitals:   05/15/19 1408 05/15/19 1423  BP: 131/60 135/60  Pulse: 78 82  Resp: 17 18  Temp:    SpO2: 96% 95%    Last Pain:  Vitals:   05/15/19 1428  TempSrc:   PainSc: 7                  Roddrick Sharron

## 2019-05-15 NOTE — Anesthesia Post-op Follow-up Note (Signed)
Anesthesia QCDR form completed.        

## 2019-05-15 NOTE — OR Nursing (Signed)
This RN spoke with Dr. Randa Lynn about pt acute pain. Verbal orders to given tramadol 50mg , which pt takes at home for pain with relief per daughter.

## 2019-05-15 NOTE — Discharge Instructions (Addendum)
Take it easy today and tomorrow and avoid lifting anything over 5 pounds for 2 weeks.  Band-Aid can come off Thursday and then okay to shower.  Tramadol has been sent to your pharmacy and is to be taken as directed if needed.  Call office if you are having increased pain.   AMBULATORY SURGERY  DISCHARGE INSTRUCTIONS   1) The drugs that you were given will stay in your system until tomorrow so for the next 24 hours you should not:  A) Drive an automobile B) Make any legal decisions C) Drink any alcoholic beverage   2) You may resume regular meals tomorrow.  Today it is better to start with liquids and gradually work up to solid foods.  You may eat anything you prefer, but it is better to start with liquids, then soup and crackers, and gradually work up to solid foods.   3) Please notify your doctor immediately if you have any unusual bleeding, trouble breathing, redness and pain at the surgery site, drainage, fever, or pain not relieved by medication.    4) Additional Instructions:        Please contact your physician with any problems or Same Day Surgery at [phone], Monday through Friday 6 am to 4 pm, or [place] at [facility] number at [phone].

## 2019-05-15 NOTE — Anesthesia Preprocedure Evaluation (Signed)
Anesthesia Evaluation  Patient identified by MRN, date of birth, ID band Patient awake    Reviewed: Allergy & Precautions, NPO status , Patient's Chart, lab work & pertinent test results  History of Anesthesia Complications Negative for: history of anesthetic complications  Airway Mallampati: II  TM Distance: >3 FB Neck ROM: Full    Dental  (+) Poor Dentition, Missing   Pulmonary neg pulmonary ROS, neg sleep apnea, neg COPD,    breath sounds clear to auscultation- rhonchi (-) wheezing      Cardiovascular Exercise Tolerance: Good hypertension, Pt. on medications (-) CAD, (-) Past MI, (-) Cardiac Stents and (-) CABG  Rhythm:Regular Rate:Normal - Systolic murmurs and - Diastolic murmurs    Neuro/Psych neg Seizures negative neurological ROS  negative psych ROS   GI/Hepatic Neg liver ROS, GERD  ,  Endo/Other  negative endocrine ROSneg diabetes  Renal/GU negative Renal ROS     Musculoskeletal  (+) Arthritis ,   Abdominal (+) - obese,   Peds  Hematology  (+) anemia ,   Anesthesia Other Findings Past Medical History: No date: Adenomatous colon polyp No date: Anemia     Comment:  hx No date: Arthritis No date: Benign breast lumps No date: History of colon polyps     Comment:  benign No date: Hypertension     Comment:  takes Triamterene-HCTZ daily No date: Insomnia     Comment:  takes Restoril nightly No date: Macular degeneration     Comment:  dry No date: Osteoporosis No date: Sinus infection     Comment:  taking Ceftin daily    Reproductive/Obstetrics                             Anesthesia Physical Anesthesia Plan  ASA: II  Anesthesia Plan: General   Post-op Pain Management:    Induction: Intravenous  PONV Risk Score and Plan: 2 and Propofol infusion  Airway Management Planned: Natural Airway  Additional Equipment:   Intra-op Plan:   Post-operative Plan:   Informed  Consent: I have reviewed the patients History and Physical, chart, labs and discussed the procedure including the risks, benefits and alternatives for the proposed anesthesia with the patient or authorized representative who has indicated his/her understanding and acceptance.     Dental advisory given  Plan Discussed with: CRNA and Anesthesiologist  Anesthesia Plan Comments:         Anesthesia Quick Evaluation

## 2019-05-16 LAB — SURGICAL PATHOLOGY

## 2019-05-17 ENCOUNTER — Other Ambulatory Visit: Payer: Self-pay

## 2019-05-17 ENCOUNTER — Emergency Department
Admission: EM | Admit: 2019-05-17 | Discharge: 2019-05-17 | Disposition: A | Discharge status: Home / Self Care | Payer: Medicare Other | Attending: Emergency Medicine | Admitting: Emergency Medicine

## 2019-05-17 DIAGNOSIS — Z5321 Procedure and treatment not carried out due to patient leaving prior to being seen by health care provider: Secondary | ICD-10-CM | POA: Insufficient documentation

## 2019-05-17 DIAGNOSIS — R112 Nausea with vomiting, unspecified: Secondary | ICD-10-CM | POA: Diagnosis present

## 2019-05-17 LAB — URINALYSIS, COMPLETE (UACMP) WITH MICROSCOPIC
Bilirubin Urine: NEGATIVE
Glucose, UA: NEGATIVE mg/dL
Ketones, ur: 80 mg/dL — AB
Leukocytes,Ua: NEGATIVE
Nitrite: NEGATIVE
Protein, ur: 100 mg/dL — AB
Specific Gravity, Urine: 1.023 (ref 1.005–1.030)
pH: 6 (ref 5.0–8.0)

## 2019-05-17 LAB — COMPREHENSIVE METABOLIC PANEL
ALT: 10 U/L (ref 0–44)
AST: 18 U/L (ref 15–41)
Albumin: 4 g/dL (ref 3.5–5.0)
Alkaline Phosphatase: 98 U/L (ref 38–126)
Anion gap: 12 (ref 5–15)
BUN: 8 mg/dL (ref 8–23)
CO2: 24 mmol/L (ref 22–32)
Calcium: 9.7 mg/dL (ref 8.9–10.3)
Chloride: 100 mmol/L (ref 98–111)
Creatinine, Ser: 0.49 mg/dL (ref 0.44–1.00)
GFR calc Af Amer: >60 mL/min (ref >60)
GFR calc non Af Amer: >60 mL/min (ref >60)
Glucose, Bld: 132 mg/dL — ABNORMAL HIGH (ref 70–99)
Potassium: 2.9 mmol/L — ABNORMAL LOW (ref 3.5–5.1)
Sodium: 136 mmol/L (ref 135–145)
Total Bilirubin: 1 mg/dL (ref 0.3–1.2)
Total Protein: 7.1 g/dL (ref 6.5–8.1)

## 2019-05-17 LAB — CBC
HCT: 45.6 % (ref 36.0–46.0)
Hemoglobin: 16 g/dL — ABNORMAL HIGH (ref 12.0–15.0)
MCH: 29.5 pg (ref 26.0–34.0)
MCHC: 35.1 g/dL (ref 30.0–36.0)
MCV: 84.1 fL (ref 80.0–100.0)
Platelets: 456 10*3/uL — ABNORMAL HIGH (ref 150–400)
RBC: 5.42 MIL/uL — ABNORMAL HIGH (ref 3.87–5.11)
RDW: 12.9 % (ref 11.5–15.5)
WBC: 10 10*3/uL (ref 4.0–10.5)
nRBC: 0 % (ref 0.0–0.2)

## 2019-05-17 LAB — LIPASE, BLOOD: Lipase: 25 U/L (ref 11–51)

## 2019-05-17 MED ORDER — SODIUM CHLORIDE 0.9% FLUSH: 3.0000 mL | Freq: Once | INTRAVENOUS | Status: DC

## 2019-05-17 MED ORDER — ONDANSETRON 4 MG PO TBDP
4.0000 mg | ORAL_TABLET | Freq: Once | ORAL | Status: AC | PRN
  Administered 2019-05-17: 4 mg via ORAL
  Filled 2019-05-17: qty 1

## 2019-05-17 NOTE — ED Triage Notes (Signed)
Pt to the er via ems for n/v since Monday. VSS, no fever, Alert and oriented x 4, Pt states she has been unable to hold anything down. Pt began taking tramadol on Monday. Pt says she is unable to sleep at night.

## 2019-05-29 ENCOUNTER — Other Ambulatory Visit: Payer: Self-pay | Admitting: Orthopedic Surgery

## 2019-05-29 ENCOUNTER — Ambulatory Visit
Admission: RE | Admit: 2019-05-29 | Discharge: 2019-05-29 | Disposition: A | Discharge status: Home / Self Care | Payer: Medicare Other | Source: Ambulatory Visit | Attending: Orthopedic Surgery | Admitting: Orthopedic Surgery

## 2019-05-29 ENCOUNTER — Other Ambulatory Visit: Payer: Self-pay

## 2019-05-29 DIAGNOSIS — S32030A Wedge compression fracture of third lumbar vertebra, initial encounter for closed fracture: Secondary | ICD-10-CM | POA: Insufficient documentation

## 2019-05-30 ENCOUNTER — Other Ambulatory Visit: Payer: Self-pay | Admitting: Orthopedic Surgery

## 2019-05-30 ENCOUNTER — Other Ambulatory Visit
Admission: RE | Admit: 2019-05-30 | Discharge: 2019-05-30 | Disposition: A | Discharge status: Home / Self Care | Payer: Medicare Other | Source: Ambulatory Visit | Attending: Orthopedic Surgery | Admitting: Orthopedic Surgery

## 2019-05-30 DIAGNOSIS — Z01812 Encounter for preprocedural laboratory examination: Secondary | ICD-10-CM | POA: Insufficient documentation

## 2019-05-30 DIAGNOSIS — Z20822 Contact with and (suspected) exposure to covid-19: Secondary | ICD-10-CM | POA: Insufficient documentation

## 2019-05-31 LAB — SARS CORONAVIRUS 2 (TAT 6-24 HRS): SARS Coronavirus 2: NEGATIVE

## 2019-05-31 MED ORDER — CEFAZOLIN SODIUM-DEXTROSE 2-4 GM/100ML-% IV SOLN
2.0000 g | INTRAVENOUS | Status: AC
  Administered 2019-06-01: 12:00:00 2 g via INTRAVENOUS

## 2019-06-01 ENCOUNTER — Other Ambulatory Visit: Payer: Self-pay

## 2019-06-01 ENCOUNTER — Encounter: Payer: Self-pay | Admitting: Orthopedic Surgery

## 2019-06-01 ENCOUNTER — Ambulatory Visit: Payer: Medicare Other | Admitting: Certified Registered Nurse Anesthetist

## 2019-06-01 ENCOUNTER — Ambulatory Visit
Admission: RE | Admit: 2019-06-01 | Discharge: 2019-06-02 | Disposition: A | Discharge status: Home / Self Care | Payer: Medicare Other | Attending: Orthopedic Surgery | Admitting: Orthopedic Surgery

## 2019-06-01 ENCOUNTER — Ambulatory Visit: Payer: Medicare Other

## 2019-06-01 ENCOUNTER — Encounter
Admission: RE | Disposition: A | Discharge status: Home / Self Care | Payer: Self-pay | Source: Home / Self Care | Attending: Orthopedic Surgery

## 2019-06-01 DIAGNOSIS — Z7982 Long term (current) use of aspirin: Secondary | ICD-10-CM | POA: Insufficient documentation

## 2019-06-01 DIAGNOSIS — G4700 Insomnia, unspecified: Secondary | ICD-10-CM | POA: Insufficient documentation

## 2019-06-01 DIAGNOSIS — H3530 Unspecified macular degeneration: Secondary | ICD-10-CM | POA: Diagnosis not present

## 2019-06-01 DIAGNOSIS — Z9889 Other specified postprocedural states: Secondary | ICD-10-CM

## 2019-06-01 DIAGNOSIS — K219 Gastro-esophageal reflux disease without esophagitis: Secondary | ICD-10-CM | POA: Insufficient documentation

## 2019-06-01 DIAGNOSIS — Z811 Family history of alcohol abuse and dependence: Secondary | ICD-10-CM | POA: Insufficient documentation

## 2019-06-01 DIAGNOSIS — Z820 Family history of epilepsy and other diseases of the nervous system: Secondary | ICD-10-CM | POA: Insufficient documentation

## 2019-06-01 DIAGNOSIS — X500XXA Overexertion from strenuous movement or load, initial encounter: Secondary | ICD-10-CM | POA: Diagnosis not present

## 2019-06-01 DIAGNOSIS — M5136 Other intervertebral disc degeneration, lumbar region: Secondary | ICD-10-CM | POA: Insufficient documentation

## 2019-06-01 DIAGNOSIS — Z823 Family history of stroke: Secondary | ICD-10-CM | POA: Diagnosis not present

## 2019-06-01 DIAGNOSIS — Z79899 Other long term (current) drug therapy: Secondary | ICD-10-CM | POA: Diagnosis not present

## 2019-06-01 DIAGNOSIS — M1990 Unspecified osteoarthritis, unspecified site: Secondary | ICD-10-CM | POA: Insufficient documentation

## 2019-06-01 DIAGNOSIS — D649 Anemia, unspecified: Secondary | ICD-10-CM | POA: Diagnosis not present

## 2019-06-01 DIAGNOSIS — Z419 Encounter for procedure for purposes other than remedying health state, unspecified: Secondary | ICD-10-CM

## 2019-06-01 DIAGNOSIS — S22080A Wedge compression fracture of T11-T12 vertebra, initial encounter for closed fracture: Secondary | ICD-10-CM | POA: Diagnosis not present

## 2019-06-01 DIAGNOSIS — K4030 Unilateral inguinal hernia, with obstruction, without gangrene, not specified as recurrent: Secondary | ICD-10-CM | POA: Insufficient documentation

## 2019-06-01 DIAGNOSIS — Z8249 Family history of ischemic heart disease and other diseases of the circulatory system: Secondary | ICD-10-CM | POA: Diagnosis not present

## 2019-06-01 DIAGNOSIS — E871 Hypo-osmolality and hyponatremia: Secondary | ICD-10-CM | POA: Diagnosis not present

## 2019-06-01 DIAGNOSIS — I10 Essential (primary) hypertension: Secondary | ICD-10-CM | POA: Insufficient documentation

## 2019-06-01 DIAGNOSIS — Z888 Allergy status to other drugs, medicaments and biological substances status: Secondary | ICD-10-CM | POA: Diagnosis not present

## 2019-06-01 DIAGNOSIS — M5126 Other intervertebral disc displacement, lumbar region: Secondary | ICD-10-CM | POA: Insufficient documentation

## 2019-06-01 DIAGNOSIS — R609 Edema, unspecified: Secondary | ICD-10-CM | POA: Insufficient documentation

## 2019-06-01 DIAGNOSIS — Z806 Family history of leukemia: Secondary | ICD-10-CM | POA: Diagnosis not present

## 2019-06-01 DIAGNOSIS — M810 Age-related osteoporosis without current pathological fracture: Secondary | ICD-10-CM | POA: Insufficient documentation

## 2019-06-01 DIAGNOSIS — E279 Disorder of adrenal gland, unspecified: Secondary | ICD-10-CM | POA: Diagnosis not present

## 2019-06-01 DIAGNOSIS — R2681 Unsteadiness on feet: Secondary | ICD-10-CM | POA: Insufficient documentation

## 2019-06-01 DIAGNOSIS — Z885 Allergy status to narcotic agent status: Secondary | ICD-10-CM | POA: Insufficient documentation

## 2019-06-01 DIAGNOSIS — Z86010 Personal history of colonic polyps: Secondary | ICD-10-CM | POA: Diagnosis not present

## 2019-06-01 HISTORY — PX: KYPHOPLASTY: SHX5884

## 2019-06-01 SURGERY — KYPHOPLASTY
Anesthesia: General

## 2019-06-01 MED ORDER — CHLORHEXIDINE GLUCONATE 4 % EX LIQD: 60.0000 mL | Freq: Once | CUTANEOUS | Status: DC

## 2019-06-01 MED ORDER — PROPOFOL 500 MG/50ML IV EMUL
INTRAVENOUS | Status: DC | PRN
  Administered 2019-06-01: 50 ug/kg/min via INTRAVENOUS

## 2019-06-01 MED ORDER — MIDAZOLAM HCL 2 MG/2ML IJ SOLN
INTRAMUSCULAR | Status: DC | PRN
  Administered 2019-06-01: .5 mg via INTRAVENOUS
  Administered 2019-06-01: 1 mg via INTRAVENOUS

## 2019-06-01 MED ORDER — CEFAZOLIN SODIUM-DEXTROSE 2-4 GM/100ML-% IV SOLN
INTRAVENOUS | Status: AC
  Filled 2019-06-01: qty 100

## 2019-06-01 MED ORDER — FENTANYL CITRATE (PF) 100 MCG/2ML IJ SOLN
25.0000 ug | INTRAMUSCULAR | Status: AC | PRN
  Administered 2019-06-01 (×4): 25 ug via INTRAVENOUS

## 2019-06-01 MED ORDER — LIDOCAINE HCL (PF) 1 % IJ SOLN
INTRAMUSCULAR | Status: AC
  Filled 2019-06-01: qty 60

## 2019-06-01 MED ORDER — METHOCARBAMOL 1000 MG/10ML IJ SOLN
500.0000 mg | Freq: Four times a day (QID) | INTRAVENOUS | Status: DC | PRN
  Administered 2019-06-01: 16:00:00 500 mg via INTRAVENOUS
  Filled 2019-06-01: qty 5

## 2019-06-01 MED ORDER — LIDOCAINE HCL (CARDIAC) PF 100 MG/5ML IV SOSY
PREFILLED_SYRINGE | INTRAVENOUS | Status: DC | PRN
  Administered 2019-06-01: 60 mg via INTRAVENOUS

## 2019-06-01 MED ORDER — ONDANSETRON HCL 4 MG PO TABS: 4.0000 mg | ORAL_TABLET | Freq: Four times a day (QID) | ORAL | Status: DC | PRN

## 2019-06-01 MED ORDER — KETAMINE HCL 10 MG/ML IJ SOLN
INTRAMUSCULAR | Status: DC | PRN
  Administered 2019-06-01: 25 mg via INTRAVENOUS

## 2019-06-01 MED ORDER — TRAMADOL HCL 50 MG PO TABS: 50.0000 mg | ORAL_TABLET | Freq: Four times a day (QID) | ORAL | Status: DC | PRN

## 2019-06-01 MED ORDER — FENTANYL CITRATE (PF) 100 MCG/2ML IJ SOLN
INTRAMUSCULAR | Status: AC
  Administered 2019-06-01: 13:00:00 25 ug via INTRAVENOUS
  Filled 2019-06-01: qty 2

## 2019-06-01 MED ORDER — FENTANYL CITRATE (PF) 100 MCG/2ML IJ SOLN
INTRAMUSCULAR | Status: AC
  Administered 2019-06-01: 15:00:00 25 ug via INTRAVENOUS
  Filled 2019-06-01: qty 2

## 2019-06-01 MED ORDER — LIDOCAINE HCL 1 % IJ SOLN
INTRAMUSCULAR | Status: DC | PRN
  Administered 2019-06-01 (×2): 10 mL

## 2019-06-01 MED ORDER — METHYLPREDNISOLONE SODIUM SUCC 125 MG IJ SOLR: 40.0000 mg | Freq: Once | INTRAMUSCULAR | Status: AC

## 2019-06-01 MED ORDER — KETAMINE HCL 50 MG/ML IJ SOLN
INTRAMUSCULAR | Status: AC
  Filled 2019-06-01: qty 10

## 2019-06-01 MED ORDER — PROPOFOL 10 MG/ML IV BOLUS
INTRAVENOUS | Status: AC
  Filled 2019-06-01: qty 40

## 2019-06-01 MED ORDER — OCUVITE-LUTEIN PO CAPS
2.0000 | ORAL_CAPSULE | Freq: Two times a day (BID) | ORAL | Status: DC
  Administered 2019-06-02: 10:00:00 2 via ORAL
  Filled 2019-06-01 (×2): qty 2

## 2019-06-01 MED ORDER — METHYLPREDNISOLONE SODIUM SUCC 125 MG IJ SOLR
INTRAMUSCULAR | Status: AC
  Administered 2019-06-01: 14:00:00 40 mg via INTRAVENOUS
  Filled 2019-06-01: qty 2

## 2019-06-01 MED ORDER — METHOCARBAMOL 500 MG PO TABS
500.0000 mg | ORAL_TABLET | Freq: Four times a day (QID) | ORAL | Status: DC | PRN
  Filled 2019-06-01: qty 1

## 2019-06-01 MED ORDER — AMLODIPINE BESYLATE 5 MG PO TABS
2.5000 mg | ORAL_TABLET | Freq: Every day | ORAL | Status: DC
  Administered 2019-06-02: 10:00:00 2.5 mg via ORAL
  Filled 2019-06-01: qty 0.5

## 2019-06-01 MED ORDER — MIDAZOLAM HCL 2 MG/2ML IJ SOLN
INTRAMUSCULAR | Status: AC
  Filled 2019-06-01: qty 2

## 2019-06-01 MED ORDER — METOCLOPRAMIDE HCL 10 MG PO TABS: 5.0000 mg | ORAL_TABLET | Freq: Three times a day (TID) | ORAL | Status: DC | PRN

## 2019-06-01 MED ORDER — BUPIVACAINE HCL (PF) 0.5 % IJ SOLN
INTRAMUSCULAR | Status: AC
  Filled 2019-06-01: qty 30

## 2019-06-01 MED ORDER — FAMOTIDINE 20 MG PO TABS
ORAL_TABLET | ORAL | Status: AC
  Administered 2019-06-01: 11:00:00 20 mg via ORAL
  Filled 2019-06-01: qty 1

## 2019-06-01 MED ORDER — PROPOFOL 10 MG/ML IV BOLUS
INTRAVENOUS | Status: DC | PRN
  Administered 2019-06-01 (×3): 20 mg via INTRAVENOUS

## 2019-06-01 MED ORDER — ONDANSETRON HCL 4 MG/2ML IJ SOLN: 4.0000 mg | Freq: Four times a day (QID) | INTRAMUSCULAR | Status: DC | PRN

## 2019-06-01 MED ORDER — LACTATED RINGERS IV SOLN: Freq: Once | INTRAVENOUS | Status: AC

## 2019-06-01 MED ORDER — METOCLOPRAMIDE HCL 5 MG/ML IJ SOLN: 5.0000 mg | Freq: Three times a day (TID) | INTRAMUSCULAR | Status: DC | PRN

## 2019-06-01 MED ORDER — BUPIVACAINE-EPINEPHRINE (PF) 0.5% -1:200000 IJ SOLN
INTRAMUSCULAR | Status: DC | PRN
  Administered 2019-06-01: 10 mL

## 2019-06-01 MED ORDER — EPINEPHRINE PF 1 MG/ML IJ SOLN
INTRAMUSCULAR | Status: AC
  Filled 2019-06-01: qty 1

## 2019-06-01 MED ORDER — ASPIRIN 81 MG PO TBEC
81.0000 mg | DELAYED_RELEASE_TABLET | Freq: Every day | ORAL | Status: DC
  Filled 2019-06-01: qty 1

## 2019-06-01 MED ORDER — FAMOTIDINE 20 MG PO TABS: 20.0000 mg | ORAL_TABLET | Freq: Once | ORAL | Status: AC

## 2019-06-01 MED ORDER — ACETAMINOPHEN 500 MG PO TABS: 1000.0000 mg | ORAL_TABLET | Freq: Four times a day (QID) | ORAL | Status: DC | PRN

## 2019-06-01 MED ORDER — SODIUM CHLORIDE 0.9 % IV SOLN: INTRAVENOUS | Status: DC

## 2019-06-01 MED ORDER — TRAMADOL HCL 50 MG PO TABS
ORAL_TABLET | ORAL | Status: AC
  Administered 2019-06-01: 18:00:00 50 mg via ORAL
  Filled 2019-06-01: qty 1

## 2019-06-01 MED ORDER — HYDROCODONE-ACETAMINOPHEN 5-325 MG PO TABS
1.0000 | ORAL_TABLET | ORAL | Status: DC | PRN
  Administered 2019-06-02: 11:00:00 1 via ORAL

## 2019-06-01 MED ORDER — TEMAZEPAM 30 MG PO CAPS
30.0000 mg | ORAL_CAPSULE | Freq: Every evening | ORAL | Status: DC | PRN
  Filled 2019-06-01: qty 1

## 2019-06-01 MED ORDER — LACTATED RINGERS IV SOLN: INTRAVENOUS | Status: DC | PRN

## 2019-06-01 SURGICAL SUPPLY — 20 items

## 2019-06-01 NOTE — Progress Notes (Signed)
Patient came out of OR with 9/10 pain. Gave patient Fentanyl 183mcg every 5 minutes as ordered. Patient has been laying in bed, NAD, since last dose of fentanyl. Asked patient if her pain was still a 7/10 or was it better, she stated "I guess so". Patient repositioned to left side, with minor relief.   Patient states she is really constipated from the pain medications. Patient also asked what could she take for pain at home. Instructed her that Dr Rudene Christians will have all that in her discharge papers.

## 2019-06-01 NOTE — Transfer of Care (Signed)
Immediate Anesthesia Transfer of Care Note  Patient: Barbara Vega  Procedure(s) Performed: T11 KYPHOPLASTY (N/A )  Patient Location: PACU  Anesthesia Type:General  Level of Consciousness: awake, oriented, drowsy and patient cooperative  Airway & Oxygen Therapy: Patient Spontanous Breathing  Post-op Assessment: Report given to RN and Post -op Vital signs reviewed and stable  Post vital signs: Reviewed and stable  Last Vitals:  Vitals Value Taken Time  BP 149/81 06/01/19 1229  Temp 36.3 C 06/01/19 1229  Pulse 67 06/01/19 1236  Resp 21 06/01/19 1236  SpO2 97 % 06/01/19 1236  Vitals shown include unvalidated device data.  Last Pain:  Vitals:   06/01/19 1229  TempSrc:   PainSc: 9          Complications: No apparent anesthesia complications

## 2019-06-01 NOTE — Progress Notes (Signed)
Moving legs well

## 2019-06-01 NOTE — Op Note (Signed)
Date 06/01/2019  Time  12:30 pm   PATIENT: Barbara Vega   PRE-OPERATIVE DIAGNOSIS:  closed wedge compression fracture of T11   POST-OPERATIVE DIAGNOSIS:  closed wedge compression fracture of T11   PROCEDURE:  Procedure(s): KYPHOPLASTY T11  SURGEON: Laurene Footman, MD   ASSISTANTS: None   ANESTHESIA:   local and MAC   EBL:  No intake/output data recorded.   BLOOD ADMINISTERED:none   DRAINS: none    LOCAL MEDICATIONS USED:  MARCAINE    and XYLOCAINE    SPECIMEN:   T11 vertebral body   DISPOSITION OF SPECIMEN:  Pathology   COUNTS:  YES   TOURNIQUET:  * No tourniquets in log *   IMPLANTS: Bone cement   DICTATION: .Dragon Dictation  patient was brought to the operating room and after adequate anesthesia was obtained the patient was placed prone.  C arm was brought in in good visualization of the affected level obtained on both AP and lateral projections.  After patient identification and timeout procedures were completed, local anesthetic was infiltrated with 10 cc 1% Xylocaine infiltrated subcutaneously.  This is done the area on the each side of the planned approach.  The back was then prepped and draped in the usual sterile manner and repeat timeout procedure carried out.  A spinal needle was brought down to the pedicle on the right side of  T11 and a 50-50 mix of 1% Xylocaine half percent Sensorcaine with epinephrine total of 20 cc injected.  After allowing this to set a small incision was made and the trocar was advanced into the vertebral body in an extrapedicular fashion.  Biopsy was obtained Drilling was carried out balloon inserted with inflation to 2 cc.  When the cement was appropriate consistency for cc were injected into the vertebral body without extravasation, good fill superior to inferior endplates and from right to left sides along the inferior endplate.  After the cement had set the trochar was removed and permanent C-arm views obtained.  The wound was closed with  Dermabond followed by Band-Aid   PLAN OF CARE: Discharge to home after PACU   PATIENT DISPOSITION:  PACU - hemodynamically stable.

## 2019-06-01 NOTE — Progress Notes (Signed)
Solu medrol given for pain control per dr Rudene Christians

## 2019-06-01 NOTE — H&P (Signed)
Reviewed paper H+P, will be scanned into chart. No changes noted.  

## 2019-06-01 NOTE — Anesthesia Preprocedure Evaluation (Signed)
Anesthesia Evaluation  Patient identified by MRN, date of birth, ID band Patient awake    Reviewed: Allergy & Precautions, NPO status , Patient's Chart, lab work & pertinent test results  History of Anesthesia Complications Negative for: history of anesthetic complications  Airway Mallampati: II  TM Distance: >3 FB Neck ROM: Full    Dental  (+) Poor Dentition, Missing, Upper Dentures, Partial Lower   Pulmonary neg pulmonary ROS, neg sleep apnea, neg COPD,    breath sounds clear to auscultation- rhonchi (-) wheezing      Cardiovascular Exercise Tolerance: Good hypertension, Pt. on medications (-) CAD, (-) Past MI, (-) Cardiac Stents and (-) CABG  Rhythm:Regular Rate:Normal - Systolic murmurs and - Diastolic murmurs    Neuro/Psych neg Seizures negative neurological ROS  negative psych ROS   GI/Hepatic Neg liver ROS, GERD  ,  Endo/Other  negative endocrine ROSneg diabetes  Renal/GU negative Renal ROS     Musculoskeletal  (+) Arthritis ,   Abdominal (+) - obese,   Peds  Hematology  (+) Blood dyscrasia, anemia ,   Anesthesia Other Findings Past Medical History: No date: Adenomatous colon polyp No date: Anemia     Comment:  hx No date: Arthritis No date: Benign breast lumps No date: History of colon polyps     Comment:  benign No date: Hypertension     Comment:  takes Triamterene-HCTZ daily No date: Insomnia     Comment:  takes Restoril nightly No date: Macular degeneration     Comment:  dry No date: Osteoporosis No date: Sinus infection     Comment:  taking Ceftin daily    Reproductive/Obstetrics                             Anesthesia Physical  Anesthesia Plan  ASA: III  Anesthesia Plan: General   Post-op Pain Management:    Induction: Intravenous  PONV Risk Score and Plan: 2 and Propofol infusion  Airway Management Planned: Natural Airway  Additional Equipment:    Intra-op Plan:   Post-operative Plan:   Informed Consent: I have reviewed the patients History and Physical, chart, labs and discussed the procedure including the risks, benefits and alternatives for the proposed anesthesia with the patient or authorized representative who has indicated his/her understanding and acceptance.     Dental advisory given  Plan Discussed with: CRNA and Anesthesiologist  Anesthesia Plan Comments:         Anesthesia Quick Evaluation

## 2019-06-01 NOTE — Progress Notes (Signed)
Dr Rudene Christians called pt continues to have severe pain regardless of pain medication

## 2019-06-02 DIAGNOSIS — S22080A Wedge compression fracture of T11-T12 vertebra, initial encounter for closed fracture: Secondary | ICD-10-CM | POA: Diagnosis not present

## 2019-06-02 MED ORDER — HYDROCODONE-ACETAMINOPHEN 5-325 MG PO TABS: 1.0000 | ORAL_TABLET | Freq: Three times a day (TID) | ORAL | 0 refills | Status: DC | PRN

## 2019-06-02 MED ORDER — HYDROCODONE-ACETAMINOPHEN 5-325 MG PO TABS
ORAL_TABLET | ORAL | Status: AC
  Filled 2019-06-02: qty 1

## 2019-06-02 MED ORDER — GABAPENTIN 300 MG PO CAPS
ORAL_CAPSULE | ORAL | Status: AC
  Filled 2019-06-02: qty 1

## 2019-06-02 NOTE — Discharge Summary (Signed)
Physician Discharge Summary  Patient ID: Barbara Vega MRN: [id number] DOB/AGE: 08-16-1933 86 y.o.  Admit date: 06/01/2019 Discharge date: 06/02/2019  Admission Diagnoses:  S/P kyphoplasty [id number]  Discharge Diagnoses: Patient Active Problem List   Diagnosis Date Noted  . S/P kyphoplasty 06/01/2019  . DDD (degenerative disc disease), lumbar 06/16/2017  . SI (sacroiliac) joint dysfunction 06/16/2017  . Incarcerated left inguinal hernia   . Small bowel obstruction ([place])   . Hyponatremia 11/20/2016  . GERD (gastroesophageal reflux disease) 09/20/2016  . Dysphagia, unspecified 08/09/2016  . Papilloma of breast 04/03/2015  . Osteoporosis, post-menopausal 03/04/2015  . Cannot sleep 03/04/2015  . BP (high blood pressure) 03/04/2015  . Adenomatous colon polyp 03/04/2015   Past Medical History:  Diagnosis Date  . Adenomatous colon polyp   . Anemia    hx  . Arthritis   . Benign breast lumps   . History of colon polyps    benign  . Hypertension    takes Triamterene-HCTZ daily  . Insomnia    takes Restoril nightly  . Macular degeneration    dry  . Osteoporosis   . Sinus infection    taking Ceftin daily    Transfusion: None.   Consultants (if any):   Discharged Condition: Improved  Hospital Course: Barbara Vega is an 86 y.o. female who was admitted 06/01/2019 with a diagnosis of s/p T11 Kyphoplasty and went to the operating room on 06/01/2019 and underwent the above named procedures.    Surgeries: Procedure(s): T11 KYPHOPLASTY on 06/01/2019 Patient tolerated the surgery well. Taken to PACU where she was stabilized, however patient has severe pain following surgery so she was kept overnight for pain control.  Patient's pain on the morning of 06/02/19 is much improved.  Patient's IV was removed on POD1  Implants: Bone cement  She was given perioperative antibiotics:  Anti-infectives (From admission, onward)   Start     Dose/Rate Route Frequency Ordered Stop    06/01/19 1021  ceFAZolin (ANCEF) 2-4 GM/100ML-% IVPB    Note to Pharmacy: Myles Lipps   : cabinet override      06/01/19 1021 06/01/19 1155   06/01/19 0600  ceFAZolin (ANCEF) IVPB 2g/100 mL premix     2 g 200 mL/hr over 30 Minutes Intravenous On call to O.R. 05/31/19 2223 06/01/19 1153    .  She was given sequential compression devices for DVT prophylaxis.  She benefited maximally from the hospital stay and there were no complications.    Recent vital signs:  Vitals:   06/02/19 0400 06/02/19 0742  BP: 132/62 128/68  Pulse: 77 81  Resp: 17 16  Temp: 98.7 F (37.1 C) (!) 97.4 F (36.3 C)  SpO2: 95% 95%    Recent laboratory studies:  Lab Results  Component Value Date   HGB 16.0 (H) 05/17/2019   HGB 13.6 04/21/2019   HGB 13.7 04/18/2018   Lab Results  Component Value Date   WBC 10.0 05/17/2019   PLT 456 (H) 05/17/2019   Lab Results  Component Value Date   INR 1.00 01/15/2017   Lab Results  Component Value Date   NA 136 05/17/2019   K 2.9 (L) 05/17/2019   CL 100 05/17/2019   CO2 24 05/17/2019   BUN 8 05/17/2019   CREATININE 0.49 05/17/2019   GLUCOSE 132 (H) 05/17/2019    Discharge Medications:   Allergies as of 06/02/2019      Reactions   Codone [hydrocodone] Nausea And Vomiting  Medication List    TAKE these medications   acetaminophen 500 MG tablet Commonly known as: TYLENOL Take 1,000 mg by mouth every 6 (six) hours as needed (pain).   amLODipine 2.5 MG tablet Commonly known as: NORVASC Take 2.5 mg by mouth daily.   aspirin EC 81 MG tablet Take 81 mg by mouth daily.   calcium carbonate 1250 (500 Ca) MG tablet Commonly known as: OS-CAL - dosed in mg of elemental calcium Take 1 tablet by mouth daily with breakfast.   cetirizine 10 MG tablet Commonly known as: ZYRTEC Take 10 mg by mouth daily as needed for allergies.   cholecalciferol 1000 units tablet Commonly known as: VITAMIN D Take 1,000 Units by mouth daily.   fluticasone 50  MCG/ACT nasal spray Commonly known as: FLONASE Place 2 sprays into both nostrils daily as needed for allergies.   HYDROcodone-acetaminophen 5-325 MG tablet Commonly known as: NORCO/VICODIN Take 1 tablet by mouth every 8 (eight) hours as needed for severe pain.   KRILL OIL PO Take 1 tablet by mouth daily.   Lidocanna 4 % Ptch Generic drug: Lidocaine Place 1 patch onto the skin daily as needed (pain.).   phenazopyridine 100 MG tablet Commonly known as: PYRIDIUM Take 1 tablet (100 mg total) by mouth 3 (three) times daily as needed for pain.   Premarin vaginal cream Generic drug: conjugated estrogens Place 1 Applicatorful vaginally daily. Use pea sized amount M-W-Fr before bedtime   PRESERVISION AREDS 2 PO Take 1 tablet by mouth 2 (two) times daily.   SUPER C-500 PO Take 1 tablet by mouth daily.   temazepam 30 MG capsule Commonly known as: RESTORIL Take 30 mg by mouth at bedtime.   traMADol 50 MG tablet Commonly known as: Ultram Take 1 tablet (50 mg total) by mouth every 6 (six) hours as needed for severe pain.   traMADol 50 MG tablet Commonly known as: Ultram Take 1 tablet (50 mg total) by mouth every 6 (six) hours as needed.       Diagnostic Studies: DG Thoracic Spine 2 View  Result Date: 06/01/2019 CLINICAL DATA:  T11 kyphoplasty. EXAM: DG C-ARM 1-60 MIN; THORACIC SPINE 2 VIEWS COMPARISON:  MRI lumbar spine 05/29/2019 FINDINGS: Two fluoroscopic spot images demonstrate kyphoplasty changes at T11. No complicating features. Remote kyphoplasty changes noted at T12. IMPRESSION: Kyphoplasty changes at [id number] without complicating features. Electronically Signed   By: Marijo Sanes M.D.   On: 06/01/2019 12:34   DG Thoracic Spine 2 View  Result Date: 05/15/2019 CLINICAL DATA:  T12 kyphoplasty. EXAM: DG C-ARM 1-60 MIN; THORACIC SPINE 2 VIEWS COMPARISON:  MRI lumbar spine dated May 07, 2019. FLUOROSCOPY TIME:  1 minutes, 20 seconds. C-arm fluoroscopic images were obtained  intraoperatively and submitted for post operative interpretation. FINDINGS: Intraoperative fluoroscopic images demonstrate interval cement augmentation of the moderate T12 compression fracture. IMPRESSION: Intraoperative fluoroscopic guidance for T12 kyphoplasty. Electronically Signed   By: Titus Dubin M.D.   On: 05/15/2019 15:25   MR LUMBAR SPINE WO CONTRAST  Result Date: 05/30/2019 CLINICAL DATA:  L3 compression fracture. EXAM: MRI LUMBAR SPINE WITHOUT CONTRAST TECHNIQUE: Multiplanar, multisequence MR imaging of the lumbar spine was performed. No intravenous contrast was administered. COMPARISON:  05/07/2019 FINDINGS: Segmentation:  Standard. Alignment:  1-2 mm retrolisthesis of L1 on L2 and L2 on L3. Vertebrae: Subacute T12 vertebral body compression fracture with interval augmentation with methylmethacrylate within the vertebral body with approximately 60% height loss. Severe residual marrow edema along the posterior half of the T12  vertebral body. 7 mm retropulsion of the inferior posterior margin of the T12 vertebral body impressing upon the thecal sac. Mild adjacent compression fracture along the anterior inferior margin of the T11 vertebral body with marrow edema. No other acute fracture.  No discitis or osteomyelitis. Conus medullaris and cauda equina: Conus extends to the L1 level. Conus and cauda equina appear normal. Paraspinal and other soft tissues: No acute paraspinal abnormality. Disc levels: Disc spaces: Degenerative disease with disc height loss at T11-12, T12-L1, L2-3, L3-4, and L4-5. T12-L1: No significant disc bulge. Mild bilateral foraminal stenosis. No central canal stenosis. L1-L2: Minimal broad-based disc bulge. Mild bilateral facet arthropathy. No evidence of neural foraminal stenosis. No central canal stenosis. L2-L3: Mild broad-based disc bulge. Moderate bilateral facet arthropathy. Mild left foraminal stenosis. No right foraminal stenosis. No central canal stenosis. L3-L4: Mild  broad-based disc bulge flattening the ventral thecal sac. Moderate bilateral facet arthropathy. Moderate right foraminal stenosis. Mild left foraminal stenosis. Bilateral subarticular recess narrowing. Mild spinal stenosis. L4-L5: Broad-based disc bulge. Mild bilateral facet arthropathy. Mild right foraminal stenosis. No left foraminal stenosis. No central canal stenosis. L5-S1: Mild broad-based disc bulge. Moderate left foraminal stenosis. No right foraminal stenosis. No central canal stenosis. IMPRESSION: 1. Subacute T12 vertebral body compression fracture with interval augmentation with methylmethacrylate within the vertebral body with approximately 60% height loss. Severe residual marrow edema along the posterior half of the T12 vertebral body. 7 mm retropulsion of the inferior posterior margin of the T12 vertebral body impressing upon the thecal sac resulting in mild spinal stenosis. 2. Mild acute adjacent compression fracture along the anterior inferior margin of the T11 vertebral body with marrow edema and minimal height loss. 3. Diffuse lumbar spine spondylosis as described above. Electronically Signed   By: Kathreen Devoid   On: 05/30/2019 08:28   MR LUMBAR SPINE WO CONTRAST  Result Date: 05/07/2019 CLINICAL DATA:  Low back pain, T12 compression fracture EXAM: MRI LUMBAR SPINE WITHOUT CONTRAST TECHNIQUE: Multiplanar, multisequence MR imaging of the lumbar spine was performed. No intravenous contrast was administered. COMPARISON:  Correlation made with CT abdomen 04/21/2019 FINDINGS: Segmentation:  Standard. Alignment:  There is trace retrolisthesis at L1-L2. Vertebrae: Compression deformity of the T12 vertebral body with progression of height loss since the 04/21/2019 CT, now nearly 50%. There is new osseous retropulsion causing mild canal stenosis. Associated marrow edema is present. Chronic degenerative endplate irregularity at L2-L3 and L4-L5. There are minor degenerative endplate marrow changes. Conus  medullaris and cauda equina: Conus extends to the L1 level. Conus and cauda equina appear normal. Paraspinal and other soft tissues: Left adrenal lesion better evaluated on prior CT imaging. Disc levels: L1-L2:  Disc bulge.  No canal or foraminal stenosis. L2-L3: Disc bulge with superimposed left foraminal protrusion, endplate osteophytes, and facet arthropathy. No canal stenosis. Slight effacement of the left lateral recess. Minor right and moderate left foraminal stenosis. L3-L4: Disc bulge with superimposed bilateral foraminal protrusions, endplate osteophytes, and facet arthropathy with ligamentum flavum infolding. Minor canal stenosis. Moderate right and mild to moderate left foraminal stenosis. L4-L5: Disc bulge with superimposed right foraminal protrusion, endplate osteophytes, and facet arthropathy with ligamentum flavum infolding. Minor canal stenosis with slight effacement of the lateral recesses. Moderate right and mild left foraminal stenosis. L5-S1: Disc bulge with superimposed left foraminal protrusion, endplate osteophytes, facet arthropathy. No canal stenosis. No right foraminal stenosis. Moderate to marked left foraminal stenosis. IMPRESSION: Subacute T12 compression fracture with nearly 50% loss of height, increased since 04/21/2019. There is  new endplate retropulsion causing mild canal stenosis. Multilevel degenerative changes as detailed above. There is no high-grade canal stenosis. Left greater than right foraminal stenosis is present. Electronically Signed   By: Macy Mis M.D.   On: 05/07/2019 17:23   DG C-Arm 1-60 Min  Result Date: 06/01/2019 CLINICAL DATA:  T11 kyphoplasty. EXAM: DG C-ARM 1-60 MIN; THORACIC SPINE 2 VIEWS COMPARISON:  MRI lumbar spine 05/29/2019 FINDINGS: Two fluoroscopic spot images demonstrate kyphoplasty changes at T11. No complicating features. Remote kyphoplasty changes noted at T12. IMPRESSION: Kyphoplasty changes at [id number] without complicating features.  Electronically Signed   By: Marijo Sanes M.D.   On: 06/01/2019 12:34   DG C-Arm 1-60 Min  Result Date: 05/15/2019 CLINICAL DATA:  T12 kyphoplasty. EXAM: DG C-ARM 1-60 MIN; THORACIC SPINE 2 VIEWS COMPARISON:  MRI lumbar spine dated May 07, 2019. FLUOROSCOPY TIME:  1 minutes, 20 seconds. C-arm fluoroscopic images were obtained intraoperatively and submitted for post operative interpretation. FINDINGS: Intraoperative fluoroscopic images demonstrate interval cement augmentation of the moderate T12 compression fracture. IMPRESSION: Intraoperative fluoroscopic guidance for T12 kyphoplasty. Electronically Signed   By: Titus Dubin M.D.   On: 05/15/2019 15:25    Disposition: Discharge disposition: 01-Home or [place]    Hessie Knows, MD Follow up in 2 week(s).   Specialty: Orthopedic Surgery Why: For wound re-check, appointment already made Contact information: [place] [address] [phone]          Signed: Judson Roch PA-C 06/02/2019, 9:38 AM

## 2019-06-02 NOTE — Progress Notes (Signed)
  Subjective: 1 Day Post-Op Procedure(s) (LRB): T11 KYPHOPLASTY (N/A) Patient reports pain as mild.   Patient is well, and has had no acute complaints or problems Plan is to go Home after hospital stay. Negative for chest pain and shortness of breath Fever: no Gastrointestinal:Negative for nausea and vomiting  Objective: Vital signs in last 24 hours: Temp:  [97.4 F (36.3 C)-99 F (37.2 C)] 97.4 F (36.3 C) (01/16 0742) Pulse Rate:  [62-99] 81 (01/16 0742) Resp:  [12-25] 16 (01/16 0742) BP: (93-161)/(58-87) 128/68 (01/16 0742) SpO2:  [91 %-99 %] 95 % (01/16 0742) Weight:  [64.4 kg] 64.4 kg (01/15 1029)  Intake/Output from previous day:  Intake/Output Summary (Last 24 hours) at 06/02/2019 0933 Last data filed at 06/02/2019 0600 Gross per 24 hour  Intake 2045 ml  Output 202 ml  Net 1843 ml    Intake/Output this shift: No intake/output data recorded.  Labs: No results for input(s): HGB in the last 72 hours. No results for input(s): WBC, RBC, HCT, PLT in the last 72 hours. No results for input(s): NA, K, CL, CO2, BUN, CREATININE, GLUCOSE, CALCIUM in the last 72 hours. No results for input(s): LABPT, INR in the last 72 hours.   EXAM General - Patient is Alert and Appropriate Extremity - ABD soft Sensation intact distally Intact pulses distally Dorsiflexion/Plantar flexion intact Dressing/Incision - clean, dry, no drainage, minimal tenderness with palpation over the incision site. Motor Function - intact, moving foot and toes well on exam.   Past Medical History:  Diagnosis Date  . Adenomatous colon polyp   . Anemia    hx  . Arthritis   . Benign breast lumps   . History of colon polyps    benign  . Hypertension    takes Triamterene-HCTZ daily  . Insomnia    takes Restoril nightly  . Macular degeneration    dry  . Osteoporosis   . Sinus infection    taking Ceftin daily     Assessment/Plan: 1 Day Post-Op Procedure(s) (LRB): T11 KYPHOPLASTY (N/A) Active  Problems:   S/P kyphoplasty  Estimated body mass index is 25.97 kg/m as calculated from the following:   Height as of this encounter: 5\' 2"  (1.575 m).   Weight as of this encounter: 64.4 kg.   Patient is sitting comfortably this morning. Plan for discharge home today.  DVT Prophylaxis - TED hose  J. Cameron Proud, PA-C [facility] 06/02/2019, 9:33 AM

## 2019-06-02 NOTE — Anesthesia Postprocedure Evaluation (Signed)
Anesthesia Post Note  Patient: Setsuko Kiplinger Ragan  Procedure(s) Performed: T11 KYPHOPLASTY (N/A )  Patient location during evaluation: PACU Anesthesia Type: General Level of consciousness: awake and alert Pain management: pain level controlled Vital Signs Assessment: post-procedure vital signs reviewed and stable Respiratory status: spontaneous breathing, nonlabored ventilation, respiratory function stable and patient connected to nasal cannula oxygen Cardiovascular status: blood pressure returned to baseline and stable Postop Assessment: no apparent nausea or vomiting Anesthetic complications: no     Last Vitals:  Vitals:   06/01/19 2000 06/02/19 0400  BP: (!) 122/59 132/62  Pulse: 80 77  Resp: 16 17  Temp: 36.8 C 37.1 C  SpO2: 94% 95%    Last Pain:  Vitals:   06/02/19 0600  TempSrc:   PainSc: 0-No pain                 Precious Haws Donita Newland

## 2019-06-02 NOTE — OR Nursing (Signed)
Patient woke up and thought it was morning. Had gotten out of bed and was trying to get dressed to go home.  Ambulated patient to bathroom and reoriented her to time.  Went back to bed after used bathroom.

## 2019-06-02 NOTE — Progress Notes (Signed)
Physical therapy here to evaluate patient, patient walked without difficulty or concern around unit.  No acute distress.

## 2019-06-02 NOTE — Progress Notes (Signed)
Physical Therapy Evaluation Patient Details Name: Barbara Vega MRN: [id number] DOB: 1933-11-17 Today's Date: 06/02/2019   History of Present Illness  Barbara Vega is an 76yoF who [facility] for elective T11 kyphoplasty. Pt reports severe pain over the last week and limited capacity to get out of bed at times due to pain. Pt seen by PT on 1/16 (POD1) in Pre-op holding area.  Clinical Impression  Pt presenting with above diagnosis. Pt currently with functional limitations due to the deficits listed below (see "PT Problem List"). Upon entry, pt seated cattywampus sideways in a semirecumbent bed, attempting to exit bed unsupervisied, but is agreeable to participate. The pt is alert and oriented x4, pleasant, and conversational. Pt is tangential during collection of history and require redirection several times in order to obtain basic information. Pt is somewhat impulsive in session and mostly flat affect, but information regarding baseline cognition is limited. Per [facility] pt has some disorientation/confusion with nursing overnight. Pt requires no physical assist for basic mobility, but she does endorse some feelings of unsteadiness on feet that are different from baseline. This instability is evident in gait, however is able to AMB the unit 3x without device and without LOB. Pt encouraged to use a RW at home until her function has returned. Functional mobility assessment demonstrates increased effort/time requirements, poor tolerance, and need for physical assistance, whereas the patient performed these at a higher level of independence PTA. Patient is at near baseline, all education completed, and time is given to address all questions/concerns. No additional skilled PT services needed at this time, PT signing off. PT recommends ambulation with nursing staff as needed. Family should monitor patient to assess safety at home.       Follow Up Recommendations Follow surgeon's recommendation for DC  plan and follow-up therapies;Supervision for mobility/OOB;Supervision - Intermittent;Other (comment)(family should assert that patient is back to cognitive and AMB baseline)    Equipment Recommendations  None recommended by PT    Recommendations for Other Services       Precautions / Restrictions Precautions Precautions: Fall Restrictions Weight Bearing Restrictions: No      Mobility  Bed Mobility Overal bed mobility: Independent                Transfers Overall transfer level: Needs assistance Equipment used: None Transfers: Sit to/from Stand Sit to Stand: Supervision         General transfer comment: impulsively coming to standing as if to go somewhere multiple times during questioning, appears to have some difficulty with immediate balance and some impulsivity.  Ambulation/Gait Ambulation/Gait assistance: Supervision Gait Distance (Feet): 500 Feet Assistive device: None Gait Pattern/deviations: Scissoring;Drifts right/left;Narrow base of support     General Gait Details: Pt attests to instability in gait unlike her baseline, she has good confidence, and has no frank LOB, but frequent scissoring. Pt able to AMB the preop unit 3x, SpO2 WNL. Pt encouraged to use RW upon return to home until fully recovered.  Stairs            Wheelchair Mobility    Modified Rankin (Stroke Patients Only)       Balance Overall balance assessment: Mild deficits observed, not formally tested;Modified Independent                                           Pertinent Vitals/Pain Pain Assessment: 0-10  Pain Score: 5  Pain Location: Rt flank pain Pain Intervention(s): Limited activity within patient's tolerance;Monitored during session;Premedicated before session    [place] expects to be discharged to:: Private residence Living Arrangements: Alone Available Help at Discharge: Family;Available PRN/intermittently(DTR)            Home Equipment: Walker - 2 wheels;Cane - single point      Prior Function Level of Independence: Independent         Comments: lives alone, formerly AMB without limitation, no use of device, no recent falls history in the past 3 months.     Hand Dominance        Extremity/Trunk Assessment                Communication   Communication: No difficulties  Cognition Arousal/Alertness: Awake/alert Behavior During Therapy: Flat affect;Impulsive Overall Cognitive Status: No family/caregiver present to determine baseline cognitive functioning                                 General Comments: Pt alert, conversational, but often tangential during questioning, rarely providing answers to question asked.      General Comments      Exercises     Assessment/Plan    PT Assessment Patent does not need any further PT services  PT Problem List         PT Treatment Interventions      PT Goals (Current goals can be found in the Care Plan section)  Acute Rehab PT Goals PT Goal Formulation: All assessment and education complete, DC therapy    Frequency     Barriers to discharge        Co-evaluation               AM-PAC PT "6 Clicks" Mobility  Outcome Measure Help needed turning from your back to your side while in a flat bed without using bedrails?: None Help needed moving from lying on your back to sitting on the side of a flat bed without using bedrails?: None Help needed moving to and from a bed to a chair (including a wheelchair)?: None Help needed standing up from a chair using your arms (e.g., wheelchair or bedside chair)?: None Help needed to walk in hospital room?: A Little Help needed climbing 3-5 steps with a railing? : A Little 6 Click Score: 22    End of Session Equipment Utilized During Treatment: Gait belt Activity Tolerance: Patient tolerated treatment well;Patient limited by pain Patient left: in chair;with call bell/phone  within reach Nurse Communication: Mobility status PT Visit Diagnosis: Other abnormalities of gait and mobility (R26.89);Difficulty in walking, not elsewhere classified (R26.2);Ataxic gait (R26.0)    Time: 1056-1106 PT Time Calculation (min) (ACUTE ONLY): 10 min   Charges:   PT Evaluation $PT Eval Low Complexity: 1 Low         11:35 AM, 06/02/19 Etta Grandchild, PT, DPT Physical Therapist - [facility]  [phone] ([place])    Barbara Vega C 06/02/2019, 11:29 AM

## 2019-06-02 NOTE — OR Nursing (Signed)
Patient requested something for constipation.  Nothing ordered.  Will ask doctor in a.m.

## 2019-06-04 LAB — SURGICAL PATHOLOGY

## 2019-06-07 ENCOUNTER — Other Ambulatory Visit: Payer: Self-pay

## 2019-06-07 ENCOUNTER — Ambulatory Visit: Payer: Medicare Other | Admitting: Podiatry

## 2019-06-07 ENCOUNTER — Encounter: Payer: Self-pay | Admitting: Podiatry

## 2019-06-07 DIAGNOSIS — M79675 Pain in left toe(s): Secondary | ICD-10-CM | POA: Diagnosis not present

## 2019-06-07 DIAGNOSIS — M79674 Pain in right toe(s): Secondary | ICD-10-CM

## 2019-06-07 DIAGNOSIS — B351 Tinea unguium: Secondary | ICD-10-CM | POA: Diagnosis not present

## 2019-06-07 NOTE — Progress Notes (Signed)
Complaint:  Visit Type: Patient returns to my office for continued preventative foot care services. Complaint: Patient states" my nails have grown long and thick and become painful to walk and wear shoes" . The patient presents for preventative foot care services.  She presents to the office with her son.  Podiatric Exam: Vascular: dorsalis pedis and posterior tibial pulses are palpable bilateral. Capillary return is immediate. Temperature gradient is WNL. Skin turgor WNL  Sensorium: Normal Semmes Weinstein monofilament test. Normal tactile sensation bilaterally. Nail Exam: Pt has thick disfigured discolored nails with subungual debris noted bilateral entire nail hallux through fifth toenails Ulcer Exam: There is no evidence of ulcer or pre-ulcerative changes or infection. Orthopedic Exam: Muscle tone and strength are WNL. No limitations in general ROM. No crepitus or effusions noted. Multiple hammer toes both feet.. Bony prominences are unremarkable. Skin: No Porokeratosis. No infection or ulcers  Diagnosis:  Onychomycosis, , Pain in right toe, pain in left toes  Treatment & Plan Procedures and Treatment: Consent by patient was obtained for treatment procedures.   Debridement of mycotic and hypertrophic toenails, 1 through 5 bilateral and clearing of subungual debris. No ulceration, no infection noted.  Return Visit-Office Procedure: Patient instructed to return to the office for a follow up visit 4 months for continued evaluation and treatment.    Gardiner Barefoot DPM

## 2019-06-14 ENCOUNTER — Other Ambulatory Visit: Payer: Self-pay

## 2019-06-14 ENCOUNTER — Emergency Department: Payer: Medicare Other

## 2019-06-14 ENCOUNTER — Encounter: Payer: Self-pay | Admitting: Emergency Medicine

## 2019-06-14 ENCOUNTER — Observation Stay
Admission: EM | Admit: 2019-06-14 | Discharge: 2019-06-15 | Disposition: A | Discharge status: Home / Self Care | Payer: Medicare Other | Attending: Internal Medicine | Admitting: Internal Medicine

## 2019-06-14 DIAGNOSIS — Z79890 Hormone replacement therapy (postmenopausal): Secondary | ICD-10-CM | POA: Diagnosis not present

## 2019-06-14 DIAGNOSIS — R410 Disorientation, unspecified: Secondary | ICD-10-CM | POA: Diagnosis not present

## 2019-06-14 DIAGNOSIS — Z7982 Long term (current) use of aspirin: Secondary | ICD-10-CM | POA: Diagnosis not present

## 2019-06-14 DIAGNOSIS — I10 Essential (primary) hypertension: Secondary | ICD-10-CM | POA: Insufficient documentation

## 2019-06-14 DIAGNOSIS — E871 Hypo-osmolality and hyponatremia: Secondary | ICD-10-CM | POA: Insufficient documentation

## 2019-06-14 DIAGNOSIS — E876 Hypokalemia: Secondary | ICD-10-CM | POA: Diagnosis not present

## 2019-06-14 DIAGNOSIS — Z885 Allergy status to narcotic agent status: Secondary | ICD-10-CM | POA: Insufficient documentation

## 2019-06-14 DIAGNOSIS — Z66 Do not resuscitate: Secondary | ICD-10-CM | POA: Insufficient documentation

## 2019-06-14 DIAGNOSIS — M5136 Other intervertebral disc degeneration, lumbar region: Secondary | ICD-10-CM | POA: Diagnosis not present

## 2019-06-14 DIAGNOSIS — M1990 Unspecified osteoarthritis, unspecified site: Secondary | ICD-10-CM | POA: Insufficient documentation

## 2019-06-14 DIAGNOSIS — K5730 Diverticulosis of large intestine without perforation or abscess without bleeding: Secondary | ICD-10-CM | POA: Diagnosis not present

## 2019-06-14 DIAGNOSIS — Z20822 Contact with and (suspected) exposure to covid-19: Secondary | ICD-10-CM | POA: Insufficient documentation

## 2019-06-14 DIAGNOSIS — R531 Weakness: Secondary | ICD-10-CM | POA: Diagnosis present

## 2019-06-14 DIAGNOSIS — M810 Age-related osteoporosis without current pathological fracture: Secondary | ICD-10-CM | POA: Diagnosis not present

## 2019-06-14 DIAGNOSIS — Z79899 Other long term (current) drug therapy: Secondary | ICD-10-CM | POA: Diagnosis not present

## 2019-06-14 LAB — BASIC METABOLIC PANEL
Anion gap: 14 (ref 5–15)
BUN: 9 mg/dL (ref 8–23)
CO2: 30 mmol/L (ref 22–32)
Calcium: 9.5 mg/dL (ref 8.9–10.3)
Chloride: 96 mmol/L — ABNORMAL LOW (ref 98–111)
Creatinine, Ser: 0.48 mg/dL (ref 0.44–1.00)
GFR calc Af Amer: >60 mL/min (ref >60)
GFR calc non Af Amer: >60 mL/min (ref >60)
Glucose, Bld: 113 mg/dL — ABNORMAL HIGH (ref 70–99)
Potassium: 2.5 mmol/L — CL (ref 3.5–5.1)
Sodium: 140 mmol/L (ref 135–145)

## 2019-06-14 LAB — CBC
HCT: 42.6 % (ref 36.0–46.0)
Hemoglobin: 13.8 g/dL (ref 12.0–15.0)
MCH: 29.4 pg (ref 26.0–34.0)
MCHC: 32.4 g/dL (ref 30.0–36.0)
MCV: 90.8 fL (ref 80.0–100.0)
Platelets: 395 10*3/uL (ref 150–400)
RBC: 4.69 MIL/uL (ref 3.87–5.11)
RDW: 15.3 % (ref 11.5–15.5)
WBC: 6.4 10*3/uL (ref 4.0–10.5)
nRBC: 0 % (ref 0.0–0.2)

## 2019-06-14 LAB — URINALYSIS, COMPLETE (UACMP) WITH MICROSCOPIC
Bacteria, UA: NONE SEEN
Bilirubin Urine: NEGATIVE
Glucose, UA: NEGATIVE mg/dL
Hgb urine dipstick: NEGATIVE
Ketones, ur: 5 mg/dL — AB
Leukocytes,Ua: NEGATIVE
Nitrite: NEGATIVE
Protein, ur: NEGATIVE mg/dL
Specific Gravity, Urine: 1.008 (ref 1.005–1.030)
pH: 7 (ref 5.0–8.0)

## 2019-06-14 LAB — RESPIRATORY PANEL BY RT PCR (FLU A&B, COVID)
Influenza A by PCR: NEGATIVE
Influenza B by PCR: NEGATIVE
SARS Coronavirus 2 by RT PCR: NEGATIVE

## 2019-06-14 LAB — MAGNESIUM: Magnesium: 2.1 mg/dL (ref 1.7–2.4)

## 2019-06-14 MED ORDER — POTASSIUM CHLORIDE 10 MEQ/100ML IV SOLN
10.0000 meq | Freq: Once | INTRAVENOUS | Status: AC
  Administered 2019-06-14: 10 meq via INTRAVENOUS
  Filled 2019-06-14 (×2): qty 100

## 2019-06-14 MED ORDER — POTASSIUM CHLORIDE CRYS ER 20 MEQ PO TBCR
40.0000 meq | EXTENDED_RELEASE_TABLET | Freq: Once | ORAL | Status: AC
  Administered 2019-06-14: 40 meq via ORAL
  Filled 2019-06-14: qty 2

## 2019-06-14 MED ORDER — ENOXAPARIN SODIUM 40 MG/0.4ML ~~LOC~~ SOLN
40.0000 mg | SUBCUTANEOUS | Status: DC
  Administered 2019-06-15: 02:00:00 40 mg via SUBCUTANEOUS
  Filled 2019-06-14: qty 0.4

## 2019-06-14 MED ORDER — SODIUM CHLORIDE 0.9 % IV BOLUS
1000.0000 mL | Freq: Once | INTRAVENOUS | Status: AC
  Administered 2019-06-14: 1000 mL via INTRAVENOUS

## 2019-06-14 MED ORDER — POTASSIUM CHLORIDE 20 MEQ/15ML (10%) PO SOLN
40.0000 meq | Freq: Once | ORAL | Status: AC
  Administered 2019-06-15: 40 meq via ORAL
  Filled 2019-06-14 (×2): qty 30

## 2019-06-14 MED ORDER — AMLODIPINE BESYLATE 5 MG PO TABS
2.5000 mg | ORAL_TABLET | Freq: Every day | ORAL | Status: DC
  Administered 2019-06-15 (×2): 2.5 mg via ORAL
  Filled 2019-06-14 (×2): qty 1

## 2019-06-14 MED ORDER — ONDANSETRON HCL 4 MG/2ML IJ SOLN: 4.0000 mg | Freq: Four times a day (QID) | INTRAMUSCULAR | Status: DC | PRN

## 2019-06-14 MED ORDER — ONDANSETRON HCL 4 MG PO TABS
4.0000 mg | ORAL_TABLET | Freq: Four times a day (QID) | ORAL | Status: DC | PRN
  Administered 2019-06-15: 4 mg via ORAL
  Filled 2019-06-14: qty 1

## 2019-06-14 MED ORDER — DOCUSATE SODIUM 100 MG PO CAPS: 100.0000 mg | ORAL_CAPSULE | Freq: Every evening | ORAL | Status: DC | PRN

## 2019-06-14 NOTE — H&P (Signed)
History and Physical    Barbara Vega President [id number] DOB: April 06, 1934 DOA: 06/14/2019  PCP: [name], MD  Patient coming from: Home  I have personally briefly reviewed patient's old medical records in [place]  Chief Complaint: Generalized weakness  HPI: Barbara Vega is a 86 y.o. female with medical history significant for hypertension and degenerative disc disease s/p T12 kyphoplasty on 05/14/2020 and T11 kyphoplasty on 06/01/2019 who presents to the ED for evaluation of generalized weakness and fatigue.  Patient reports about 2 months of progressive weight loss, poor oral intake, and now worsening generalized weakness limiting her activity.  She reports having frequent nausea without emesis.  She reports a couple episodes of watery stool.  She otherwise denies any subjective fevers, chills, diaphoresis, abdominal pain, falls, or loss of consciousness.  ED Course:  Initial vitals showed BP 180/60, pulse 75, RR 18, temp 98.4 Fahrenheit, SPO2 97% on room air.  Labs are notable for potassium 2.5, magnesium 2.1, sodium 140, bicarb 30, BUN 9, creatinine 0.48, WBC 6.4, hemoglobin 13.8, platelets 395,000.  Urinalysis shows negative nitrates, negative leukocytes, 0-5 RBCs, 0-5 WBCs, no bacteria on microscopy.  SARS-CoV-2 PCR panel is collected and pending.  CT renal stone study shows no acute findings to suggest etiology of bilateral flank pain, no evidence of obstructive uropathy.  Scattered colonic diverticulosis without evidence of acute diverticulitis is seen.  Stable compression fractures of T11 and T12 status post cement augmentation are seen.  Similar severe discogenic endplate changes at X33443 and L4-5 are seen.  Patient was given 1 L normal saline, K 10 mEq IV x1, and K 40 mEq oral x1.  The hospitalist service was consulted to admit for further evaluation and management.  Review of Systems: All systems reviewed and are negative except as documented in history of present  illness above.   Past Medical History:  Diagnosis Date  . Adenomatous colon polyp   . Anemia    hx  . Arthritis   . Benign breast lumps   . History of colon polyps    benign  . Hypertension    takes Triamterene-HCTZ daily  . Insomnia    takes Restoril nightly  . Macular degeneration    dry  . Osteoporosis   . Sinus infection    taking Ceftin daily     Past Surgical History:  Procedure Laterality Date  . ABDOMINAL HYSTERECTOMY    . BREAST EXCISIONAL BIOPSY    . BREAST LUMPECTOMY Right 09/08/2015   intraductal papilloma  . BREAST LUMPECTOMY Left    2 bx done only one scar seen years ago neg  . cataract surgery Bilateral   . COLONOSCOPY    . COLONOSCOPY WITH PROPOFOL N/A 11/03/2016   Procedure: COLONOSCOPY WITH PROPOFOL;  Surgeon: Manya Silvas, MD;  Location: [facility] ENDOSCOPY;  Service: Endoscopy;  Laterality: N/A;  . ESOPHAGOGASTRODUODENOSCOPY (EGD) WITH PROPOFOL N/A 11/03/2016   Procedure: ESOPHAGOGASTRODUODENOSCOPY (EGD) WITH PROPOFOL;  Surgeon: Manya Silvas, MD;  Location: [facility] ENDOSCOPY;  Service: Endoscopy;  Laterality: N/A;  . INGUINAL HERNIA REPAIR Left 01/15/2017   Procedure: HERNIA REPAIR INGUINAL ADULT;  Surgeon: Florene Glen, MD;  Location: [facility] ORS;  Service: General;  Laterality: Left;  . KYPHOPLASTY N/A 05/15/2019   Procedure: KYPHOPLASTY T12;  Surgeon: Hessie Knows, MD;  Location: [facility] ORS;  Service: Orthopedics;  Laterality: N/A;  . KYPHOPLASTY N/A 06/01/2019   Procedure: T11 KYPHOPLASTY;  Surgeon: Hessie Knows, MD;  Location: [facility] ORS;  Service: Orthopedics;  Laterality: N/A;  .  RADIOACTIVE SEED GUIDED EXCISIONAL BREAST BIOPSY Right 09/08/2015   Procedure: RADIOACTIVE SEED GUIDED EXCISIONAL BREAST BIOPSY;  Surgeon: Rolm Bookbinder, MD;  Location: [place];  Service: General;  Laterality: Right;    Social History:  reports that she has never smoked. She has never used smokeless tobacco. She reports that she does not drink alcohol or use  drugs.  Allergies  Allergen Reactions  . Codone [Hydrocodone] Nausea And Vomiting    Family History  Problem Relation Age of Onset  . Osteoporosis Mother   . Heart failure Mother   . Heart block Brother   . Alcohol abuse Brother   . Bladder Cancer Neg Hx   . Kidney cancer Neg Hx      Prior to Admission medications   Medication Sig Start Date End Date Taking? Authorizing Provider  acetaminophen (TYLENOL) 500 MG tablet Take 1,000 mg by mouth every 6 (six) hours as needed (pain).    [provider]  amLODipine (NORVASC) 2.5 MG tablet Take 2.5 mg by mouth daily. 11/06/18   [provider]  aspirin EC 81 MG tablet Take 81 mg by mouth daily.    [provider]  Bioflavonoid Products (SUPER C-500 PO) Take 1 tablet by mouth daily.    [provider]  calcium carbonate (OS-CAL - DOSED IN MG OF ELEMENTAL CALCIUM) 1250 (500 CA) MG tablet Take 1 tablet by mouth daily with breakfast.     [provider]  cetirizine (ZYRTEC) 10 MG tablet Take 10 mg by mouth daily as needed for allergies. 04/17/19   [provider]  cholecalciferol (VITAMIN D) 1000 UNITS tablet Take 1,000 Units by mouth daily.    [provider]  conjugated estrogens (PREMARIN) vaginal cream Place 1 Applicatorful vaginally daily. Use pea sized amount M-W-Fr before bedtime 01/26/19   Hollice Espy, MD  fluticasone [facility]) 50 MCG/ACT nasal spray Place 2 sprays into both nostrils daily as needed for allergies. 04/17/19   [provider]  gentamicin ointment (GARAMYCIN) 0.1 % gentamicin 0.1 % topical ointment    [provider]  HYDROcodone-acetaminophen (NORCO/VICODIN) 5-325 MG tablet Take 1 tablet by mouth every 8 (eight) hours as needed for severe pain. 06/02/19   Lattie Corns, PA-C  KRILL OIL PO Take 1 tablet by mouth daily.     [provider]  Lidocaine (LIDOCANNA) 4 % PTCH Place 1 patch onto the skin daily as needed (pain.).     [provider]  methylPREDNISolone (MEDROL DOSEPAK) 4 MG TBPK tablet Follow package directions. 05/21/19   [provider]  Multiple Vitamins-Minerals (PRESERVISION AREDS 2 PO) Take 1 tablet by mouth 2 (two) times daily.    [provider]  ondansetron (ZOFRAN-ODT) 4 MG disintegrating tablet ondansetron 4 mg disintegrating tablet 05/17/19   [provider]  phenazopyridine (PYRIDIUM) 100 MG tablet Take 1 tablet (100 mg total) by mouth 3 (three) times daily as needed for pain. 05/01/19 04/30/20  Rudene Re, MD  predniSONE (DELTASONE) 10 MG tablet Take by mouth. 05/30/19   [provider]  temazepam (RESTORIL) 30 MG capsule Take 30 mg by mouth at bedtime.  09/13/14   [provider]  traMADol (ULTRAM) 50 MG tablet Take 1 tablet (50 mg total) by mouth every 6 (six) hours as needed for severe pain. 04/21/19   Delman Kitten, MD  traMADol (ULTRAM) 50 MG tablet Take 1 tablet (50 mg total) by mouth every 6 (six) hours as needed. 05/15/19 05/14/20  Hessie Knows, MD  Physical Exam: Vitals:   06/14/19 1527 06/14/19 1830 06/14/19 1900 06/14/19 1930  BP: (!) 180/60 (!) 155/74 (!) 150/77 (!) 155/76  Pulse: 75 69 78 81  Resp: 18 (!) 29 (!) 27 20  Temp: 98.4 F (36.9 C)     TempSrc: Oral     SpO2: 97% 96% 96% 95%  Weight: 61.2 kg     Height: 5\' 2"  (1.575 m)      Constitutional: Resting supine in bed, NAD, calm, comfortable Eyes: PERRL, lids and conjunctivae normal ENMT: Mucous membranes are moist. Posterior pharynx clear of any exudate or lesions.Normal dentition.  Neck: normal, supple, no masses. Respiratory: clear to auscultation bilaterally, no wheezing, no crackles. Normal respiratory effort. No accessory muscle use.  Cardiovascular: Regular rate and rhythm, no murmurs / rubs / gallops. No extremity edema. 2+ pedal pulses. Abdomen: no tenderness, no masses palpated. No hepatosplenomegaly. Bowel sounds positive.  Musculoskeletal: no  clubbing / cyanosis. No joint deformity upper and lower extremities. Good ROM, no contractures. Normal muscle tone.  No tenderness to spinous processes. Skin: no rashes, lesions, ulcers. No induration Neurologic: CN 2-12 grossly intact. Sensation intact, Strength 5/5 in all 4.  Psychiatric: Normal judgment and insight. Alert and oriented x 3. Normal mood.     Labs on Admission: I have personally reviewed following labs and imaging studies  CBC: Recent Labs  Lab 06/14/19 1531  WBC 6.4  HGB 13.8  HCT 42.6  MCV 90.8  PLT [id number]   Basic Metabolic Panel: Recent Labs  Lab 06/14/19 1531  NA 140  K 2.5*  CL 96*  CO2 30  GLUCOSE 113*  BUN 9  CREATININE 0.48  CALCIUM 9.5  MG 2.1   GFR: Estimated Creatinine Clearance: 45.8 mL/min (by C-G formula based on SCr of 0.48 mg/dL). Liver Function Tests: No results for input(s): AST, ALT, ALKPHOS, BILITOT, PROT, ALBUMIN in the last 168 hours. No results for input(s): LIPASE, AMYLASE in the last 168 hours. No results for input(s): AMMONIA in the last 168 hours. Coagulation Profile: No results for input(s): INR, PROTIME in the last 168 hours. Cardiac Enzymes: No results for input(s): CKTOTAL, CKMB, CKMBINDEX, TROPONINI in the last 168 hours. BNP (last 3 results) No results for input(s): PROBNP in the last 8760 hours. HbA1C: No results for input(s): HGBA1C in the last 72 hours. CBG: No results for input(s): GLUCAP in the last 168 hours. Lipid Profile: No results for input(s): CHOL, HDL, LDLCALC, TRIG, CHOLHDL, LDLDIRECT in the last 72 hours. Thyroid Function Tests: No results for input(s): TSH, T4TOTAL, FREET4, T3FREE, THYROIDAB in the last 72 hours. Anemia Panel: No results for input(s): VITAMINB12, FOLATE, FERRITIN, TIBC, IRON, RETICCTPCT in the last 72 hours. Urine analysis:    Component Value Date/Time   COLORURINE YELLOW (A) 06/14/2019 1535   APPEARANCEUR HAZY (A) 06/14/2019 1535   APPEARANCEUR Cloudy (A) 12/20/2016 1304    LABSPEC 1.008 06/14/2019 1535   PHURINE 7.0 06/14/2019 1535   GLUCOSEU NEGATIVE 06/14/2019 1535   HGBUR NEGATIVE 06/14/2019 1535   BILIRUBINUR NEGATIVE 06/14/2019 1535   BILIRUBINUR Negative 12/20/2016 1304   KETONESUR 5 (A) 06/14/2019 1535   PROTEINUR NEGATIVE 06/14/2019 1535   NITRITE NEGATIVE 06/14/2019 1535   LEUKOCYTESUR NEGATIVE 06/14/2019 1535    Radiological Exams on Admission: CT Renal Stone Study  Result Date: 06/14/2019 CLINICAL DATA:  Bilateral flank pain EXAM: CT ABDOMEN AND PELVIS WITHOUT CONTRAST TECHNIQUE: Multidetector CT imaging of the abdomen and pelvis was performed following the standard protocol without IV contrast. COMPARISON:  04/21/2019 CT, 05/29/2019 MRI FINDINGS: Lower chest: No acute abnormality. Hepatobiliary: Stable small low-density lesions within the liver, likely cysts or hemangiomas. No new lesions. Gallbladder unremarkable. No gallstone. No biliary dilatation. Pancreas: Unremarkable. No pancreatic ductal dilatation or surrounding inflammatory changes. Spleen: Normal in size without focal abnormality. Adrenals/Urinary Tract: Stable 2.2 cm left adrenal lesion, likely benign adenoma. Right adrenal gland unremarkable. Bilateral kidneys are thin normal limits. No renal calculi. No hydronephrosis. Ureters are nondilated. Urinary bladder appears unremarkable. Stomach/Bowel: Small hiatal hernia. Stomach is within normal limits. Appendix appears normal. Scattered colonic diverticulosis. No evidence of bowel wall thickening, distention, or inflammatory changes. Vascular/Lymphatic: Scattered aortoiliac atherosclerosis. No acute vascular abnormality is identified. No abdominopelvic lymphadenopathy. Reproductive: Status post hysterectomy. No adnexal masses. Other: No abdominal wall hernia or abnormality. No abdominopelvic ascites. Musculoskeletal: Compression fractures of the T11 and T12 level status post cement augmentation. Similar appearance of bony retropulsion involving the  T12 vertebrae. Severe discogenic endplate changes at X33443 and L4-5, similar in appearance to prior. No new or acute osseous findings. IMPRESSION: 1. No CT findings to explain the patient's bilateral flank pain. Specifically, no evidence of obstructive uropathy. 2. Scattered colonic diverticulosis without findings to suggest acute diverticulitis. 3. Stable compression fractures of T11 and T12 status post cement augmentation. Similar appearance of bony retropulsion involving the T12 vertebrae. 4. Severe discogenic endplate changes at X33443 and L4-5, similar in appearance to prior study. Electronically Signed   By: Davina Poke D.O.   On: 06/14/2019 17:24    EKG: Independently reviewed. Sinus rhythm with PAC, LAD, wandering lead.  Not significantly changed from prior.  Assessment/Plan Principal Problem:   Hypokalemia Active Problems:   Hypertension  Barbara Vega is a 86 y.o. female with medical history significant for hypertension and degenerative disc disease s/p T12 kyphoplasty on 05/14/2020 and T11 kyphoplasty on 06/01/2019 who is admitted with hypokalemia and generalized weakness.  Hypokalemia: K 2.5 on admission, magnesium 2.1.  Give additional oral supplement and recheck in a.m.  Generalized weakness: Without focal neurological deficits.  PT/OT evaluation requested.  Hypertension: Continue amlodipine.  DDD s/p T12 and T11 kyphoplasty: Currently stable without evidence of acute issue.  DVT prophylaxis: Lovenox Code Status: DNR, confirmed with patient and daughter at bedside Family Communication: Discussed with daughter at bedside Disposition Plan: Likely discharge to home in 1 day Consults called: None Admission status: Observation   Zada Finders MD [facility]  If 7PM-7AM, please contact night-coverage [email or web address]  06/14/2019, 9:31 PM

## 2019-06-14 NOTE — ED Notes (Signed)
Pt helped to toilet, stand by assist, gait steady.

## 2019-06-14 NOTE — ED Provider Notes (Signed)
[facility] ([place]) Emergency Department Provider Note  Time seen: 4:46 PM  I have reviewed the triage vital signs and the nursing notes.   HISTORY  Chief Complaint Back Pain and Decreased appetite   HPI Barbara Vega is a 86 y.o. female with a past medical history of hypertension, arthritis, mild confusion, presents to the emergency department for generalized fatigue weakness back pain as well as confusion.  According to the daughter patient has had 2 recent back surgeries, continues to complain of back pain mostly in the middle of the back but occasionally off the left side as well.  Daughter states for the past several weeks the patient has been feeling unwell has been weak and somewhat somnolent, went to her PCP last week and was told to go the ER for blood work but they did not do so.  She states the patient has a history of low sodium and potassium at times.  Patient also states some mild dysuria recently.  Daughter is noted the patient's confusion is possibly slightly worse than baseline.  Currently the patient is awake alert, pleasant with no significant complaints at this time.   Past Medical History:  Diagnosis Date  . Adenomatous colon polyp   . Anemia    hx  . Arthritis   . Benign breast lumps   . History of colon polyps    benign  . Hypertension    takes Triamterene-HCTZ daily  . Insomnia    takes Restoril nightly  . Macular degeneration    dry  . Osteoporosis   . Sinus infection    taking Ceftin daily     Patient Active Problem List   Diagnosis Date Noted  . Pain due to onychomycosis of toenails of both feet 06/07/2019  . S/P kyphoplasty 06/01/2019  . DDD (degenerative disc disease), lumbar 06/16/2017  . SI (sacroiliac) joint dysfunction 06/16/2017  . Incarcerated left inguinal hernia   . Small bowel obstruction ([place])   . Hyponatremia 11/20/2016  . GERD (gastroesophageal reflux disease) 09/20/2016  . Dysphagia, unspecified 08/09/2016  .  Papilloma of breast 04/03/2015  . Osteoporosis, post-menopausal 03/04/2015  . Cannot sleep 03/04/2015  . BP (high blood pressure) 03/04/2015  . Adenomatous colon polyp 03/04/2015    Past Surgical History:  Procedure Laterality Date  . ABDOMINAL HYSTERECTOMY    . BREAST EXCISIONAL BIOPSY    . BREAST LUMPECTOMY Right 09/08/2015   intraductal papilloma  . BREAST LUMPECTOMY Left    2 bx done only one scar seen years ago neg  . cataract surgery Bilateral   . COLONOSCOPY    . COLONOSCOPY WITH PROPOFOL N/A 11/03/2016   Procedure: COLONOSCOPY WITH PROPOFOL;  Surgeon: Manya Silvas, MD;  Location: [facility] ENDOSCOPY;  Service: Endoscopy;  Laterality: N/A;  . ESOPHAGOGASTRODUODENOSCOPY (EGD) WITH PROPOFOL N/A 11/03/2016   Procedure: ESOPHAGOGASTRODUODENOSCOPY (EGD) WITH PROPOFOL;  Surgeon: Manya Silvas, MD;  Location: [facility] ENDOSCOPY;  Service: Endoscopy;  Laterality: N/A;  . INGUINAL HERNIA REPAIR Left 01/15/2017   Procedure: HERNIA REPAIR INGUINAL ADULT;  Surgeon: Florene Glen, MD;  Location: [facility] ORS;  Service: General;  Laterality: Left;  . KYPHOPLASTY N/A 05/15/2019   Procedure: KYPHOPLASTY T12;  Surgeon: Hessie Knows, MD;  Location: [facility] ORS;  Service: Orthopedics;  Laterality: N/A;  . KYPHOPLASTY N/A 06/01/2019   Procedure: T11 KYPHOPLASTY;  Surgeon: Hessie Knows, MD;  Location: [facility] ORS;  Service: Orthopedics;  Laterality: N/A;  . RADIOACTIVE SEED GUIDED EXCISIONAL BREAST BIOPSY Right 09/08/2015   Procedure: RADIOACTIVE  SEED GUIDED EXCISIONAL BREAST BIOPSY;  Surgeon: Rolm Bookbinder, MD;  Location: [place];  Service: General;  Laterality: Right;    Prior to Admission medications   Medication Sig Start Date End Date Taking? Authorizing Provider  acetaminophen (TYLENOL) 500 MG tablet Take 1,000 mg by mouth every 6 (six) hours as needed (pain).    [provider]  amLODipine (NORVASC) 2.5 MG tablet Take 2.5 mg by mouth daily. 11/06/18   [provider]  aspirin EC  81 MG tablet Take 81 mg by mouth daily.    [provider]  Bioflavonoid Products (SUPER C-500 PO) Take 1 tablet by mouth daily.    [provider]  calcium carbonate (OS-CAL - DOSED IN MG OF ELEMENTAL CALCIUM) 1250 (500 CA) MG tablet Take 1 tablet by mouth daily with breakfast.     [provider]  cetirizine (ZYRTEC) 10 MG tablet Take 10 mg by mouth daily as needed for allergies. 04/17/19   [provider]  cholecalciferol (VITAMIN D) 1000 UNITS tablet Take 1,000 Units by mouth daily.    [provider]  conjugated estrogens (PREMARIN) vaginal cream Place 1 Applicatorful vaginally daily. Use pea sized amount M-W-Fr before bedtime 01/26/19   Hollice Espy, MD  fluticasone [facility]) 50 MCG/ACT nasal spray Place 2 sprays into both nostrils daily as needed for allergies. 04/17/19   [provider]  gentamicin ointment (GARAMYCIN) 0.1 % gentamicin 0.1 % topical ointment    [provider]  HYDROcodone-acetaminophen (NORCO/VICODIN) 5-325 MG tablet Take 1 tablet by mouth every 8 (eight) hours as needed for severe pain. 06/02/19   Lattie Corns, PA-C  Influenza Vac High-Dose Quad (FLUZONE HIGH-DOSE QUADRIVALENT) 0.7 ML SUSY Fluzone High-Dose Quad 2020-21 (PF) 240 mcg/0.7 mL IM syringe  PHARMACY ADMINISTERED    [provider]  KRILL OIL PO Take 1 tablet by mouth daily.     [provider]  Lidocaine (LIDOCANNA) 4 % PTCH Place 1 patch onto the skin daily as needed (pain.).    [provider]  methylPREDNISolone (MEDROL DOSEPAK) 4 MG TBPK tablet Follow package directions. 05/21/19   [provider]  Multiple Vitamins-Minerals (PRESERVISION AREDS 2 PO) Take 1 tablet by mouth 2 (two) times daily.    [provider]  ondansetron (ZOFRAN-ODT) 4 MG disintegrating tablet ondansetron 4 mg disintegrating tablet 05/17/19   [provider]  phenazopyridine (PYRIDIUM) 100 MG tablet Take 1 tablet (100  mg total) by mouth 3 (three) times daily as needed for pain. 05/01/19 04/30/20  Rudene Re, MD  predniSONE (DELTASONE) 10 MG tablet Take by mouth. 05/30/19   [provider]  temazepam (RESTORIL) 30 MG capsule Take 30 mg by mouth at bedtime.  09/13/14   [provider]  traMADol (ULTRAM) 50 MG tablet Take 1 tablet (50 mg total) by mouth every 6 (six) hours as needed for severe pain. 04/21/19   Delman Kitten, MD  traMADol (ULTRAM) 50 MG tablet Take 1 tablet (50 mg total) by mouth every 6 (six) hours as needed. 05/15/19 05/14/20  Hessie Knows, MD    Allergies  Allergen Reactions  . Codone [Hydrocodone] Nausea And Vomiting    Family History  Problem Relation Age of Onset  . Osteoporosis Mother   . Heart failure Mother   . Heart block Brother   . Alcohol abuse Brother   . Bladder Cancer Neg Hx   . Kidney cancer Neg Hx     Social History Social History   Tobacco Use  .  Smoking status: Never Smoker  . Smokeless tobacco: Never Used  Substance Use Topics  . Alcohol use: No    Alcohol/week: 0.0 standard drinks  . Drug use: No    Review of Systems Constitutional: Negative for fever.  Positive for generalized fatigue Cardiovascular: Negative for chest pain. Respiratory: Negative for shortness of breath. Gastrointestinal: Negative for abdominal pain Genitourinary: Mild dysuria. Musculoskeletal: Positive for lower back pain. Skin: Negative for skin complaints  Neurological: Negative for headache All other ROS negative  ____________________________________________   PHYSICAL EXAM:  VITAL SIGNS: ED Triage Vitals [06/14/19 1527]  Enc Vitals Group     BP (!) 180/60     Pulse Rate 75     Resp 18     Temp 98.4 F (36.9 C)     Temp Source Oral     SpO2 97 %     Weight 135 lb (61.2 kg)     Height 5\' 2"  (1.575 m)     Head Circumference      Peak Flow      Pain Score 8     Pain Loc      Pain Edu?      Excl. in [place]?    Constitutional: Alert. Well  appearing and in no distress. Eyes: Normal exam ENT      Head: Normocephalic and atraumatic.      Mouth/Throat: Mucous membranes are moist. Cardiovascular: Normal rate, regular rhythm.  Respiratory: Normal respiratory effort without tachypnea nor retractions. Breath sounds are clear  Gastrointestinal: Soft and nontender. No distention. Musculoskeletal: Nontender with normal range of motion in all extremities.  No CT or L-spine tenderness. Neurologic:  Normal speech and language. No gross focal neurologic deficits  Skin:  Skin is warm, dry and intact.  Psychiatric: Mood and affect are normal.   ____________________________________________    EKG  EKG viewed and interpreted by myself shows a normal sinus rhythm at 71 bpm with a narrow QRS, normal axis, largely normal intervals with nonspecific ST changes  ____________________________________________    RADIOLOGY  IMPRESSION:  1. No CT findings to explain the patient's bilateral flank pain.  Specifically, no evidence of obstructive uropathy.  2. Scattered colonic diverticulosis without findings to suggest  acute diverticulitis.  3. Stable compression fractures of T11 and T12 status post cement  augmentation. Similar appearance of bony retropulsion involving the  T12 vertebrae.  4. Severe discogenic endplate changes at X33443 and L4-5, similar in  appearance to prior study.   ____________________________________________   INITIAL IMPRESSION / ASSESSMENT AND PLAN / ED COURSE  Pertinent labs & imaging results that were available during my care of the patient were reviewed by me and considered in my medical decision making (see chart for details).   Patient presents to the emergency department with complaints of generalized fatigue daughter states some increased confusion, lower back pain and dysuria.  Differential is quite broad at this time, lab work has resulted showing hypokalemia, we will replete both orally and with IV  potassium we will also IV hydrate.  Urinalysis is pending, given the patient's complaint of back pain as well as left flank pain we will proceed with a CT renal scan to further evaluate.  I have also added on a magnesium level.  Patient daughter agreeable to plan of care.  Patient's magnesium is normal.  CT shows no concerning acute findings.  However given the patient's weakness and hypokalemia we will admit to the hospitalist service for further treatment.  Patient and daughter agreeable.  Barbara Vega was evaluated in Emergency Department on 06/14/2019 for the symptoms described in the history of present illness. She was evaluated in the context of the global COVID-19 pandemic, which necessitated consideration that the patient might be at risk for infection with the SARS-CoV-2 virus that causes COVID-19. Institutional protocols and algorithms that pertain to the evaluation of patients at risk for COVID-19 are in a state of rapid change based on information released by regulatory bodies including the CDC and federal and state organizations. These policies and algorithms were followed during the patient's care in the ED.  ____________________________________________   FINAL CLINICAL IMPRESSION(S) / ED DIAGNOSES  Hypokalemia Weakness   Harvest Dark, MD 06/14/19 Kathyrn Drown

## 2019-06-14 NOTE — ED Notes (Addendum)
Pt repeatedly asking to lay down in triage and states that she is "freezing." This RN wrapped her with warm blanket x2. Pt answers questions appropriately, however, very forgetful, asking the same questions to this RN over and over. Denies use of any pain meds today.

## 2019-06-14 NOTE — ED Triage Notes (Signed)
Pt admits to some burning with urination recently. Pain on left flank at this time.

## 2019-06-14 NOTE — ED Triage Notes (Signed)
First Nurse Note:  Patient brought to the hospital by granddaughter in law.  She states patient has had 2 back surgeries in the past 8 weeks and has been increasingly confused and having severe pain.  Patient has also been losing weight.  Granddaughter states patient has some undiagnosed dementia but seems much worse.  Patient's daughter is on her way to be with patient.

## 2019-06-14 NOTE — ED Notes (Signed)
Attempted to call report to [facility] on hold for several minutes.

## 2019-06-15 ENCOUNTER — Other Ambulatory Visit: Payer: Self-pay

## 2019-06-15 ENCOUNTER — Encounter: Payer: Self-pay | Admitting: Internal Medicine

## 2019-06-15 DIAGNOSIS — R531 Weakness: Secondary | ICD-10-CM

## 2019-06-15 DIAGNOSIS — E876 Hypokalemia: Secondary | ICD-10-CM | POA: Diagnosis not present

## 2019-06-15 LAB — BASIC METABOLIC PANEL
Anion gap: 7 (ref 5–15)
BUN: <5 mg/dL — ABNORMAL LOW (ref 8–23)
CO2: 26 mmol/L (ref 22–32)
Calcium: 8.3 mg/dL — ABNORMAL LOW (ref 8.9–10.3)
Chloride: 104 mmol/L (ref 98–111)
Creatinine, Ser: 0.37 mg/dL — ABNORMAL LOW (ref 0.44–1.00)
GFR calc Af Amer: >60 mL/min (ref >60)
GFR calc non Af Amer: >60 mL/min (ref >60)
Glucose, Bld: 98 mg/dL (ref 70–99)
Potassium: 3.8 mmol/L (ref 3.5–5.1)
Sodium: 137 mmol/L (ref 135–145)

## 2019-06-15 LAB — CBC
HCT: 36.7 % (ref 36.0–46.0)
Hemoglobin: 12.2 g/dL (ref 12.0–15.0)
MCH: 30.2 pg (ref 26.0–34.0)
MCHC: 33.2 g/dL (ref 30.0–36.0)
MCV: 90.8 fL (ref 80.0–100.0)
Platelets: 370 10*3/uL (ref 150–400)
RBC: 4.04 MIL/uL (ref 3.87–5.11)
RDW: 15.5 % (ref 11.5–15.5)
WBC: 6.9 10*3/uL (ref 4.0–10.5)
nRBC: 0 % (ref 0.0–0.2)

## 2019-06-15 MED ORDER — ENSURE ENLIVE PO LIQD: 237.0000 mL | Freq: Two times a day (BID) | ORAL | 12 refills | Status: DC

## 2019-06-15 MED ORDER — TRAMADOL HCL 50 MG PO TABS: 50.0000 mg | ORAL_TABLET | Freq: Two times a day (BID) | ORAL | Status: DC | PRN

## 2019-06-15 MED ORDER — HYDROCODONE-ACETAMINOPHEN 5-325 MG PO TABS
1.0000 | ORAL_TABLET | Freq: Four times a day (QID) | ORAL | Status: DC | PRN
  Administered 2019-06-15 (×2): 1 via ORAL
  Filled 2019-06-15 (×2): qty 1

## 2019-06-15 MED ORDER — CYCLOBENZAPRINE HCL 5 MG PO TABS
2.5000 mg | ORAL_TABLET | Freq: Two times a day (BID) | ORAL | Status: DC | PRN
  Filled 2019-06-15: qty 0.5

## 2019-06-15 MED ORDER — ENSURE ENLIVE PO LIQD
237.0000 mL | Freq: Two times a day (BID) | ORAL | Status: DC
  Administered 2019-06-15 (×2): 237 mL via ORAL

## 2019-06-15 MED ORDER — CYCLOBENZAPRINE HCL 5 MG PO TABS: 2.5000 mg | ORAL_TABLET | Freq: Two times a day (BID) | ORAL | 0 refills | Status: DC | PRN

## 2019-06-15 NOTE — Discharge Instructions (Signed)
Pt's daughter advised to d/w PCP regarding Neurology evaluation as out pt for possible Dementia

## 2019-06-15 NOTE — Progress Notes (Signed)
OT Cancellation Note  Patient Details Name: Barbara Vega MRN: [id number] DOB: 1933-12-08   Cancelled Treatment:    Reason Eval/Treat Not Completed: OT screened, no needs identified, will sign off. Thank you for the OT consult. Order received and chart reviewed. Per conversation with pt physical therapist, pt doing well with functional mobility. Appears at or near baseline for functional independence. No acute skilled OT needs identified Will sign off at this time. Please re-consult if additional OT needs arise during this hospital stay.   Shara Blazing, M.S., OTR/L Ascom: [phone] 06/15/19, 11:33 AM

## 2019-06-15 NOTE — Evaluation (Signed)
Physical Therapy Evaluation Patient Details Name: Barbara Vega MRN: [id number] DOB: 12-Feb-1934 Today's Date: 06/15/2019   History of Present Illness  51yoF s/p T11 kyphoplasty 1/15 (T12 kypho last month). She has been weak since going home and came in with hypokalemia which is resolved at time of PT exam.  Clinical Impression  Pt did well with PT exam and subsequent gait training.  She showed very little hesitancy with mobility, transfers, ambulation, stair negotiation.  She did hold L flank much of the time and c/o pain that has been present since recent kyphoplasties (T11 and T12).  She reports that she has been able to manage at home (until this recent weakness) and that family has been assisting with errands, etc.  Pt confident that she can manage at home, has necessary help. Some mild impulsivity, but generally safe and appropriate functionally.  Follow Up Recommendations No PT follow up    Equipment Recommendations  None recommended by PT(did encourage pt to use some AD 2/2 flank pain)    Recommendations for Other Services       Precautions / Restrictions Restrictions Weight Bearing Restrictions: No      Mobility  Bed Mobility Overal bed mobility: Independent             General bed mobility comments: Pt able to get herself to sitting EOB w/o assist  Transfers Overall transfer level: Modified independent Equipment used: None Transfers: Sit to/from Stand Sit to Stand: Supervision         General transfer comment: Pt able to rise w/o hesitation, inconsistent about following cuing for safety awareness, etc  Ambulation/Gait Ambulation/Gait assistance: Supervision Gait Distance (Feet): 300 Feet Assistive device: None       General Gait Details: Pt was able to ambulate around the hallways with good confidence, though she was holding L flank most of the time with some very mild (seemingly related) limp.  She reports that has been the case since kyphos.  Pt with no  LOBs, consistent speed and generally good confidence and safety.   Stairs Stairs: Yes Stairs assistance: Supervision Stair Management: One rail Right Number of Stairs: 6 General stair comments: Pt was able to negotiate up/down steps reciprocally, though less confident and UE reliant while ascending with L.  Educated on pros/cons of step-to strategy  Wheelchair Mobility    Modified Rankin (Stroke Patients Only)       Balance Overall balance assessment: Mild deficits observed, not formally tested;Modified Independent                                           Pertinent Vitals/Pain Pain Assessment: No/denies pain Pain Score: 4  Pain Location: (L flank, hip, quadratus pain)    Home Living Family/patient expects to be discharged to:: Private residence Living Arrangements: Alone Available Help at Discharge: Family;Available PRN/intermittently Type of Home: House Home Access: Stairs to enter Entrance Stairs-Rails: Right Entrance Stairs-Number of Steps: 2   Home Equipment: Piney - 2 wheels;Cane - single point      Prior Function Level of Independence: Independent         Comments: lives alone, formerly AMB without limitation, no use of device, no recent falls history in the past 3 months.     Hand Dominance        Extremity/Trunk Assessment   Upper Extremity Assessment Upper Extremity Assessment: Generalized weakness;Overall Barbara Vega for tasks  assessed    Lower Extremity Assessment Lower Extremity Assessment: Generalized weakness;Overall WFL for tasks assessed       Communication   Communication: No difficulties  Cognition Arousal/Alertness: Awake/alert Behavior During Therapy: WFL for tasks assessed/performed Overall Cognitive Status: Within Functional Limits for tasks assessed                                 General Comments: Pt alert, conversational, but often tangential during questioning, rarely providing answers to question  asked.      General Comments      Exercises     Assessment/Plan    PT Assessment Patent does not need any further PT services  PT Problem List         PT Treatment Interventions      PT Goals (Current goals can be found in the Care Plan section)  Acute Rehab PT Goals Patient Stated Goal: go home PT Goal Formulation: All assessment and education complete, DC therapy    Frequency     Barriers to discharge        Co-evaluation               AM-PAC PT "6 Clicks" Mobility  Outcome Measure Help needed turning from your back to your side while in a flat bed without using bedrails?: None Help needed moving from lying on your back to sitting on the side of a flat bed without using bedrails?: None Help needed moving to and from a bed to a chair (including a wheelchair)?: None Help needed standing up from a chair using your arms (e.g., wheelchair or bedside chair)?: None Help needed to walk in hospital room?: None Help needed climbing 3-5 steps with a railing? : None 6 Click Score: 24    End of Session Equipment Utilized During Treatment: Gait belt Activity Tolerance: Patient tolerated treatment well;Patient limited by pain Patient left: with call bell/phone within reach;with chair alarm set   PT Visit Diagnosis: Other abnormalities of gait and mobility (R26.89);Difficulty in walking, not elsewhere classified (R26.2);Ataxic gait (R26.0)    Time: [id number] PT Time Calculation (min) (ACUTE ONLY): 20 min   Charges:   PT Evaluation $PT Eval Low Complexity: 1 Low PT Treatments $Gait Training: 8-22 mins        Kreg Shropshire, DPT 06/15/2019, 10:42 AM

## 2019-06-15 NOTE — Care Management Obs Status (Signed)
[facility] NOTIFICATION   Patient Details  Name: Barbara Vega MRN: [id number] Date of Birth: 10-25-1933   Medicare Observation Status Notification Given:  No(admitted obs less than 24 hours)    Beverly Sessions, RN 06/15/2019, 11:05 AM

## 2019-06-15 NOTE — Discharge Summary (Signed)
[name]    MR#:  [id number]  DATE OF BIRTH:  1933/08/27  DATE OF ADMISSION:  06/14/2019 ADMITTING PHYSICIAN: Lenore Cordia, MD  DATE OF DISCHARGE: 06/15/2019  PRIMARY CARE PHYSICIAN: [name], MD    ADMISSION DIAGNOSIS:  Hypokalemia [E87.6] Weakness [R53.1]  DISCHARGE DIAGNOSIS:  Hypokalemia suspected with poor po intake--repleted  SECONDARY DIAGNOSIS:   Past Medical History:  Diagnosis Date  . Adenomatous colon polyp   . Anemia    hx  . Arthritis   . Benign breast lumps   . History of colon polyps    benign  . Hypertension    takes Triamterene-HCTZ daily  . Insomnia    takes Restoril nightly  . Macular degeneration    dry  . Osteoporosis   . Sinus infection    taking Ceftin daily     HOSPITAL COURSE:  Barbara Vega is a 86 y.o. female with medical history significant for hypertension and degenerative disc disease s/p T12 kyphoplasty on 05/14/2020 and T11 kyphoplasty on 06/01/2019 who is admitted with hypokalemia and generalized weakness.  Hypokalemia: K 2.5 on admission, magnesium 2.1.   -Give additional oral supplement and IV KCL -K 3.8  Generalized weakness: Without focal neurological deficits.   -PT evaluation appreciated. Per PT pt ambulated well and did stairs also. No PT needs at home   Hypertension: Continue amlodipine.  DDD s/p T12 and T11 kyphoplasty in Jan 2021: Currently stable without evidence of acute issue. -prn low dose flexeril -f/u dr Rudene Christians per your appt  GERD -pt recommended cont PPI, ensure atleast twice a day and eat small meals in between.  Overall at baseline. D/c home today. dter [name] agreeable.  D/w dr to touchbase with PCP for Dementia evaluation as out pt   DVT prophylaxis: Lovenox Code Status: DNR per dter Family Communication: Discussed with daughter [name] over the phone Disposition Plan: home today Consults called: None Admission  status: Observation  CONSULTS OBTAINED:    DRUG ALLERGIES:   Allergies  Allergen Reactions  . Codone [Hydrocodone] Nausea And Vomiting    DISCHARGE MEDICATIONS:   Allergies as of 06/15/2019      Reactions   Codone [hydrocodone] Nausea And Vomiting      Medication List    STOP taking these medications   HYDROcodone-acetaminophen 5-325 MG tablet Commonly known as: NORCO/VICODIN     TAKE these medications   amLODipine 2.5 MG tablet Commonly known as: NORVASC Take 2.5 mg by mouth daily.   cetirizine 10 MG tablet Commonly known as: ZYRTEC Take 10 mg by mouth daily as needed for allergies.   cyclobenzaprine 5 MG tablet Commonly known as: FLEXERIL Take 0.5 tablets (2.5 mg total) by mouth 2 (two) times daily as needed for muscle spasms.   diphenhydramine-acetaminophen 25-500 MG Tabs tablet Commonly known as: TYLENOL PM Take 1-2 tablets by mouth at bedtime.   docusate sodium 100 MG capsule Commonly known as: COLACE Take 100 mg by mouth at bedtime as needed for mild constipation.   feeding supplement (ENSURE ENLIVE) Liqd Take 237 mLs by mouth 2 (two) times daily between meals.   fluticasone 50 MCG/ACT nasal spray Commonly known as: FLONASE Place 2 sprays into both nostrils daily as needed for allergies.   omeprazole 20 MG tablet Commonly known as: PRILOSEC OTC Take 20 mg by mouth at bedtime.   phenazopyridine 100 MG tablet Commonly known as: PYRIDIUM Take 1 tablet (100 mg total) by  mouth 3 (three) times daily as needed for pain.   Premarin vaginal cream Generic drug: conjugated estrogens Place 1 Applicatorful vaginally daily. Use pea sized amount M-W-Fr before bedtime   temazepam 30 MG capsule Commonly known as: RESTORIL Take 30 mg by mouth at bedtime.       If you experience worsening of your admission symptoms, develop shortness of breath, life threatening emergency, suicidal or homicidal thoughts you must seek medical attention immediately by calling  911 or calling your MD immediately  if symptoms less severe.  You Must read complete instructions/literature along with all the possible adverse reactions/side effects for all the Medicines you take and that have been prescribed to you. Take any new Medicines after you have completely understood and accept all the possible adverse reactions/side effects.   Please note  You were cared for by a hospitalist during your hospital stay. If you have any questions about your discharge medications or the care you received while you were in the hospital after you are discharged, you can call the unit and asked to speak with the hospitalist on call if the hospitalist that took care of you is not available. Once you are discharged, your primary care physician will handle any further medical issues. Please note that NO REFILLS for any discharge medications will be authorized once you are discharged, as it is imperative that you return to your primary care physician (or establish a relationship with a primary care physician if you do not have one) for your aftercare needs so that they can reassess your need for medications and monitor your lab values. Today   SUBJECTIVE   Ate little, some back pain. Drinking ensure. ambulated with PT  VITAL SIGNS:  Blood pressure 134/65, pulse 85, temperature 98.2 F (36.8 C), temperature source Oral, resp. rate 16, height 5\' 2"  (1.575 m), weight 61.2 kg, SpO2 97 %.  I/O:    Intake/Output Summary (Last 24 hours) at 06/15/2019 1053 Last data filed at 06/14/2019 2119 Gross per 24 hour  Intake 1100 ml  Output --  Net 1100 ml    PHYSICAL EXAMINATION:  GENERAL:  86 y.o.-year-old patient lying in the bed with no acute distress.  EYES: Pupils equal, round, reactive to light and accommodation. No scleral icterus.  HEENT: Head atraumatic, normocephalic. Oropharynx and nasopharynx clear.  NECK:  Supple, no jugular venous distention. No thyroid enlargement, no tenderness.   LUNGS: Normal breath sounds bilaterally, no wheezing, rales,rhonchi or crepitation. No use of accessory muscles of respiration.  CARDIOVASCULAR: S1, S2 normal. No murmurs, rubs, or gallops.  ABDOMEN: Soft, non-tender, non-distended. Bowel sounds present. No organomegaly or mass.  EXTREMITIES: No pedal edema, cyanosis, or clubbing.  NEUROLOGIC: Cranial nerves II through XII are intact. Muscle strength 5/5 in all extremities. Sensation intact. Gait not checked.  PSYCHIATRIC: The patient is alert and oriented x 3.  SKIN: No obvious rash, lesion, or ulcer.   DATA REVIEW:   CBC  Recent Labs  Lab 06/15/19 0745  WBC 6.9  HGB 12.2  HCT 36.7  PLT 370    Chemistries  Recent Labs  Lab 06/14/19 1531 06/14/19 1531 06/15/19 0745  NA 140   < > 137  K 2.5*   < > 3.8  CL 96*   < > 104  CO2 30   < > 26  GLUCOSE 113*   < > 98  BUN 9   < > <5*  CREATININE 0.48   < > 0.37*  CALCIUM 9.5   < >  8.3*  MG 2.1  --   --    < > = values in this interval not displayed.    Microbiology Results   Recent Results (from the past 240 hour(s))  Respiratory Panel by RT PCR (Flu A&B, Covid) - Nasopharyngeal Swab     Status: None   Collection Time: 06/14/19  7:25 PM   Specimen: Nasopharyngeal Swab  Result Value Ref Range Status   SARS Coronavirus 2 by RT PCR NEGATIVE NEGATIVE Final    Comment: (NOTE) SARS-CoV-2 target nucleic acids are NOT DETECTED. The SARS-CoV-2 RNA is generally detectable in upper respiratoy specimens during the acute phase of infection. The lowest concentration of SARS-CoV-2 viral copies this assay can detect is 131 copies/mL. A negative result does not preclude SARS-Cov-2 infection and should not be used as the sole basis for treatment or other patient management decisions. A negative result may occur with  improper specimen collection/handling, submission of specimen other than nasopharyngeal swab, presence of viral mutation(s) within the areas targeted by this assay, and  inadequate number of viral copies (<131 copies/mL). A negative result must be combined with clinical observations, patient history, and epidemiological information. The expected result is Negative. Fact Sheet for Patients:  PinkCheek.be Fact Sheet for Healthcare Providers:  GravelBags.it This test is not yet ap proved or cleared by the [place] FDA and  has been authorized for detection and/or diagnosis of SARS-CoV-2 by FDA under an Emergency Use Authorization (EUA). This EUA will remain  in effect (meaning this test can be used) for the duration of the COVID-19 declaration under Section 564(b)(1) of the Act, 21 U.S.C. section 360bbb-3(b)(1), unless the authorization is terminated or revoked sooner.    Influenza A by PCR NEGATIVE NEGATIVE Final   Influenza B by PCR NEGATIVE NEGATIVE Final    Comment: (NOTE) The Xpert Xpress SARS-CoV-2/FLU/RSV assay is intended as an aid in  the diagnosis of influenza from Nasopharyngeal swab specimens and  should not be used as a sole basis for treatment. Nasal washings and  aspirates are unacceptable for Xpert Xpress SARS-CoV-2/FLU/RSV  testing. Fact Sheet for Patients: PinkCheek.be Fact Sheet for Healthcare Providers: GravelBags.it This test is not yet approved or cleared by the [place] FDA and  has been authorized for detection and/or diagnosis of SARS-CoV-2 by  FDA under an Emergency Use Authorization (EUA). This EUA will remain  in effect (meaning this test can be used) for the duration of the  Covid-19 declaration under Section 564(b)(1) of the Act, 21  U.S.C. section 360bbb-3(b)(1), unless the authorization is  terminated or revoked. Performed at [facility], [address] [place], [place] [address]     RADIOLOGY:  CT Renal Stone Study  Result Date: 06/14/2019 CLINICAL DATA:  Bilateral flank pain  EXAM: CT ABDOMEN AND PELVIS WITHOUT CONTRAST TECHNIQUE: Multidetector CT imaging of the abdomen and pelvis was performed following the standard protocol without IV contrast. COMPARISON:  04/21/2019 CT, 05/29/2019 MRI FINDINGS: Lower chest: No acute abnormality. Hepatobiliary: Stable small low-density lesions within the liver, likely cysts or hemangiomas. No new lesions. Gallbladder unremarkable. No gallstone. No biliary dilatation. Pancreas: Unremarkable. No pancreatic ductal dilatation or surrounding inflammatory changes. Spleen: Normal in size without focal abnormality. Adrenals/Urinary Tract: Stable 2.2 cm left adrenal lesion, likely benign adenoma. Right adrenal gland unremarkable. Bilateral kidneys are thin normal limits. No renal calculi. No hydronephrosis. Ureters are nondilated. Urinary bladder appears unremarkable. Stomach/Bowel: Small hiatal hernia. Stomach is within normal limits. Appendix appears normal. Scattered colonic diverticulosis. No  evidence of bowel wall thickening, distention, or inflammatory changes. Vascular/Lymphatic: Scattered aortoiliac atherosclerosis. No acute vascular abnormality is identified. No abdominopelvic lymphadenopathy. Reproductive: Status post hysterectomy. No adnexal masses. Other: No abdominal wall hernia or abnormality. No abdominopelvic ascites. Musculoskeletal: Compression fractures of the T11 and T12 level status post cement augmentation. Similar appearance of bony retropulsion involving the T12 vertebrae. Severe discogenic endplate changes at [id number] and L4-5, similar in appearance to prior. No new or acute osseous findings. IMPRESSION: 1. No CT findings to explain the patient's bilateral flank pain. Specifically, no evidence of obstructive uropathy. 2. Scattered colonic diverticulosis without findings to suggest acute diverticulitis. 3. Stable compression fractures of T11 and T12 status post cement augmentation. Similar appearance of bony retropulsion involving the T12  vertebrae. 4. Severe discogenic endplate changes at [id number] and L4-5, similar in appearance to prior study. Electronically Signed   By: Davina Poke D.O.   On: 06/14/2019 17:24     CODE STATUS:     Code Status Orders  (From admission, onward)         Start     Ordered   06/14/19 2044  Do not attempt resuscitation (DNR)  Continuous    Question Answer Comment  In the event of cardiac or respiratory ARREST Do not call a "code blue"   In the event of cardiac or respiratory ARREST Do not perform Intubation, CPR, defibrillation or ACLS   In the event of cardiac or respiratory ARREST Use medication by any route, position, wound care, and other measures to relive pain and suffering. May use oxygen, suction and manual treatment of airway obstruction as needed for comfort.      06/14/19 2044        Code Status History    Date Active Date Inactive Code Status Order ID Comments User Context   06/01/2019 1544 06/02/2019 1700 Full Code [id number]  Hessie Knows, MD Inpatient   06/01/2019 1544 06/01/2019 1544 Full Code [id number]  Hessie Knows, MD Inpatient   05/15/2019 1506 05/15/2019 2144 Full Code [id number]  Hessie Knows, MD Inpatient   02/05/2017 1142 02/06/2017 1540 Full Code [id number]  Henreitta Leber, MD Inpatient   01/15/2017 0851 01/16/2017 1431 Full Code [id number]  Florene Glen, MD Inpatient   11/20/2016 0824 11/21/2016 1643 Full Code [id number]  Loletha Grayer, MD ED   Advance Care Planning Activity    Advance Directive Documentation     Most Recent Value  Type of Advance Directive  Healthcare Power of Attorney, Living will  Pre-existing out of facility DNR order (yellow form or pink MOST form)  --  "MOST" Form in Place?  --       TOTAL TIME TAKING CARE OF THIS PATIENT: *35* minutes.    Fritzi Mandes M.D  [facility]    CC: Primary care physician; [name], MD

## 2019-06-15 NOTE — ED Notes (Signed)
Pt transported to room 211

## 2019-06-15 NOTE — Plan of Care (Signed)
The patient has been discharged. IV removed. Education completed and teach back method utilized upon discharge. Problem: Education: Goal: Knowledge of General Education information will improve Description: Including pain rating scale, medication(s)/side effects and non-pharmacologic comfort measures Outcome: Completed/Met   Problem: Health Behavior/Discharge Planning: Goal: Ability to manage health-related needs will improve Outcome: Completed/Met   Problem: Clinical Measurements: Goal: Ability to maintain clinical measurements within normal limits will improve Outcome: Completed/Met Goal: Will remain free from infection Outcome: Completed/Met Goal: Diagnostic test results will improve Outcome: Completed/Met Goal: Respiratory complications will improve Outcome: Completed/Met Goal: Cardiovascular complication will be avoided Outcome: Completed/Met   Problem: Activity: Goal: Risk for activity intolerance will decrease Outcome: Completed/Met   Problem: Nutrition: Goal: Adequate nutrition will be maintained Outcome: Completed/Met   Problem: Coping: Goal: Level of anxiety will decrease Outcome: Completed/Met   Problem: Elimination: Goal: Will not experience complications related to bowel motility Outcome: Completed/Met Goal: Will not experience complications related to urinary retention Outcome: Completed/Met   Problem: Pain Managment: Goal: General experience of comfort will improve Outcome: Completed/Met   Problem: Safety: Goal: Ability to remain free from injury will improve Outcome: Completed/Met   Problem: Skin Integrity: Goal: Risk for impaired skin integrity will decrease Outcome: Completed/Met

## 2019-08-02 ENCOUNTER — Other Ambulatory Visit: Payer: Self-pay

## 2019-08-02 ENCOUNTER — Ambulatory Visit: Payer: Medicare Other | Admitting: Podiatry

## 2019-08-02 ENCOUNTER — Encounter: Payer: Self-pay | Admitting: Podiatry

## 2019-08-02 VITALS — Temp 98.2°F

## 2019-08-02 DIAGNOSIS — M79675 Pain in left toe(s): Secondary | ICD-10-CM

## 2019-08-02 DIAGNOSIS — L600 Ingrowing nail: Secondary | ICD-10-CM | POA: Diagnosis not present

## 2019-08-02 DIAGNOSIS — L603 Nail dystrophy: Secondary | ICD-10-CM

## 2019-08-03 ENCOUNTER — Encounter: Payer: Self-pay | Admitting: Podiatry

## 2019-08-03 NOTE — Progress Notes (Signed)
Subjective:  Patient ID: Barbara Vega, female    DOB: 07/12/1933,  MRN: [id number]  Chief Complaint  Patient presents with  . Nail Problem    left great toenail is pink, swollen painful since 2-3 weeks,around border of the nail    86 y.o. female presents with the above complaint.  Patient presents with left lateral ingrown that has been causing her a lot of pain.  It has been going for 2 to 3 weeks and progressive gotten worse.  She has not had previous treatment of ingrown.  She is known to Dr. Prudence Davidson who does his regular routine care.  She denies any other clinical signs of infection associated with it.  She has tried some self debridement which has not helped.   Review of Systems: Negative except as noted in the HPI. Denies N/V/F/Ch.  Past Medical History:  Diagnosis Date  . Adenomatous colon polyp   . Anemia    hx  . Arthritis   . Benign breast lumps   . History of colon polyps    benign  . Hypertension    takes Triamterene-HCTZ daily  . Insomnia    takes Restoril nightly  . Macular degeneration    dry  . Osteoporosis   . Sinus infection    taking Ceftin daily     Current Outpatient Medications:  .  amLODipine (NORVASC) 2.5 MG tablet, , Disp: , Rfl:  .  cetirizine (ZYRTEC) 10 MG tablet, Take 10 mg by mouth daily as needed for allergies., Disp: , Rfl:  .  conjugated estrogens (PREMARIN) vaginal cream, Place 1 Applicatorful vaginally daily. Use pea sized amount M-W-Fr before bedtime, Disp: 42.5 g, Rfl: 12 .  cyclobenzaprine (FLEXERIL) 5 MG tablet, Take 0.5 tablets (2.5 mg total) by mouth 2 (two) times daily as needed for muscle spasms., Disp: 20 tablet, Rfl: 0 .  diphenhydramine-acetaminophen (TYLENOL PM) 25-500 MG TABS tablet, Take 1-2 tablets by mouth at bedtime., Disp: , Rfl:  .  docusate sodium (COLACE) 100 MG capsule, Take 100 mg by mouth at bedtime as needed for mild constipation., Disp: , Rfl:  .  feeding supplement, ENSURE ENLIVE, (ENSURE ENLIVE) LIQD, Take 237 mLs  by mouth 2 (two) times daily between meals., Disp: 237 mL, Rfl: 12 .  fluticasone (FLONASE) 50 MCG/ACT nasal spray, Place 2 sprays into both nostrils daily as needed for allergies., Disp: , Rfl:  .  omeprazole (PRILOSEC OTC) 20 MG tablet, Take 20 mg by mouth at bedtime., Disp: , Rfl:  .  phenazopyridine (PYRIDIUM) 100 MG tablet, Take 1 tablet (100 mg total) by mouth 3 (three) times daily as needed for pain., Disp: 20 tablet, Rfl: 0 .  predniSONE (DELTASONE) 10 MG tablet, , Disp: , Rfl:  .  temazepam (RESTORIL) 30 MG capsule, Take 30 mg by mouth at bedtime. , Disp: , Rfl:   Social History   Tobacco Use  Smoking Status Never Smoker  Smokeless Tobacco Never Used    Allergies  Allergen Reactions  . Codone [Hydrocodone] Nausea And Vomiting   Objective:   Vitals:   08/02/19 1406  Temp: 98.2 F (36.8 C)   There is no height or weight on file to calculate BMI. Constitutional Well developed. Well nourished.  Vascular Dorsalis pedis pulses palpable bilaterally. Posterior tibial pulses palpable bilaterally. Capillary refill normal to all digits.  No cyanosis or clubbing noted. Pedal hair growth normal.  Neurologic Normal speech. Oriented to person, place, and time. Epicritic sensation to light touch grossly present bilaterally.  Dermatologic Painful ingrowing nail at lateral nail borders of the hallux nail left. No other open wounds. No skin lesions.  Orthopedic: Normal joint ROM without pain or crepitus bilaterally. No visible deformities. No bony tenderness.   Radiographs: None Assessment:   1. Ingrown left big toenail   2. Pain of left great toe    Plan:  Patient was evaluated and treated and all questions answered.  Ingrown Nail, left/nail dystrophy -Patient elects to proceed with minor surgery to remove ingrown toenail removal today. Consent reviewed and signed by patient. -Ingrown nail excised. See procedure note. -Educated on post-procedure care including soaking.  Written instructions provided and reviewed. -Patient to follow up in 2 weeks for nail check.  Procedure: Excision of Ingrown Toenail Location: Left 1st toe lateral nail borders. Anesthesia: Lidocaine 1% plain; 1.5 mL and Marcaine 0.5% plain; 1.5 mL, digital block. Skin Prep: Betadine. Dressing: Silvadene; telfa; dry, sterile, compression dressing. Technique: Following skin prep, the toe was exsanguinated and a tourniquet was secured at the base of the toe. The affected nail border was freed, split with a nail splitter, and excised. Chemical matrixectomy was then performed with phenol and irrigated out with alcohol. The tourniquet was then removed and sterile dressing applied. Disposition: Patient tolerated procedure well. Patient to return in 2 weeks for follow-up.   No follow-ups on file.

## 2019-08-08 ENCOUNTER — Other Ambulatory Visit: Payer: Self-pay | Admitting: Acute Care

## 2019-08-08 DIAGNOSIS — R413 Other amnesia: Secondary | ICD-10-CM

## 2019-08-16 ENCOUNTER — Ambulatory Visit: Payer: Medicare Other | Admitting: Podiatry

## 2019-08-16 ENCOUNTER — Other Ambulatory Visit: Payer: Self-pay

## 2019-08-16 DIAGNOSIS — L603 Nail dystrophy: Secondary | ICD-10-CM

## 2019-08-16 DIAGNOSIS — B351 Tinea unguium: Secondary | ICD-10-CM | POA: Diagnosis not present

## 2019-08-16 DIAGNOSIS — M79675 Pain in left toe(s): Secondary | ICD-10-CM

## 2019-08-16 DIAGNOSIS — L600 Ingrowing nail: Secondary | ICD-10-CM

## 2019-08-16 DIAGNOSIS — M79674 Pain in right toe(s): Secondary | ICD-10-CM

## 2019-08-20 ENCOUNTER — Other Ambulatory Visit: Payer: Self-pay

## 2019-08-20 ENCOUNTER — Encounter: Payer: Self-pay | Admitting: Podiatry

## 2019-08-20 ENCOUNTER — Ambulatory Visit
Admission: RE | Admit: 2019-08-20 | Discharge: 2019-08-20 | Disposition: A | Discharge status: Home / Self Care | Payer: Medicare Other | Source: Ambulatory Visit | Attending: Acute Care | Admitting: Acute Care

## 2019-08-20 DIAGNOSIS — R413 Other amnesia: Secondary | ICD-10-CM | POA: Diagnosis present

## 2019-08-20 NOTE — Progress Notes (Signed)
Subjective: Barbara Vega is a 86 y.o.  female returns to office today for follow up evaluation after having left Hallux Lateral border nail avulsion performed. Patient has been soaking using epsom salt and applying topical antibiotic covered with bandaid daily. Patient denies fevers, chills, nausea, vomiting. Denies any calf pain, chest pain, SOB.  Patient also has secondary complaint of thickened elongated mycotic painful toenails x9.  Patient states is hard to ambulate with them.  She would like to have them debrided down and she is not able to do it herself.  She denies any other complaints  Objective:  Vitals: Reviewed  General: Well developed, nourished, in no acute distress, alert and oriented x3   Dermatology: Skin is warm, dry and supple bilateral. Lateral hallux nail border appears to be clean, dry, with mild granular tissue and surrounding scab. There is no surrounding erythema, edema, drainage/purulence. The remaining nails appear unremarkable at this time. There are no other lesions or other signs of infection present.  Thickened elongated mycotic dystrophic painful toenails x9.  Neurovascular status: Intact. No lower extremity swelling; No pain with calf compression bilateral.  Musculoskeletal: Decreased tenderness to palpation of the Lateral hallux nail fold(s). Muscular strength within normal limits bilateral.   Assesement and Plan: S/p partial nail avulsion, doing well.   -Continue soaking in epsom salts twice a day followed by antibiotic ointment and a band-aid. Can leave uncovered at night. Continue this until completely healed.  -If the area has not healed in 2 weeks, call the office for follow-up appointment, or sooner if any problems arise.  -Monitor for any signs/symptoms of infection. Call the office immediately if any occur or go directly to the emergency room. Call with any questions/concerns.  Onychomycosis with pain  -Nails palliatively debrided as below. -Educated  on self-care  Procedure: Nail Debridement Rationale: pain  Type of Debridement: manual, sharp debridement. Instrumentation: Nail nipper, rotary burr. Number of Nails: 9  Procedures and Treatment: Consent by patient was obtained for treatment procedures. The patient understood the discussion of treatment and procedures well. All questions were answered thoroughly reviewed. Debridement of mycotic and hypertrophic toenails, 1 through 5 bilateral and clearing of subungual debris. No ulceration, no infection noted.  Return Visit-Office Procedure: Patient instructed to return to the office for a follow up visit 3 months for continued evaluation and treatment.  Boneta Lucks, DPM    No follow-ups on file.   Boneta Lucks, DPM

## 2019-08-27 ENCOUNTER — Emergency Department
Admission: EM | Admit: 2019-08-27 | Discharge: 2019-08-27 | Disposition: A | Discharge status: Home / Self Care | Payer: Medicare Other | Attending: Emergency Medicine | Admitting: Emergency Medicine

## 2019-08-27 ENCOUNTER — Other Ambulatory Visit: Payer: Self-pay

## 2019-08-27 ENCOUNTER — Encounter: Payer: Self-pay | Admitting: Medical Oncology

## 2019-08-27 ENCOUNTER — Emergency Department: Payer: Medicare Other

## 2019-08-27 DIAGNOSIS — I10 Essential (primary) hypertension: Secondary | ICD-10-CM | POA: Insufficient documentation

## 2019-08-27 DIAGNOSIS — R110 Nausea: Secondary | ICD-10-CM

## 2019-08-27 DIAGNOSIS — R1032 Left lower quadrant pain: Secondary | ICD-10-CM | POA: Insufficient documentation

## 2019-08-27 DIAGNOSIS — Z79899 Other long term (current) drug therapy: Secondary | ICD-10-CM | POA: Diagnosis not present

## 2019-08-27 LAB — COMPREHENSIVE METABOLIC PANEL
ALT: 8 U/L (ref 0–44)
AST: 18 U/L (ref 15–41)
Albumin: 4.6 g/dL (ref 3.5–5.0)
Alkaline Phosphatase: 63 U/L (ref 38–126)
Anion gap: 9 (ref 5–15)
BUN: 12 mg/dL (ref 8–23)
CO2: 28 mmol/L (ref 22–32)
Calcium: 9.7 mg/dL (ref 8.9–10.3)
Chloride: 100 mmol/L (ref 98–111)
Creatinine, Ser: 0.69 mg/dL (ref 0.44–1.00)
GFR calc Af Amer: >60 mL/min (ref >60)
GFR calc non Af Amer: >60 mL/min (ref >60)
Glucose, Bld: 110 mg/dL — ABNORMAL HIGH (ref 70–99)
Potassium: 4.3 mmol/L (ref 3.5–5.1)
Sodium: 137 mmol/L (ref 135–145)
Total Bilirubin: 1 mg/dL (ref 0.3–1.2)
Total Protein: 7.1 g/dL (ref 6.5–8.1)

## 2019-08-27 LAB — URINALYSIS, COMPLETE (UACMP) WITH MICROSCOPIC
Bacteria, UA: NONE SEEN
Bilirubin Urine: NEGATIVE
Glucose, UA: NEGATIVE mg/dL
Hgb urine dipstick: NEGATIVE
Ketones, ur: NEGATIVE mg/dL
Nitrite: NEGATIVE
Protein, ur: NEGATIVE mg/dL
Specific Gravity, Urine: 1.006 (ref 1.005–1.030)
pH: 7 (ref 5.0–8.0)

## 2019-08-27 LAB — CBC
HCT: 44.7 % (ref 36.0–46.0)
Hemoglobin: 14.5 g/dL (ref 12.0–15.0)
MCH: 31 pg (ref 26.0–34.0)
MCHC: 32.4 g/dL (ref 30.0–36.0)
MCV: 95.7 fL (ref 80.0–100.0)
Platelets: 548 10*3/uL — ABNORMAL HIGH (ref 150–400)
RBC: 4.67 MIL/uL (ref 3.87–5.11)
RDW: 12.1 % (ref 11.5–15.5)
WBC: 5.7 10*3/uL (ref 4.0–10.5)
nRBC: 0 % (ref 0.0–0.2)

## 2019-08-27 LAB — LIPASE, BLOOD: Lipase: 29 U/L (ref 11–51)

## 2019-08-27 MED ORDER — ONDANSETRON 4 MG PO TBDP
4.0000 mg | ORAL_TABLET | Freq: Once | ORAL | Status: AC | PRN
  Administered 2019-08-27: 4 mg via ORAL

## 2019-08-27 MED ORDER — ONDANSETRON 4 MG PO TBDP: 4.0000 mg | ORAL_TABLET | Freq: Three times a day (TID) | ORAL | 0 refills | Status: DC | PRN

## 2019-08-27 MED ORDER — ONDANSETRON 4 MG PO TBDP
ORAL_TABLET | ORAL | Status: AC
  Filled 2019-08-27: qty 1

## 2019-08-27 NOTE — ED Notes (Signed)
Pt given ginger ale.

## 2019-08-27 NOTE — ED Provider Notes (Signed)
[facility] Emergency Department Provider Note  Time seen: 1:35 PM  I have reviewed the triage vital signs and the nursing notes.   HISTORY  Chief Complaint Nausea and Weakness   HPI Barbara Vega is a 86 y.o. female with a past medical history of anemia, arthritis, hypertension, presents to the emergency department for nausea.  According to the patient of the past 3 days or so she has been feeling nauseated.  Denies any vomiting.  Does state some mild left flank pain but states that has been intermittent over the past 2 weeks.  States mild constipation which she states is chronic for her.  Denies any dysuria or hematuria.  Denies any fever cough or shortness of breath.  Patient states she has not eaten or drink anything today because of the nausea sensation.   However is feeling thirsty and is requesting something to drink.  Past Medical History:  Diagnosis Date  . Adenomatous colon polyp   . Anemia    hx  . Arthritis   . Benign breast lumps   . History of colon polyps    benign  . Hypertension    takes Triamterene-HCTZ daily  . Insomnia    takes Restoril nightly  . Macular degeneration    dry  . Osteoporosis   . Sinus infection    taking Ceftin daily     Patient Active Problem List   Diagnosis Date Noted  . Generalized weakness 06/15/2019  . Hypokalemia 06/14/2019  . Pain due to onychomycosis of toenails of both feet 06/07/2019  . S/P kyphoplasty 06/01/2019  . DDD (degenerative disc disease), lumbar 06/16/2017  . SI (sacroiliac) joint dysfunction 06/16/2017  . Incarcerated left inguinal hernia   . Small bowel obstruction ([place])   . Hyponatremia 11/20/2016  . GERD (gastroesophageal reflux disease) 09/20/2016  . Dysphagia, unspecified 08/09/2016  . Papilloma of breast 04/03/2015  . Osteoporosis, post-menopausal 03/04/2015  . Cannot sleep 03/04/2015  . Hypertension 03/04/2015  . Adenomatous colon polyp 03/04/2015    Past Surgical History:   Procedure Laterality Date  . ABDOMINAL HYSTERECTOMY    . BREAST EXCISIONAL BIOPSY    . BREAST LUMPECTOMY Right 09/08/2015   intraductal papilloma  . BREAST LUMPECTOMY Left    2 bx done only one scar seen years ago neg  . cataract surgery Bilateral   . COLONOSCOPY    . COLONOSCOPY WITH PROPOFOL N/A 11/03/2016   Procedure: COLONOSCOPY WITH PROPOFOL;  Surgeon: Manya Silvas, MD;  Location: [facility] ENDOSCOPY;  Service: Endoscopy;  Laterality: N/A;  . ESOPHAGOGASTRODUODENOSCOPY (EGD) WITH PROPOFOL N/A 11/03/2016   Procedure: ESOPHAGOGASTRODUODENOSCOPY (EGD) WITH PROPOFOL;  Surgeon: Manya Silvas, MD;  Location: [facility] ENDOSCOPY;  Service: Endoscopy;  Laterality: N/A;  . INGUINAL HERNIA REPAIR Left 01/15/2017   Procedure: HERNIA REPAIR INGUINAL ADULT;  Surgeon: Florene Glen, MD;  Location: [facility] ORS;  Service: General;  Laterality: Left;  . KYPHOPLASTY N/A 05/15/2019   Procedure: KYPHOPLASTY T12;  Surgeon: Hessie Knows, MD;  Location: [facility] ORS;  Service: Orthopedics;  Laterality: N/A;  . KYPHOPLASTY N/A 06/01/2019   Procedure: T11 KYPHOPLASTY;  Surgeon: Hessie Knows, MD;  Location: [facility] ORS;  Service: Orthopedics;  Laterality: N/A;  . RADIOACTIVE SEED GUIDED EXCISIONAL BREAST BIOPSY Right 09/08/2015   Procedure: RADIOACTIVE SEED GUIDED EXCISIONAL BREAST BIOPSY;  Surgeon: Rolm Bookbinder, MD;  Location: [place];  Service: General;  Laterality: Right;    Prior to Admission medications   Medication Sig Start Date End Date Taking? Authorizing Provider  amLODipine (NORVASC) 2.5 MG tablet  07/09/19   [provider]  cetirizine (ZYRTEC) 10 MG tablet Take 10 mg by mouth daily as needed for allergies. 04/17/19   [provider]  conjugated estrogens (PREMARIN) vaginal cream Place 1 Applicatorful vaginally daily. Use pea sized amount M-W-Fr before bedtime 01/26/19   Hollice Espy, MD  cyclobenzaprine (FLEXERIL) 5 MG tablet Take 0.5 tablets (2.5 mg total) by mouth 2 (two) times  daily as needed for muscle spasms. 06/15/19   Fritzi Mandes, MD  diphenhydramine-acetaminophen (TYLENOL PM) 25-500 MG TABS tablet Take 1-2 tablets by mouth at bedtime.    [provider]  docusate sodium (COLACE) 100 MG capsule Take 100 mg by mouth at bedtime as needed for mild constipation.    [provider]  feeding supplement, ENSURE ENLIVE, (ENSURE ENLIVE) LIQD Take 237 mLs by mouth 2 (two) times daily between meals. 06/15/19   Fritzi Mandes, MD  fluticasone (FLONASE) 50 MCG/ACT nasal spray Place 2 sprays into both nostrils daily as needed for allergies. 04/17/19   [provider]  omeprazole (PRILOSEC OTC) 20 MG tablet Take 20 mg by mouth at bedtime.    [provider]  phenazopyridine (PYRIDIUM) 100 MG tablet Take 1 tablet (100 mg total) by mouth 3 (three) times daily as needed for pain. 05/01/19 04/30/20  Rudene Re, MD  predniSONE (DELTASONE) 10 MG tablet  06/15/19   [provider]  temazepam (RESTORIL) 30 MG capsule Take 30 mg by mouth at bedtime.  09/13/14   [provider]    Allergies  Allergen Reactions  . Codone [Hydrocodone] Nausea And Vomiting    Family History  Problem Relation Age of Onset  . Osteoporosis Mother   . Heart failure Mother   . Heart block Brother   . Alcohol abuse Brother   . Bladder Cancer Neg Hx   . Kidney cancer Neg Hx     Social History Social History   Tobacco Use  . Smoking status: Never Smoker  . Smokeless tobacco: Never Used  Substance Use Topics  . Alcohol use: No    Alcohol/week: 0.0 standard drinks  . Drug use: No    Review of Systems Constitutional: Negative for fever. Cardiovascular: Negative for chest pain. Respiratory: Negative for shortness of breath. Gastrointestinal: Mild left flank pain intermittent x1 to 2 weeks.Marland Kitchen  Positive for nausea negative for vomiting.  Positive for constipation which is chronic. Genitourinary: Negative for urinary compaints Musculoskeletal:  Negative for musculoskeletal complaints Neurological: Negative for headache All other ROS negative  ____________________________________________   PHYSICAL EXAM:  VITAL SIGNS: ED Triage Vitals [08/27/19 1000]  Enc Vitals Group     BP (!) 166/78     Pulse Rate 77     Resp 18     Temp 97.8 F (36.6 C)     Temp Source Oral     SpO2 99 %     Weight 130 lb (59 kg)     Height 5\' 2"  (1.575 m)     Head Circumference      Peak Flow      Pain Score 0     Pain Loc      Pain Edu?      Excl. in [place]?     Constitutional: Alert and oriented. Well appearing and in no distress. Eyes: Normal exam ENT      Head: Normocephalic and atraumatic.      Mouth/Throat: Mucous membranes are moist. Cardiovascular: Normal rate, regular rhythm. No murmur Respiratory:  Normal respiratory effort without tachypnea nor retractions. Breath sounds are clear  Gastrointestinal: Soft and nontender. No distention.   Musculoskeletal: Nontender with normal range of motion in all extremities. Neurologic:  Normal speech and language. No gross focal neurologic deficits Skin:  Skin is warm, dry and intact.  Psychiatric: Mood and affect are normal  ____________________________________________    EKG  EKG viewed and interpreted by myself shows a normal sinus rhythm at 74 bpm with a narrow QRS, normal axis, normal intervals, no concerning ST changes.  ____________________________________________    RADIOLOGY  CT scan largely nonrevealing.  4 mm right lower lobe nodule but the patient has no smoking history.  ____________________________________________   INITIAL IMPRESSION / ASSESSMENT AND PLAN / ED COURSE  Pertinent labs & imaging results that were available during my care of the patient were reviewed by me and considered in my medical decision making (see chart for details).   Patient presents emergency department for nausea.  Overall patient appears well, benign abdominal exam.  No concerning findings on  physical exam.  Patient's lab work is overall reassuring as well.  However given the patient's intermittent left flank pain and nausea we will obtain CT renal scan of her abdomen/pelvis to further evaluate.  Patient agreeable to plan of care.  Patient received 4 mg of ODT Zofran in triage and states the nausea is much better is asking for something to drink.  CT largely nonrevealing.  Patient is feeling better after Zofran able to drink.  We will discharge patient with a prescription for Zofran to be used as needed.  Patient will follow up with her doctor.  Barbara Vega was evaluated in Emergency Department on 08/27/2019 for the symptoms described in the history of present illness. She was evaluated in the context of the global COVID-19 pandemic, which necessitated consideration that the patient might be at risk for infection with the SARS-CoV-2 virus that causes COVID-19. Institutional protocols and algorithms that pertain to the evaluation of patients at risk for COVID-19 are in a state of rapid change based on information released by regulatory bodies including the CDC and federal and state organizations. These policies and algorithms were followed during the patient's care in the ED.  ____________________________________________   FINAL CLINICAL IMPRESSION(S) / ED DIAGNOSES  Nausea Left flank pain   Harvest Dark, MD 08/27/19 1454

## 2019-08-27 NOTE — ED Triage Notes (Signed)
Pt reports she has been feeling nauseated and weak for the past 2 days, denies vomiting, states that she is unable to eat. Pt denies pain.

## 2019-08-27 NOTE — ED Notes (Signed)
Transported to CT 

## 2019-08-31 ENCOUNTER — Emergency Department: Payer: Medicare Other

## 2019-08-31 ENCOUNTER — Emergency Department
Admission: EM | Admit: 2019-08-31 | Discharge: 2019-08-31 | Disposition: A | Discharge status: Home / Self Care | Payer: Medicare Other | Attending: Emergency Medicine | Admitting: Emergency Medicine

## 2019-08-31 DIAGNOSIS — I10 Essential (primary) hypertension: Secondary | ICD-10-CM | POA: Diagnosis not present

## 2019-08-31 DIAGNOSIS — K5900 Constipation, unspecified: Secondary | ICD-10-CM | POA: Insufficient documentation

## 2019-08-31 DIAGNOSIS — R339 Retention of urine, unspecified: Secondary | ICD-10-CM | POA: Insufficient documentation

## 2019-08-31 DIAGNOSIS — Z79899 Other long term (current) drug therapy: Secondary | ICD-10-CM | POA: Diagnosis not present

## 2019-08-31 DIAGNOSIS — R1084 Generalized abdominal pain: Secondary | ICD-10-CM | POA: Insufficient documentation

## 2019-08-31 DIAGNOSIS — R110 Nausea: Secondary | ICD-10-CM | POA: Diagnosis not present

## 2019-08-31 LAB — CBC
HCT: 44.2 % (ref 36.0–46.0)
Hemoglobin: 14.5 g/dL (ref 12.0–15.0)
MCH: 31.5 pg (ref 26.0–34.0)
MCHC: 32.8 g/dL (ref 30.0–36.0)
MCV: 95.9 fL (ref 80.0–100.0)
Platelets: 564 10*3/uL — ABNORMAL HIGH (ref 150–400)
RBC: 4.61 MIL/uL (ref 3.87–5.11)
RDW: 12.1 % (ref 11.5–15.5)
WBC: 9 10*3/uL (ref 4.0–10.5)
nRBC: 0 % (ref 0.0–0.2)

## 2019-08-31 LAB — URINALYSIS, COMPLETE (UACMP) WITH MICROSCOPIC
Bacteria, UA: NONE SEEN
Bilirubin Urine: NEGATIVE
Glucose, UA: NEGATIVE mg/dL
Hgb urine dipstick: NEGATIVE
Ketones, ur: NEGATIVE mg/dL
Leukocytes,Ua: NEGATIVE
Nitrite: NEGATIVE
Protein, ur: NEGATIVE mg/dL
Specific Gravity, Urine: 1.003 — ABNORMAL LOW (ref 1.005–1.030)
pH: 8 (ref 5.0–8.0)

## 2019-08-31 LAB — COMPREHENSIVE METABOLIC PANEL
ALT: 11 U/L (ref 0–44)
AST: 22 U/L (ref 15–41)
Albumin: 4.7 g/dL (ref 3.5–5.0)
Alkaline Phosphatase: 63 U/L (ref 38–126)
Anion gap: 11 (ref 5–15)
BUN: 12 mg/dL (ref 8–23)
CO2: 24 mmol/L (ref 22–32)
Calcium: 9.5 mg/dL (ref 8.9–10.3)
Chloride: 102 mmol/L (ref 98–111)
Creatinine, Ser: 0.67 mg/dL (ref 0.44–1.00)
GFR calc Af Amer: >60 mL/min (ref >60)
GFR calc non Af Amer: >60 mL/min (ref >60)
Glucose, Bld: 116 mg/dL — ABNORMAL HIGH (ref 70–99)
Potassium: 3.7 mmol/L (ref 3.5–5.1)
Sodium: 137 mmol/L (ref 135–145)
Total Bilirubin: 1.1 mg/dL (ref 0.3–1.2)
Total Protein: 7.3 g/dL (ref 6.5–8.1)

## 2019-08-31 LAB — LIPASE, BLOOD: Lipase: 35 U/L (ref 11–51)

## 2019-08-31 MED ORDER — SODIUM CHLORIDE 0.9 % IV BOLUS
500.0000 mL | Freq: Once | INTRAVENOUS | Status: AC
  Administered 2019-08-31: 500 mL via INTRAVENOUS

## 2019-08-31 MED ORDER — IOHEXOL 9 MG/ML PO SOLN
500.0000 mL | Freq: Two times a day (BID) | ORAL | Status: DC | PRN
  Administered 2019-08-31 (×2): 500 mL via ORAL

## 2019-08-31 MED ORDER — IOHEXOL 300 MG/ML  SOLN
100.0000 mL | Freq: Once | INTRAMUSCULAR | Status: AC | PRN
  Administered 2019-08-31: 100 mL via INTRAVENOUS

## 2019-08-31 MED ORDER — AMLODIPINE BESYLATE 5 MG PO TABS
5.0000 mg | ORAL_TABLET | Freq: Once | ORAL | Status: AC
  Administered 2019-08-31: 5 mg via ORAL
  Filled 2019-08-31: qty 1

## 2019-08-31 MED ORDER — ONDANSETRON HCL 4 MG/2ML IJ SOLN
4.0000 mg | INTRAMUSCULAR | Status: AC
  Administered 2019-08-31: 4 mg via INTRAVENOUS
  Filled 2019-08-31: qty 2

## 2019-08-31 NOTE — ED Notes (Signed)
This RN to bedside, pt assisted to the bathroom by this RN. CT notified patient with approx 100cc's left of oral contrast. Primary RN made aware that this RN assisted patient to the bathroom and CT made aware patient done with contrast.

## 2019-08-31 NOTE — ED Notes (Signed)
Registration at bedside.

## 2019-08-31 NOTE — ED Triage Notes (Signed)
[facility] abd pain, constipation with dark stools and nausea since yesterday. Pt took tums last night to help relieve pressure.  VS: 183/88, 96% RA, 104, 98.72F  Hx HTN, states she has not been taking BP meds

## 2019-08-31 NOTE — ED Notes (Signed)
Pt transported tot CT

## 2019-08-31 NOTE — ED Notes (Signed)
Pt ambulated to the bathroom at this time without assistance.

## 2019-08-31 NOTE — ED Provider Notes (Signed)
[facility] Emergency Department Provider Note  ____________________________________________   First MD Initiated Contact with Patient 08/31/19 0515     (approximate)  I have reviewed the triage vital signs and the nursing notes.   HISTORY  Chief Complaint Abdominal Pain    HPI Barbara Vega is a 86 y.o. female who presents by EMS for evaluation of nausea, abdominal pain, and dark stools.  She has had several other similar visits  recently to the emergency department, and in fact this is her eighth visit in 6 months with 1 prior admission.  However this is the first time that she has reported having dark stools.  She says that she suffers from chronic constipation and has to take something in order to have any bowel movements and she has noticed recently that the bowel movements are small and hard but are also much darker in color than usual.  She takes no anticoagulation.  She is also having waxing and waning intermittent abdominal pain that sometimes is severe and is currently mild.  She has persistent nausea and thinks that she used up all of the nausea medicine that was prescribed to her on her last ED visit which was about 4 days ago.  She denies fever/chills, chest pain, shortness of breath, cough, and dysuria.  She describes her symptoms as severe and nothing makes them better or worse.        Past Medical History:  Diagnosis Date  . Adenomatous colon polyp   . Anemia    hx  . Arthritis   . Benign breast lumps   . History of colon polyps    benign  . Hypertension    takes Triamterene-HCTZ daily  . Insomnia    takes Restoril nightly  . Macular degeneration    dry  . Osteoporosis   . Sinus infection    taking Ceftin daily     Patient Active Problem List   Diagnosis Date Noted  . Generalized weakness 06/15/2019  . Hypokalemia 06/14/2019  . Pain due to onychomycosis of toenails of both feet 06/07/2019  . S/P kyphoplasty 06/01/2019  . DDD  (degenerative disc disease), lumbar 06/16/2017  . SI (sacroiliac) joint dysfunction 06/16/2017  . Incarcerated left inguinal hernia   . Small bowel obstruction ([place])   . Hyponatremia 11/20/2016  . GERD (gastroesophageal reflux disease) 09/20/2016  . Dysphagia, unspecified 08/09/2016  . Papilloma of breast 04/03/2015  . Osteoporosis, post-menopausal 03/04/2015  . Cannot sleep 03/04/2015  . Hypertension 03/04/2015  . Adenomatous colon polyp 03/04/2015    Past Surgical History:  Procedure Laterality Date  . ABDOMINAL HYSTERECTOMY    . BREAST EXCISIONAL BIOPSY    . BREAST LUMPECTOMY Right 09/08/2015   intraductal papilloma  . BREAST LUMPECTOMY Left    2 bx done only one scar seen years ago neg  . cataract surgery Bilateral   . COLONOSCOPY    . COLONOSCOPY WITH PROPOFOL N/A 11/03/2016   Procedure: COLONOSCOPY WITH PROPOFOL;  Surgeon: Manya Silvas, MD;  Location: [facility] ENDOSCOPY;  Service: Endoscopy;  Laterality: N/A;  . ESOPHAGOGASTRODUODENOSCOPY (EGD) WITH PROPOFOL N/A 11/03/2016   Procedure: ESOPHAGOGASTRODUODENOSCOPY (EGD) WITH PROPOFOL;  Surgeon: Manya Silvas, MD;  Location: [facility] ENDOSCOPY;  Service: Endoscopy;  Laterality: N/A;  . INGUINAL HERNIA REPAIR Left 01/15/2017   Procedure: HERNIA REPAIR INGUINAL ADULT;  Surgeon: Florene Glen, MD;  Location: [facility] ORS;  Service: General;  Laterality: Left;  . KYPHOPLASTY N/A 05/15/2019   Procedure: KYPHOPLASTY T12;  Surgeon:  Hessie Knows, MD;  Location: [facility] ORS;  Service: Orthopedics;  Laterality: N/A;  . KYPHOPLASTY N/A 06/01/2019   Procedure: T11 KYPHOPLASTY;  Surgeon: Hessie Knows, MD;  Location: [facility] ORS;  Service: Orthopedics;  Laterality: N/A;  . RADIOACTIVE SEED GUIDED EXCISIONAL BREAST BIOPSY Right 09/08/2015   Procedure: RADIOACTIVE SEED GUIDED EXCISIONAL BREAST BIOPSY;  Surgeon: Rolm Bookbinder, MD;  Location: [facility];  Service: General;  Laterality: Right;    Prior to Admission medications   Medication Sig Start  Date End Date Taking? Authorizing Provider  amLODipine (NORVASC) 2.5 MG tablet  07/09/19   [provider]  cetirizine (ZYRTEC) 10 MG tablet Take 10 mg by mouth daily as needed for allergies. 04/17/19   [provider]  conjugated estrogens (PREMARIN) vaginal cream Place 1 Applicatorful vaginally daily. Use pea sized amount M-W-Fr before bedtime 01/26/19   Hollice Espy, MD  cyclobenzaprine (FLEXERIL) 5 MG tablet Take 0.5 tablets (2.5 mg total) by mouth 2 (two) times daily as needed for muscle spasms. 06/15/19   Fritzi Mandes, MD  diphenhydramine-acetaminophen (TYLENOL PM) 25-500 MG TABS tablet Take 1-2 tablets by mouth at bedtime.    [provider]  docusate sodium (COLACE) 100 MG capsule Take 100 mg by mouth at bedtime as needed for mild constipation.    [provider]  feeding supplement, ENSURE ENLIVE, (ENSURE ENLIVE) LIQD Take 237 mLs by mouth 2 (two) times daily between meals. 06/15/19   Fritzi Mandes, MD  fluticasone (FLONASE) 50 MCG/ACT nasal spray Place 2 sprays into both nostrils daily as needed for allergies. 04/17/19   [provider]  omeprazole (PRILOSEC OTC) 20 MG tablet Take 20 mg by mouth at bedtime.    [provider]  ondansetron (ZOFRAN ODT) 4 MG disintegrating tablet Take 1 tablet (4 mg total) by mouth every 8 (eight) hours as needed for nausea or vomiting. 08/27/19   Harvest Dark, MD  phenazopyridine (PYRIDIUM) 100 MG tablet Take 1 tablet (100 mg total) by mouth 3 (three) times daily as needed for pain. 05/01/19 04/30/20  Rudene Re, MD  predniSONE (DELTASONE) 10 MG tablet  06/15/19   [provider]  temazepam (RESTORIL) 30 MG capsule Take 30 mg by mouth at bedtime.  09/13/14   [provider]    Allergies Codone [hydrocodone]  Family History  Problem Relation Age of Onset  . Osteoporosis Mother   . Heart failure Mother   . Heart block Brother   . Alcohol abuse Brother   . Bladder Cancer Neg  Hx   . Kidney cancer Neg Hx     Social History Social History   Tobacco Use  . Smoking status: Never Smoker  . Smokeless tobacco: Never Used  Substance Use Topics  . Alcohol use: No    Alcohol/week: 0.0 standard drinks  . Drug use: No    Review of Systems Constitutional: No fever/chills Eyes: No visual changes. ENT: No sore throat. Cardiovascular: Denies chest pain. Respiratory: Denies shortness of breath. Gastrointestinal: Abdominal pain, dark stools, persistent nausea, chronic constipation. Genitourinary: Negative for dysuria. Musculoskeletal: Negative for neck pain.  Negative for back pain. Integumentary: Negative for rash. Neurological: Negative for headaches, focal weakness or numbness.   ____________________________________________   PHYSICAL EXAM:  VITAL SIGNS: ED Triage Vitals [08/31/19 0502]  Enc Vitals Group     BP (!) 176/88     Pulse Rate 82     Resp (!) 23     Temp 98.1 F (36.7 C)     Temp Source  Oral     SpO2 97 %     Weight      Height      Head Circumference      Peak Flow      Pain Score      Pain Loc      Pain Edu?      Excl. in [place]?     Constitutional: Alert and oriented.  Eyes: Conjunctivae are normal.  Head: Atraumatic. Nose: No congestion/rhinnorhea. Mouth/Throat: Patient is wearing a mask. Neck: No stridor.  No meningeal signs.   Cardiovascular: Normal rate, regular rhythm. Good peripheral circulation. Grossly normal heart sounds. Respiratory: Normal respiratory effort.  No retractions. Gastrointestinal: Soft and nondistended.  Tender to palpation in the right lower quadrant with no rebound or guarding.  Rectal exam demonstrates no large stool ball in the rectum, just a small amount of palpable firm but not hard stool.  Hemoccult negative.  ED chaperone present during exam. Musculoskeletal: No lower extremity tenderness nor edema. No gross deformities of extremities. Neurologic:  Normal speech and language. No gross focal  neurologic deficits are appreciated.  Skin:  Skin is warm, dry and intact. Psychiatric: Mood and affect are normal. Speech and behavior are normal.  ____________________________________________   LABS (all labs ordered are listed, but only abnormal results are displayed)  Labs Reviewed  COMPREHENSIVE METABOLIC PANEL - Abnormal; Notable for the following components:      Result Value   Glucose, Bld 116 (*)    All other components within normal limits  CBC - Abnormal; Notable for the following components:   Platelets 564 (*)    All other components within normal limits  URINALYSIS, COMPLETE (UACMP) WITH MICROSCOPIC - Abnormal; Notable for the following components:   Color, Urine STRAW (*)    APPearance CLEAR (*)    Specific Gravity, Urine 1.003 (*)    All other components within normal limits  LIPASE, BLOOD   ____________________________________________  EKG  No indication for emergent EKG   ____________________________________________  RADIOLOGY I, Hinda Kehr, personally viewed and evaluated these images (plain radiographs) as part of my medical decision making, as well as reviewing the written report by the radiologist.  ED MD interpretation:  CT abd/pelvis with PO and IV contrast pending at the time of transfer of care.  Official radiology report(s): No results found.  ____________________________________________   PROCEDURES   Procedure(s) performed (including Critical Care):  Procedures   ____________________________________________   INITIAL IMPRESSION / MDM / ASSESSMENT AND PLAN / ED COURSE  As part of my medical decision making, I reviewed the following data within the [place] notes reviewed and incorporated, Labs reviewed , Old chart reviewed, Patient signed out to Dr. Joni Fears and reviewed Notes from prior ED visits   Differential diagnosis includes, but is not limited to, gastrointestinal bleeding (diverticulosis, AV  malformation, neoplasm), chronic constipation and associated discomfort, SBO/ileus, diverticulitis, appendicitis.  Patient has a normal comprehensive metabolic panel and a normal CBC and normal lipase.  She does appear to possibly be a bit hemoconcentrated based on her increased platelet count and I am providing 500 mL of normal saline, but otherwise her lab work is reassuring.  Vital signs are stable except for chronic hypertension and she says she has not been taking her antihypertensives.  During her last visit a few days ago she had a CT renal stone protocol which showed no signs of acute abnormality including obstruction and she was treated with fluids and antiemetics and was  able to be discharged home.  This time however she is reporting dark stools and is tender to palpation specifically in the right lower quadrant.  I will return with immediate chaperone to perform a rectal exam to look for signs of GI bleeding and I will proceed with a CT scan with oral and IV contrast for optimal evaluation of her abdomen and pelvis including her intestines.  The oral contrast may also help with her chronic constipation.  She agrees with the plan.  I have ordered Zofran 4 mg IV in addition to fluids.         Clinical Course as of Aug 30 800  Fri Aug 31, 2019  0602 Patient's undertreated blood pressure seems to be going up so I ordered amlodipine 5 mg by mouth.  It appears that her usual dose is 2.5 mg but the 5 mg should be appropriate and should provide a more "smooth" treatment of her hypertension than would IV medications.   [CF]  [id number] Rectal exam is reassuring with no external abnormalities, nontender, and dark brown stool that was Hemoccult negative with quality control passed.   [CF]  0730 Transferring ED care to Dr. Joni Fears to follow up on CT scan and reassess.  Patient may be appropriate for discharge home if symptoms have improved and if there are no acute/emergent findings on CT.   [CF]     Clinical Course User Index [CF] Hinda Kehr, MD     ____________________________________________  FINAL CLINICAL IMPRESSION(S) / ED DIAGNOSES  Final diagnoses:  Nausea  Generalized abdominal pain  Constipation, unspecified constipation type     MEDICATIONS GIVEN DURING THIS VISIT:  Medications  iohexol (OMNIPAQUE) 9 MG/ML oral solution 500 mL (500 mLs Oral Contrast Given 08/31/19 0611)  ondansetron (ZOFRAN) injection 4 mg (4 mg Intravenous Given 08/31/19 0616)  sodium chloride 0.9 % bolus 500 mL (0 mLs Intravenous Stopped 08/31/19 0730)  amLODipine (NORVASC) tablet 5 mg (5 mg Oral Given 08/31/19 0745)  iohexol (OMNIPAQUE) 300 MG/ML solution 100 mL (100 mLs Intravenous Contrast Given 08/31/19 0801)     ED Discharge Orders    None      *Please note:  Skylan Sampat Hast was evaluated in Emergency Department on 08/31/2019 for the symptoms described in the history of present illness. She was evaluated in the context of the global COVID-19 pandemic, which necessitated consideration that the patient might be at risk for infection with the SARS-CoV-2 virus that causes COVID-19. Institutional protocols and algorithms that pertain to the evaluation of patients at risk for COVID-19 are in a state of rapid change based on information released by regulatory bodies including the CDC and federal and state organizations. These policies and algorithms were followed during the patient's care in the ED.  Some ED evaluations and interventions may be delayed as a result of limited staffing during the pandemic.*  Note:  This document was prepared using Dragon voice recognition software and may include unintentional dictation errors.   Hinda Kehr, MD 08/31/19 [phone]

## 2019-08-31 NOTE — ED Provider Notes (Signed)
Procedures  Clinical Course as of Aug 31 1155  Fri Aug 31, 2019  0602 Patient's undertreated blood pressure seems to be going up so I ordered amlodipine 5 mg by mouth.  It appears that her usual dose is 2.5 mg but the 5 mg should be appropriate and should provide a more "smooth" treatment of her hypertension than would IV medications.   [CF]  [id number] Rectal exam is reassuring with no external abnormalities, nontender, and dark brown stool that was Hemoccult negative with quality control passed.   [CF]  0730 Transferring ED care to Dr. Joni Fears to follow up on CT scan and reassess.  Patient may be appropriate for discharge home if symptoms have improved and if there are no acute/emergent findings on CT.   [CF]    Clinical Course User Index [CF] Hinda Kehr, MD    ----------------------------------------- 11:57 AM on 08/31/2019 ----------------------------------------- CT scan unremarkable except for distended bladder. Suspect anticholinergic effect of Benadryl that she takes for a sleep aid. Bladder was drained with in and out cath. Patient updated on CT results and reassuring work-up. Should follow-up with primary care and urology. She is nontoxic, vital signs are stable, suitable for outpatient follow-up.    Carrie Mew, MD 08/31/19 1158

## 2019-08-31 NOTE — Discharge Instructions (Addendum)
Please stop taking Tylenol PM to see if this resolves your urinary retention symptoms.  Your CT scan did not show any other abdominal issues, and your workup today was generally reassuring.

## 2019-10-04 ENCOUNTER — Ambulatory Visit: Payer: Medicare Other | Admitting: Podiatry

## 2019-11-15 ENCOUNTER — Ambulatory Visit: Payer: Medicare Other | Admitting: Podiatry

## 2019-11-15 ENCOUNTER — Other Ambulatory Visit: Payer: Self-pay

## 2019-11-15 DIAGNOSIS — M79674 Pain in right toe(s): Secondary | ICD-10-CM | POA: Diagnosis not present

## 2019-11-15 DIAGNOSIS — M79675 Pain in left toe(s): Secondary | ICD-10-CM

## 2019-11-15 DIAGNOSIS — B351 Tinea unguium: Secondary | ICD-10-CM | POA: Diagnosis not present

## 2019-11-19 ENCOUNTER — Encounter: Payer: Self-pay | Admitting: Podiatry

## 2019-11-19 NOTE — Progress Notes (Signed)
  Subjective:  Patient ID: Barbara Vega, female    DOB: 10/02/1933,  MRN: [id number]  Chief Complaint  Patient presents with  . Foot Pain    pt is here for a 3 month f/u of nail trim   86 y.o. female returns for the above complaint.  Patient presents with thickened elongated dystrophic toenails x10.  Patient states that a month be painful.  Patient has not been able to trim down herself but she is not able to reach to the nails.  Patient states that she would like for me to have them debrided down.  She denies any other acute complaints.  She is not a diabetic.  Objective:  There were no vitals filed for this visit. Podiatric Exam: Vascular: dorsalis pedis and posterior tibial pulses are palpable bilateral. Capillary return is immediate. Temperature gradient is WNL. Skin turgor WNL  Sensorium: Normal Semmes Weinstein monofilament test. Normal tactile sensation bilaterally. Nail Exam: Pt has thick disfigured discolored nails with subungual debris noted bilateral entire nail hallux through fifth toenails.  Pain on palpation to the nails. Ulcer Exam: There is no evidence of ulcer or pre-ulcerative changes or infection. Orthopedic Exam: Muscle tone and strength are WNL. No limitations in general ROM. No crepitus or effusions noted. HAV  B/L.  Hammer toes 2-5  B/L. Skin: No Porokeratosis. No infection or ulcers    Assessment & Plan:   1. Pain due to onychomycosis of toenails of both feet     Patient was evaluated and treated and all questions answered.  Onychomycosis with pain  -Nails palliatively debrided as below. -Educated on self-care  Procedure: Nail Debridement Rationale: pain  Type of Debridement: manual, sharp debridement. Instrumentation: Nail nipper, rotary burr. Number of Nails: 10  Procedures and Treatment: Consent by patient was obtained for treatment procedures. The patient understood the discussion of treatment and procedures well. All questions were answered  thoroughly reviewed. Debridement of mycotic and hypertrophic toenails, 1 through 5 bilateral and clearing of subungual debris. No ulceration, no infection noted.  Return Visit-Office Procedure: Patient instructed to return to the office for a follow up visit 3 months for continued evaluation and treatment.  Boneta Lucks, DPM    No follow-ups on file.

## 2020-03-12 IMAGING — XA DG C-ARM 1-60 MIN
1 series · 1 of 1 positions shown · non-contrast
Comparison: MRI lumbar spine 05/29/2019

CLINICAL DATA: T11 kyphoplasty.

EXAM:
DG C-ARM 1-60 MIN; THORACIC SPINE 2 VIEWS

[Series 5: ortho standard · 1 of 1 slices shown]
[im 1/1]
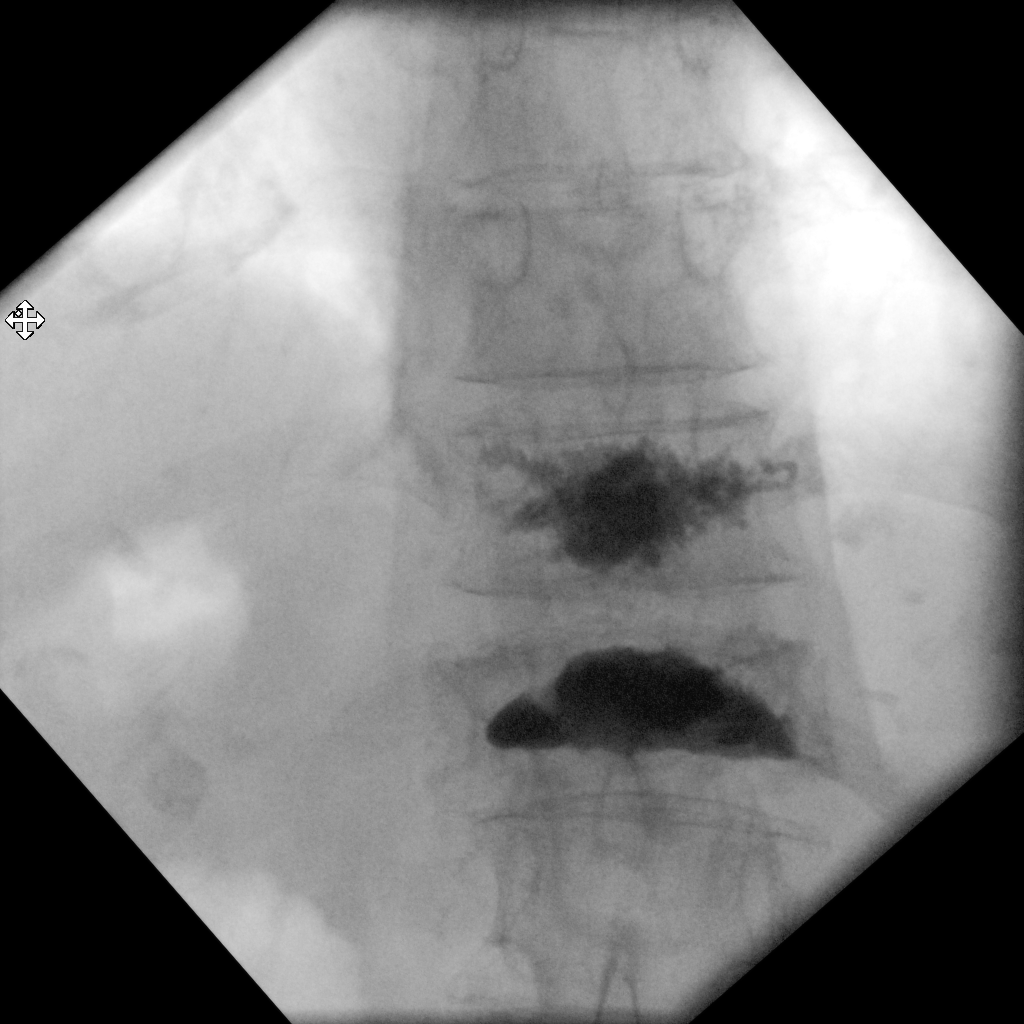

[1 of 1 positions shown; findings below may reference images not displayed]

FINDINGS: Two fluoroscopic spot images demonstrate kyphoplasty changes at T11.
No complicating features. Remote kyphoplasty changes noted at T12.
IMPRESSION: Kyphoplasty changes at T11 without complicating features.

## 2020-10-14 ENCOUNTER — Emergency Department: Payer: Medicare Other

## 2020-10-14 ENCOUNTER — Emergency Department
Admission: EM | Admit: 2020-10-14 | Discharge: 2020-10-14 | Disposition: A | Discharge status: Home / Self Care | Payer: Medicare Other | Attending: Emergency Medicine | Admitting: Emergency Medicine

## 2020-10-14 ENCOUNTER — Other Ambulatory Visit: Payer: Self-pay

## 2020-10-14 DIAGNOSIS — M791 Myalgia, unspecified site: Secondary | ICD-10-CM | POA: Insufficient documentation

## 2020-10-14 DIAGNOSIS — R6883 Chills (without fever): Secondary | ICD-10-CM | POA: Insufficient documentation

## 2020-10-14 DIAGNOSIS — R5383 Other fatigue: Secondary | ICD-10-CM | POA: Diagnosis not present

## 2020-10-14 DIAGNOSIS — Z20822 Contact with and (suspected) exposure to covid-19: Secondary | ICD-10-CM | POA: Diagnosis not present

## 2020-10-14 DIAGNOSIS — J029 Acute pharyngitis, unspecified: Secondary | ICD-10-CM | POA: Insufficient documentation

## 2020-10-14 DIAGNOSIS — R112 Nausea with vomiting, unspecified: Secondary | ICD-10-CM | POA: Insufficient documentation

## 2020-10-14 DIAGNOSIS — E860 Dehydration: Secondary | ICD-10-CM

## 2020-10-14 DIAGNOSIS — E871 Hypo-osmolality and hyponatremia: Secondary | ICD-10-CM | POA: Diagnosis not present

## 2020-10-14 DIAGNOSIS — I10 Essential (primary) hypertension: Secondary | ICD-10-CM | POA: Diagnosis not present

## 2020-10-14 DIAGNOSIS — Z79899 Other long term (current) drug therapy: Secondary | ICD-10-CM | POA: Diagnosis not present

## 2020-10-14 DIAGNOSIS — R5381 Other malaise: Secondary | ICD-10-CM

## 2020-10-14 LAB — COMPREHENSIVE METABOLIC PANEL
ALT: 8 U/L (ref 0–44)
AST: 19 U/L (ref 15–41)
Albumin: 4 g/dL (ref 3.5–5.0)
Alkaline Phosphatase: 61 U/L (ref 38–126)
Anion gap: 10 (ref 5–15)
BUN: 9 mg/dL (ref 8–23)
CO2: 23 mmol/L (ref 22–32)
Calcium: 9.1 mg/dL (ref 8.9–10.3)
Chloride: 94 mmol/L — ABNORMAL LOW (ref 98–111)
Creatinine, Ser: 0.47 mg/dL (ref 0.44–1.00)
GFR, Estimated: >60 mL/min (ref >60)
Glucose, Bld: 126 mg/dL — ABNORMAL HIGH (ref 70–99)
Potassium: 3.4 mmol/L — ABNORMAL LOW (ref 3.5–5.1)
Sodium: 127 mmol/L — ABNORMAL LOW (ref 135–145)
Total Bilirubin: 1.2 mg/dL (ref 0.3–1.2)
Total Protein: 6.7 g/dL (ref 6.5–8.1)

## 2020-10-14 LAB — CBC WITH DIFFERENTIAL/PLATELET
Abs Immature Granulocytes: 0.03 10*3/uL (ref 0.00–0.07)
Basophils Absolute: 0.1 10*3/uL (ref 0.0–0.1)
Basophils Relative: 1 %
Eosinophils Absolute: 0 10*3/uL (ref 0.0–0.5)
Eosinophils Relative: 1 %
HCT: 47.6 % — ABNORMAL HIGH (ref 36.0–46.0)
Hemoglobin: 16.4 g/dL — ABNORMAL HIGH (ref 12.0–15.0)
Immature Granulocytes: 0 %
Lymphocytes Relative: 18 %
Lymphs Abs: 1.4 10*3/uL (ref 0.7–4.0)
MCH: 29.8 pg (ref 26.0–34.0)
MCHC: 34.5 g/dL (ref 30.0–36.0)
MCV: 86.4 fL (ref 80.0–100.0)
Monocytes Absolute: 0.5 10*3/uL (ref 0.1–1.0)
Monocytes Relative: 6 %
Neutro Abs: 5.5 10*3/uL (ref 1.7–7.7)
Neutrophils Relative %: 74 %
Platelets: 520 10*3/uL — ABNORMAL HIGH (ref 150–400)
RBC: 5.51 MIL/uL — ABNORMAL HIGH (ref 3.87–5.11)
RDW: 12.7 % (ref 11.5–15.5)
WBC: 7.5 10*3/uL (ref 4.0–10.5)
nRBC: 0 % (ref 0.0–0.2)

## 2020-10-14 LAB — URINALYSIS, COMPLETE (UACMP) WITH MICROSCOPIC
Bacteria, UA: NONE SEEN
Bilirubin Urine: NEGATIVE
Glucose, UA: NEGATIVE mg/dL
Hgb urine dipstick: NEGATIVE
Ketones, ur: NEGATIVE mg/dL
Leukocytes,Ua: NEGATIVE
Nitrite: NEGATIVE
Protein, ur: NEGATIVE mg/dL
Specific Gravity, Urine: 1.001 — ABNORMAL LOW (ref 1.005–1.030)
Squamous Epithelial / HPF: NONE SEEN (ref 0–5)
WBC, UA: NONE SEEN WBC/hpf (ref 0–5)
pH: 8 (ref 5.0–8.0)

## 2020-10-14 LAB — LIPASE, BLOOD: Lipase: 31 U/L (ref 11–51)

## 2020-10-14 LAB — RESP PANEL BY RT-PCR (FLU A&B, COVID) ARPGX2
Influenza A by PCR: NEGATIVE
Influenza B by PCR: NEGATIVE
SARS Coronavirus 2 by RT PCR: NEGATIVE

## 2020-10-14 MED ORDER — FAMOTIDINE 20 MG PO TABS
20.0000 mg | ORAL_TABLET | Freq: Two times a day (BID) | ORAL | 0 refills | Status: DC
Start: 2020-10-14 — End: 2020-10-28

## 2020-10-14 MED ORDER — ONDANSETRON HCL 4 MG/2ML IJ SOLN
4.0000 mg | Freq: Once | INTRAMUSCULAR | Status: AC
  Administered 2020-10-14: 4 mg via INTRAVENOUS
  Filled 2020-10-14: qty 2

## 2020-10-14 MED ORDER — SODIUM CHLORIDE 0.9 % IV BOLUS
1000.0000 mL | Freq: Once | INTRAVENOUS | Status: AC
  Administered 2020-10-14: 1000 mL via INTRAVENOUS

## 2020-10-14 MED ORDER — METOCLOPRAMIDE HCL 10 MG PO TABS: 5.0000 mg | ORAL_TABLET | Freq: Four times a day (QID) | ORAL | 0 refills | Status: DC | PRN

## 2020-10-14 MED ORDER — METOCLOPRAMIDE HCL 5 MG/ML IJ SOLN
5.0000 mg | Freq: Once | INTRAMUSCULAR | Status: AC
  Administered 2020-10-14: 5 mg via INTRAVENOUS
  Filled 2020-10-14: qty 2

## 2020-10-14 NOTE — ED Triage Notes (Signed)
Pt to ED via [facility] from home and called our for breathing difficulty. Pt stating her throat feels swollen and she feels like she constantly has to vomit. Pt statting body chills and aches. Pt hx HTN and BP 188/98.

## 2020-10-14 NOTE — ED Provider Notes (Signed)
[facility] Emergency Department Provider Note  ____________________________________________  Time seen: Approximately 9:41 AM  I have reviewed the triage vital signs and the nursing notes.   HISTORY  Chief Complaint Shortness of Breath    HPI Barbara Vega is a 87 y.o. female with a history of hypertension, GERD who comes ED complaining of sore throat, body aches, chills, malaise, and nausea.  Symptoms started very gradually yesterday but have worsened today.  Denies chest pain or shortness of breath.  Denies fever, denies diarrhea or constipation.  No cough.  No known sick contacts.  Recently went on a trip to the [place] with her daughter over the last few days.     Past Medical History:  Diagnosis Date  . Adenomatous colon polyp   . Anemia    hx  . Arthritis   . Benign breast lumps   . History of colon polyps    benign  . Hypertension    takes Triamterene-HCTZ daily  . Insomnia    takes Restoril nightly  . Macular degeneration    dry  . Osteoporosis   . Sinus infection    taking Ceftin daily      Patient Active Problem List   Diagnosis Date Noted  . Generalized weakness 06/15/2019  . Hypokalemia 06/14/2019  . Pain due to onychomycosis of toenails of both feet 06/07/2019  . S/P kyphoplasty 06/01/2019  . DDD (degenerative disc disease), lumbar 06/16/2017  . SI (sacroiliac) joint dysfunction 06/16/2017  . Incarcerated left inguinal hernia   . Small bowel obstruction ([place])   . Hyponatremia 11/20/2016  . GERD (gastroesophageal reflux disease) 09/20/2016  . Dysphagia, unspecified 08/09/2016  . Papilloma of breast 04/03/2015  . Osteoporosis, post-menopausal 03/04/2015  . Cannot sleep 03/04/2015  . Hypertension 03/04/2015  . Adenomatous colon polyp 03/04/2015     Past Surgical History:  Procedure Laterality Date  . ABDOMINAL HYSTERECTOMY    . BREAST EXCISIONAL BIOPSY    . BREAST LUMPECTOMY Right 09/08/2015   intraductal  papilloma  . BREAST LUMPECTOMY Left    2 bx done only one scar seen years ago neg  . cataract surgery Bilateral   . COLONOSCOPY    . COLONOSCOPY WITH PROPOFOL N/A 11/03/2016   Procedure: COLONOSCOPY WITH PROPOFOL;  Surgeon: Manya Silvas, MD;  Location[facility] ENDOSCOPY;  Service: Endoscopy;  Laterality: N/A;  . ESOPHAGOGASTRODUODENOSCOPY (EGD) WITH PROPOFOL N/A 11/03/2016   Procedure: ESOPHAGOGASTRODUODENOSCOPY (EGD) WITH PROPOFOL;  Surgeon: Manya Silvas, MD;  Location: [facility] ENDOSCOPY;  Service: Endoscopy;  Laterality: N/A;  . INGUINAL HERNIA REPAIR Left 01/15/2017   Procedure: HERNIA REPAIR INGUINAL ADULT;  Surgeon: Florene Glen, MD;  Location: [facility] ORS;  Service: General;  Laterality: Left;  . KYPHOPLASTY N/A 05/15/2019   Procedure: KYPHOPLASTY T12;  Surgeon: Hessie Knows, MD;  Location: [facility] ORS;  Service: Orthopedics;  Laterality: N/A;  . KYPHOPLASTY N/A 06/01/2019   Procedure: T11 KYPHOPLASTY;  Surgeon: Hessie Knows, MD;  Location: [facility] ORS;  Service: Orthopedics;  Laterality: N/A;  . RADIOACTIVE SEED GUIDED EXCISIONAL BREAST BIOPSY Right 09/08/2015   Procedure: RADIOACTIVE SEED GUIDED EXCISIONAL BREAST BIOPSY;  Surgeon: Rolm Bookbinder, MD;  Location: [facility];  Service: General;  Laterality: Right;     Prior to Admission medications   Medication Sig Start Date End Date Taking? Authorizing Provider  famotidine (PEPCID) 20 MG tablet Take 1 tablet (20 mg total) by mouth 2 (two) times daily. 10/14/20  Yes Carrie Mew, MD  metoCLOPramide (REGLAN) 10 MG tablet Take  0.5 tablets (5 mg total) by mouth every 6 (six) hours as needed. 10/14/20  Yes Carrie Mew, MD  amLODipine (NORVASC) 2.5 MG tablet  07/09/19   [provider]  cetirizine (ZYRTEC) 10 MG tablet Take 10 mg by mouth daily as needed for allergies. 04/17/19   [provider]  conjugated estrogens (PREMARIN) vaginal cream Place 1 Applicatorful vaginally daily. Use pea sized amount M-W-Fr before  bedtime 01/26/19   Hollice Espy, MD  cyclobenzaprine (FLEXERIL) 5 MG tablet Take 0.5 tablets (2.5 mg total) by mouth 2 (two) times daily as needed for muscle spasms. 06/15/19   Fritzi Mandes, MD  diphenhydramine-acetaminophen (TYLENOL PM) 25-500 MG TABS tablet Take 1-2 tablets by mouth at bedtime.    [provider]  divalproex (DEPAKOTE) 125 MG DR tablet Take by mouth. 10/02/19   [provider]  docusate sodium (COLACE) 100 MG capsule Take 100 mg by mouth at bedtime as needed for mild constipation.    [provider]  feeding supplement, ENSURE ENLIVE, (ENSURE ENLIVE) LIQD Take 237 mLs by mouth 2 (two) times daily between meals. 06/15/19   Fritzi Mandes, MD  fluticasone (FLONASE) 50 MCG/ACT nasal spray Place 2 sprays into both nostrils daily as needed for allergies. 04/17/19   [provider]  omeprazole (PRILOSEC OTC) 20 MG tablet Take 20 mg by mouth at bedtime.    [provider]  ondansetron (ZOFRAN ODT) 4 MG disintegrating tablet Take 1 tablet (4 mg total) by mouth every 8 (eight) hours as needed for nausea or vomiting. 08/27/19   Harvest Dark, MD  predniSONE (DELTASONE) 10 MG tablet  06/15/19   [provider]  temazepam (RESTORIL) 30 MG capsule Take 30 mg by mouth at bedtime.  09/13/14   [provider]     Allergies Codone [hydrocodone]   Family History  Problem Relation Age of Onset  . Osteoporosis Mother   . Heart failure Mother   . Heart block Brother   . Alcohol abuse Brother   . Bladder Cancer Neg Hx   . Kidney cancer Neg Hx     Social History Social History   Tobacco Use  . Smoking status: Never Smoker  . Smokeless tobacco: Never Used  Vaping Use  . Vaping Use: Never used  Substance Use Topics  . Alcohol use: No    Alcohol/week: 0.0 standard drinks  . Drug use: No    Review of Systems  Constitutional:   No fever positive chills.  ENT:   Positive sore throat. No rhinorrhea. Cardiovascular:   No  chest pain or syncope. Respiratory:   No dyspnea or cough. Gastrointestinal:   Negative for abdominal pain, vomiting and diarrhea.  Musculoskeletal:   Negative for focal pain or swelling All other systems reviewed and are negative except as documented above in ROS and HPI.  ____________________________________________   PHYSICAL EXAM:  VITAL SIGNS: ED Triage Vitals  Enc Vitals Group     BP 10/14/20 0909 (!) 164/79     Pulse Rate 10/14/20 0909 79     Resp 10/14/20 0909 18     Temp 10/14/20 0909 97.9 F (36.6 C)     Temp Source 10/14/20 0909 Oral     SpO2 10/14/20 0909 97 %     Weight 10/14/20 0911 130 lb 1.1 oz (59 kg)     Height 10/14/20 0911 5\' 2"  (1.575 m)     Head Circumference --      Peak Flow --  Pain Score 10/14/20 0911 3     Pain Loc --      Pain Edu? --      Excl. in [place]? --     Vital signs reviewed, nursing assessments reviewed.   Constitutional:   Alert and oriented. Non-toxic appearance. Eyes:   Conjunctivae are normal. EOMI. PERRL. ENT      Head:   Normocephalic and atraumatic.      Nose:   Normal      Mouth/Throat:   Normal, moist mucosa      Neck:   No meningismus. Full ROM. Hematological/Lymphatic/Immunilogical:   No cervical lymphadenopathy. Cardiovascular:   RRR. Symmetric bilateral radial and DP pulses.  No murmurs. Cap refill less than 2 seconds. Respiratory:   Normal respiratory effort without tachypnea/retractions. Breath sounds are clear and equal bilaterally. No wheezes/rales/rhonchi. Gastrointestinal:   Soft and nontender. Non distended. There is no CVA tenderness.  No rebound, rigidity, or guarding. Musculoskeletal:   Normal range of motion in all extremities. No joint effusions.  No lower extremity tenderness.  No edema. Neurologic:   Normal speech and language.  Motor grossly intact. No acute focal neurologic deficits are appreciated.  Skin:    Skin is warm, dry and intact. No rash noted.  No petechiae, purpura, or  bullae.  ____________________________________________    LABS (pertinent positives/negatives) (all labs ordered are listed, but only abnormal results are displayed) Labs Reviewed  COMPREHENSIVE METABOLIC PANEL - Abnormal; Notable for the following components:      Result Value   Sodium 127 (*)    Potassium 3.4 (*)    Chloride 94 (*)    Glucose, Bld 126 (*)    All other components within normal limits  CBC WITH DIFFERENTIAL/PLATELET - Abnormal; Notable for the following components:   RBC 5.51 (*)    Hemoglobin 16.4 (*)    HCT 47.6 (*)    Platelets 520 (*)    All other components within normal limits  URINALYSIS, COMPLETE (UACMP) WITH MICROSCOPIC - Abnormal; Notable for the following components:   Color, Urine COLORLESS (*)    APPearance CLEAR (*)    Specific Gravity, Urine 1.001 (*)    All other components within normal limits  RESP PANEL BY RT-PCR (FLU A&B, COVID) ARPGX2  LIPASE, BLOOD   ____________________________________________   EKG  Interpreted by me Normal sinus rhythm rate of 77, right axis, normal intervals.  Incomplete right bundle branch block.  Normal ST segments and T waves.  No acute ischemic changes.  ____________________________________________    [id number]  DG Chest Portable 1 View  Result Date: 10/14/2020 CLINICAL DATA:  Cough and chills with malaise EXAM: PORTABLE CHEST 1 VIEW COMPARISON:  December 04, 2017 FINDINGS: Lungs are clear. Heart size and pulmonary vascularity are normal. No adenopathy. Evidence of previous kyphoplasty procedures in the lower thoracic region. IMPRESSION: No edema or airspace opacity. Cardiac silhouette within normal limits. Electronically Signed   By: Lowella Grip III M.D.   On: 10/14/2020 10:50    ____________________________________________   PROCEDURES Procedures  ____________________________________________  DIFFERENTIAL DIAGNOSIS   Viral syndrome, dehydration, electrolyte abnormality, pneumonia,  UTI  CLINICAL IMPRESSION / ASSESSMENT AND PLAN / ED COURSE  Medications ordered in the ED: Medications  sodium chloride 0.9 % bolus 1,000 mL (0 mLs Intravenous Stopped 10/14/20 1112)  ondansetron (ZOFRAN) injection 4 mg (4 mg Intravenous Given 10/14/20 0918)  metoCLOPramide (REGLAN) injection 5 mg (5 mg Intravenous Given 10/14/20 1112)    Pertinent labs & imaging results that were  available during my care of the patient were reviewed by me and considered in my medical decision making (see chart for details).  Fredrick Dray Grinder was evaluated in Emergency Department on 10/14/2020 for the symptoms described in the history of present illness. She was evaluated in the context of the global COVID-19 pandemic, which necessitated consideration that the patient might be at risk for infection with the SARS-CoV-2 virus that causes COVID-19. Institutional protocols and algorithms that pertain to the evaluation of patients at risk for COVID-19 are in a state of rapid change based on information released by regulatory bodies including the CDC and federal and state organizations. These policies and algorithms were followed during the patient's care in the ED.     Clinical Course as of 10/14/20 1229  Tue Oct 14, 2020  0908 Patient presents with malaise, chills, body aches, nausea and vomiting.  Suspect viral syndrome.  Will give IV fluids and Zofran, check labs and COVID/flu. [PS]  1048 COVID and flu negative.  Chest x-ray viewed and interpreted by me, unremarkable.  No pneumonia pleural effusion or pneumothorax. [PS]    Clinical Course User Index [PS] Carrie Mew, MD     ----------------------------------------- 12:29 PM on 10/14/2020 -----------------------------------------  Patient is tolerating p.o., ambulatory with steady gait, feeling much better, using the bathroom.  She notes that this morning even with all of her symptoms, she was able to walk at home and take care of herself and had no  concerns about falling.  Stable for discharge home.  ____________________________________________   FINAL CLINICAL IMPRESSION(S) / ED DIAGNOSES    Final diagnoses:  Malaise and fatigue  Dehydration  Hyponatremia     ED Discharge Orders         Ordered    famotidine (PEPCID) 20 MG tablet  2 times daily        10/14/20 1229    metoCLOPramide (REGLAN) 10 MG tablet  Every 6 hours PRN        10/14/20 1229          Portions of this note were generated with dragon dictation software. Dictation errors may occur despite best attempts at proofreading.   Carrie Mew, MD 10/14/20 1230

## 2020-10-14 NOTE — ED Notes (Signed)
Pt assisted to bathroom. Pt cleaned up and clean linens and chux placed.

## 2020-10-14 NOTE — Discharge Instructions (Signed)
Your lab test showed a low sodium level which can make you feel very weak.  We gave you IV fluids and medicine to control your nausea.  Continue taking medicine as needed and follow-up with your doctor for lab recheck.

## 2020-10-26 ENCOUNTER — Emergency Department: Payer: Medicare Other

## 2020-10-26 ENCOUNTER — Inpatient Hospital Stay: Payer: Medicare Other

## 2020-10-26 ENCOUNTER — Other Ambulatory Visit: Payer: Self-pay

## 2020-10-26 ENCOUNTER — Inpatient Hospital Stay
Admission: EM | Admit: 2020-10-26 | Discharge: 2020-10-28 | DRG: 641 | Disposition: A | Discharge status: Home / Self Care | Payer: Medicare Other | Attending: Internal Medicine | Admitting: Internal Medicine

## 2020-10-26 DIAGNOSIS — E876 Hypokalemia: Secondary | ICD-10-CM | POA: Diagnosis present

## 2020-10-26 DIAGNOSIS — G4700 Insomnia, unspecified: Secondary | ICD-10-CM | POA: Diagnosis present

## 2020-10-26 DIAGNOSIS — F0280 Dementia in other diseases classified elsewhere without behavioral disturbance: Secondary | ICD-10-CM | POA: Diagnosis present

## 2020-10-26 DIAGNOSIS — K449 Diaphragmatic hernia without obstruction or gangrene: Secondary | ICD-10-CM | POA: Diagnosis present

## 2020-10-26 DIAGNOSIS — R110 Nausea: Secondary | ICD-10-CM

## 2020-10-26 DIAGNOSIS — Z86010 Personal history of colonic polyps: Secondary | ICD-10-CM | POA: Diagnosis not present

## 2020-10-26 DIAGNOSIS — I10 Essential (primary) hypertension: Secondary | ICD-10-CM | POA: Diagnosis present

## 2020-10-26 DIAGNOSIS — Z8262 Family history of osteoporosis: Secondary | ICD-10-CM | POA: Diagnosis not present

## 2020-10-26 DIAGNOSIS — Z8249 Family history of ischemic heart disease and other diseases of the circulatory system: Secondary | ICD-10-CM | POA: Diagnosis not present

## 2020-10-26 DIAGNOSIS — Z79899 Other long term (current) drug therapy: Secondary | ICD-10-CM

## 2020-10-26 DIAGNOSIS — E871 Hypo-osmolality and hyponatremia: Principal | ICD-10-CM | POA: Diagnosis present

## 2020-10-26 DIAGNOSIS — R300 Dysuria: Secondary | ICD-10-CM | POA: Diagnosis present

## 2020-10-26 DIAGNOSIS — G309 Alzheimer's disease, unspecified: Secondary | ICD-10-CM | POA: Diagnosis present

## 2020-10-26 DIAGNOSIS — K219 Gastro-esophageal reflux disease without esophagitis: Secondary | ICD-10-CM | POA: Diagnosis present

## 2020-10-26 DIAGNOSIS — R531 Weakness: Secondary | ICD-10-CM

## 2020-10-26 DIAGNOSIS — R112 Nausea with vomiting, unspecified: Secondary | ICD-10-CM | POA: Diagnosis present

## 2020-10-26 DIAGNOSIS — Z90710 Acquired absence of both cervix and uterus: Secondary | ICD-10-CM | POA: Diagnosis not present

## 2020-10-26 DIAGNOSIS — Z20822 Contact with and (suspected) exposure to covid-19: Secondary | ICD-10-CM | POA: Diagnosis present

## 2020-10-26 DIAGNOSIS — H353190 Nonexudative age-related macular degeneration, unspecified eye, stage unspecified: Secondary | ICD-10-CM | POA: Diagnosis present

## 2020-10-26 LAB — RESP PANEL BY RT-PCR (FLU A&B, COVID) ARPGX2
Influenza A by PCR: NEGATIVE
Influenza B by PCR: NEGATIVE
SARS Coronavirus 2 by RT PCR: NEGATIVE

## 2020-10-26 LAB — URINALYSIS, COMPLETE (UACMP) WITH MICROSCOPIC
Bilirubin Urine: NEGATIVE
Glucose, UA: NEGATIVE mg/dL
Hgb urine dipstick: NEGATIVE
Ketones, ur: 5 mg/dL — AB
Leukocytes,Ua: NEGATIVE
Nitrite: NEGATIVE
Protein, ur: NEGATIVE mg/dL
Specific Gravity, Urine: 1.015 (ref 1.005–1.030)
Squamous Epithelial / HPF: NONE SEEN (ref 0–5)
pH: 8 (ref 5.0–8.0)

## 2020-10-26 LAB — SODIUM, URINE, RANDOM: Sodium, Ur: 73 mmol/L

## 2020-10-26 LAB — CBC
HCT: 45.2 % (ref 36.0–46.0)
Hemoglobin: 15.6 g/dL — ABNORMAL HIGH (ref 12.0–15.0)
MCH: 30.1 pg (ref 26.0–34.0)
MCHC: 34.5 g/dL (ref 30.0–36.0)
MCV: 87.3 fL (ref 80.0–100.0)
Platelets: 593 10*3/uL — ABNORMAL HIGH (ref 150–400)
RBC: 5.18 MIL/uL — ABNORMAL HIGH (ref 3.87–5.11)
RDW: 12.4 % (ref 11.5–15.5)
WBC: 11.2 10*3/uL — ABNORMAL HIGH (ref 4.0–10.5)
nRBC: 0 % (ref 0.0–0.2)

## 2020-10-26 LAB — COMPREHENSIVE METABOLIC PANEL
ALT: 7 U/L (ref 0–44)
AST: 19 U/L (ref 15–41)
Albumin: 4.2 g/dL (ref 3.5–5.0)
Alkaline Phosphatase: 72 U/L (ref 38–126)
Anion gap: 8 (ref 5–15)
BUN: 8 mg/dL (ref 8–23)
CO2: 24 mmol/L (ref 22–32)
Calcium: 9 mg/dL (ref 8.9–10.3)
Chloride: 92 mmol/L — ABNORMAL LOW (ref 98–111)
Creatinine, Ser: 0.46 mg/dL (ref 0.44–1.00)
GFR, Estimated: >60 mL/min (ref >60)
Glucose, Bld: 133 mg/dL — ABNORMAL HIGH (ref 70–99)
Potassium: 3.3 mmol/L — ABNORMAL LOW (ref 3.5–5.1)
Sodium: 124 mmol/L — ABNORMAL LOW (ref 135–145)
Total Bilirubin: 0.9 mg/dL (ref 0.3–1.2)
Total Protein: 6.5 g/dL (ref 6.5–8.1)

## 2020-10-26 LAB — TROPONIN I (HIGH SENSITIVITY)
Troponin I (High Sensitivity): 6 ng/L (ref <18)
Troponin I (High Sensitivity): 14 ng/L (ref <18)

## 2020-10-26 LAB — OSMOLALITY, URINE: Osmolality, Ur: 280 mOsm/kg — ABNORMAL LOW (ref 300–900)

## 2020-10-26 LAB — MAGNESIUM: Magnesium: 2 mg/dL (ref 1.7–2.4)

## 2020-10-26 LAB — OSMOLALITY: Osmolality: 264 mOsm/kg — ABNORMAL LOW (ref 275–295)

## 2020-10-26 LAB — LIPASE, BLOOD: Lipase: 31 U/L (ref 11–51)

## 2020-10-26 MED ORDER — SODIUM CHLORIDE 0.9% FLUSH
3.0000 mL | Freq: Two times a day (BID) | INTRAVENOUS | Status: DC
  Administered 2020-10-26 – 2020-10-28 (×5): 3 mL via INTRAVENOUS

## 2020-10-26 MED ORDER — PANTOPRAZOLE SODIUM 40 MG IV SOLR
40.0000 mg | INTRAVENOUS | Status: DC
  Administered 2020-10-26 – 2020-10-27 (×2): 40 mg via INTRAVENOUS
  Filled 2020-10-26 (×2): qty 40

## 2020-10-26 MED ORDER — ONDANSETRON 4 MG PO TBDP
4.0000 mg | ORAL_TABLET | Freq: Once | ORAL | Status: AC | PRN
  Administered 2020-10-26: 4 mg via ORAL
  Filled 2020-10-26 (×2): qty 1

## 2020-10-26 MED ORDER — ENOXAPARIN SODIUM 40 MG/0.4ML IJ SOSY: 40.0000 mg | PREFILLED_SYRINGE | INTRAMUSCULAR | Status: DC

## 2020-10-26 MED ORDER — POTASSIUM CHLORIDE CRYS ER 20 MEQ PO TBCR
40.0000 meq | EXTENDED_RELEASE_TABLET | Freq: Once | ORAL | Status: DC
  Filled 2020-10-26: qty 2

## 2020-10-26 MED ORDER — DONEPEZIL HCL 5 MG PO TABS
5.0000 mg | ORAL_TABLET | Freq: Every day | ORAL | Status: DC
  Administered 2020-10-26 – 2020-10-27 (×2): 5 mg via ORAL
  Filled 2020-10-26 (×2): qty 1

## 2020-10-26 MED ORDER — ACETAMINOPHEN 650 MG RE SUPP: 650.0000 mg | Freq: Four times a day (QID) | RECTAL | Status: DC | PRN

## 2020-10-26 MED ORDER — SODIUM CHLORIDE 0.9 % IV SOLN: 250.0000 mL | INTRAVENOUS | Status: DC | PRN

## 2020-10-26 MED ORDER — DROPERIDOL 2.5 MG/ML IJ SOLN
1.2500 mg | Freq: Once | INTRAMUSCULAR | Status: AC
  Administered 2020-10-26: 1.25 mg via INTRAVENOUS

## 2020-10-26 MED ORDER — POTASSIUM CHLORIDE IN NACL 40-0.9 MEQ/L-% IV SOLN
INTRAVENOUS | Status: DC
  Filled 2020-10-26 (×3): qty 1000

## 2020-10-26 MED ORDER — ESTROGENS, CONJUGATED 0.625 MG/GM VA CREA
1.0000 | TOPICAL_CREAM | Freq: Every day | VAGINAL | Status: DC
  Filled 2020-10-26: qty 30

## 2020-10-26 MED ORDER — AMLODIPINE BESYLATE 5 MG PO TABS
2.5000 mg | ORAL_TABLET | Freq: Every day | ORAL | Status: DC
  Administered 2020-10-26 – 2020-10-28 (×3): 2.5 mg via ORAL
  Filled 2020-10-26 (×3): qty 1

## 2020-10-26 MED ORDER — ESTROGENS, CONJUGATED 0.625 MG/GM VA CREA: 1.0000 | TOPICAL_CREAM | Freq: Every day | VAGINAL | Status: DC

## 2020-10-26 MED ORDER — DIVALPROEX SODIUM 250 MG PO DR TAB
250.0000 mg | DELAYED_RELEASE_TABLET | Freq: Every day | ORAL | Status: DC
  Administered 2020-10-26 – 2020-10-28 (×3): 250 mg via ORAL
  Filled 2020-10-26 (×3): qty 1

## 2020-10-26 MED ORDER — METOCLOPRAMIDE HCL 5 MG/ML IJ SOLN
10.0000 mg | Freq: Four times a day (QID) | INTRAMUSCULAR | Status: AC
  Administered 2020-10-26 (×3): 10 mg via INTRAVENOUS
  Filled 2020-10-26 (×3): qty 2

## 2020-10-26 MED ORDER — LACTATED RINGERS IV BOLUS
1000.0000 mL | Freq: Once | INTRAVENOUS | Status: AC
  Administered 2020-10-26: 1000 mL via INTRAVENOUS

## 2020-10-26 MED ORDER — METOCLOPRAMIDE HCL 5 MG/ML IJ SOLN
10.0000 mg | Freq: Once | INTRAMUSCULAR | Status: AC
  Administered 2020-10-26: 10 mg via INTRAVENOUS
  Filled 2020-10-26: qty 2

## 2020-10-26 MED ORDER — TEMAZEPAM 15 MG PO CAPS
30.0000 mg | ORAL_CAPSULE | Freq: Every day | ORAL | Status: DC
  Administered 2020-10-26 – 2020-10-27 (×2): 30 mg via ORAL
  Filled 2020-10-26 (×2): qty 2

## 2020-10-26 MED ORDER — OCUVITE-LUTEIN PO CAPS
1.0000 | ORAL_CAPSULE | Freq: Every day | ORAL | Status: DC
  Administered 2020-10-26 – 2020-10-27 (×2): 1 via ORAL
  Filled 2020-10-26 (×3): qty 1

## 2020-10-26 MED ORDER — FAMOTIDINE IN NACL 20-0.9 MG/50ML-% IV SOLN
20.0000 mg | Freq: Once | INTRAVENOUS | Status: AC
  Administered 2020-10-26: 20 mg via INTRAVENOUS
  Filled 2020-10-26: qty 50

## 2020-10-26 MED ORDER — ACETAMINOPHEN 325 MG PO TABS
650.0000 mg | ORAL_TABLET | Freq: Four times a day (QID) | ORAL | Status: DC | PRN
  Administered 2020-10-26: 650 mg via ORAL
  Filled 2020-10-26: qty 2

## 2020-10-26 MED ORDER — IOHEXOL 300 MG/ML  SOLN
75.0000 mL | Freq: Once | INTRAMUSCULAR | Status: AC | PRN
  Administered 2020-10-26: 75 mL via INTRAVENOUS

## 2020-10-26 MED ORDER — SODIUM CHLORIDE 0.9% FLUSH: 3.0000 mL | INTRAVENOUS | Status: DC | PRN

## 2020-10-26 NOTE — ED Notes (Signed)
Pt ringing call bell in subwait. Pt states "I can't wait, i'm going to die if you don't do something". Pt states "I can't breathe" pt is hyperventilating. Pox on ra 100%, hr 84, blood pressure 168/90. Attempted to coach pt to slow breathing without success. Pt offered oxygen at 1 lpm for comfort. Pt with pwd skin. Pt states "call my son, since you aren't doing anything, I can die at home." pt with clear breath sounds, strong radial pulse. Pt informed that working on discharging a pt at this time and she will receive next bed. Pt asking staff to "move it". Pt states "i'm just going to lie on the floor. Pt informed that this rn has lowered head of recliner chair for pt comfort and not to lie on floor. Pt continues to rind call bell with this RN at side providing care and assessing. Pt informed will need to leave her temporarily to discharge a patient and clean that bed to move her to. Pt again propmts staff to "move it".

## 2020-10-26 NOTE — ED Notes (Signed)
Pt taken for CT 

## 2020-10-26 NOTE — ED Provider Notes (Signed)
7:03 AM Assumed care for off going team.   Blood pressure (!) 168/79, pulse 81, temperature 97.7 F (36.5 C), temperature source Oral, resp. rate 18, height 5' 2.5" (1.588 m), weight 63.5 kg, SpO2 94 %.  See their HPI for full report but in brief Trop, possible BMP--> 12 hours of nausea and not feeling well. Workup re-assuring. NA 124-- 127 last baseline. CT scan negative. UA. Getting meds now.   Reevaluated patient and still complaining of a lot of nausea.  Has been able to keep down some sips of water does not feel comfortable with discharge home and to this is reasonable given patient's significant hyponatremia in the setting of normal sodium back in September of last year.  Will discuss hospital team for admission           Vanessa Franklin, MD 10/26/20 [phone]

## 2020-10-26 NOTE — ED Notes (Signed)
Patient to waiting room via wheelchair for nausea.  EMS reports patient has been nauseated since Saturday afternoon.  EMS intervention -- [86, bp, 187/80, pulse oxi 98%.

## 2020-10-26 NOTE — ED Notes (Signed)
pts IV infiltrated after returning from CT

## 2020-10-26 NOTE — H&P (Signed)
History and Physical    Barbara Vega GBM:[id number] DOB: [birth year] DOA: 10/26/2020  PCP: [name], MD   Patient coming from: Home  I have personally briefly reviewed patient's old medical records in [place]  Chief Complaint: Nausea  HPI: Barbara Vega is a 87 y.o. female with medical history significant for hypertension, macular degeneration who presents to the emergency room via EMS for evaluation of nausea.  Patient states that she ate some peanuts the day prior to her admission and since then has had " indigestion"  and persistent nausea.  She has been unable to tolerate any oral intake.  She denies having any emesis.  She complains of a headache as well as dizziness. She has not had any prior episodes in the past. Last bowel movement was 1 day prior to her admission. She denies having any chest pain, no shortness of breath, no fever, no chills, no lightheadedness, no palpitations, no diaphoresis, no urinary symptoms no changes in her bowel habits, no leg swelling, no blurred vision, no focal deficits. Labs show sodium 124, potassium 3.3, chloride 92, bicarb 24, glucose 133, BUN 8, creatinine 0.46, calcium 9.0, magnesium 2.0, alkaline phosphatase 72, albumin 4.2, lipase 31, AST 19, ALT 7, total protein 6.5, white count 11.2, hemoglobin 15.6, hematocrit 45.2, MCV 87.3, RDW 12.4, platelet count 593 Respiratory viral panel is negative Chest x-ray reviewed by me shows no acute cardiopulmonary disease. Cardiomegaly without congestive failure.Small hiatal hernia. CT scan of abdomen and pelvis shows small sliding hiatal hernia.  No bowel obstruction. Twelve-lead EKG reviewed by me shows normal sinus rhythm with left axis deviation.     ED Course: Patient is an 87 year old female who presents to the ER for evaluation of persistent nausea and " indigestion" after she ate peanuts. Labs show serum sodium of 124 as well as hypokalemia. She will be admitted to the hospital for  further evaluation.    Review of Systems: As per HPI otherwise all other systems reviewed and negative.    Past Medical History:  Diagnosis Date   Adenomatous colon polyp    Anemia    hx   Arthritis    Benign breast lumps    History of colon polyps    benign   Hypertension    takes Triamterene-HCTZ daily   Insomnia    takes Restoril nightly   Macular degeneration    dry   Osteoporosis    Sinus infection    taking Ceftin daily     Past Surgical History:  Procedure Laterality Date   ABDOMINAL HYSTERECTOMY     BREAST EXCISIONAL BIOPSY     BREAST LUMPECTOMY Right 09/08/2015   intraductal papilloma   BREAST LUMPECTOMY Left    2 bx done only one scar seen years ago neg   cataract surgery Bilateral    COLONOSCOPY     COLONOSCOPY WITH PROPOFOL N/A 11/03/2016   Procedure: COLONOSCOPY WITH PROPOFOL;  Surgeon: Manya Silvas, MD;  Location: [facility] ENDOSCOPY;  Service: Endoscopy;  Laterality: N/A;   ESOPHAGOGASTRODUODENOSCOPY (EGD) WITH PROPOFOL N/A 11/03/2016   Procedure: ESOPHAGOGASTRODUODENOSCOPY (EGD) WITH PROPOFOL;  Surgeon: Manya Silvas, MD;  Location: [facility] ENDOSCOPY;  Service: Endoscopy;  Laterality: N/A;   INGUINAL HERNIA REPAIR Left 01/15/2017   Procedure: HERNIA REPAIR INGUINAL ADULT;  Surgeon: Florene Glen, MD;  Location: [facility] ORS;  Service: General;  Laterality: Left;   KYPHOPLASTY N/A 05/15/2019   Procedure: KYPHOPLASTY T12;  Surgeon: Hessie Knows, MD;  Location: [facility] ORS;  Service:  Orthopedics;  Laterality: N/A;   KYPHOPLASTY N/A 06/01/2019   Procedure: T11 KYPHOPLASTY;  Surgeon: Hessie Knows, MD;  Location: [facility] ORS;  Service: Orthopedics;  Laterality: N/A;   RADIOACTIVE SEED GUIDED EXCISIONAL BREAST BIOPSY Right 09/08/2015   Procedure: RADIOACTIVE SEED GUIDED EXCISIONAL BREAST BIOPSY;  Surgeon: Rolm Bookbinder, MD;  Location: [facility];  Service: General;  Laterality: Right;     reports that she has never smoked. She has never used smokeless tobacco. She  reports that she does not drink alcohol and does not use drugs.  Allergies  Allergen Reactions   Codone [Hydrocodone] Nausea And Vomiting    Family History  Problem Relation Age of Onset   Osteoporosis Mother    Heart failure Mother    Heart block Brother    Alcohol abuse Brother    Bladder Cancer Neg Hx    Kidney cancer Neg Hx      Prior to Admission medications   Medication Sig Start Date End Date Taking? Authorizing Provider  amLODipine (NORVASC) 2.5 MG tablet  07/09/19   [provider]  cetirizine (ZYRTEC) 10 MG tablet Take 10 mg by mouth daily as needed for allergies. 04/17/19   [provider]  conjugated estrogens (PREMARIN) vaginal cream Place 1 Applicatorful vaginally daily. Use pea sized amount M-W-Fr before bedtime 01/26/19   Hollice Espy, MD  cyclobenzaprine (FLEXERIL) 5 MG tablet Take 0.5 tablets (2.5 mg total) by mouth 2 (two) times daily as needed for muscle spasms. 06/15/19   Fritzi Mandes, MD  diphenhydramine-acetaminophen (TYLENOL PM) 25-500 MG TABS tablet Take 1-2 tablets by mouth at bedtime.    [provider]  divalproex (DEPAKOTE) 125 MG DR tablet Take by mouth. 10/02/19   [provider]  docusate sodium (COLACE) 100 MG capsule Take 100 mg by mouth at bedtime as needed for mild constipation.    [provider]  famotidine (PEPCID) 20 MG tablet Take 1 tablet (20 mg total) by mouth 2 (two) times daily. 10/14/20   Carrie Mew, MD  feeding supplement, ENSURE ENLIVE, (ENSURE ENLIVE) LIQD Take 237 mLs by mouth 2 (two) times daily between meals. 06/15/19   Fritzi Mandes, MD  fluticasone (FLONASE) 50 MCG/ACT nasal spray Place 2 sprays into both nostrils daily as needed for allergies. 04/17/19   [provider]  metoCLOPramide (REGLAN) 10 MG tablet Take 0.5 tablets (5 mg total) by mouth every 6 (six) hours as needed. 10/14/20   Carrie Mew, MD  omeprazole (PRILOSEC OTC) 20 MG tablet Take 20 mg by mouth at bedtime.     [provider]  ondansetron (ZOFRAN ODT) 4 MG disintegrating tablet Take 1 tablet (4 mg total) by mouth every 8 (eight) hours as needed for nausea or vomiting. 08/27/19   Harvest Dark, MD  predniSONE (DELTASONE) 10 MG tablet  06/15/19   [provider]  temazepam (RESTORIL) 30 MG capsule Take 30 mg by mouth at bedtime.  09/13/14   [provider]    Physical Exam: Vitals:   10/26/20 0329 10/26/20 0330 10/26/20 0520 10/26/20 0623  BP: (!) 178/69  (!) 165/70 (!) 168/79  Pulse: 77  80 81  Resp: 19  18 18   Temp: 97.7 F (36.5 C)     TempSrc: Oral     SpO2: 95%  94% 94%  Weight:  63.5 kg    Height:  5' 2.5" (1.588 m)       Vitals:   10/26/20 0329 10/26/20 0330 10/26/20 0520 10/26/20 0623  BP: (!) 178/69  Marland Kitchen)  165/70 (!) 168/79  Pulse: 77  80 81  Resp: 19  18 18   Temp: 97.7 F (36.5 C)     TempSrc: Oral     SpO2: 95%  94% 94%  Weight:  63.5 kg    Height:  5' 2.5" (1.588 m)        Constitutional: Alert and oriented x 3 .  Appears very uncomfortable HEENT:      Head: Normocephalic and atraumatic.         Eyes: PERLA, EOMI, Conjunctivae are normal. Sclera is non-icteric.       Mouth/Throat: Mucous membranes are moist.       Neck: Supple with no signs of meningismus. Cardiovascular: Regular rate and rhythm. No murmurs, gallops, or rubs. 2+ symmetrical distal pulses are present . No JVD. No LE edema Respiratory: Respiratory effort normal .Lungs sounds clear bilaterally. No wheezes, crackles, or rhonchi.  Gastrointestinal: Soft, suprapubic tenderness, and non distended with positive bowel sounds.  Genitourinary: No CVA tenderness. Musculoskeletal: Nontender with normal range of motion in all extremities. No cyanosis, or erythema of extremities. Neurologic:  Face is symmetric. Moving all extremities. No gross focal neurologic deficits . Skin: Skin is warm, dry.  No rash or ulcers Psychiatric: Mood and affect are normal    Labs on Admission: I have  personally reviewed following labs and imaging studies  CBC: Recent Labs  Lab 10/26/20 0337  WBC 11.2*  HGB 15.6*  HCT 45.2  MCV 87.3  PLT 601*   Basic Metabolic Panel: Recent Labs  Lab 10/26/20 0337  NA 124*  K 3.3*  CL 92*  CO2 24  GLUCOSE 133*  BUN 8  CREATININE 0.46  CALCIUM 9.0  MG 2.0   GFR: Estimated Creatinine Clearance: 46.4 mL/min (by C-G formula based on SCr of 0.46 mg/dL). Liver Function Tests: Recent Labs  Lab 10/26/20 0337  AST 19  ALT 7  ALKPHOS 72  BILITOT 0.9  PROT 6.5  ALBUMIN 4.2   Recent Labs  Lab 10/26/20 0337  LIPASE 31   No results for input(s): AMMONIA in the last 168 hours. Coagulation Profile: No results for input(s): INR, PROTIME in the last 168 hours. Cardiac Enzymes: No results for input(s): CKTOTAL, CKMB, CKMBINDEX, TROPONINI in the last 168 hours. BNP (last 3 results) No results for input(s): PROBNP in the last 8760 hours. HbA1C: No results for input(s): HGBA1C in the last 72 hours. CBG: No results for input(s): GLUCAP in the last 168 hours. Lipid Profile: No results for input(s): CHOL, HDL, LDLCALC, TRIG, CHOLHDL, LDLDIRECT in the last 72 hours. Thyroid Function Tests: No results for input(s): TSH, T4TOTAL, FREET4, T3FREE, THYROIDAB in the last 72 hours. Anemia Panel: No results for input(s): VITAMINB12, FOLATE, FERRITIN, TIBC, IRON, RETICCTPCT in the last 72 hours. Urine analysis:    Component Value Date/Time   COLORURINE STRAW (A) 10/26/2020 0620   APPEARANCEUR CLEAR (A) 10/26/2020 0620   APPEARANCEUR Cloudy (A) 12/20/2016 1304   LABSPEC 1.015 10/26/2020 0620   PHURINE 8.0 10/26/2020 0620   GLUCOSEU NEGATIVE 10/26/2020 0620   HGBUR NEGATIVE 10/26/2020 0620   BILIRUBINUR NEGATIVE 10/26/2020 0620   BILIRUBINUR Negative 12/20/2016 1304   KETONESUR 5 (A) 10/26/2020 0620   PROTEINUR NEGATIVE 10/26/2020 0620   NITRITE NEGATIVE 10/26/2020 0620   LEUKOCYTESUR NEGATIVE 10/26/2020 0620    Radiological Exams on  Admission: CT ABDOMEN PELVIS W CONTRAST  Result Date: 10/26/2020 CLINICAL DATA:  Acute nonlocalized abdominal pain with constipation and lower tenderness to palpation EXAM: CT ABDOMEN  AND PELVIS WITH CONTRAST TECHNIQUE: Multidetector CT imaging of the abdomen and pelvis was performed using the standard protocol following bolus administration of intravenous contrast. CONTRAST:  35mL OMNIPAQUE IOHEXOL 300 MG/ML  SOLN COMPARISON:  08/31/2019 FINDINGS: Lower chest:  Small sliding hiatal hernia.  Dependent atelectasis. Hepatobiliary: Small left hepatic cystic densities.No evidence of biliary obstruction or stone. Pancreas: Unremarkable. Spleen: Unremarkable. Adrenals/Urinary Tract: 19 mm left adrenal mass that is stable and most consistent with adenoma. No hydronephrosis or stone. Unremarkable bladder. Stomach/Bowel: Proximal gastric diverticulum. No obstruction. No appendicitis. Distal colonic diverticulosis. Vascular/Lymphatic: No acute vascular abnormality. Atheromatous calcification of the aorta. No mass or adenopathy. Reproductive:Hysterectomy. Other: No ascites or pneumoperitoneum. Musculoskeletal: No acute abnormalities. Lumbar spine degeneration with mild scoliosis. Remote T11 and T12 compression fractures with cement augmentation. IMPRESSION: No acute finding.  No bowel obstruction or visible inflammation. Chronic findings are described above. Electronically Signed   By: Monte Fantasia M.D.   On: 10/26/2020 05:44   DG Chest Portable 1 View  Result Date: 10/26/2020 CLINICAL DATA:  Shortness of breath. Generalized weakness. Hypertension. EXAM: PORTABLE CHEST 1 VIEW COMPARISON:  10/14/2020 FINDINGS: Patient rotated right. Midline trachea. Mild cardiomegaly. Small hiatal hernia. No pleural effusion or pneumothorax. Clear lungs. IMPRESSION: No acute cardiopulmonary disease. Cardiomegaly without congestive failure. Small hiatal hernia. Electronically Signed   By: Abigail Miyamoto M.D.   On: 10/26/2020 07:31      Assessment/Plan Principal Problem:   Hiatal hernia with GERD Active Problems:   Hypertension   Hyponatremia   Hypokalemia   Generalized weakness    Hiatal hernia with GERD Patient presents to the ER for evaluation of persistent nausea and symptoms of " indigestion" after she ate peanuts. Imaging shows a sliding hiatal hernia We will keep patient n.p.o. Start patient on IV PPI Place patient on scheduled Reglan   Hyponatremia/hypokalemia Secondary to poor oral intake Judicious IV fluid resuscitation Supplement potassium   Hypertension We will treat with IV hydralazine since patient is unable to tolerate any oral intake at this time.    DVT prophylaxis: SCD  Code Status: full code  Family Communication: Greater than 50% of time was spent discussing patient's condition and plan of care with her at the bedside.  All questions and concerns have been addressed.  She verbalizes understanding and agrees with the plan. Disposition Plan: Back to previous home environment Consults called: none  Status: At the time of admission, it appears that the appropriate admission status for this patient is inpatient. This is judged to be reasonable and necessary in order to provide the required intensity of service to ensure the patient's safety given the presenting symptoms, physical exam findings, and initial radiographic and laboratory data in the context of their comorbid conditions. Patient requires inpatient status due to high intensity of service, high risk for further deterioration and high frequency of surveillance required.    Collier Bullock MD [facility]     10/26/2020, 10:42 AM

## 2020-10-26 NOTE — ED Provider Notes (Signed)
[facility] Emergency Department Provider Note ____________________________________________   None    (approximate)  I have reviewed the triage vital signs and the nursing notes.  HISTORY  Chief Complaint Emesis and Weakness   HPI Barbara Vega is a 87 y.o. femalewho presents to the ED for evaluation of nausea and generalized weakness.  Chart review indicates Htn, GERD.   Patient presents to the ED via EMS from home for evaluation of acute nausea and generalized weakness.  She reports feeling "just so sick" and persistently nauseous for the past 12 hours or so.  She denies any abdominal pain, just persistent nausea.  She reports feeling some dysuria this evening over the past couple hours without new incontinence, hematuria, fevers or flank pain.  Denies any emesis, just nausea.  Denies chest pain, shortness of breath, cough, dizziness, syncope, falls or injuries.  Past Medical History:  Diagnosis Date   Adenomatous colon polyp    Anemia    hx   Arthritis    Benign breast lumps    History of colon polyps    benign   Hypertension    takes Triamterene-HCTZ daily   Insomnia    takes Restoril nightly   Macular degeneration    dry   Osteoporosis    Sinus infection    taking Ceftin daily     Patient Active Problem List   Diagnosis Date Noted   Generalized weakness 06/15/2019   Hypokalemia 06/14/2019   Pain due to onychomycosis of toenails of both feet 06/07/2019   S/P kyphoplasty 06/01/2019   DDD (degenerative disc disease), lumbar 06/16/2017   SI (sacroiliac) joint dysfunction 06/16/2017   Incarcerated left inguinal hernia    Small bowel obstruction (HCC)    Hyponatremia 11/20/2016   GERD (gastroesophageal reflux disease) 09/20/2016   Dysphagia, unspecified 08/09/2016   Papilloma of breast 04/03/2015   Osteoporosis, post-menopausal 03/04/2015   Cannot sleep 03/04/2015   Hypertension 03/04/2015   Adenomatous colon polyp 03/04/2015     Past Surgical History:  Procedure Laterality Date   ABDOMINAL HYSTERECTOMY     BREAST EXCISIONAL BIOPSY     BREAST LUMPECTOMY Right 09/08/2015   intraductal papilloma   BREAST LUMPECTOMY Left    2 bx done only one scar seen years ago neg   cataract surgery Bilateral    COLONOSCOPY     COLONOSCOPY WITH PROPOFOL N/A 11/03/2016   Procedure: COLONOSCOPY WITH PROPOFOL;  Surgeon: Manya Silvas, MD;  Location: [facility] ENDOSCOPY;  Service: Endoscopy;  Laterality: N/A;   ESOPHAGOGASTRODUODENOSCOPY (EGD) WITH PROPOFOL N/A 11/03/2016   Procedure: ESOPHAGOGASTRODUODENOSCOPY (EGD) WITH PROPOFOL;  Surgeon: Manya Silvas, MD;  Location: [facility] ENDOSCOPY;  Service: Endoscopy;  Laterality: N/A;   INGUINAL HERNIA REPAIR Left 01/15/2017   Procedure: HERNIA REPAIR INGUINAL ADULT;  Surgeon: Florene Glen, MD;  Location: [facility] ORS;  Service: General;  Laterality: Left;   KYPHOPLASTY N/A 05/15/2019   Procedure: KYPHOPLASTY T12;  Surgeon: Hessie Knows, MD;  Location: [facility] ORS;  Service: Orthopedics;  Laterality: N/A;   KYPHOPLASTY N/A 06/01/2019   Procedure: T11 KYPHOPLASTY;  Surgeon: Hessie Knows, MD;  Location: [facility] ORS;  Service: Orthopedics;  Laterality: N/A;   RADIOACTIVE SEED GUIDED EXCISIONAL BREAST BIOPSY Right 09/08/2015   Procedure: RADIOACTIVE SEED GUIDED EXCISIONAL BREAST BIOPSY;  Surgeon: Rolm Bookbinder, MD;  Location: [facility];  Service: General;  Laterality: Right;    Prior to Admission medications   Medication Sig Start Date End Date Taking? Authorizing Provider  amLODipine (NORVASC) 2.5 MG  tablet  07/09/19   [provider]  cetirizine (ZYRTEC) 10 MG tablet Take 10 mg by mouth daily as needed for allergies. 04/17/19   [provider]  conjugated estrogens (PREMARIN) vaginal cream Place 1 Applicatorful vaginally daily. Use pea sized amount M-W-Fr before bedtime 01/26/19   Hollice Espy, MD  cyclobenzaprine (FLEXERIL) 5 MG tablet Take 0.5 tablets (2.5 mg total) by mouth 2  (two) times daily as needed for muscle spasms. 06/15/19   Fritzi Mandes, MD  diphenhydramine-acetaminophen (TYLENOL PM) 25-500 MG TABS tablet Take 1-2 tablets by mouth at bedtime.    [provider]  divalproex (DEPAKOTE) 125 MG DR tablet Take by mouth. 10/02/19   [provider]  docusate sodium (COLACE) 100 MG capsule Take 100 mg by mouth at bedtime as needed for mild constipation.    [provider]  famotidine (PEPCID) 20 MG tablet Take 1 tablet (20 mg total) by mouth 2 (two) times daily. 10/14/20   Carrie Mew, MD  feeding supplement, ENSURE ENLIVE, (ENSURE ENLIVE) LIQD Take 237 mLs by mouth 2 (two) times daily between meals. 06/15/19   Fritzi Mandes, MD  fluticasone (FLONASE) 50 MCG/ACT nasal spray Place 2 sprays into both nostrils daily as needed for allergies. 04/17/19   [provider]  metoCLOPramide (REGLAN) 10 MG tablet Take 0.5 tablets (5 mg total) by mouth every 6 (six) hours as needed. 10/14/20   Carrie Mew, MD  omeprazole (PRILOSEC OTC) 20 MG tablet Take 20 mg by mouth at bedtime.    [provider]  ondansetron (ZOFRAN ODT) 4 MG disintegrating tablet Take 1 tablet (4 mg total) by mouth every 8 (eight) hours as needed for nausea or vomiting. 08/27/19   Harvest Dark, MD  predniSONE (DELTASONE) 10 MG tablet  06/15/19   [provider]  temazepam (RESTORIL) 30 MG capsule Take 30 mg by mouth at bedtime.  09/13/14   [provider]    Allergies Codone [hydrocodone]  Family History  Problem Relation Age of Onset   Osteoporosis Mother    Heart failure Mother    Heart block Brother    Alcohol abuse Brother    Bladder Cancer Neg Hx    Kidney cancer Neg Hx     Social History Social History   Tobacco Use   Smoking status: Never   Smokeless tobacco: Never  Vaping Use   Vaping Use: Never used  Substance Use Topics   Alcohol use: No    Alcohol/week: 0.0 standard drinks   Drug use: No    Review of  Systems  Constitutional: No fever/chills.  Positive generalized weakness. Eyes: No visual changes. ENT: No sore throat. Cardiovascular: Denies chest pain. Respiratory: Denies shortness of breath. Gastrointestinal: No abdominal pain.  no vomiting.  No diarrhea.  No constipation. Positive for nausea Genitourinary: Negative for dysuria. Musculoskeletal: Negative for back pain. Skin: Negative for rash. Neurological: Negative for headaches, focal weakness or numbness.  ____________________________________________   PHYSICAL EXAM:  VITAL SIGNS: Vitals:   10/26/20 0520 10/26/20 0623  BP: (!) 165/70 (!) 168/79  Pulse: 80 81  Resp: 18 18  Temp:    SpO2: 94% 94%     Constitutional: Alert and oriented. Well appearing and in no acute distress. Eyes: Conjunctivae are normal. PERRL. EOMI. Head: Atraumatic. Nose: No congestion/rhinnorhea. Mouth/Throat: Mucous membranes are moist.  Oropharynx non-erythematous. Neck: No stridor. No cervical spine tenderness to palpation. Cardiovascular: Normal rate, regular rhythm. Grossly normal heart sounds.  Good peripheral circulation. Respiratory: Normal respiratory effort.  No retractions. Lungs CTAB. Gastrointestinal: Soft , nondistended, nontender to palpation. No CVA tenderness. Musculoskeletal: No lower extremity tenderness nor edema.  No joint effusions. No signs of acute trauma. Neurologic:  Normal speech and language. No gross focal neurologic deficits are appreciated. No gait instability noted. Skin:  Skin is warm, dry and intact. No rash noted. Psychiatric: Mood and affect are normal. Speech and behavior are normal.  ____________________________________________   LABS (all labs ordered are listed, but only abnormal results are displayed)  Labs Reviewed  COMPREHENSIVE METABOLIC PANEL - Abnormal; Notable for the following components:      Result Value   Sodium 124 (*)    Potassium 3.3 (*)    Chloride 92 (*)    Glucose, Bld 133 (*)     All other components within normal limits  CBC - Abnormal; Notable for the following components:   WBC 11.2 (*)    RBC 5.18 (*)    Hemoglobin 15.6 (*)    Platelets 593 (*)    All other components within normal limits  URINALYSIS, COMPLETE (UACMP) WITH MICROSCOPIC - Abnormal; Notable for the following components:   Color, Urine STRAW (*)    APPearance CLEAR (*)    Ketones, ur 5 (*)    Bacteria, UA RARE (*)    All other components within normal limits  RESP PANEL BY RT-PCR (FLU A&B, COVID) ARPGX2  LIPASE, BLOOD  MAGNESIUM  TROPONIN I (HIGH SENSITIVITY)  TROPONIN I (HIGH SENSITIVITY)   ____________________________________________  12 Lead EKG  Sinus rhythm, rate of 79 bpm.  Leftward axis and normal intervals.  No evidence of acute ischemia. ____________________________________________  RADIOLOGY  ED MD interpretation: CT abdomen/pelvis reviewed by me without evidence of acute intra-abdominal pathology.  Official radiology report(s): CT ABDOMEN PELVIS W CONTRAST  Result Date: 10/26/2020 CLINICAL DATA:  Acute nonlocalized abdominal pain with constipation and lower tenderness to palpation EXAM: CT ABDOMEN AND PELVIS WITH CONTRAST TECHNIQUE: Multidetector CT imaging of the abdomen and pelvis was performed using the standard protocol following bolus administration of intravenous contrast. CONTRAST:  70mL OMNIPAQUE IOHEXOL 300 MG/ML  SOLN COMPARISON:  08/31/2019 FINDINGS: Lower chest:  Small sliding hiatal hernia.  Dependent atelectasis. Hepatobiliary: Small left hepatic cystic densities.No evidence of biliary obstruction or stone. Pancreas: Unremarkable. Spleen: Unremarkable. Adrenals/Urinary Tract: 19 mm left adrenal mass that is stable and most consistent with adenoma. No hydronephrosis or stone. Unremarkable bladder. Stomach/Bowel: Proximal gastric diverticulum. No obstruction. No appendicitis. Distal colonic diverticulosis. Vascular/Lymphatic: No acute vascular abnormality.  Atheromatous calcification of the aorta. No mass or adenopathy. Reproductive:Hysterectomy. Other: No ascites or pneumoperitoneum. Musculoskeletal: No acute abnormalities. Lumbar spine degeneration with mild scoliosis. Remote T11 and T12 compression fractures with cement augmentation. IMPRESSION: No acute finding.  No bowel obstruction or visible inflammation. Chronic findings are described above. Electronically Signed   By: Monte Fantasia M.D.   On: 10/26/2020 05:44    ____________________________________________   PROCEDURES and INTERVENTIONS  Procedure(s) performed (including Critical Care):  .1-3 Lead EKG Interpretation  Date/Time: 10/26/2020 6:56 AM Performed by: Vladimir Crofts, MD Authorized by: Vladimir Crofts, MD     Interpretation: normal     ECG rate:  78   ECG rate assessment: normal     Rhythm: sinus rhythm     Ectopy: none     Conduction: normal    Medications  potassium chloride SA (KLOR-CON) CR tablet 40 mEq (has no administration in time range)  ondansetron (ZOFRAN-ODT) disintegrating tablet 4 mg (4 mg Oral Given 10/26/20 0435)  lactated ringers bolus 1,000 mL (1,000 mLs Intravenous New Bag/Given 10/26/20 0516)  famotidine (PEPCID) IVPB 20 mg premix (0 mg Intravenous Stopped 10/26/20 0604)  metoCLOPramide (REGLAN) injection 10 mg (10 mg Intravenous Given 10/26/20 0510)  iohexol (OMNIPAQUE) 300 MG/ML solution 75 mL (75 mLs Intravenous Contrast Given 10/26/20 0533)  droperidol (INAPSINE) 2.5 MG/ML injection 1.25 mg (1.25 mg Intravenous Given 10/26/20 0603)    ____________________________________________   MDM / ED COURSE   87 year old woman with minimal medical history presents to the ED with generalized weakness and nausea.  She has some mild electrolyte derangements with hypo-K and hyponatremia that we will replete orally and IV.  Improving nausea with IV fluids and antiemetics.  Urine without infectious features and CT imaging is reassuring without evidence of SBO or further  intra-abdominal pathology.  Patient signed out to oncoming provider to follow-up on her clinical picture after electrolyte repletion and IV fluids.  Clinical Course as of 10/26/20 0715  Sun Oct 26, 2020  0554 Reassessed after CT.  She reports persistence of nausea.  IV blew while in CT, and is being replaced by nursing.  We will provide additional antiemetics. [DS]  8333 Reassessed.  Patient is requesting water and reports some residual nausea.  We discussed largely benign work-up [DS]  0715 Patient signed out to oncoming provider. [DS]    Clinical Course User Index [DS] Vladimir Crofts, MD    ____________________________________________   FINAL CLINICAL IMPRESSION(S) / ED DIAGNOSES  Final diagnoses:  Generalized weakness  Nausea  Hyponatremia  Hypokalemia     ED Discharge Orders     None        Bernice Mullin Tamala Julian   Note:  This document was prepared using Dragon voice recognition software and may include unintentional dictation errors.    Vladimir Crofts, MD 10/26/20 [phone]

## 2020-10-26 NOTE — ED Triage Notes (Signed)
Pt to ED via [facility] with c/c of severe nausea beginning at lunchtime yesterday and generalized weakness. Pt denies any diarrhea or vomiting. She states there is a pressure in her chest that she feels would be relieved if she were able to vomit. Upon arrival, pt A&Ox4.

## 2020-10-27 DIAGNOSIS — F0280 Dementia in other diseases classified elsewhere without behavioral disturbance: Secondary | ICD-10-CM

## 2020-10-27 DIAGNOSIS — I10 Essential (primary) hypertension: Secondary | ICD-10-CM

## 2020-10-27 DIAGNOSIS — G309 Alzheimer's disease, unspecified: Secondary | ICD-10-CM

## 2020-10-27 DIAGNOSIS — E876 Hypokalemia: Secondary | ICD-10-CM

## 2020-10-27 DIAGNOSIS — E871 Hypo-osmolality and hyponatremia: Principal | ICD-10-CM

## 2020-10-27 LAB — BASIC METABOLIC PANEL
Anion gap: 5 (ref 5–15)
BUN: <5 mg/dL — ABNORMAL LOW (ref 8–23)
CO2: 27 mmol/L (ref 22–32)
Calcium: 9.1 mg/dL (ref 8.9–10.3)
Chloride: 102 mmol/L (ref 98–111)
Creatinine, Ser: 0.51 mg/dL (ref 0.44–1.00)
GFR, Estimated: >60 mL/min (ref >60)
Glucose, Bld: 98 mg/dL (ref 70–99)
Potassium: 3.8 mmol/L (ref 3.5–5.1)
Sodium: 134 mmol/L — ABNORMAL LOW (ref 135–145)

## 2020-10-27 LAB — CBC
HCT: 46.7 % — ABNORMAL HIGH (ref 36.0–46.0)
Hemoglobin: 15.7 g/dL — ABNORMAL HIGH (ref 12.0–15.0)
MCH: 29.1 pg (ref 26.0–34.0)
MCHC: 33.6 g/dL (ref 30.0–36.0)
MCV: 86.6 fL (ref 80.0–100.0)
Platelets: 534 10*3/uL — ABNORMAL HIGH (ref 150–400)
RBC: 5.39 MIL/uL — ABNORMAL HIGH (ref 3.87–5.11)
RDW: 12.8 % (ref 11.5–15.5)
WBC: 7.8 10*3/uL (ref 4.0–10.5)
nRBC: 0 % (ref 0.0–0.2)

## 2020-10-27 LAB — GLUCOSE, CAPILLARY: Glucose-Capillary: 89 mg/dL (ref 70–99)

## 2020-10-27 LAB — SODIUM: Sodium: 134 mmol/L — ABNORMAL LOW (ref 135–145)

## 2020-10-27 LAB — AMMONIA: Ammonia: 24 umol/L (ref 9–35)

## 2020-10-27 LAB — VALPROIC ACID LEVEL: Valproic Acid Lvl: 45 ug/mL — ABNORMAL LOW (ref 50.0–100.0)

## 2020-10-27 NOTE — Progress Notes (Signed)
Patient ID: Barbara Vega, female   DOB: Apr 29, 1934, 87 y.o.   MRN: [id number] [facility] PROGRESS NOTE  Shaquan Puerta Nishiyama KNL:[id number] DOB: 03-29-1934 DOA: 10/26/2020 PCP: [name], MD  HPI/Subjective: Patient feels okay.  Patient could not recall the details on why she came into the hospital.  She states that she lives alone.  In speaking with the patient's daughter, the patient was having headache for a few days and vomited and called 911.  The daughter also stated that sometimes the patient sneaks laxatives.  She has removed any laxatives from her house.  Objective: Vitals:   10/27/20 0757 10/27/20 1141  BP: (!) 149/75 (!) 150/89  Pulse: 72 81  Resp: 18 18  Temp: 97.7 F (36.5 C) 98.2 F (36.8 C)  SpO2: 95% 96%    Intake/Output Summary (Last 24 hours) at 10/27/2020 1307 Last data filed at 10/27/2020 1017 Gross per 24 hour  Intake 343.62 ml  Output --  Net 343.62 ml   Filed Weights   10/26/20 0330  Weight: 63.5 kg    ROS: Review of Systems  Respiratory:  Negative for shortness of breath.   Cardiovascular:  Negative for chest pain.  Gastrointestinal:  Negative for abdominal pain, nausea and vomiting.  Exam: Physical Exam HENT:     Head: Normocephalic.     Mouth/Throat:     Pharynx: No oropharyngeal exudate.  Eyes:     General: Lids are normal.     Conjunctiva/sclera: Conjunctivae normal.     Pupils: Pupils are equal, round, and reactive to light.  Cardiovascular:     Rate and Rhythm: Normal rate and regular rhythm.     Heart sounds: Normal heart sounds, S1 normal and S2 normal.  Pulmonary:     Breath sounds: Normal breath sounds. No decreased breath sounds, wheezing, rhonchi or rales.  Abdominal:     Palpations: Abdomen is soft.     Tenderness: There is no abdominal tenderness.  Musculoskeletal:     Right lower leg: No swelling.     Left lower leg: No swelling.  Skin:    General: Skin is warm.     Findings: No rash.  Neurological:     Mental  Status: She is alert.     Comments: Able to straight leg raise bilaterally.     Data Reviewed: Basic Metabolic Panel: Recent Labs  Lab 10/26/20 0337 10/27/20 0556  NA 124* 134*  K 3.3* 3.8  CL 92* 102  CO2 24 27  GLUCOSE 133* 98  BUN 8 <5*  CREATININE 0.46 0.51  CALCIUM 9.0 9.1  MG 2.0  --    Liver Function Tests: Recent Labs  Lab 10/26/20 0337  AST 19  ALT 7  ALKPHOS 72  BILITOT 0.9  PROT 6.5  ALBUMIN 4.2   Recent Labs  Lab 10/26/20 0337  LIPASE 31    CBC: Recent Labs  Lab 10/26/20 0337 10/27/20 0556  WBC 11.2* 7.8  HGB 15.6* 15.7*  HCT 45.2 46.7*  MCV 87.3 86.6  PLT 593* 534*     CBG: Recent Labs  Lab 10/27/20 0800  GLUCAP 89    Recent Results (from the past 240 hour(s))  Resp Panel by RT-PCR (Flu A&B, Covid) Nasopharyngeal Swab     Status: None   Collection Time: 10/26/20  8:23 AM   Specimen: Nasopharyngeal Swab; Nasopharyngeal(NP) swabs in vial transport medium  Result Value Ref Range Status   SARS Coronavirus 2 by RT PCR NEGATIVE NEGATIVE Final  Comment: (NOTE) SARS-CoV-2 target nucleic acids are NOT DETECTED.  The SARS-CoV-2 RNA is generally detectable in upper respiratory specimens during the acute phase of infection. The lowest concentration of SARS-CoV-2 viral copies this assay can detect is 138 copies/mL. A negative result does not preclude SARS-Cov-2 infection and should not be used as the sole basis for treatment or other patient management decisions. A negative result may occur with  improper specimen collection/handling, submission of specimen other than nasopharyngeal swab, presence of viral mutation(s) within the areas targeted by this assay, and inadequate number of viral copies(<138 copies/mL). A negative result must be combined with clinical observations, patient history, and epidemiological information. The expected result is Negative.  Fact Sheet for Patients:  EntrepreneurPulse.com.au  Fact  Sheet for Healthcare Providers:  IncredibleEmployment.be  This test is no t yet approved or cleared by the [place] FDA and  has been authorized for detection and/or diagnosis of SARS-CoV-2 by FDA under an Emergency Use Authorization (EUA). This EUA will remain  in effect (meaning this test can be used) for the duration of the COVID-19 declaration under Section 564(b)(1) of the Act, 21 U.S.C.section 360bbb-3(b)(1), unless the authorization is terminated  or revoked sooner.       Influenza A by PCR NEGATIVE NEGATIVE Final   Influenza B by PCR NEGATIVE NEGATIVE Final    Comment: (NOTE) The Xpert Xpress SARS-CoV-2/FLU/RSV plus assay is intended as an aid in the diagnosis of influenza from Nasopharyngeal swab specimens and should not be used as a sole basis for treatment. Nasal washings and aspirates are unacceptable for Xpert Xpress SARS-CoV-2/FLU/RSV testing.  Fact Sheet for Patients: EntrepreneurPulse.com.au  Fact Sheet for Healthcare Providers: IncredibleEmployment.be  This test is not yet approved or cleared by the [place] FDA and has been authorized for detection and/or diagnosis of SARS-CoV-2 by FDA under an Emergency Use Authorization (EUA). This EUA will remain in effect (meaning this test can be used) for the duration of the COVID-19 declaration under Section 564(b)(1) of the Act, 21 U.S.C. section 360bbb-3(b)(1), unless the authorization is terminated or revoked.  Performed at [facility], [address], [place], [place] [address]      Studies: CT HEAD WO CONTRAST  Result Date: 10/26/2020 CLINICAL DATA:  Possible stroke. Severe nausea beginning yesterday with generalized weakness. EXAM: CT HEAD WITHOUT CONTRAST TECHNIQUE: Contiguous axial images were obtained from the base of the skull through the vertex without intravenous contrast. COMPARISON:  09/14/2017 FINDINGS: Brain: Ventricles,  cisterns and other CSF spaces are normal. There is chronic ischemic microvascular disease present. There is no mass, mass effect, shift of midline structures or acute hemorrhage. No evidence of acute infarction. Vascular: No hyperdense vessel or unexpected calcification. Skull: Normal. Negative for fracture or focal lesion. Sinuses/Orbits: No acute finding. Other: None. IMPRESSION: 1. No acute findings. 2. Chronic ischemic microvascular disease. Electronically Signed   By: Marin Olp M.D.   On: 10/26/2020 12:50   CT ABDOMEN PELVIS W CONTRAST  Result Date: 10/26/2020 CLINICAL DATA:  Acute nonlocalized abdominal pain with constipation and lower tenderness to palpation EXAM: CT ABDOMEN AND PELVIS WITH CONTRAST TECHNIQUE: Multidetector CT imaging of the abdomen and pelvis was performed using the standard protocol following bolus administration of intravenous contrast. CONTRAST:  35mL OMNIPAQUE IOHEXOL 300 MG/ML  SOLN COMPARISON:  08/31/2019 FINDINGS: Lower chest:  Small sliding hiatal hernia.  Dependent atelectasis. Hepatobiliary: Small left hepatic cystic densities.No evidence of biliary obstruction or stone. Pancreas: Unremarkable. Spleen: Unremarkable. Adrenals/Urinary Tract: 19 mm left adrenal  mass that is stable and most consistent with adenoma. No hydronephrosis or stone. Unremarkable bladder. Stomach/Bowel: Proximal gastric diverticulum. No obstruction. No appendicitis. Distal colonic diverticulosis. Vascular/Lymphatic: No acute vascular abnormality. Atheromatous calcification of the aorta. No mass or adenopathy. Reproductive:Hysterectomy. Other: No ascites or pneumoperitoneum. Musculoskeletal: No acute abnormalities. Lumbar spine degeneration with mild scoliosis. Remote T11 and T12 compression fractures with cement augmentation. IMPRESSION: No acute finding.  No bowel obstruction or visible inflammation. Chronic findings are described above. Electronically Signed   By: Monte Fantasia M.D.   On:  10/26/2020 05:44   DG Chest Portable 1 View  Result Date: 10/26/2020 CLINICAL DATA:  Shortness of breath. Generalized weakness. Hypertension. EXAM: PORTABLE CHEST 1 VIEW COMPARISON:  10/14/2020 FINDINGS: Patient rotated right. Midline trachea. Mild cardiomegaly. Small hiatal hernia. No pleural effusion or pneumothorax. Clear lungs. IMPRESSION: No acute cardiopulmonary disease. Cardiomegaly without congestive failure. Small hiatal hernia. Electronically Signed   By: Abigail Miyamoto M.D.   On: 10/26/2020 07:31    Scheduled Meds:  amLODipine  2.5 mg Oral Daily   divalproex  250 mg Oral Daily   donepezil  5 mg Oral QHS   multivitamin-lutein  1 capsule Oral QHS   pantoprazole (PROTONIX) IV  40 mg Intravenous Q24H   potassium chloride  40 mEq Oral Once   sodium chloride flush  3 mL Intravenous Q12H   temazepam  30 mg Oral QHS   Continuous Infusions:  sodium chloride      Assessment/Plan:  Hyponatremia and hypokalemia.  As per the patient's daughter she does sneak laxatives at home.  Improved quite a bit with IV fluids.  Will discontinue IV fluids.  Recheck labs today and again tomorrow. Dementia without behavioral disturbance on Aricept.  We will check ammonia level since on Depakote.  B12 level last year normal range. Essential hypertension on Norvasc GERD and hiatal hernia on Protonix Physical therapy evaluation      Code Status:     Code Status Orders  (From admission, onward)           Start     Ordered   10/26/20 1045  Full code  Continuous        10/26/20 1046           Code Status History     Date Active Date Inactive Code Status Order ID Comments User Context   06/14/2019 2044 06/15/2019 2034 DNR [id number]  Lenore Cordia, MD ED   06/01/2019 1544 06/02/2019 1700 Full Code [id number]  Hessie Knows, MD Inpatient   06/01/2019 1544 06/01/2019 1544 Full Code [id number]  Hessie Knows, MD Inpatient   05/15/2019 1506 05/15/2019 2144 Full Code [id number]  Hessie Knows, MD  Inpatient   02/05/2017 1142 02/06/2017 1540 Full Code [id number]  Henreitta Leber, MD Inpatient   01/15/2017 0851 01/16/2017 1431 Full Code [id number]  Florene Glen, MD Inpatient   11/20/2016 0824 11/21/2016 1643 Full Code [id number]  Loletha Grayer, MD ED      Family Communication: Spoke with the patient's daughter on the phone Disposition Plan: Status is: Inpatient  Dispo: The patient is from: Home              Anticipated d/c is to: Home likely 10/28/2020              Patient currently had an abrupt rise in sodium we will check today and again tomorrow morning.  Continue to watch mental status.   Difficult to place patient.  No.  Time spent: 16  minutes.  [place]  [facility] [employer]

## 2020-10-27 NOTE — Plan of Care (Signed)
Patient had an uneventful shift. No changes in neurological and neurovascular assessments. Pain controlled with PRN medications. Nausea controlled with scheduled Reglan.Vital signs within normal range.Denies any needs at this time. Patient states that she feels better this morning.All Safety measures maintained. Care continues.  Problem: Education: Goal: Knowledge of General Education information will improve Description: Including pain rating scale, medication(s)/side effects and non-pharmacologic comfort measures Outcome: Progressing   Problem: Health Behavior/Discharge Planning: Goal: Ability to manage health-related needs will improve Outcome: Progressing   Problem: Clinical Measurements: Goal: Ability to maintain clinical measurements within normal limits will improve Outcome: Progressing Goal: Will remain free from infection Outcome: Progressing Goal: Diagnostic test results will improve Outcome: Progressing Goal: Respiratory complications will improve Outcome: Progressing Goal: Cardiovascular complication will be avoided Outcome: Progressing   Problem: Activity: Goal: Risk for activity intolerance will decrease Outcome: Progressing   Problem: Nutrition: Goal: Adequate nutrition will be maintained Outcome: Progressing   Problem: Coping: Goal: Level of anxiety will decrease Outcome: Progressing   Problem: Elimination: Goal: Will not experience complications related to bowel motility Outcome: Progressing Goal: Will not experience complications related to urinary retention Outcome: Progressing   Problem: Pain Managment: Goal: General experience of comfort will improve Outcome: Progressing   Problem: Safety: Goal: Ability to remain free from injury will improve Outcome: Progressing   Problem: Skin Integrity: Goal: Risk for impaired skin integrity will decrease Outcome: Progressing

## 2020-10-27 NOTE — Evaluation (Signed)
Physical Therapy Evaluation Patient Details Name: Barbara Vega MRN: [id number] DOB: January 16, 1934 Today's Date: 10/27/2020   History of Present Illness  Barbara Vega is a 87 y.o. female with medical history significant for hypertension, macular degeneration who presents to the emergency room via EMS for evaluation of nausea.  Patient states that she ate some peanuts the day prior to her admission and since then has had " indigestion"  and persistent nausea.  She has been unable to tolerate any oral intake.  She denies having any emesis.  She complains of a headache as well as dizziness.  Clinical Impression  Patient received in bed, alert, pleasant, agrees to PT assessment. She reports she is feeling much better. No nausea this day. Wants to go home. She is independent with bed mobility and transfers, ambulated 300 feet with min guard/supervision. No unsteadiness noted or difficulty with mobility. Patient appears to be at baseline level of function. No further PT needs at this time. Will sign off.     Follow Up Recommendations No PT follow up    Equipment Recommendations  None recommended by PT    Recommendations for Other Services       Precautions / Restrictions Precautions Precautions: Fall Restrictions Weight Bearing Restrictions: No      Mobility  Bed Mobility Overal bed mobility: Independent                  Transfers Overall transfer level: Independent Equipment used: None                Ambulation/Gait Ambulation/Gait assistance: Supervision Gait Distance (Feet): 300 Feet Assistive device: None Gait Pattern/deviations: Step-through pattern Gait velocity: WNL   General Gait Details: generally safe with mobility, no unsteadiness noted.  Stairs            Wheelchair Mobility    Modified Rankin (Stroke Patients Only)       Balance Overall balance assessment: Independent                                            Pertinent Vitals/Pain Pain Assessment: No/denies pain    Home Living Family/patient expects to be discharged to:: Private residence Living Arrangements: Alone Available Help at Discharge: Family;Available PRN/intermittently Type of Home: House Home Access: Stairs to enter Entrance Stairs-Rails: Right   Home Layout: One level Home Equipment: Walker - 2 wheels;Cane - single point      Prior Function Level of Independence: Independent         Comments: lives alone, does not use AD. Family checks in regularly, Daughter does shopping.     Hand Dominance        Extremity/Trunk Assessment   Upper Extremity Assessment Upper Extremity Assessment: Overall WFL for tasks assessed    Lower Extremity Assessment Lower Extremity Assessment: Overall WFL for tasks assessed    Cervical / Trunk Assessment Cervical / Trunk Assessment: Normal  Communication   Communication: HOH  Cognition Arousal/Alertness: Awake/alert Behavior During Therapy: WFL for tasks assessed/performed Overall Cognitive Status: Within Functional Limits for tasks assessed                                        General Comments      Exercises     Assessment/Plan  PT Assessment Patent does not need any further PT services  PT Problem List         PT Treatment Interventions      PT Goals (Current goals can be found in the Care Plan section)  Acute Rehab PT Goals Patient Stated Goal: return home tomorrow PT Goal Formulation: With patient Time For Goal Achievement: 10/28/20 Potential to Achieve Goals: Good    Frequency     Barriers to discharge        Co-evaluation               AM-PAC PT "6 Clicks" Mobility  Outcome Measure Help needed turning from your back to your side while in a flat bed without using bedrails?: None Help needed moving from lying on your back to sitting on the side of a flat bed without using bedrails?: None Help needed moving to and from  a bed to a chair (including a wheelchair)?: None Help needed standing up from a chair using your arms (e.g., wheelchair or bedside chair)?: None Help needed to walk in hospital room?: None Help needed climbing 3-5 steps with a railing? : A Little 6 Click Score: 23    End of Session Equipment Utilized During Treatment: Gait belt Activity Tolerance: Patient tolerated treatment well Patient left: in chair;with call bell/phone within reach;with chair alarm set Nurse Communication: Mobility status PT Visit Diagnosis: Difficulty in walking, not elsewhere classified (R26.2)    Time: 1430-1456 PT Time Calculation (min) (ACUTE ONLY): 26 min   Charges:   PT Evaluation $PT Eval Low Complexity: 1 Low PT Treatments $Gait Training: 8-22 mins        [employer], PT, [employer] 10/27/20,3:06 PM

## 2020-10-28 LAB — BASIC METABOLIC PANEL
Anion gap: 7 (ref 5–15)
BUN: 14 mg/dL (ref 8–23)
CO2: 26 mmol/L (ref 22–32)
Calcium: 9 mg/dL (ref 8.9–10.3)
Chloride: 101 mmol/L (ref 98–111)
Creatinine, Ser: 0.61 mg/dL (ref 0.44–1.00)
GFR, Estimated: >60 mL/min (ref >60)
Glucose, Bld: 89 mg/dL (ref 70–99)
Potassium: 3.6 mmol/L (ref 3.5–5.1)
Sodium: 134 mmol/L — ABNORMAL LOW (ref 135–145)

## 2020-10-28 MED ORDER — POTASSIUM CHLORIDE CRYS ER 20 MEQ PO TBCR: 20.0000 meq | EXTENDED_RELEASE_TABLET | Freq: Every day | ORAL | 0 refills | Status: DC

## 2020-10-28 MED ORDER — POTASSIUM CHLORIDE CRYS ER 20 MEQ PO TBCR
20.0000 meq | EXTENDED_RELEASE_TABLET | Freq: Every day | ORAL | Status: DC
  Administered 2020-10-28: 20 meq via ORAL
  Filled 2020-10-28: qty 1

## 2020-10-28 MED ORDER — PANTOPRAZOLE SODIUM 40 MG PO TBEC: 40.0000 mg | DELAYED_RELEASE_TABLET | Freq: Every day | ORAL | 0 refills | Status: DC

## 2020-10-28 NOTE — Progress Notes (Signed)
Avs/discharge instructions reviewed with pt. New scripts sent to [facility] on [address]. Pt to f/u with pcp. Iv dc'd tip intact. Vss, no signs of distress.

## 2020-10-28 NOTE — Discharge Instructions (Signed)
Keep a bowel movement log for you PMD and daughter to determine when a laxative is needed

## 2020-10-28 NOTE — Discharge Summary (Signed)
[facility] NAME: Barbara Vega    MR#:  [id number]  DATE OF BIRTH:  09/08/1933  DATE OF ADMISSION:  10/26/2020 ADMITTING PHYSICIAN: Collier Bullock, MD  DATE OF DISCHARGE: 10/28/2020 11:33 AM  PRIMARY CARE PHYSICIAN: [name], MD    ADMISSION DIAGNOSIS:  Hypokalemia [E87.6] Hyponatremia [E87.1] Nausea [R11.0] Generalized weakness [R53.1]  DISCHARGE DIAGNOSIS:  Principal Problem:   Hiatal hernia with GERD Active Problems:   Essential hypertension   Hyponatremia   Hypokalemia   Generalized weakness   Alzheimer's dementia without behavioral disturbance ([place])   SECONDARY DIAGNOSIS:   Past Medical History:  Diagnosis Date  . Adenomatous colon polyp   . Anemia    hx  . Arthritis   . Benign breast lumps   . History of colon polyps    benign  . Hypertension    takes Triamterene-HCTZ daily  . Insomnia    takes Restoril nightly  . Macular degeneration    dry  . Osteoporosis   . Sinus infection    taking Ceftin daily     HOSPITAL COURSE:   Hyponatremia and hypokalemia.  Patient was given IV fluids and sodium abruptly increased and potassium was replaced.  IV fluids were held and her sodium level has held at 134.  Patient was admitted with a sodium of 124 and potassium of 3.3 initially.  Potassium 3.6 now we will give potassium supplementation for a few more days upon going home.  As per the daughter the patient does take laxatives at home.  I asked the patient to have a bowel movement log and show that to her daughter and have the daughter give laxatives only if needed.  The patient's daughter states that she has taken the laxatives away.  Recommend close follow-up of electrolytes as outpatient. Dementia without behavioral disturbance.  Continue Aricept.  Ammonia level normal.  Depakote level just below the therapeutic range.  B12 level as outpatient last year normal range. Essential hypertension on Norvasc GERD  and hiatal hernia on Protonix Patient did well with physical therapy. Advised the patient's daughter that in the future patient may need more help at home and more supervision.  DISCHARGE CONDITIONS:   Satisfactory  CONSULTS OBTAINED:  None  DRUG ALLERGIES:   Allergies  Allergen Reactions  . Codone [Hydrocodone] Nausea And Vomiting    DISCHARGE MEDICATIONS:   Allergies as of 10/28/2020       Reactions   Codone [hydrocodone] Nausea And Vomiting        Medication List     STOP taking these medications    famotidine 20 MG tablet Commonly known as: PEPCID   metoCLOPramide 10 MG tablet Commonly known as: REGLAN       TAKE these medications    amLODipine 2.5 MG tablet Commonly known as: NORVASC Take 2.5 mg by mouth daily.   divalproex 250 MG DR tablet Commonly known as: DEPAKOTE Take 250 mg by mouth daily.   donepezil 5 MG tablet Commonly known as: ARICEPT Take 5 mg by mouth at bedtime.   pantoprazole 40 MG tablet Commonly known as: Protonix Take 1 tablet (40 mg total) by mouth daily.   potassium chloride SA 20 MEQ tablet Commonly known as: KLOR-CON Take 1 tablet (20 mEq total) by mouth daily for 5 days. Start taking on: October 29, 2020   PreserVision AREDS 2+Multi Vit Caps Take 1 capsule by mouth at bedtime.   temazepam 30 MG capsule Commonly known as: RESTORIL  Take 30 mg by mouth at bedtime.         DISCHARGE INSTRUCTIONS:   Follow-up PMD 5 days  If you experience worsening of your admission symptoms, develop shortness of breath, life threatening emergency, suicidal or homicidal thoughts you must seek medical attention immediately by calling 911 or calling your MD immediately  if symptoms less severe.  You Must read complete instructions/literature along with all the possible adverse reactions/side effects for all the Medicines you take and that have been prescribed to you. Take any new Medicines after you have completely understood and  accept all the possible adverse reactions/side effects.   Please note  You were cared for by a hospitalist during your hospital stay. If you have any questions about your discharge medications or the care you received while you were in the hospital after you are discharged, you can call the unit and asked to speak with the hospitalist on call if the hospitalist that took care of you is not available. Once you are discharged, your primary care physician will handle any further medical issues. Please note that NO REFILLS for any discharge medications will be authorized once you are discharged, as it is imperative that you return to your primary care physician (or establish a relationship with a primary care physician if you do not have one) for your aftercare needs so that they can reassess your need for medications and monitor your lab values.    Today   CHIEF COMPLAINT:   Chief Complaint  Patient presents with  . Emesis  . Weakness    HISTORY OF PRESENT ILLNESS:  Barbara Vega  is a 87 y.o. female with a known history of came in with vomiting and weakness.   VITAL SIGNS:  Blood pressure 123/71, pulse 87, temperature 98.5 F (36.9 C), resp. rate 16, height 5' 2.5" (1.588 m), weight 63.5 kg, SpO2 93 %.  I/O:   Intake/Output Summary (Last 24 hours) at 10/28/2020 1724 Last data filed at 10/28/2020 1018 Gross per 24 hour  Intake 360 ml  Output --  Net 360 ml    PHYSICAL EXAMINATION:  GENERAL:  87 y.o.-year-old patient lying in the bed with no acute distress.  EYES: Pupils equal, round, reactive to light and accommodation. No scleral icterus. Extraocular muscles intact.  HEENT: Head atraumatic, normocephalic. Oropharynx and nasopharynx clear.  LUNGS: Normal breath sounds bilaterally, no wheezing, rales,rhonchi or crepitation. No use of accessory muscles of respiration.  CARDIOVASCULAR: S1, S2 normal. No murmurs, rubs, or gallops.  ABDOMEN: Soft, non-tender, non-distended. Bowel  sounds present. No organomegaly or mass.  EXTREMITIES: No pedal edema.  NEUROLOGIC: Cranial nerves II through XII are intact. Muscle strength 5/5 in all extremities. Sensation intact. Gait not checked.  PSYCHIATRIC: The patient is alert and oriented x 3.  SKIN: No obvious rash, lesion, or ulcer.   DATA REVIEW:   CBC Recent Labs  Lab 10/27/20 0556  WBC 7.8  HGB 15.7*  HCT 46.7*  PLT 534*    Chemistries  Recent Labs  Lab 10/26/20 0337 10/27/20 0556 10/28/20 0414  NA 124*   < > 134*  K 3.3*   < > 3.6  CL 92*   < > 101  CO2 24   < > 26  GLUCOSE 133*   < > 89  BUN 8   < > 14  CREATININE 0.46   < > 0.61  CALCIUM 9.0   < > 9.0  MG 2.0  --   --  AST 19  --   --   ALT 7  --   --   ALKPHOS 72  --   --   BILITOT 0.9  --   --    < > = values in this interval not displayed.     Microbiology Results  Results for orders placed or performed during the hospital encounter of 10/26/20  Resp Panel by RT-PCR (Flu A&B, Covid) Nasopharyngeal Swab     Status: None   Collection Time: 10/26/20  8:23 AM   Specimen: Nasopharyngeal Swab; Nasopharyngeal(NP) swabs in vial transport medium  Result Value Ref Range Status   SARS Coronavirus 2 by RT PCR NEGATIVE NEGATIVE Final    Comment: (NOTE) SARS-CoV-2 target nucleic acids are NOT DETECTED.  The SARS-CoV-2 RNA is generally detectable in upper respiratory specimens during the acute phase of infection. The lowest concentration of SARS-CoV-2 viral copies this assay can detect is 138 copies/mL. A negative result does not preclude SARS-Cov-2 infection and should not be used as the sole basis for treatment or other patient management decisions. A negative result may occur with  improper specimen collection/handling, submission of specimen other than nasopharyngeal swab, presence of viral mutation(s) within the areas targeted by this assay, and inadequate number of viral copies(<138 copies/mL). A negative result must be combined with clinical  observations, patient history, and epidemiological information. The expected result is Negative.  Fact Sheet for Patients:  EntrepreneurPulse.com.au  Fact Sheet for Healthcare Providers:  IncredibleEmployment.be  This test is no t yet approved or cleared by the [place] FDA and  has been authorized for detection and/or diagnosis of SARS-CoV-2 by FDA under an Emergency Use Authorization (EUA). This EUA will remain  in effect (meaning this test can be used) for the duration of the COVID-19 declaration under Section 564(b)(1) of the Act, 21 U.S.C.section 360bbb-3(b)(1), unless the authorization is terminated  or revoked sooner.       Influenza A by PCR NEGATIVE NEGATIVE Final   Influenza B by PCR NEGATIVE NEGATIVE Final    Comment: (NOTE) The Xpert Xpress SARS-CoV-2/FLU/RSV plus assay is intended as an aid in the diagnosis of influenza from Nasopharyngeal swab specimens and should not be used as a sole basis for treatment. Nasal washings and aspirates are unacceptable for Xpert Xpress SARS-CoV-2/FLU/RSV testing.  Fact Sheet for Patients: EntrepreneurPulse.com.au  Fact Sheet for Healthcare Providers: IncredibleEmployment.be  This test is not yet approved or cleared by the [place] FDA and has been authorized for detection and/or diagnosis of SARS-CoV-2 by FDA under an Emergency Use Authorization (EUA). This EUA will remain in effect (meaning this test can be used) for the duration of the COVID-19 declaration under Section 564(b)(1) of the Act, 21 U.S.C. section 360bbb-3(b)(1), unless the authorization is terminated or revoked.  Performed at [facility], [address], [place], [place] [address]      Management plans discussed with the patient, family and they are in agreement.  CODE STATUS:  Code Status History     Date Active Date Inactive Code Status Order ID Comments User  Context   10/26/2020 1046 10/28/2020 1718 Full Code [id number]  Collier Bullock, MD ED   06/14/2019 2044 06/15/2019 2034 DNR [id number]  Lenore Cordia, MD ED   06/01/2019 1544 06/02/2019 1700 Full Code [id number]  Hessie Knows, MD Inpatient   06/01/2019 1544 06/01/2019 1544 Full Code [id number]  Hessie Knows, MD Inpatient   05/15/2019 1506 05/15/2019 2144 Full Code [id number]  Hessie Knows, MD Inpatient  02/05/2017 1142 02/06/2017 1540 Full Code [id number]  Henreitta Leber, MD Inpatient   01/15/2017 0851 01/16/2017 1431 Full Code [id number]  Florene Glen, MD Inpatient   11/20/2016 0824 11/21/2016 1643 Full Code [id number]  Loletha Grayer, MD ED      Questions for Most Recent Historical Code Status (Order [id number])        TOTAL TIME TAKING CARE OF THIS PATIENT:  35 minutes.    Loletha Grayer M.D on 10/28/2020 at 5:24 PM    [facility]  CC: Primary care physician; [name], MD

## 2021-03-26 ENCOUNTER — Inpatient Hospital Stay
Admission: EM | Admit: 2021-03-26 | Discharge: 2021-03-28 | DRG: 064 | Disposition: A | Discharge status: Home Health | Payer: Medicare Other | Attending: Internal Medicine | Admitting: Internal Medicine

## 2021-03-26 ENCOUNTER — Emergency Department: Payer: Medicare Other

## 2021-03-26 ENCOUNTER — Other Ambulatory Visit: Payer: Self-pay

## 2021-03-26 DIAGNOSIS — M1990 Unspecified osteoarthritis, unspecified site: Secondary | ICD-10-CM | POA: Diagnosis present

## 2021-03-26 DIAGNOSIS — R202 Paresthesia of skin: Secondary | ICD-10-CM | POA: Diagnosis present

## 2021-03-26 DIAGNOSIS — Z20822 Contact with and (suspected) exposure to covid-19: Secondary | ICD-10-CM | POA: Diagnosis present

## 2021-03-26 DIAGNOSIS — F02818 Dementia in other diseases classified elsewhere, unspecified severity, with other behavioral disturbance: Secondary | ICD-10-CM | POA: Diagnosis present

## 2021-03-26 DIAGNOSIS — Z8262 Family history of osteoporosis: Secondary | ICD-10-CM | POA: Diagnosis not present

## 2021-03-26 DIAGNOSIS — I10 Essential (primary) hypertension: Secondary | ICD-10-CM | POA: Diagnosis present

## 2021-03-26 DIAGNOSIS — Z885 Allergy status to narcotic agent status: Secondary | ICD-10-CM | POA: Diagnosis not present

## 2021-03-26 DIAGNOSIS — Z90710 Acquired absence of both cervix and uterus: Secondary | ICD-10-CM | POA: Diagnosis not present

## 2021-03-26 DIAGNOSIS — G936 Cerebral edema: Secondary | ICD-10-CM | POA: Diagnosis present

## 2021-03-26 DIAGNOSIS — K219 Gastro-esophageal reflux disease without esophagitis: Secondary | ICD-10-CM | POA: Diagnosis present

## 2021-03-26 DIAGNOSIS — Z86010 Personal history of colonic polyps: Secondary | ICD-10-CM

## 2021-03-26 DIAGNOSIS — Z8249 Family history of ischemic heart disease and other diseases of the circulatory system: Secondary | ICD-10-CM | POA: Diagnosis not present

## 2021-03-26 DIAGNOSIS — G4700 Insomnia, unspecified: Secondary | ICD-10-CM | POA: Diagnosis present

## 2021-03-26 DIAGNOSIS — I639 Cerebral infarction, unspecified: Secondary | ICD-10-CM | POA: Diagnosis not present

## 2021-03-26 DIAGNOSIS — G309 Alzheimer's disease, unspecified: Secondary | ICD-10-CM | POA: Diagnosis present

## 2021-03-26 DIAGNOSIS — H9190 Unspecified hearing loss, unspecified ear: Secondary | ICD-10-CM | POA: Diagnosis present

## 2021-03-26 DIAGNOSIS — R29702 NIHSS score 2: Secondary | ICD-10-CM | POA: Diagnosis present

## 2021-03-26 DIAGNOSIS — I6381 Other cerebral infarction due to occlusion or stenosis of small artery: Principal | ICD-10-CM | POA: Diagnosis present

## 2021-03-26 DIAGNOSIS — Z79899 Other long term (current) drug therapy: Secondary | ICD-10-CM | POA: Diagnosis not present

## 2021-03-26 DIAGNOSIS — Z8719 Personal history of other diseases of the digestive system: Secondary | ICD-10-CM | POA: Diagnosis not present

## 2021-03-26 DIAGNOSIS — R531 Weakness: Secondary | ICD-10-CM | POA: Diagnosis present

## 2021-03-26 DIAGNOSIS — M810 Age-related osteoporosis without current pathological fracture: Secondary | ICD-10-CM | POA: Diagnosis present

## 2021-03-26 DIAGNOSIS — H3530 Unspecified macular degeneration: Secondary | ICD-10-CM | POA: Diagnosis present

## 2021-03-26 LAB — COMPREHENSIVE METABOLIC PANEL
ALT: 8 U/L (ref 0–44)
ALT: 9 U/L (ref 0–44)
AST: 19 U/L (ref 15–41)
AST: 20 U/L (ref 15–41)
Albumin: 3.9 g/dL (ref 3.5–5.0)
Albumin: 4 g/dL (ref 3.5–5.0)
Alkaline Phosphatase: 57 U/L (ref 38–126)
Alkaline Phosphatase: 51 U/L (ref 38–126)
Anion gap: 8 (ref 5–15)
Anion gap: 6 (ref 5–15)
BUN: 13 mg/dL (ref 8–23)
BUN: 10 mg/dL (ref 8–23)
CO2: 25 mmol/L (ref 22–32)
CO2: 27 mmol/L (ref 22–32)
Calcium: 9.2 mg/dL (ref 8.9–10.3)
Calcium: 8.9 mg/dL (ref 8.9–10.3)
Chloride: 102 mmol/L (ref 98–111)
Chloride: 100 mmol/L (ref 98–111)
Creatinine, Ser: 0.7 mg/dL (ref 0.44–1.00)
Creatinine, Ser: 0.55 mg/dL (ref 0.44–1.00)
GFR, Estimated: >60 mL/min (ref >60)
GFR, Estimated: >60 mL/min (ref >60)
Glucose, Bld: 116 mg/dL — ABNORMAL HIGH (ref 70–99)
Glucose, Bld: 126 mg/dL — ABNORMAL HIGH (ref 70–99)
Potassium: 3.6 mmol/L (ref 3.5–5.1)
Potassium: 3.8 mmol/L (ref 3.5–5.1)
Sodium: 135 mmol/L (ref 135–145)
Sodium: 133 mmol/L — ABNORMAL LOW (ref 135–145)
Total Bilirubin: 0.8 mg/dL (ref 0.3–1.2)
Total Bilirubin: 0.9 mg/dL (ref 0.3–1.2)
Total Protein: 7 g/dL (ref 6.5–8.1)
Total Protein: 6.7 g/dL (ref 6.5–8.1)

## 2021-03-26 LAB — RESP PANEL BY RT-PCR (FLU A&B, COVID) ARPGX2
Influenza A by PCR: NEGATIVE
Influenza B by PCR: NEGATIVE
SARS Coronavirus 2 by RT PCR: NEGATIVE

## 2021-03-26 LAB — DIFFERENTIAL
Abs Immature Granulocytes: 0.02 10*3/uL (ref 0.00–0.07)
Basophils Absolute: 0.1 10*3/uL (ref 0.0–0.1)
Basophils Relative: 2 %
Eosinophils Absolute: 0.1 10*3/uL (ref 0.0–0.5)
Eosinophils Relative: 1 %
Immature Granulocytes: 0 %
Lymphocytes Relative: 28 %
Lymphs Abs: 1.9 10*3/uL (ref 0.7–4.0)
Monocytes Absolute: 0.6 10*3/uL (ref 0.1–1.0)
Monocytes Relative: 9 %
Neutro Abs: 4 10*3/uL (ref 1.7–7.7)
Neutrophils Relative %: 60 %

## 2021-03-26 LAB — CBC
HCT: 48.9 % — ABNORMAL HIGH (ref 36.0–46.0)
Hemoglobin: 16.5 g/dL — ABNORMAL HIGH (ref 12.0–15.0)
MCH: 30.3 pg (ref 26.0–34.0)
MCHC: 33.7 g/dL (ref 30.0–36.0)
MCV: 89.9 fL (ref 80.0–100.0)
Platelets: 534 10*3/uL — ABNORMAL HIGH (ref 150–400)
RBC: 5.44 MIL/uL — ABNORMAL HIGH (ref 3.87–5.11)
RDW: 11.9 % (ref 11.5–15.5)
WBC: 6.6 10*3/uL (ref 4.0–10.5)
nRBC: 0 % (ref 0.0–0.2)

## 2021-03-26 LAB — PHOSPHORUS: Phosphorus: 2.7 mg/dL (ref 2.5–4.6)

## 2021-03-26 LAB — APTT: aPTT: 30 seconds (ref 24–36)

## 2021-03-26 LAB — VALPROIC ACID LEVEL: Valproic Acid Lvl: 52 ug/mL (ref 50.0–100.0)

## 2021-03-26 LAB — TSH: TSH: 2.114 u[IU]/mL (ref 0.350–4.500)

## 2021-03-26 LAB — PROTIME-INR
INR: 1.1 (ref 0.8–1.2)
Prothrombin Time: 14 seconds (ref 11.4–15.2)

## 2021-03-26 LAB — T4, FREE: Free T4: 0.9 ng/dL (ref 0.61–1.12)

## 2021-03-26 MED ORDER — ASPIRIN EC 325 MG PO TBEC
325.0000 mg | DELAYED_RELEASE_TABLET | Freq: Once | ORAL | Status: AC
  Administered 2021-03-26: 325 mg via ORAL
  Filled 2021-03-26: qty 1

## 2021-03-26 MED ORDER — ACETAMINOPHEN 650 MG RE SUPP: 650.0000 mg | RECTAL | Status: DC | PRN

## 2021-03-26 MED ORDER — MELATONIN 5 MG PO TABS
5.0000 mg | ORAL_TABLET | Freq: Every evening | ORAL | Status: DC | PRN
  Administered 2021-03-26 – 2021-03-27 (×2): 5 mg via ORAL
  Filled 2021-03-26 (×3): qty 1

## 2021-03-26 MED ORDER — DIVALPROEX SODIUM 250 MG PO DR TAB
250.0000 mg | DELAYED_RELEASE_TABLET | Freq: Every day | ORAL | Status: DC
  Administered 2021-03-26 – 2021-03-27 (×2): 250 mg via ORAL
  Filled 2021-03-26 (×2): qty 1

## 2021-03-26 MED ORDER — ATORVASTATIN CALCIUM 20 MG PO TABS
80.0000 mg | ORAL_TABLET | Freq: Every day | ORAL | Status: DC
  Administered 2021-03-26 – 2021-03-28 (×3): 80 mg via ORAL
  Filled 2021-03-26 (×3): qty 4

## 2021-03-26 MED ORDER — STROKE: EARLY STAGES OF RECOVERY BOOK: Freq: Once | Status: AC

## 2021-03-26 MED ORDER — DONEPEZIL HCL 5 MG PO TABS
5.0000 mg | ORAL_TABLET | Freq: Every day | ORAL | Status: DC
  Administered 2021-03-26 – 2021-03-27 (×2): 5 mg via ORAL
  Filled 2021-03-26 (×2): qty 1

## 2021-03-26 MED ORDER — SODIUM CHLORIDE 0.9 % IV SOLN: INTRAVENOUS | Status: DC

## 2021-03-26 MED ORDER — ASPIRIN EC 81 MG PO TBEC
81.0000 mg | DELAYED_RELEASE_TABLET | Freq: Every day | ORAL | Status: DC
  Administered 2021-03-26 – 2021-03-28 (×3): 81 mg via ORAL
  Filled 2021-03-26 (×3): qty 1

## 2021-03-26 MED ORDER — ACETAMINOPHEN 160 MG/5ML PO SOLN
650.0000 mg | ORAL | Status: DC | PRN
  Filled 2021-03-26: qty 20.3

## 2021-03-26 MED ORDER — HEPARIN SODIUM (PORCINE) 5000 UNIT/ML IJ SOLN
5000.0000 [IU] | Freq: Three times a day (TID) | INTRAMUSCULAR | Status: DC
  Administered 2021-03-26 – 2021-03-27 (×2): 5000 [IU] via SUBCUTANEOUS
  Filled 2021-03-26 (×2): qty 1

## 2021-03-26 MED ORDER — ACETAMINOPHEN 325 MG PO TABS
650.0000 mg | ORAL_TABLET | ORAL | Status: DC | PRN
  Administered 2021-03-28 (×2): 650 mg via ORAL
  Filled 2021-03-26 (×2): qty 2

## 2021-03-26 NOTE — ED Triage Notes (Signed)
Pt to ED for generalized weakness, states woke up with weakness. Family reports she has been saying her lips are "numb" for days and right leg feels "weird". Family reports AMS at baseline Pt in NAD

## 2021-03-26 NOTE — H&P (Signed)
History and Physical    Barbara Vega TKZ:[id number] DOB: 1934-01-22 DOA: 03/26/2021  PCP: [name], MD    Patient coming from:  Home    Chief Complaint:  Paresthesia    HPI: Barbara Vega is a 87 y.o. female seen in ed with complaints of paresthesia of lip and rt  Arm- elbow to wrist.  Per report patient has been having symptoms for about a week and they have been intermittent.  The symptoms resolved for about 2 days and then recurred again today morning and decided to come to the hospital today.  Her daughter Ms. Barbara at bedside stated that her symptoms occurred on Thursday and then again today morning.  In between patient was independent active functional and blowing leaves with a leaf blower just yesterday.  Patient denies any vision complaints.  Review of systems is negative    Pt has past medical history of osteoporosis, hypertension, macular degeneration, arthritis, anemia, sinus infections, Alzheimer's dementia, degenerative disc disease of the lumbar spine, GERD, sacroiliac joint dysfunction, hypokalemia.  ED Course:  Vitals:   03/26/21 1538 03/26/21 1731 03/26/21 1913 03/26/21 1914  BP: (!) 169/88 (!) 132/92  (!) 155/83  Pulse: 75 67 79   Resp: 18 18    Temp:      TempSrc:      SpO2: 92% 94% 92%   Weight:      Height:      In Ed pt is AXO x 3 , hypertensive , afebrile. Labs shows normal CMP, cbc shows normal  16.5, 534.    Review of Systems:  Review of Systems  Constitutional: Negative.   HENT: Negative.    Eyes: Negative.   Respiratory: Negative.    Cardiovascular: Negative.   Gastrointestinal: Negative.   Genitourinary: Negative.   Musculoskeletal: Negative.   Skin: Negative.   Neurological: Negative.   Endo/Heme/Allergies: Negative.     Past Medical History:  Diagnosis Date   Adenomatous colon polyp    Anemia    hx   Arthritis    Benign breast lumps    History of colon polyps    benign   Hypertension    takes Triamterene-HCTZ daily    Insomnia    takes Restoril nightly   Macular degeneration    dry   Osteoporosis    Sinus infection    taking Ceftin daily     Past Surgical History:  Procedure Laterality Date   ABDOMINAL HYSTERECTOMY     BREAST EXCISIONAL BIOPSY     BREAST LUMPECTOMY Right 09/08/2015   intraductal papilloma   BREAST LUMPECTOMY Left    2 bx done only one scar seen years ago neg   cataract surgery Bilateral    COLONOSCOPY     COLONOSCOPY WITH PROPOFOL N/A 11/03/2016   Procedure: COLONOSCOPY WITH PROPOFOL;  Surgeon: Manya Silvas, MD;  Location: [facility] ENDOSCOPY;  Service: Endoscopy;  Laterality: N/A;   ESOPHAGOGASTRODUODENOSCOPY (EGD) WITH PROPOFOL N/A 11/03/2016   Procedure: ESOPHAGOGASTRODUODENOSCOPY (EGD) WITH PROPOFOL;  Surgeon: Manya Silvas, MD;  Location: [facility] ENDOSCOPY;  Service: Endoscopy;  Laterality: N/A;   INGUINAL HERNIA REPAIR Left 01/15/2017   Procedure: HERNIA REPAIR INGUINAL ADULT;  Surgeon: Florene Glen, MD;  Location: [facility] ORS;  Service: General;  Laterality: Left;   KYPHOPLASTY N/A 05/15/2019   Procedure: KYPHOPLASTY T12;  Surgeon: Hessie Knows, MD;  Location: [facility] ORS;  Service: Orthopedics;  Laterality: N/A;   KYPHOPLASTY N/A 06/01/2019   Procedure: T11 KYPHOPLASTY;  Surgeon: Hessie Knows, MD;  Location: [facility] ORS;  Service: Orthopedics;  Laterality: N/A;   RADIOACTIVE SEED GUIDED EXCISIONAL BREAST BIOPSY Right 09/08/2015   Procedure: RADIOACTIVE SEED GUIDED EXCISIONAL BREAST BIOPSY;  Surgeon: Rolm Bookbinder, MD;  Location: [facility];  Service: General;  Laterality: Right;     reports that she has never smoked. She has never used smokeless tobacco. She reports that she does not drink alcohol and does not use drugs.  Allergies  Allergen Reactions   Codone [Hydrocodone] Nausea And Vomiting    Family History  Problem Relation Age of Onset   Osteoporosis Mother    Heart failure Mother    Heart block Brother    Alcohol abuse Brother    Bladder Cancer Neg Hx     Kidney cancer Neg Hx     Prior to Admission medications   Medication Sig Start Date End Date Taking? Authorizing Provider  amLODipine (NORVASC) 2.5 MG tablet Take 2.5 mg by mouth daily.    [provider]  divalproex (DEPAKOTE) 250 MG DR tablet Take 250 mg by mouth daily.    [provider]  donepezil (ARICEPT) 5 MG tablet Take 5 mg by mouth at bedtime.    [provider]  Multiple Vitamins-Minerals (PRESERVISION AREDS 2+MULTI VIT) CAPS Take 1 capsule by mouth at bedtime.    [provider]  pantoprazole (PROTONIX) 40 MG tablet Take 1 tablet (40 mg total) by mouth daily. 10/28/20 11/27/20  Loletha Grayer, MD  potassium chloride SA (KLOR-CON) 20 MEQ tablet Take 1 tablet (20 mEq total) by mouth daily for 5 days. 10/29/20 11/03/20  Loletha Grayer, MD  temazepam (RESTORIL) 30 MG capsule Take 30 mg by mouth at bedtime.  09/13/14   [provider]    Physical Exam: Vitals:   03/26/21 1538 03/26/21 1731 03/26/21 1913 03/26/21 1914  BP: (!) 169/88 (!) 132/92  (!) 155/83  Pulse: 75 67 79   Resp: 18 18    Temp:      TempSrc:      SpO2: 92% 94% 92%   Weight:      Height:       Physical Exam Vitals and nursing note reviewed.  Constitutional:      General: She is not in acute distress.    Appearance: Normal appearance. She is not ill-appearing, toxic-appearing or diaphoretic.  HENT:     Head: Normocephalic and atraumatic.     Right Ear: External ear normal.     Left Ear: External ear normal.     Nose: Nose normal.     Mouth/Throat:     Mouth: Mucous membranes are moist.  Eyes:     Extraocular Movements: Extraocular movements intact.     Pupils: Pupils are equal, round, and reactive to light.  Neck:     Vascular: No carotid bruit.  Cardiovascular:     Rate and Rhythm: Normal rate and regular rhythm.     Pulses: Normal pulses.     Heart sounds: Normal heart sounds.  Pulmonary:     Effort: Pulmonary effort is normal.     Breath sounds:  Normal breath sounds.  Abdominal:     General: Bowel sounds are normal. There is no distension.     Palpations: Abdomen is soft. There is no mass.     Tenderness: There is no abdominal tenderness. There is no guarding.     Hernia: No hernia is present.  Musculoskeletal:        General: No swelling or deformity.  Skin:  General: Skin is warm.  Neurological:     General: No focal deficit present.     Mental Status: She is alert and oriented to person, place, and time.     Cranial Nerves: Cranial nerves 2-12 are intact. No cranial nerve deficit, dysarthria or facial asymmetry.     Motor: No weakness, tremor or abnormal muscle tone.     Coordination: Coordination normal. Finger-Nose-Finger Test normal.     Deep Tendon Reflexes: Reflexes normal.     Reflex Scores:      Bicep reflexes are 1+ on the right side and 1+ on the left side.      Patellar reflexes are 1+ on the right side and 1+ on the left side. Psychiatric:        Mood and Affect: Mood normal.        Behavior: Behavior normal.     Labs on Admission: I have personally reviewed following labs and imaging studies  No results for input(s): CKTOTAL, CKMB, TROPONINI in the last 72 hours. Lab Results  Component Value Date   WBC 6.6 03/26/2021   HGB 16.5 (H) 03/26/2021   HCT 48.9 (H) 03/26/2021   MCV 89.9 03/26/2021   PLT 534 (H) 03/26/2021    Recent Labs  Lab 03/26/21 1226  NA 135  K 3.6  CL 102  CO2 25  BUN 13  CREATININE 0.70  CALCIUM 9.2  PROT 7.0  BILITOT 0.8  ALKPHOS 57  ALT 8  AST 19  GLUCOSE 116*   No results found for: CHOL, HDL, LDLCALC, TRIG No results found for: DDIMER Invalid input(s): POCBNP  Urinalysis    Component Value Date/Time   COLORURINE STRAW (A) 10/26/2020 0620   APPEARANCEUR CLEAR (A) 10/26/2020 0620   APPEARANCEUR Cloudy (A) 12/20/2016 1304   LABSPEC 1.015 10/26/2020 0620   PHURINE 8.0 10/26/2020 0620   GLUCOSEU NEGATIVE 10/26/2020 0620   HGBUR NEGATIVE 10/26/2020 0620    BILIRUBINUR NEGATIVE 10/26/2020 0620   BILIRUBINUR Negative 12/20/2016 1304   KETONESUR 5 (A) 10/26/2020 0620   PROTEINUR NEGATIVE 10/26/2020 0620   NITRITE NEGATIVE 10/26/2020 0620   LEUKOCYTESUR NEGATIVE 10/26/2020 0620   COVID-19 Labs No results for input(s): DDIMER, FERRITIN, LDH, CRP in the last 72 hours. Lab Results  Component Value Date   SARSCOV2NAA NEGATIVE 10/26/2020   SARSCOV2NAA NEGATIVE 10/14/2020   [place] NEGATIVE 06/14/2019   [place] NEGATIVE 05/30/2019    Radiological Exams on Admission: CT HEAD WO CONTRAST  Result Date: 03/26/2021 CLINICAL DATA:  Generalized weakness EXAM: CT HEAD WITHOUT CONTRAST TECHNIQUE: Contiguous axial images were obtained from the base of the skull through the vertex without intravenous contrast. COMPARISON:  10/26/2020 FINDINGS: Brain: No evidence of acute infarction, hemorrhage, hydrocephalus, extra-axial collection or mass lesion/mass effect. Moderate low-density changes within the periventricular and subcortical white matter compatible with chronic microvascular ischemic change. Mild diffuse cerebral volume loss. Vascular: Atherosclerotic calcifications involving the large vessels of the skull base. No unexpected hyperdense vessel. Skull: Normal. Negative for fracture or focal lesion. Sinuses/Orbits: No acute finding. Other: None. IMPRESSION: 1. No acute intracranial findings. 2. Chronic microvascular ischemic change and cerebral volume loss. Electronically Signed   By: Davina Poke D.O.   On: 03/26/2021 12:44   MR BRAIN WO CONTRAST  Result Date: 03/26/2021 EXAM: MRI HEAD WITHOUT CONTRAST TECHNIQUE: Multiplanar, multiecho pulse sequences of the brain and surrounding structures were obtained without intravenous contrast. COMPARISON:  MRI August 20, 2019. FINDINGS: Brain: Acute left thalamocapsular infarcts. Mild edema without mass effect. Moderate  scattered T2 hyperintensities in the white matter, nonspecific but compatible with chronic  microvascular ischemic disease. No hydrocephalus, mass lesion, midline shift, or extra-axial fluid collection. No acute hemorrhage. Small foci of susceptibility artifact within bilateral parietal temporal and left occipital regions, likely chronic microhemorrhages. High-resolution T2 imaging was performed through the internal auditory canals. Unremarkable appearance of the seventh and eighth cranial nerves bilaterally. No obvious retrocochlear mass on this noncontrast study. Normal CSF signal within bilateral labyrinth. Vascular: Major arterial flow voids are maintained at the skull base. Skull and upper cervical spine: Normal marrow signal. Sinuses/Orbits: Clear sinuses.  Unremarkable orbits. Other: Trace right mastoid fluid IMPRESSION: 1. Acute left thalamocapsular infarcts. Mild edema without mass effect. 2. Moderate chronic microvascular disease. Electronically Signed   By: Margaretha Sheffield M.D.   On: 03/26/2021 17:34    EKG: Independently reviewed.  NSR at 79 with no st changes.    Assessment/Plan Principal Problem:   Paresthesia Active Problems:   Acute CVA (cerebrovascular accident) [facility])   Essential hypertension   GERD (gastroesophageal reflux disease) Patient is a independent active alert awake oriented nonfocal 87 year old Caucasian female presenting with symptoms, numbness from her right wrist to her right elbow and numbness of her lips since Thursday that resolved and recurred today in the morning.  We will admit patient in observation status for evaluation with MRI/echocardiogram and stroke protocol. Paresthesia/acute CVA:  Admission imaging with CT head noncontrast was negative for any acute process.  MRI of the brain that followed showed an acute left thalamocapsular infarct and mild edema without mass-effect.  Patient was started on aspirin full dose along with statin therapy after a bedside swallow in the emergency room the goals of permissive hypertension over the next 24 to 48  hours.  Patient's exam is nonfocal she is not weak get physical therapy for evaluation and recommendation.  I suspect patient can be discharged tomorrow with home physical therapy. Aspiration, fall precautions. Aspirin 81, Lipitor 80 with a.m. lipid panels and thyroid function studies. Comprehensive metabolic panel, CBC with differential, sed rate.  Hypertension: Patient takes low-dose amlodipine at home and discussed with daughter for the next 24 hours our goal is permissive hypertension to perfuse the brain. Heart healthy diet.   GERD: Pepcid p.o. twice daily.    DVT prophylaxis:  scd's  Code Status:  Full code     Family Communication:  McPherson,Barbara T (Daughter)  [phone] (Mobile)   Disposition Plan:  Home    Consults called:  None   Admission status: Observation.     Para Skeans MD [facility] [phone] How to contact the [facility] Attending or Consulting provider [place] or covering provider during after hours [place], for this patient.    Check the care team in [facility] and look for a) attending/consulting [facility] provider listed and b) the [facility] team listed Log into [email or web address] and use [place]'s universal password to access. If you do not have the password, please contact the hospital operator. Locate the [facility] provider you are looking for under [facility] and page to a number that you can be directly reached. If you still have difficulty reaching the provider, please page the [facility] (Director on Call) for the Hospitalists listed on amion for assistance. [email or web address] Password [facility] 03/26/2021, 8:33 PM

## 2021-03-26 NOTE — ED Notes (Signed)
Pt to CT. Pt c/o intermittent numbness on R lips and R arm ongoing since a few days ago.  Daughter at bedside. She states pt has moderate stage dementia and often has vague complaints. Will perform NIH when pt returns. NP has been to bedside.

## 2021-03-26 NOTE — ED Provider Notes (Signed)
[facility] Emergency Department Provider Note ____________________________________________   Event Date/Time   First MD Initiated Contact with Patient 03/26/21 1212     (approximate)  I have reviewed the triage vital signs and the nursing notes.   HISTORY  Chief Complaint Weakness  HPI Barbara Vega is a 87 y.o. female with history of Alzheimer's, agitation, hypertension, and remaining history as listed below presents to the emergency department for treatment and evaluation of numbness to the right arm and lips.  Daughter at bedside states that this has been intermittent over the past few days.  She states that patient did not complain of any numbness for the previous 2 days, but again started to this morning.  Daughter has not noticed any change in cognition such as slurred speech, level of dementia or facial droop. Depakote was recently increased for agitation.       Past Medical History:  Diagnosis Date   Adenomatous colon polyp    Anemia    hx   Arthritis    Benign breast lumps    History of colon polyps    benign   Hypertension    takes Triamterene-HCTZ daily   Insomnia    takes Restoril nightly   Macular degeneration    dry   Osteoporosis    Sinus infection    taking Ceftin daily     Patient Active Problem List   Diagnosis Date Noted   Acute CVA (cerebrovascular accident) ([place]) 03/26/2021   Paresthesia 03/26/2021   Alzheimer's dementia without behavioral disturbance ([place])    Hiatal hernia with GERD 10/26/2020   Generalized weakness 06/15/2019   Pain due to onychomycosis of toenails of both feet 06/07/2019   S/P kyphoplasty 06/01/2019   DDD (degenerative disc disease), lumbar 06/16/2017   SI (sacroiliac) joint dysfunction 06/16/2017   Incarcerated left inguinal hernia    GERD (gastroesophageal reflux disease) 09/20/2016   Dysphagia, unspecified 08/09/2016   Papilloma of breast 04/03/2015   Osteoporosis, post-menopausal 03/04/2015    Cannot sleep 03/04/2015   Essential hypertension 03/04/2015   Adenomatous colon polyp 03/04/2015    Past Surgical History:  Procedure Laterality Date   ABDOMINAL HYSTERECTOMY     BREAST EXCISIONAL BIOPSY     BREAST LUMPECTOMY Right 09/08/2015   intraductal papilloma   BREAST LUMPECTOMY Left    2 bx done only one scar seen years ago neg   cataract surgery Bilateral    COLONOSCOPY     COLONOSCOPY WITH PROPOFOL N/A 11/03/2016   Procedure: COLONOSCOPY WITH PROPOFOL;  Surgeon: Manya Silvas, MD;  Location: [facility] ENDOSCOPY;  Service: Endoscopy;  Laterality: N/A;   ESOPHAGOGASTRODUODENOSCOPY (EGD) WITH PROPOFOL N/A 11/03/2016   Procedure: ESOPHAGOGASTRODUODENOSCOPY (EGD) WITH PROPOFOL;  Surgeon: Manya Silvas, MD;  Location: [facility] ENDOSCOPY;  Service: Endoscopy;  Laterality: N/A;   INGUINAL HERNIA REPAIR Left 01/15/2017   Procedure: HERNIA REPAIR INGUINAL ADULT;  Surgeon: Florene Glen, MD;  Location: [facility] ORS;  Service: General;  Laterality: Left;   KYPHOPLASTY N/A 05/15/2019   Procedure: KYPHOPLASTY T12;  Surgeon: Hessie Knows, MD;  Location: [facility] ORS;  Service: Orthopedics;  Laterality: N/A;   KYPHOPLASTY N/A 06/01/2019   Procedure: T11 KYPHOPLASTY;  Surgeon: Hessie Knows, MD;  Location: [facility] ORS;  Service: Orthopedics;  Laterality: N/A;   RADIOACTIVE SEED GUIDED EXCISIONAL BREAST BIOPSY Right 09/08/2015   Procedure: RADIOACTIVE SEED GUIDED EXCISIONAL BREAST BIOPSY;  Surgeon: Rolm Bookbinder, MD;  Location: [place];  Service: General;  Laterality: Right;    Prior to  Admission medications   Medication Sig Start Date End Date Taking? Authorizing Provider  amLODipine (NORVASC) 2.5 MG tablet Take 2.5 mg by mouth daily.    [provider]  divalproex (DEPAKOTE) 250 MG DR tablet Take 250 mg by mouth daily.    [provider]  donepezil (ARICEPT) 5 MG tablet Take 5 mg by mouth at bedtime.    [provider]  Multiple Vitamins-Minerals (PRESERVISION AREDS  2+MULTI VIT) CAPS Take 1 capsule by mouth at bedtime.    [provider]  pantoprazole (PROTONIX) 40 MG tablet Take 1 tablet (40 mg total) by mouth daily. 10/28/20 11/27/20  Loletha Grayer, MD  potassium chloride SA (KLOR-CON) 20 MEQ tablet Take 1 tablet (20 mEq total) by mouth daily for 5 days. 10/29/20 11/03/20  Loletha Grayer, MD    Allergies Codone [hydrocodone]  Family History  Problem Relation Age of Onset   Osteoporosis Mother    Heart failure Mother    Heart block Brother    Alcohol abuse Brother    Bladder Cancer Neg Hx    Kidney cancer Neg Hx     Social History Social History   Tobacco Use   Smoking status: Never   Smokeless tobacco: Never  Vaping Use   Vaping Use: Never used  Substance Use Topics   Alcohol use: No    Alcohol/week: 0.0 standard drinks   Drug use: No    Review of Systems  Constitutional: No fever/chills Eyes: No visual changes. ENT: No sore throat. Cardiovascular: Denies chest pain. Respiratory: Denies shortness of breath. Gastrointestinal: No abdominal pain.  No nausea, no vomiting.  No diarrhea.  No constipation. Genitourinary: Negative for dysuria. Musculoskeletal: Negative for back pain. Skin: Negative for rash. Neurological: Negative for headaches, focal weakness or numbness. Positive for right arm numbness and right upper lip numbness  ____________________________________________   PHYSICAL EXAM:  VITAL SIGNS: ED Triage Vitals [03/26/21 1207]  Enc Vitals Group     BP (!) 159/70     Pulse Rate 77     Resp 18     Temp 98 F (36.7 C)     Temp Source Oral     SpO2 95 %     Weight 135 lb (61.2 kg)     Height 5' (1.524 m)     Head Circumference      Peak Flow      Pain Score 0     Pain Loc      Pain Edu?      Excl. in [place]?     Constitutional: Alert and oriented. Well appearing and in no acute distress. Eyes: Conjunctivae are normal. PERRL. EOMI. Head: Atraumatic. Nose: No congestion/rhinnorhea. Mouth/Throat:  Mucous membranes are moist.  Oropharynx non-erythematous. Neck: No stridor.   Hematological/Lymphatic/Immunilogical: No cervical lymphadenopathy. Cardiovascular: Normal rate, regular rhythm. Grossly normal heart sounds.  Good peripheral circulation. Respiratory: Normal respiratory effort.  No retractions. Lungs CTAB. Gastrointestinal: Soft and nontender. No distention. No abdominal bruits. No CVA tenderness. Genitourinary:  Musculoskeletal: No lower extremity tenderness nor edema.  No joint effusions. Neurologic:  Normal speech and language. No gross focal neurologic deficits are appreciated. No gait instability. Skin:  Skin is warm, dry and intact. No rash noted. Psychiatric: Mood and affect are normal. Speech and behavior are normal.  ____________________________________________   LABS (all labs ordered are listed, but only abnormal results are displayed)  Labs Reviewed  CBC - Abnormal; Notable for the following components:      Result Value  RBC 5.44 (*)    Hemoglobin 16.5 (*)    HCT 48.9 (*)    Platelets 534 (*)    All other components within normal limits  COMPREHENSIVE METABOLIC PANEL - Abnormal; Notable for the following components:   Glucose, Bld 116 (*)    All other components within normal limits  RESP PANEL BY RT-PCR (FLU A&B, COVID) ARPGX2  PROTIME-INR  APTT  DIFFERENTIAL  VALPROIC ACID LEVEL  HEMOGLOBIN A1C  LIPID PANEL   ____________________________________________  EKG  ED ECG REPORT I, Chadley Dziedzic, FNP-BC personally viewed and interpreted this ECG.   Date: 03/26/2021  EKG Time: 1214  Rate: 79  Rhythm: normal sinus rhythm  Axis: rightward  Intervals:none  ST&T Change: no ST elevation or new LBBB  ____________________________________________  RADIOLOGY  ED MD interpretation:    CT of the head negative for acute concerns per radiology.  MRI of the brain without contrast shows acute left thalamocapsular infarcts with mild edema but no  mass-effect.  I, Sherrie George, personally viewed and evaluated these images (plain radiographs) as part of my medical decision making, as well as reviewing the written report by the radiologist.  Official radiology report(s): CT HEAD WO CONTRAST  Result Date: 03/26/2021 CLINICAL DATA:  Generalized weakness EXAM: CT HEAD WITHOUT CONTRAST TECHNIQUE: Contiguous axial images were obtained from the base of the skull through the vertex without intravenous contrast. COMPARISON:  10/26/2020 FINDINGS: Brain: No evidence of acute infarction, hemorrhage, hydrocephalus, extra-axial collection or mass lesion/mass effect. Moderate low-density changes within the periventricular and subcortical white matter compatible with chronic microvascular ischemic change. Mild diffuse cerebral volume loss. Vascular: Atherosclerotic calcifications involving the large vessels of the skull base. No unexpected hyperdense vessel. Skull: Normal. Negative for fracture or focal lesion. Sinuses/Orbits: No acute finding. Other: None. IMPRESSION: 1. No acute intracranial findings. 2. Chronic microvascular ischemic change and cerebral volume loss. Electronically Signed   By: Davina Poke D.O.   On: 03/26/2021 12:44   MR BRAIN WO CONTRAST  Result Date: 03/26/2021 EXAM: MRI HEAD WITHOUT CONTRAST TECHNIQUE: Multiplanar, multiecho pulse sequences of the brain and surrounding structures were obtained without intravenous contrast. COMPARISON:  MRI August 20, 2019. FINDINGS: Brain: Acute left thalamocapsular infarcts. Mild edema without mass effect. Moderate scattered T2 hyperintensities in the white matter, nonspecific but compatible with chronic microvascular ischemic disease. No hydrocephalus, mass lesion, midline shift, or extra-axial fluid collection. No acute hemorrhage. Small foci of susceptibility artifact within bilateral parietal temporal and left occipital regions, likely chronic microhemorrhages. High-resolution T2 imaging was  performed through the internal auditory canals. Unremarkable appearance of the seventh and eighth cranial nerves bilaterally. No obvious retrocochlear mass on this noncontrast study. Normal CSF signal within bilateral labyrinth. Vascular: Major arterial flow voids are maintained at the skull base. Skull and upper cervical spine: Normal marrow signal. Sinuses/Orbits: Clear sinuses.  Unremarkable orbits. Other: Trace right mastoid fluid IMPRESSION: 1. Acute left thalamocapsular infarcts. Mild edema without mass effect. 2. Moderate chronic microvascular disease. Electronically Signed   By: Margaretha Sheffield M.D.   On: 03/26/2021 17:34    ____________________________________________   PROCEDURES  Procedure(s) performed (including Critical Care):  Procedures  ____________________________________________   INITIAL IMPRESSION / ASSESSMENT AND PLAN     87 year old female presenting to the emergency department for treatment and evaluation of sensation of numbness from the elbow to the wrist on the right side and.  Perioral numbness.  The daughter states that this has been intermittent over the past few days, but she did not  have any issues yesterday or the day before.  The area of numbness in the lips changes from day-to-day.  Daughter states that she has also had some increased agitation and has been hyper focused on making sure that the leaves and sticks are picked up outside.  This is not new, however daughter feels that this has been very consuming of her time and efforts. Neuro exam is without focal weakness or slurred speech.  DIFFERENTIAL DIAGNOSIS  CVA; TIA; Dementia; B12 deficiency; thyroid disorder  ED COURSE  Head CT and labs are reassuring. Discussed findings with patient and daughter. On reassessment, patient states that her arm isn't numb, but her lips are still "tingly, funny feeling." Will order MR Brain.   MR results discussed with patient and daughter. Plan will be to admit for  further evaluation and medical management.   Patient accepted for admission by Dr. Posey Pronto. Requested RN to perform swallow screen and give ASA if normal.     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 She was evaluated in the context of the global COVID-19 pandemic, which necessitated consideration that the patient might be at risk for infection with the SARS-CoV-2 virus that causes COVID-19. Institutional protocols and algorithms that pertain to the evaluation of patients at risk for COVID-19 are in a state of rapid change based on information released by regulatory bodies including the CDC and federal and state organizations. These policies and algorithms were followed during the patient's care in the ED.   Note:  This document was prepared using Dragon voice recognition software and may include unintentional dictation errors.    Victorino Dike, FNP 03/26/21 1951    Nance Pear, MD 03/26/21 2026

## 2021-03-27 ENCOUNTER — Inpatient Hospital Stay
Admit: 2021-03-27 | Discharge: 2021-03-27 | Disposition: A | Discharge status: Home / Self Care | Payer: Medicare Other | Attending: Internal Medicine | Admitting: Internal Medicine

## 2021-03-27 ENCOUNTER — Inpatient Hospital Stay: Payer: Medicare Other

## 2021-03-27 DIAGNOSIS — I639 Cerebral infarction, unspecified: Secondary | ICD-10-CM

## 2021-03-27 LAB — CBC
HCT: 44.1 % (ref 36.0–46.0)
Hemoglobin: 14.7 g/dL (ref 12.0–15.0)
MCH: 30.2 pg (ref 26.0–34.0)
MCHC: 33.3 g/dL (ref 30.0–36.0)
MCV: 90.7 fL (ref 80.0–100.0)
Platelets: 470 10*3/uL — ABNORMAL HIGH (ref 150–400)
RBC: 4.86 MIL/uL (ref 3.87–5.11)
RDW: 11.8 % (ref 11.5–15.5)
WBC: 6.6 10*3/uL (ref 4.0–10.5)
nRBC: 0 % (ref 0.0–0.2)

## 2021-03-27 LAB — LIPID PANEL
Cholesterol: 150 mg/dL (ref 0–200)
HDL: 69 mg/dL (ref >40)
LDL Cholesterol: 73 mg/dL (ref 0–99)
Total CHOL/HDL Ratio: 2.2 RATIO
Triglycerides: 41 mg/dL (ref <150)
VLDL: 8 mg/dL (ref 0–40)

## 2021-03-27 LAB — ECHOCARDIOGRAM COMPLETE
AR max vel: 2.35 cm2
AV Area VTI: 2.8 cm2
AV Area mean vel: 2.27 cm2
AV Mean grad: 4 mmHg
AV Peak grad: 6.1 mmHg
Ao pk vel: 1.23 m/s
Area-P 1/2: 2.9 cm2
Height: 62 in
MV VTI: 2.72 cm2
S' Lateral: 2.7 cm
Weight: 2063.51 oz

## 2021-03-27 LAB — HEMOGLOBIN A1C
Hgb A1c MFr Bld: 5 % (ref 4.8–5.6)
Mean Plasma Glucose: 96.8 mg/dL

## 2021-03-27 MED ORDER — HYDRALAZINE HCL 20 MG/ML IJ SOLN: 10.0000 mg | INTRAMUSCULAR | Status: DC | PRN

## 2021-03-27 MED ORDER — DIVALPROEX SODIUM 250 MG PO DR TAB
250.0000 mg | DELAYED_RELEASE_TABLET | Freq: Every day | ORAL | Status: DC
  Administered 2021-03-27: 22:00:00 250 mg via ORAL
  Filled 2021-03-27 (×2): qty 1

## 2021-03-27 MED ORDER — ENOXAPARIN SODIUM 40 MG/0.4ML IJ SOSY
40.0000 mg | PREFILLED_SYRINGE | INTRAMUSCULAR | Status: DC
  Administered 2021-03-27: 22:00:00 40 mg via SUBCUTANEOUS
  Filled 2021-03-27: qty 0.4

## 2021-03-27 MED ORDER — DIVALPROEX SODIUM 250 MG PO DR TAB
375.0000 mg | DELAYED_RELEASE_TABLET | Freq: Every day | ORAL | Status: DC
  Administered 2021-03-28: 375 mg via ORAL
  Filled 2021-03-27: qty 1

## 2021-03-27 MED ORDER — CLOPIDOGREL BISULFATE 75 MG PO TABS
75.0000 mg | ORAL_TABLET | Freq: Every day | ORAL | Status: DC
  Administered 2021-03-27 – 2021-03-28 (×2): 75 mg via ORAL
  Filled 2021-03-27 (×2): qty 1

## 2021-03-27 NOTE — Consult Note (Signed)
NEUROLOGY CONSULTATION NOTE   Date of service: March 27, 2021 Patient Name: Barbara Vega MRN:  [id number] DOB:  1934-05-10 Reason for consult: stroke Requesting physician: Dr. Nolberto Hanlon _ _ _   _ __   _ __ _ _  __ __   _ __   __ _  History of Present Illness   This is a 87 year old woman with a past medical history significant for hypertension and macular degeneration admitted to the emergency department yesterday with complaints of perioral paresthesias and numbness in the right arm.  Symptoms have been present for a week and have been intermittent.  Symptoms did completely resolve for about 2 days and then recurred again yesterday morning therefore she decided to him to the hospital.  She is very functional and independent at baseline.  MRI brain was notable for a left thalamocapsular ischemic infarct.  Carotid ultrasound did not show any hemodynamically significant stenoses.  TTE has been ordered and is pending.  Patient tells me today that her sx are resolved and that she has no current deficits.  Stroke Labs     Component Value Date/Time   CHOL 150 03/27/2021 0620   TRIG 41 03/27/2021 0620   HDL 69 03/27/2021 0620   CHOLHDL 2.2 03/27/2021 0620   VLDL 8 03/27/2021 0620   LDLCALC 73 03/27/2021 0620    Lab Results  Component Value Date/Time   HGBA1C 5.0 03/27/2021 06:20 AM      ROS   Per HPI: all other systems reviewed and are negative  Past History   I have reviewed the following:  Past Medical History:  Diagnosis Date  . Adenomatous colon polyp   . Anemia    hx  . Arthritis   . Benign breast lumps   . History of colon polyps    benign  . Hypertension    takes Triamterene-HCTZ daily  . Insomnia    takes Restoril nightly  . Macular degeneration    dry  . Osteoporosis   . Sinus infection    taking Ceftin daily    Past Surgical History:  Procedure Laterality Date  . ABDOMINAL HYSTERECTOMY    . BREAST EXCISIONAL BIOPSY    . BREAST LUMPECTOMY Right  09/08/2015   intraductal papilloma  . BREAST LUMPECTOMY Left    2 bx done only one scar seen years ago neg  . cataract surgery Bilateral   . COLONOSCOPY    . COLONOSCOPY WITH PROPOFOL N/A 11/03/2016   Procedure: COLONOSCOPY WITH PROPOFOL;  Surgeon: Manya Silvas, MD;  Location: [facility] ENDOSCOPY;  Service: Endoscopy;  Laterality: N/A;  . ESOPHAGOGASTRODUODENOSCOPY (EGD) WITH PROPOFOL N/A 11/03/2016   Procedure: ESOPHAGOGASTRODUODENOSCOPY (EGD) WITH PROPOFOL;  Surgeon: Manya Silvas, MD;  Location: [facility] ENDOSCOPY;  Service: Endoscopy;  Laterality: N/A;  . INGUINAL HERNIA REPAIR Left 01/15/2017   Procedure: HERNIA REPAIR INGUINAL ADULT;  Surgeon: Florene Glen, MD;  Location: [facility] ORS;  Service: General;  Laterality: Left;  . KYPHOPLASTY N/A 05/15/2019   Procedure: KYPHOPLASTY T12;  Surgeon: Hessie Knows, MD;  Location: [facility] ORS;  Service: Orthopedics;  Laterality: N/A;  . KYPHOPLASTY N/A 06/01/2019   Procedure: T11 KYPHOPLASTY;  Surgeon: Hessie Knows, MD;  Location: [facility] ORS;  Service: Orthopedics;  Laterality: N/A;  . RADIOACTIVE SEED GUIDED EXCISIONAL BREAST BIOPSY Right 09/08/2015   Procedure: RADIOACTIVE SEED GUIDED EXCISIONAL BREAST BIOPSY;  Surgeon: Rolm Bookbinder, MD;  Location: [place];  Service: General;  Laterality: Right;   Family History  Problem Relation Age of  Onset  . Osteoporosis Mother   . Heart failure Mother   . Heart block Brother   . Alcohol abuse Brother   . Bladder Cancer Neg Hx   . Kidney cancer Neg Hx    Social History   Socioeconomic History  . Marital status: Divorced    Spouse name: Not on file  . Number of children: Not on file  . Years of education: Not on file  . Highest education level: Not on file  Occupational History  . Not on file  Tobacco Use  . Smoking status: Never  . Smokeless tobacco: Never  Vaping Use  . Vaping Use: Never used  Substance and Sexual Activity  . Alcohol use: No    Alcohol/week: 0.0 standard drinks  . Drug use:  No  . Sexual activity: Not on file  Other Topics Concern  . Not on file  Social History Narrative  . Not on file   Social Determinants of Health   Financial Resource Strain: Not on file  Food Insecurity: Not on file  Transportation Needs: Not on file  Physical Activity: Not on file  Stress: Not on file  Social Connections: Not on file   Allergies  Allergen Reactions  . Codone [Hydrocodone] Nausea And Vomiting    Medications   Medications Prior to Admission  Medication Sig Dispense Refill Last Dose  . amLODipine (NORVASC) 2.5 MG tablet Take 2.5 mg by mouth daily.   03/26/2021  . divalproex (DEPAKOTE) 125 MG DR tablet Take 250-375 mg by mouth 2 (two) times daily.   03/26/2021 at 2000  . donepezil (ARICEPT) 5 MG tablet Take 5 mg by mouth at bedtime.   03/25/2021 at 2000  . Multiple Vitamins-Minerals (PRESERVISION AREDS 2+MULTI VIT) CAPS Take 1 capsule by mouth at bedtime. (Patient not taking: No sig reported)   Not Taking  . pantoprazole (PROTONIX) 40 MG tablet Take 1 tablet (40 mg total) by mouth daily. 30 tablet 0   . potassium chloride SA (KLOR-CON) 20 MEQ tablet Take 1 tablet (20 mEq total) by mouth daily for 5 days. 5 tablet 0       Current Facility-Administered Medications:  .  0.9 %  sodium chloride infusion, , Intravenous, Continuous, Para Skeans, MD, Last Rate: 100 mL/hr at 03/27/21 0547, Infusion Verify at 03/27/21 0547 .  acetaminophen (TYLENOL) tablet 650 mg, 650 mg, Oral, Q4H PRN **OR** acetaminophen (TYLENOL) 160 MG/5ML solution 650 mg, 650 mg, Per Tube, Q4H PRN **OR** acetaminophen (TYLENOL) suppository 650 mg, 650 mg, Rectal, Q4H PRN, Florina Ou V, MD .  aspirin EC tablet 81 mg, 81 mg, Oral, Daily, Florina Ou V, MD, 81 mg at 03/27/21 1008 .  atorvastatin (LIPITOR) tablet 80 mg, 80 mg, Oral, Daily, Florina Ou V, MD, 80 mg at 03/27/21 1008 .  [START ON 03/28/2021] divalproex (DEPAKOTE) DR tablet 375 mg, 375 mg, Oral, Daily **AND** divalproex (DEPAKOTE) DR tablet  250 mg, 250 mg, Oral, QHS, Amery, Sahar, MD .  donepezil (ARICEPT) tablet 5 mg, 5 mg, Oral, QHS, Para Skeans, MD, 5 mg at 03/26/21 2135 .  enoxaparin (LOVENOX) injection 40 mg, 40 mg, Subcutaneous, Q24H, Amery, Sahar, MD .  melatonin tablet 5 mg, 5 mg, Oral, QHS PRN, Florina Ou V, MD, 5 mg at 03/26/21 2222  Vitals   Vitals:   03/27/21 0400 03/27/21 0600 03/27/21 0755 03/27/21 1152  BP: (!) 174/62 131/60 (!) 143/55 105/82  Pulse:   66 65  Resp: (!) 23 19 18  16  Temp:   97.9 F (36.6 C) 98.7 F (37.1 C)  TempSrc:   Oral Oral  SpO2: 95% 96% 97% 98%  Weight:      Height:         Body mass index is 23.59 kg/m.  Physical Exam   Physical Exam Gen: A&O x4, NAD HEENT: Atraumatic, normocephalic;mucous membranes moist; oropharynx clear, tongue without atrophy or fasciculations. Neck: Supple, trachea midline. Resp: CTAB, no w/r/r CV: RRR, no m/g/r; nml S1 and S2. 2+ symmetric peripheral pulses. Abd: soft/NT/ND; nabs x 4 quad Extrem: Nml bulk; no cyanosis, clubbing, or edema.  Neuro: *MS: A&O x4. Follows multi-step commands.  *Speech: fluid, nondysarthric, able to name and repeat *CN:    I: Deferred   II,III: PERRLA, VFF by confrontation, optic discs unable to be visualized 2/2 pupillary constriction   III,IV,VI: EOMI w/o nystagmus, no ptosis   V: Sensation intact from V1 to V3 to LT   VII: Eyelid closure was full.  Smile symmetric.   VIII: Significant hearing impairment bilat to voice   IX,X: Voice normal, palate elevates symmetrically    XI: SCM/trap 5/5 bilat   XII: Tongue protrudes midline, no atrophy or fasciculations   *Motor:   Normal bulk.  No tremor, rigidity or bradykinesia. No pronator drift. BUE 5/5 throughout, BLE 4/5 diffusely somewhat effort dependent. Drift but not to bed BLE *Sensory: Intact to light touch, pinprick, temperature vibration throughout. Symmetric. Propioception intact bilat.  No double-simultaneous extinction.  *Coordination:  Finger-to-nose,  heel-to-shin, rapid alternating motions were intact. *Reflexes:  2+ and symmetric throughout without clonus; toes down-going bilat *Gait: normal base, normal stride, normal turn. Negative Romberg.  NIHSS = 2 for BLE drift   Premorbid mRS = 1   Labs   CBC:  Recent Labs  Lab 03/26/21 1226 03/27/21 0620  WBC 6.6 6.6  NEUTROABS 4.0  --   HGB 16.5* 14.7  HCT 48.9* 44.1  MCV 89.9 90.7  PLT 534* 470*    Basic Metabolic Panel:  Lab Results  Component Value Date   NA 133 (L) 03/26/2021   K 3.8 03/26/2021   CO2 27 03/26/2021   GLUCOSE 126 (H) 03/26/2021   BUN 10 03/26/2021   CREATININE 0.55 03/26/2021   CALCIUM 8.9 03/26/2021   GFRNONAA >60 03/26/2021   GFRAA >60 08/31/2019   Lipid Panel:  Lab Results  Component Value Date   LDLCALC 73 03/27/2021   HgbA1c:  Lab Results  Component Value Date   HGBA1C 5.0 03/27/2021   Urine Drug Screen: No results found for: LABOPIA, COCAINSCRNUR, LABBENZ, AMPHETMU, THCU, LABBARB  Alcohol Level No results found for: ETH   Impression   This is a 87 year old woman with a past medical history significant for hypertension and macular degeneration admitted to the emergency department yesterday with complaints of perioral paresthesias and numbness in the right arm and found to have a L thalamocapsular acute infarct. Her sx have resolved.  Recommendations   - Permissive HTN x48 hrs from sx onset goal BP <220/110. PRN labetalol or hydralazine if BP above these parameters. Avoid oral antihypertensives. - MRA H&N, infarct is in posterior circulation - TTE - Check A1c and LDL + add statin per guidelines - ASA 81mg  daily + plavix 75mg  daily x21 days f/b ASA 81mg  daily monotherapy after that - q4 hr neuro checks - STAT head CT for any change in neuro exam - Tele - PT/OT/SLP - Stroke education - Amb referral to neurology upon discharge   I will f/u  on MRA H&N and TTE results, please let me know if you have any additional  questions. ______________________________________________________________________   Thank you for the opportunity to take part in the care of this patient. If you have any further questions, please contact the neurology consultation attending.  Signed,  Su Monks, MD [facility] [phone]  If 7pm- 7am, please page neurology on call as listed in [place].

## 2021-03-27 NOTE — Evaluation (Signed)
Physical Therapy Evaluation Patient Details Name: Barbara Vega MRN: [id number] DOB: 1933/11/04 Today's Date: 03/27/2021  History of Present Illness  Pt is a 87 y.o. F arriving to ED to ED c/o  paresthesia of lip and R arm - elbow to wrist. PMH includes osteoporosis, HTN, macular degeneration, anemia, , Alzheimer's dementia, DDD lumbar spine.  Clinical Impression  Pt alert and agreeable to therapy. Pt states independence with ADLs and mobility tasks, contacted pt's daughter Barbara Vega) who agreed with overall home set-up pt provided and notes pt typically has a sitter that stays with her. If pt is to return home, pt's daughter indicated they would get or do whatever is necessary for equipment and safety. Pt appears to be close to baseline functionally but does demonstrate general weakness and slight instability as noted by pt reaching for walls/furniture with abrupt need to sit down after standing. Pt was able to amb around nursing station w/ sup w/ RW, also demonstrates amb without AD (baseline function) but continues to reach for objects for support. Discharge recommendations are HHPT w/ constant supervision to further improve functional mobility. Skilled PT intervention is indicated to address deficits in function, mobility, and to return to PLOF as able.       Recommendations for follow up therapy are one component of a multi-disciplinary discharge planning process, led by the attending physician.  Recommendations may be updated based on patient status, additional functional criteria and insurance authorization.  Follow Up Recommendations Home health PT    Assistance Recommended at Discharge Frequent or constant Supervision/Assistance  Functional Status Assessment Patient has had a recent decline in their functional status and demonstrates the ability to make significant improvements in function in a reasonable and predictable amount of time.  Equipment Recommendations  None recommended by PT     Recommendations for Other Services       Precautions / Restrictions Precautions Precautions: Fall Restrictions Weight Bearing Restrictions: No      Mobility  Bed Mobility Overal bed mobility: Modified Independent             General bed mobility comments: HOB elevated    Transfers Overall transfer level: Needs assistance Equipment used: None Transfers: Sit to/from Stand Sit to Stand: Supervision           General transfer comment: X 2 STS w/ and w/o RW cues for technqiue w/ RW; upon standing pt endorses weakness needing to sit abruptly, no further issue w/ remaining tx    Ambulation/Gait Ambulation/Gait assistance: Supervision Gait Distance (Feet): 220 Feet Assistive device: Rolling walker (2 wheels);None Gait Pattern/deviations: Step-through pattern;Decreased step length - right;Decreased step length - left       General Gait Details: Pt reaches for furniture and walls without AD but no LOB with slow, steady gait ~ 40 ft; RW around nursing station  Stairs            Wheelchair Mobility    Modified Rankin (Stroke Patients Only)       Balance Overall balance assessment: Needs assistance Sitting-balance support: Feet supported Sitting balance-Leahy Scale: Good Sitting balance - Comments: able to turn shoulders and reach outside BOS without LOB   Standing balance support: During functional activity Standing balance-Leahy Scale: Fair Standing balance comment: does not require BUE support but can't tolerate moderate challenges                             Pertinent Vitals/Pain Pain Assessment: Faces Faces  Pain Scale: Hurts a little bit Pain Location: Back Pain Descriptors / Indicators: Aching;Grimacing Pain Intervention(s): Monitored during session    Home Living Family/patient expects to be discharged to:: Private residence Living Arrangements: Alone Available Help at Discharge: Family;Available 24 hours/day;Other (Comment)  (Sitter during week days) Type of Home: House Home Access: Stairs to enter Entrance Stairs-Rails: None (currently being installed) Technical brewer of Steps: 2   Home Layout: Two level;Able to live on main level with bedroom/bathroom Home Equipment: Rolling Walker (2 wheels) Additional Comments: Per pt's daughter Barbara Vega, her father has DME if needed and she will bring over    Prior Function Prior Level of Function : Independent/Modified Independent;History of Falls (last six months)             Mobility Comments: One fall bringing pt to ED, no falls prior to that w/ both pt and daughter reporting full indep for ADL and mobility but does have a sister 2/2 dementia ADLs Comments: Indep w ADLs, works in the yard, no longer drives w/ friends/family bringing supplies and taking to appts     Hand Dominance        Extremity/Trunk Assessment   Upper Extremity Assessment Upper Extremity Assessment: Generalized weakness (n/t to finger tips intermittently R>L)    Lower Extremity Assessment Lower Extremity Assessment: Generalized weakness (No coordination deficits (F2N, Heel to shin); SILT BLE w/ gen weakness)       Communication   Communication: No difficulties  Cognition Arousal/Alertness: Awake/alert Behavior During Therapy: Flat affect Overall Cognitive Status: History of cognitive impairments - at baseline                                 General Comments: Oriented to self, disoriented to situation and place; did provide accurate information for PLOF and home environment as confiremd by daugther        General Comments General comments (skin integrity, edema, etc.): L eye swelling/redness 2/2 fall    Exercises Other Exercises Other Exercises: Pt edu: RW usage when returning home vs furniture walking   Assessment/Plan    PT Assessment Patient needs continued PT services  PT Problem List Decreased strength;Decreased range of motion;Decreased activity  tolerance;Decreased balance;Decreased mobility;Decreased cognition       PT Treatment Interventions DME instruction;Gait training;Stair training;Functional mobility training;Therapeutic activities;Therapeutic exercise;Neuromuscular re-education    PT Goals (Current goals can be found in the Care Plan section)  Acute Rehab PT Goals Patient Stated Goal: To go home PT Goal Formulation: With patient Time For Goal Achievement: 04/10/21 Potential to Achieve Goals: Good    Frequency Min 2X/week   Barriers to discharge        Co-evaluation               AM-PAC PT "6 Clicks" Mobility  Outcome Measure Help needed turning from your back to your side while in a flat bed without using bedrails?: None Help needed moving from lying on your back to sitting on the side of a flat bed without using bedrails?: None Help needed moving to and from a bed to a chair (including a wheelchair)?: A Little Help needed standing up from a chair using your arms (e.g., wheelchair or bedside chair)?: A Little Help needed to walk in hospital room?: A Little Help needed climbing 3-5 steps with a railing? : A Little 6 Click Score: 20    End of Session Equipment Utilized During Treatment: Gait belt Activity  Tolerance: Patient tolerated treatment well Patient left: in chair;Other (comment) (OT) Nurse Communication: Mobility status PT Visit Diagnosis: Other abnormalities of gait and mobility (R26.89);Muscle weakness (generalized) (M62.81)    Time: 8592-9244 PT Time Calculation (min) (ACUTE ONLY): 27 min   Charges:              [employer], SPT

## 2021-03-27 NOTE — Evaluation (Signed)
Occupational Therapy Evaluation Patient Details Name: Barbara Vega MRN: [id number] DOB: 1933/07/12 Today's Date: 03/27/2021   History of Present Illness Pt is a 87 y.o. F arriving to ED to ED c/o  paresthesia of lip and R arm - elbow to wrist. PMH includes osteoporosis, HTN, macular degeneration, anemia, , Alzheimer's dementia, DDD lumbar spine.   Clinical Impression   Patient presenting with decreased Ind in self care, balance, functional mobility/transfers, endurance, and safety awareness. Patient reports being ind at baseline and living alone PTA. Per chart review, family reports pt has a sitter that is at home with her and they are able to assist as needed for her to return home. Patient currently functioning at supervision overall for functional mobility within the room and for toileting needs without use of AD. Patient will benefit from acute OT to increase overall independence in the areas of ADLs, functional mobility, in order to safely discharge home.      Recommendations for follow up therapy are one component of a multi-disciplinary discharge planning process, led by the attending physician.  Recommendations may be updated based on patient status, additional functional criteria and insurance authorization.   Follow Up Recommendations  No OT follow up    Assistance Recommended at Discharge Set up Supervision/Assistance     Equipment Recommendations  None recommended by OT       Precautions / Restrictions Precautions Precautions: Fall Restrictions Weight Bearing Restrictions: No      Mobility Bed Mobility Overal bed mobility: Modified Independent             General bed mobility comments: HOB elevated    Transfers Overall transfer level: Needs assistance Equipment used: None Transfers: Sit to/from Stand Sit to Stand: Supervision           General transfer comment: X 2 STS w/ and w/o RW cues for technqiue w/ RW; upon standing pt endorses weakness needing  to sit abruptly, no further issue w/ remaining tx      Balance Overall balance assessment: Needs assistance Sitting-balance support: Feet supported Sitting balance-Leahy Scale: Good Sitting balance - Comments: able to turn shoulders and reach outside BOS without LOB   Standing balance support: During functional activity Standing balance-Leahy Scale: Fair Standing balance comment: does not require BUE support but can't tolerate moderate challenges                           ADL either performed or assessed with clinical judgement   ADL Overall ADL's : Needs assistance/impaired                         Toilet Transfer: Supervision/safety   Toileting- Clothing Manipulation and Hygiene: Supervision/safety               Vision Baseline Vision/History: 1 Wears glasses              Pertinent Vitals/Pain Pain Assessment: No/denies pain Faces Pain Scale: Hurts a little bit Pain Location: Back Pain Descriptors / Indicators: Aching;Grimacing Pain Intervention(s): Monitored during session     Hand Dominance Right   Extremity/Trunk Assessment Upper Extremity Assessment Upper Extremity Assessment: Generalized weakness   Lower Extremity Assessment Lower Extremity Assessment: Generalized weakness       Communication Communication Communication: No difficulties   Cognition Arousal/Alertness: Awake/alert Behavior During Therapy: Flat affect Overall Cognitive Status: History of cognitive impairments - at baseline  General Comments: Pt is oriented to self and location. She is pleasant and cooperative and follows commands with increased time.     General Comments  L eye swelling/redness 2/2 fall    Exercises Other Exercises Other Exercises: Pt edu: RW usage when returning home vs furniture walking   Shoulder Instructions      Home Living Family/patient expects to be discharged to:: Private  residence Living Arrangements: Alone Available Help at Discharge: Family;Available 24 hours/day;Other (Comment) Type of Home: House Home Access: Stairs to enter [employer] of Steps: 2 Entrance Stairs-Rails: None Home Layout: Two level;Able to live on main level with bedroom/bathroom               Home Equipment: Rolling Walker (2 wheels)   Additional Comments: Per pt's daughter [name], her father has DME if needed and she will bring over      Prior Functioning/Environment Prior Level of Function : Independent/Modified Independent;History of Falls (last six months)             Mobility Comments: One fall bringing pt to ED, no falls prior to that w/ both pt and daughter reporting full indep for ADL and mobility but does have a sister 2/2 dementia ADLs Comments: Indep w ADLs, works in the yard, no longer drives w/ friends/family bringing supplies and taking to appts        OT Problem List: Decreased strength;Decreased activity tolerance;Impaired balance (sitting and/or standing);Decreased safety awareness;Decreased cognition      OT Treatment/Interventions: Self-care/ADL training;Therapeutic exercise;Therapeutic activities;Energy conservation;DME and/or AE instruction;Patient/family education;Balance training    OT Goals(Current goals can be found in the care plan section) Acute Rehab OT Goals Patient Stated Goal: to go home OT Goal Formulation: With patient Time For Goal Achievement: 04/10/21 Potential to Achieve Goals: Good ADL Goals Pt Will Perform Grooming: Independently Pt Will Perform Lower Body Dressing: Independently Pt Will Transfer to Toilet: Independently Pt Will Perform Toileting - Clothing Manipulation and hygiene: Independently  OT Frequency: Min 2X/week   Barriers to D/C:    none known at this time          AM-PAC OT "6 Clicks" Daily Activity     Outcome Measure Help from another person eating meals?: None Help from another person  taking care of personal grooming?: None Help from another person toileting, which includes using toliet, bedpan, or urinal?: None Help from another person bathing (including washing, rinsing, drying)?: None Help from another person to put on and taking off regular upper body clothing?: None Help from another person to put on and taking off regular lower body clothing?: None 6 Click Score: 24   End of Session Nurse Communication: Mobility status  Activity Tolerance: Patient tolerated treatment well Patient left: in chair;with call bell/phone within reach;with chair alarm set  OT Visit Diagnosis: Unsteadiness on feet (R26.81);Muscle weakness (generalized) (M62.81)                Time: 1829-9371 OT Time Calculation (min): 14 min Charges:  OT General Charges $OT Visit: 1 Visit OT Evaluation $OT Eval Low Complexity: 1 Low OT Treatments $Self Care/Home Management : 8-22 mins Darleen Crocker, MS, OTR/L , CBIS ascom [phone]  03/27/21, 1:17 PM

## 2021-03-27 NOTE — Progress Notes (Signed)
PROGRESS NOTE    Barbara Vega  KGM:[id number] DOB: [birth year] DOA: 03/26/2021 PCP: [name], MD    Brief Narrative:  Barbara Vega is a 87 y.o. female seen in ed with complaints of paresthesia of lip and rt  Arm- elbow to wrist.  Per report patient has been having symptoms for about a week and they have been intermittent.  The symptoms resolved for about 2 days and then recurred again today morning and decided to come to the hospital today.  Her daughter Ms. Lois at bedside stated that her symptoms occurred on Thursday and then again today morning.  In between patient was independent active functional and blowing leaves with a leaf blower just yesterday  11/11 complaining of her lips being numb and feeling funny. No other complaints.  Consultants:  Neurology  Procedures:   Antimicrobials:      Subjective: No shortness of breath, headache, double vision, dizziness  Objective: Vitals:   03/27/21 0200 03/27/21 0400 03/27/21 0600 03/27/21 0755  BP: (!) 113/53 (!) 174/62 131/60 (!) 143/55  Pulse: 67   66  Resp: 19 (!) 23 19 18   Temp:    97.9 F (36.6 C)  TempSrc:    Oral  SpO2: 96% 95% 96% 97%  Weight:      Height:        Intake/Output Summary (Last 24 hours) at 03/27/2021 0845 Last data filed at 03/27/2021 0547 Gross per 24 hour  Intake 950.44 ml  Output --  Net 950.44 ml   Filed Weights   03/26/21 1207 03/26/21 2115  Weight: 61.2 kg 58.5 kg    Examination:  General exam: Appears calm and comfortable  Respiratory system: Clear to auscultation. Respiratory effort normal. Cardiovascular system: S1 & S2 heard, RRR. No JVD, murmurs, rubs, gallops or clicks. Gastrointestinal system: Abdomen is nondistended, soft and nontender. lt. Normal bowel sounds heard. Central nervous system: Alert and oriented x2, not to date.  No facial droop.  5 out of 5 x 4 strength.  Finger-to-nose stable.  Extremities: No edema Psychiatry: Mood & affect appropriate.     Data  Reviewed: I have personally reviewed following labs and imaging studies  CBC: Recent Labs  Lab 03/26/21 1226 03/27/21 0620  WBC 6.6 6.6  NEUTROABS 4.0  --   HGB 16.5* 14.7  HCT 48.9* 44.1  MCV 89.9 90.7  PLT 534* 644*   Basic Metabolic Panel: Recent Labs  Lab 03/26/21 1226 03/26/21 2149  NA 135 133*  K 3.6 3.8  CL 102 100  CO2 25 27  GLUCOSE 116* 126*  BUN 13 10  CREATININE 0.70 0.55  CALCIUM 9.2 8.9  PHOS  --  2.7   GFR: Estimated Creatinine Clearance: 40.7 mL/min (by C-G formula based on SCr of 0.55 mg/dL). Liver Function Tests: Recent Labs  Lab 03/26/21 1226 03/26/21 2149  AST 19 20  ALT 8 9  ALKPHOS 57 51  BILITOT 0.8 0.9  PROT 7.0 6.7  ALBUMIN 3.9 4.0   No results for input(s): LIPASE, AMYLASE in the last 168 hours. No results for input(s): AMMONIA in the last 168 hours. Coagulation Profile: Recent Labs  Lab 03/26/21 1226  INR 1.1   Cardiac Enzymes: No results for input(s): CKTOTAL, CKMB, CKMBINDEX, TROPONINI in the last 168 hours. BNP (last 3 results) No results for input(s): PROBNP in the last 8760 hours. HbA1C: No results for input(s): HGBA1C in the last 72 hours. CBG: No results for input(s): GLUCAP in the last 168 hours. Lipid  Profile: Recent Labs    03/27/21 0620  CHOL 150  HDL 69  LDLCALC 73  TRIG 41  CHOLHDL 2.2   Thyroid Function Tests: Recent Labs    03/26/21 2149  TSH 2.114  FREET4 0.90   Anemia Panel: No results for input(s): VITAMINB12, FOLATE, FERRITIN, TIBC, IRON, RETICCTPCT in the last 72 hours. Sepsis Labs: No results for input(s): PROCALCITON, LATICACIDVEN in the last 168 hours.  Recent Results (from the past 240 hour(s))  Resp Panel by RT-PCR (Flu A&B, Covid) Nasopharyngeal Swab     Status: None   Collection Time: 03/26/21  7:48 PM   Specimen: Nasopharyngeal Swab; Nasopharyngeal(NP) swabs in vial transport medium  Result Value Ref Range Status   SARS Coronavirus 2 by RT PCR NEGATIVE NEGATIVE Final     Comment: (NOTE) SARS-CoV-2 target nucleic acids are NOT DETECTED.  The SARS-CoV-2 RNA is generally detectable in upper respiratory specimens during the acute phase of infection. The lowest concentration of SARS-CoV-2 viral copies this assay can detect is 138 copies/mL. A negative result does not preclude SARS-Cov-2 infection and should not be used as the sole basis for treatment or other patient management decisions. A negative result may occur with  improper specimen collection/handling, submission of specimen other than nasopharyngeal swab, presence of viral mutation(s) within the areas targeted by this assay, and inadequate number of viral copies(<138 copies/mL). A negative result must be combined with clinical observations, patient history, and epidemiological information. The expected result is Negative.  Fact Sheet for Patients:  EntrepreneurPulse.com.au  Fact Sheet for Healthcare Providers:  IncredibleEmployment.be  This test is no t yet approved or cleared by the [place] FDA and  has been authorized for detection and/or diagnosis of SARS-CoV-2 by FDA under an Emergency Use Authorization (EUA). This EUA will remain  in effect (meaning this test can be used) for the duration of the COVID-19 declaration under Section 564(b)(1) of the Act, 21 U.S.C.section 360bbb-3(b)(1), unless the authorization is terminated  or revoked sooner.       Influenza A by PCR NEGATIVE NEGATIVE Final   Influenza B by PCR NEGATIVE NEGATIVE Final    Comment: (NOTE) The Xpert Xpress SARS-CoV-2/FLU/RSV plus assay is intended as an aid in the diagnosis of influenza from Nasopharyngeal swab specimens and should not be used as a sole basis for treatment. Nasal washings and aspirates are unacceptable for Xpert Xpress SARS-CoV-2/FLU/RSV testing.  Fact Sheet for Patients: EntrepreneurPulse.com.au  Fact Sheet for Healthcare  Providers: IncredibleEmployment.be  This test is not yet approved or cleared by the [place] FDA and has been authorized for detection and/or diagnosis of SARS-CoV-2 by FDA under an Emergency Use Authorization (EUA). This EUA will remain in effect (meaning this test can be used) for the duration of the COVID-19 declaration under Section 564(b)(1) of the Act, 21 U.S.C. section 360bbb-3(b)(1), unless the authorization is terminated or revoked.  Performed at [facility], [address] [place], [place] [address]          Radiology Studies: CT HEAD WO CONTRAST  Result Date: 03/26/2021 CLINICAL DATA:  Generalized weakness EXAM: CT HEAD WITHOUT CONTRAST TECHNIQUE: Contiguous axial images were obtained from the base of the skull through the vertex without intravenous contrast. COMPARISON:  10/26/2020 FINDINGS: Brain: No evidence of acute infarction, hemorrhage, hydrocephalus, extra-axial collection or mass lesion/mass effect. Moderate low-density changes within the periventricular and subcortical white matter compatible with chronic microvascular ischemic change. Mild diffuse cerebral volume loss. Vascular: Atherosclerotic calcifications involving the large vessels of  the skull base. No unexpected hyperdense vessel. Skull: Normal. Negative for fracture or focal lesion. Sinuses/Orbits: No acute finding. Other: None. IMPRESSION: 1. No acute intracranial findings. 2. Chronic microvascular ischemic change and cerebral volume loss. Electronically Signed   By: Davina Poke D.O.   On: 03/26/2021 12:44   MR BRAIN WO CONTRAST  Result Date: 03/26/2021 EXAM: MRI HEAD WITHOUT CONTRAST TECHNIQUE: Multiplanar, multiecho pulse sequences of the brain and surrounding structures were obtained without intravenous contrast. COMPARISON:  MRI August 20, 2019. FINDINGS: Brain: Acute left thalamocapsular infarcts. Mild edema without mass effect. Moderate scattered T2  hyperintensities in the white matter, nonspecific but compatible with chronic microvascular ischemic disease. No hydrocephalus, mass lesion, midline shift, or extra-axial fluid collection. No acute hemorrhage. Small foci of susceptibility artifact within bilateral parietal temporal and left occipital regions, likely chronic microhemorrhages. High-resolution T2 imaging was performed through the internal auditory canals. Unremarkable appearance of the seventh and eighth cranial nerves bilaterally. No obvious retrocochlear mass on this noncontrast study. Normal CSF signal within bilateral labyrinth. Vascular: Major arterial flow voids are maintained at the skull base. Skull and upper cervical spine: Normal marrow signal. Sinuses/Orbits: Clear sinuses.  Unremarkable orbits. Other: Trace right mastoid fluid IMPRESSION: 1. Acute left thalamocapsular infarcts. Mild edema without mass effect. 2. Moderate chronic microvascular disease. Electronically Signed   By: Margaretha Sheffield M.D.   On: 03/26/2021 17:34   US Carotid Bilateral (at [facility] and [facility] only)  Result Date: 03/27/2021 CLINICAL DATA:  Cerebrovascular accident EXAM: BILATERAL CAROTID DUPLEX ULTRASOUND TECHNIQUE: Pearline Cables scale imaging, color Doppler and duplex ultrasound were performed of bilateral carotid and vertebral arteries in the neck. COMPARISON:  None. FINDINGS: Criteria: Quantification of carotid stenosis is based on velocity parameters that correlate the residual internal carotid diameter with NASCET-based stenosis levels, using the diameter of the distal internal carotid lumen as the denominator for stenosis measurement. The following velocity measurements were obtained: RIGHT ICA: 63/13 cm/sec CCA: 75/91 cm/sec SYSTOLIC ICA/CCA RATIO:  0.9 ECA:  95 cm/sec LEFT ICA: 58/10 cm/sec CCA: 63/8 cm/sec SYSTOLIC ICA/CCA RATIO:  0.9 ECA:  97 cm/sec RIGHT CAROTID ARTERY: No significant atherosclerotic plaque or evidence of stenosis in the internal carotid artery.  RIGHT VERTEBRAL ARTERY:  Patent with normal antegrade flow. LEFT CAROTID ARTERY: Mild focal heterogeneous atherosclerotic plaque in the proximal internal carotid artery. By peak systolic velocity criteria, the estimated stenosis is less than 50%. LEFT VERTEBRAL ARTERY:  Patent with normal antegrade flow. IMPRESSION: 1. No significant atherosclerotic plaque or evidence of stenosis in the right internal carotid artery. 2. Mild (1-49%) stenosis proximal left internal carotid artery secondary to heterogenous atherosclerotic plaque. 3. Vertebral arteries are patent with normal antegrade flow. Electronically Signed   By: Jacqulynn Cadet M.D.   On: 03/27/2021 06:50        Scheduled Meds:  aspirin EC  81 mg Oral Daily   atorvastatin  80 mg Oral Daily   divalproex  250 mg Oral Daily   donepezil  5 mg Oral QHS   heparin  5,000 Units Subcutaneous Q8H   Continuous Infusions:  sodium chloride 100 mL/hr at 03/27/21 0547    Assessment & Plan:   Principal Problem:   Paresthesia Active Problems:   Essential hypertension   GERD (gastroesophageal reflux disease)   Acute CVA (cerebrovascular accident) [facility])  Patient is a 87 year old female with history of hypertension, macular degeneration admitted to the emergency department with complaints of perioral paresthesias and numbness in the right arm, found.  Left thalamocapsular acute  infarct Still with perioral paresthesias Neurology input was appreciated Permissive HTN x48hours Iv hydralazine prn if above 220/110 Avoid oral antihypertensives MRA Echo pending LDL goal <70, statin  Ck A1c Asa +plavix x21 days, f/b asa 81mg  monotherapy after that Neuro ck q4hrs Stat CT head if neuro exam changes Telemetry  PT/OT  Stroke education  Neurology f/u as outpt   Hypertension: Need permissive htn as above Hydralazine prn     Dementia- start her Aricept  DVT prophylaxis: Lovenox Code Status full Family Communication: None at  bedside Disposition Plan:  Status is: Inpatient  Remains inpatient appropriate because: Still requiring work-up and she is symptomatic.            LOS: 1 day   Time spent: 45 minutes with more than 50% on [place], MD [facility] Pager [phone]  If 7PM-7AM, please contact night-coverage 03/27/2021, 8:45 AM

## 2021-03-27 NOTE — Progress Notes (Signed)
*  PRELIMINARY RESULTS* Echocardiogram 2D Echocardiogram has been performed.  Barbara Vega 03/27/2021, 12:27 PM

## 2021-03-27 NOTE — Progress Notes (Signed)
SLP Cancellation Note  Patient Details Name: ELLY HAFFEY MRN: [id number] DOB: 16-Feb-1934   Cancelled treatment:       Reason Eval/Treat Not Completed: SLP screened, no needs identified, will sign off   Keirstan Iannello 03/27/2021, 1:18 PM

## 2021-03-28 ENCOUNTER — Inpatient Hospital Stay: Payer: Medicare Other

## 2021-03-28 MED ORDER — CLOPIDOGREL BISULFATE 75 MG PO TABS: 75.0000 mg | ORAL_TABLET | Freq: Every day | ORAL | 0 refills | Status: AC

## 2021-03-28 MED ORDER — GADOBUTROL 1 MMOL/ML IV SOLN
6.0000 mL | Freq: Once | INTRAVENOUS | Status: AC | PRN
  Administered 2021-03-28: 6 mL via INTRAVENOUS

## 2021-03-28 MED ORDER — ASPIRIN 81 MG PO TBEC: 81.0000 mg | DELAYED_RELEASE_TABLET | Freq: Every day | ORAL | 0 refills | Status: AC

## 2021-03-28 MED ORDER — ATORVASTATIN CALCIUM 80 MG PO TABS: 80.0000 mg | ORAL_TABLET | Freq: Every day | ORAL | 0 refills | Status: DC

## 2021-03-28 NOTE — Discharge Summary (Signed)
Antonieta Slaven Shere WTU:[id number] DOB: 08/01/1933 DOA: 03/26/2021  PCP: [name], MD  Admit date: 03/26/2021 Discharge date: 03/28/2021  Admitted From: home Disposition:  home  Recommendations for Outpatient Follow-up:  Follow up with PCP in 1 week Please obtain BMP/CBC in one week Please follow up in one week  Home Health:yes    Discharge Condition:Stable CODE STATUS:FULL  Diet recommendation: Heart Healthy  Brief/Interim Summary: Per HPI: This is a 87 year old woman with a past medical history significant for hypertension and macular degeneration admitted to the emergency department yesterday with complaints of perioral paresthesias and numbness in the right arm.She is very functional and independent at baseline.     Left thalamocapsular acute infarct MRI brain was notable for a left thalamocapsular ischemic infarct.  Carotid ultrasound did not show any hemodynamically significant stenoses. Neurology was consulted MRA with Severe proximal left P2 stenosis. Left PCA is patent but attenuated distally. Please see full results below LDL goal <70 Continue with  statin  HA1C is 5.0 Echo nml EF, no ADS. Since MRA with severe prox stenosis of Lt P2 as above, neurology recommended for pt to take plavix +asa for 90 days , then monotherapy with ASA only. [facility] Stroke education Follow up with primary neurologist as outpatient      Hypertension: Continue home meds. Initially permissive HTN was the goal     Dementia- continue home Aricept  GERD: continue with PPI  Discharge Diagnoses:  Principal Problem:   Paresthesia Active Problems:   Essential hypertension   GERD (gastroesophageal reflux disease)   Acute CVA (cerebrovascular accident) [facility])    Discharge Instructions  Discharge Instructions     Call MD for:  persistant dizziness or light-headedness   Complete by: As directed    Diet - low sodium heart healthy   Complete by: As directed    Discharge instructions    Complete by: As directed    Take asprin and plavix for 90 days, then after only aspirin F/u with her primary neurologist   Increase activity slowly   Complete by: As directed       Allergies as of 03/28/2021       Reactions   Codone [hydrocodone] Nausea And Vomiting        Medication List     STOP taking these medications    potassium chloride SA 20 MEQ tablet Commonly known as: KLOR-CON       TAKE these medications    amLODipine 2.5 MG tablet Commonly known as: NORVASC Take 2.5 mg by mouth daily.   aspirin 81 MG EC tablet Take 1 tablet (81 mg total) by mouth daily. Swallow whole. Start taking on: March 29, 2021   atorvastatin 80 MG tablet Commonly known as: LIPITOR Take 1 tablet (80 mg total) by mouth daily. Start taking on: March 29, 2021   clopidogrel 75 MG tablet Commonly known as: PLAVIX Take 1 tablet (75 mg total) by mouth daily. Start taking on: March 29, 2021   divalproex 125 MG DR tablet Commonly known as: DEPAKOTE Take 250-375 mg by mouth 2 (two) times daily.   donepezil 5 MG tablet Commonly known as: ARICEPT Take 5 mg by mouth at bedtime.   pantoprazole 40 MG tablet Commonly known as: Protonix Take 1 tablet (40 mg total) by mouth daily.   PreserVision AREDS 2+Multi Vit Caps Take 1 capsule by mouth at bedtime.        Follow-up Information     Vladimir Crofts, MD Follow up.  Specialty: Neurology Why: or anyone in his group Contact information: [facility] [place] [address] [phone]         [name], MD Follow up in 1 week(s).   Specialty: Family Medicine Contact information: [address] [place] [address] [phone]                Allergies  Allergen Reactions   Codone [Hydrocodone] Nausea And Vomiting    Consultations: neurology   Procedures/Studies: CT HEAD WO CONTRAST  Result Date: 03/26/2021 CLINICAL  DATA:  Generalized weakness EXAM: CT HEAD WITHOUT CONTRAST TECHNIQUE: Contiguous axial images were obtained from the base of the skull through the vertex without intravenous contrast. COMPARISON:  10/26/2020 FINDINGS: Brain: No evidence of acute infarction, hemorrhage, hydrocephalus, extra-axial collection or mass lesion/mass effect. Moderate low-density changes within the periventricular and subcortical white matter compatible with chronic microvascular ischemic change. Mild diffuse cerebral volume loss. Vascular: Atherosclerotic calcifications involving the large vessels of the skull base. No unexpected hyperdense vessel. Skull: Normal. Negative for fracture or focal lesion. Sinuses/Orbits: No acute finding. Other: None. IMPRESSION: 1. No acute intracranial findings. 2. Chronic microvascular ischemic change and cerebral volume loss. Electronically Signed   By: Davina Poke D.O.   On: 03/26/2021 12:44   MR ANGIO HEAD WO CONTRAST  Result Date: 03/28/2021 CLINICAL DATA:  Initial evaluation for neuro deficit, stroke, carotid stenosis screening. EXAM: MRA HEAD WITHOUT CONTRAST MRA NECK WITHOUT AND WITH CONTRAST TECHNIQUE: Angiographic images of the Circle of Willis were acquired using MRA technique without intravenous contrast. Angiographic images of the neck were acquired using MRA technique without and with intravenous contrast. Carotid stenosis measurements (when applicable) are obtained utilizing NASCET criteria, using the distal internal carotid diameter as the denominator. CONTRAST:  45mL GADAVIST GADOBUTROL 1 MMOL/ML IV SOLN COMPARISON:  Prior brain MRI from 03/26/2021 FINDINGS: MRA HEAD FINDINGS Anterior circulation: Visualized distal cervical segments of the internal carotid arteries are patent with antegrade flow. Petrous, cavernous, and supraclinoid segments patent without stenosis. 3 mm focal outpouching extending from the cavernous right ICA is seen, suspicious for a small paraophthalmic aneurysm  (series 1042, image 6). This is directed laterally and slightly anteriorly. A1 segments patent. Normal anterior communicating artery complex. Anterior cerebral arteries patent to their distal aspects without stenosis. No M1 stenosis or occlusion. Normal MCA bifurcations. Distal MCA branches well perfused and symmetric. Posterior circulation: Both vertebral arteries patent to the vertebrobasilar junction without stenosis. Left vertebral artery slightly dominant. Both PICA origins patent and normal. Basilar patent to its distal aspect without stenosis. Superior cerebellar arteries patent bilaterally. Both PCAs primarily supplied via the basilar. Right PCA widely patent to its distal aspect. There is a focal severe stenosis involving the proximal left P2 segment (series 9, image 113). While the left PCA appears occluded at the P2 segment on MIP reconstructions, it does appear to be patent distally on source time-of-flight sequence, although is attenuated as compared to the right. Anatomic variants: None significant. MRA NECK FINDINGS Aortic arch: Partially visualized aortic arch grossly within normal limits for caliber with normal branch pattern. No visible stenosis or other abnormality about the origin the great vessels. Right carotid system: Visualized right CCA patent to the bifurcation without stenosis. No significant atheromatous narrowing or irregularity about the right carotid bulb. Visualized right ICA patent distally without stenosis, evidence for dissection or occlusion. Left carotid system: Visualized left CCA patent without stenosis. No significant atheromatous narrowing about the left carotid  bulb by MRA. Visualized left ICA patent without stenosis, evidence for dissection or occlusion. Vertebral arteries: Neither vertebral artery origin well visualized on this exam. Visualized portions of the vertebral arteries patent with antegrade flow. No evidence for dissection or other acute vascular abnormality.  Left vertebral artery slightly dominant. Other: None IMPRESSION: MRA HEAD IMPRESSION: 1. Severe proximal left P2 stenosis. Left PCA is patent but attenuated distally. 2. Otherwise wide patency of the intracranial arterial circulation. No other proximal high-grade or correctable stenosis. 3. 3 mm right paraophthalmic aneurysm. MRA NECK IMPRESSION: Negative MRA of the neck without hemodynamically significant stenosis or other acute vascular abnormality. Electronically Signed   By: Jeannine Boga M.D.   On: 03/28/2021 05:13   MR ANGIO NECK W WO CONTRAST  Result Date: 03/28/2021 CLINICAL DATA:  Initial evaluation for neuro deficit, stroke, carotid stenosis screening. EXAM: MRA HEAD WITHOUT CONTRAST MRA NECK WITHOUT AND WITH CONTRAST TECHNIQUE: Angiographic images of the Circle of Willis were acquired using MRA technique without intravenous contrast. Angiographic images of the neck were acquired using MRA technique without and with intravenous contrast. Carotid stenosis measurements (when applicable) are obtained utilizing NASCET criteria, using the distal internal carotid diameter as the denominator. CONTRAST:  93mL GADAVIST GADOBUTROL 1 MMOL/ML IV SOLN COMPARISON:  Prior brain MRI from 03/26/2021 FINDINGS: MRA HEAD FINDINGS Anterior circulation: Visualized distal cervical segments of the internal carotid arteries are patent with antegrade flow. Petrous, cavernous, and supraclinoid segments patent without stenosis. 3 mm focal outpouching extending from the cavernous right ICA is seen, suspicious for a small paraophthalmic aneurysm (series 1042, image 6). This is directed laterally and slightly anteriorly. A1 segments patent. Normal anterior communicating artery complex. Anterior cerebral arteries patent to their distal aspects without stenosis. No M1 stenosis or occlusion. Normal MCA bifurcations. Distal MCA branches well perfused and symmetric. Posterior circulation: Both vertebral arteries patent to the  vertebrobasilar junction without stenosis. Left vertebral artery slightly dominant. Both PICA origins patent and normal. Basilar patent to its distal aspect without stenosis. Superior cerebellar arteries patent bilaterally. Both PCAs primarily supplied via the basilar. Right PCA widely patent to its distal aspect. There is a focal severe stenosis involving the proximal left P2 segment (series 9, image 113). While the left PCA appears occluded at the P2 segment on MIP reconstructions, it does appear to be patent distally on source time-of-flight sequence, although is attenuated as compared to the right. Anatomic variants: None significant. MRA NECK FINDINGS Aortic arch: Partially visualized aortic arch grossly within normal limits for caliber with normal branch pattern. No visible stenosis or other abnormality about the origin the great vessels. Right carotid system: Visualized right CCA patent to the bifurcation without stenosis. No significant atheromatous narrowing or irregularity about the right carotid bulb. Visualized right ICA patent distally without stenosis, evidence for dissection or occlusion. Left carotid system: Visualized left CCA patent without stenosis. No significant atheromatous narrowing about the left carotid bulb by MRA. Visualized left ICA patent without stenosis, evidence for dissection or occlusion. Vertebral arteries: Neither vertebral artery origin well visualized on this exam. Visualized portions of the vertebral arteries patent with antegrade flow. No evidence for dissection or other acute vascular abnormality. Left vertebral artery slightly dominant. Other: None IMPRESSION: MRA HEAD IMPRESSION: 1. Severe proximal left P2 stenosis. Left PCA is patent but attenuated distally. 2. Otherwise wide patency of the intracranial arterial circulation. No other proximal high-grade or correctable stenosis. 3. 3 mm right paraophthalmic aneurysm. MRA NECK IMPRESSION: Negative MRA of the neck without  hemodynamically significant stenosis or other acute vascular abnormality. Electronically Signed   By: Jeannine Boga M.D.   On: 03/28/2021 05:13   MR BRAIN WO CONTRAST  Result Date: 03/26/2021 EXAM: MRI HEAD WITHOUT CONTRAST TECHNIQUE: Multiplanar, multiecho pulse sequences of the brain and surrounding structures were obtained without intravenous contrast. COMPARISON:  MRI August 20, 2019. FINDINGS: Brain: Acute left thalamocapsular infarcts. Mild edema without mass effect. Moderate scattered T2 hyperintensities in the white matter, nonspecific but compatible with chronic microvascular ischemic disease. No hydrocephalus, mass lesion, midline shift, or extra-axial fluid collection. No acute hemorrhage. Small foci of susceptibility artifact within bilateral parietal temporal and left occipital regions, likely chronic microhemorrhages. High-resolution T2 imaging was performed through the internal auditory canals. Unremarkable appearance of the seventh and eighth cranial nerves bilaterally. No obvious retrocochlear mass on this noncontrast study. Normal CSF signal within bilateral labyrinth. Vascular: Major arterial flow voids are maintained at the skull base. Skull and upper cervical spine: Normal marrow signal. Sinuses/Orbits: Clear sinuses.  Unremarkable orbits. Other: Trace right mastoid fluid IMPRESSION: 1. Acute left thalamocapsular infarcts. Mild edema without mass effect. 2. Moderate chronic microvascular disease. Electronically Signed   By: Margaretha Sheffield M.D.   On: 03/26/2021 17:34   US Carotid Bilateral (at [facility] and [facility] only)  Result Date: 03/27/2021 CLINICAL DATA:  Cerebrovascular accident EXAM: BILATERAL CAROTID DUPLEX ULTRASOUND TECHNIQUE: Pearline Cables scale imaging, color Doppler and duplex ultrasound were performed of bilateral carotid and vertebral arteries in the neck. COMPARISON:  None. FINDINGS: Criteria: Quantification of carotid stenosis is based on velocity parameters that correlate the  residual internal carotid diameter with NASCET-based stenosis levels, using the diameter of the distal internal carotid lumen as the denominator for stenosis measurement. The following velocity measurements were obtained: RIGHT ICA: 63/13 cm/sec CCA: 93/73 cm/sec SYSTOLIC ICA/CCA RATIO:  0.9 ECA:  95 cm/sec LEFT ICA: 58/10 cm/sec CCA: 42/8 cm/sec SYSTOLIC ICA/CCA RATIO:  0.9 ECA:  97 cm/sec RIGHT CAROTID ARTERY: No significant atherosclerotic plaque or evidence of stenosis in the internal carotid artery. RIGHT VERTEBRAL ARTERY:  Patent with normal antegrade flow. LEFT CAROTID ARTERY: Mild focal heterogeneous atherosclerotic plaque in the proximal internal carotid artery. By peak systolic velocity criteria, the estimated stenosis is less than 50%. LEFT VERTEBRAL ARTERY:  Patent with normal antegrade flow. IMPRESSION: 1. No significant atherosclerotic plaque or evidence of stenosis in the right internal carotid artery. 2. Mild (1-49%) stenosis proximal left internal carotid artery secondary to heterogenous atherosclerotic plaque. 3. Vertebral arteries are patent with normal antegrade flow. Electronically Signed   By: Jacqulynn Cadet M.D.   On: 03/27/2021 06:50   ECHOCARDIOGRAM COMPLETE  Result Date: 03/27/2021    ECHOCARDIOGRAM REPORT   Patient Name:   KIONDRA CAICEDO Kaeser Date of Exam: 03/27/2021 Medical Rec #:  [id number]      Height:       62.0 in Accession #:    [id number]     Weight:       129.0 lb Date of Birth:  [birth year]       BSA:          1.586 m Patient Age:    87 years       BP:           105/82 mmHg Patient Gender: F              HR:           65 bpm. Exam Location:  [facility] Procedure: 2D Echo, Cardiac Doppler and Color Doppler Indications:  Acute CVA  History:         Patient has no prior history of Echocardiogram examinations.                  Risk Factors:Hypertension.  Sonographer:     Sherrie Sport Referring Phys:  [place] Diagnosing Phys: Serafina Royals MD IMPRESSIONS  1. Left ventricular  ejection fraction, by estimation, is 60 to 65%. The left ventricle has normal function. The left ventricle has no regional wall motion abnormalities. Left ventricular diastolic parameters were normal.  2. Right ventricular systolic function is normal. The right ventricular size is normal.  3. The mitral valve is normal in structure. Trivial mitral valve regurgitation.  4. The aortic valve is normal in structure. Aortic valve regurgitation is not visualized. FINDINGS  Left Ventricle: Left ventricular ejection fraction, by estimation, is 60 to 65%. The left ventricle has normal function. The left ventricle has no regional wall motion abnormalities. The left ventricular internal cavity size was normal in size. There is  no left ventricular hypertrophy. Left ventricular diastolic parameters were normal. Right Ventricle: The right ventricular size is normal. No increase in right ventricular wall thickness. Right ventricular systolic function is normal. Left Atrium: Left atrial size was normal in size. Right Atrium: Right atrial size was normal in size. Pericardium: There is no evidence of pericardial effusion. Mitral Valve: The mitral valve is normal in structure. Trivial mitral valve regurgitation. MV peak gradient, 3.7 mmHg. The mean mitral valve gradient is 1.0 mmHg. Tricuspid Valve: The tricuspid valve is normal in structure. Tricuspid valve regurgitation is trivial. Aortic Valve: The aortic valve is normal in structure. Aortic valve regurgitation is not visualized. Aortic valve mean gradient measures 4.0 mmHg. Aortic valve peak gradient measures 6.1 mmHg. Aortic valve area, by VTI measures 2.80 cm. Pulmonic Valve: The pulmonic valve was normal in structure. Pulmonic valve regurgitation is not visualized. Aorta: The aortic root and ascending aorta are structurally normal, with no evidence of dilitation. IAS/Shunts: No atrial level shunt detected by color flow Doppler.  LEFT VENTRICLE PLAX 2D LVIDd:         4.00 cm    Diastology LVIDs:         2.70 cm   LV e' medial:    4.79 cm/s LV PW:         1.00 cm   LV E/e' medial:  11.0 LV IVS:        0.85 cm   LV e' lateral:   6.42 cm/s LVOT diam:     2.00 cm   LV E/e' lateral: 8.2 LV SV:         71 LV SV Index:   45 LVOT Area:     3.14 cm  RIGHT VENTRICLE RV Basal diam:  3.00 cm RV S prime:     15.70 cm/s TAPSE (M-mode): 3.9 cm LEFT ATRIUM             Index LA diam:        3.70 cm 2.33 cm/m LA Vol (A2C):   65.3 ml 41.16 ml/m LA Vol (A4C):   74.2 ml 46.77 ml/m LA Biplane Vol: 70.0 ml 44.12 ml/m  AORTIC VALVE                    PULMONIC VALVE AV Area (Vmax):    2.35 cm     PV Vmax:        0.48 m/s AV Area (Vmean):   2.27 cm  PV Vmean:       33.500 cm/s AV Area (VTI):     2.80 cm     PV VTI:         0.078 m AV Vmax:           123.00 cm/s  PV Peak grad:   0.9 mmHg AV Vmean:          90.400 cm/s  PV Mean grad:   1.0 mmHg AV VTI:            0.252 m      RVOT Peak grad: 3 mmHg AV Peak Grad:      6.1 mmHg AV Mean Grad:      4.0 mmHg LVOT Vmax:         92.00 cm/s LVOT Vmean:        65.200 cm/s LVOT VTI:          0.225 m LVOT/AV VTI ratio: 0.89  AORTA Ao Root diam: 2.70 cm MITRAL VALVE               TRICUSPID VALVE MV Area (PHT): 2.90 cm    TR Peak grad:   21.7 mmHg MV Area VTI:   2.72 cm    TR Vmax:        233.00 cm/s MV Peak grad:  3.7 mmHg MV Mean grad:  1.0 mmHg    SHUNTS MV Vmax:       0.96 m/s    Systemic VTI:  0.22 m MV Vmean:      52.6 cm/s   Systemic Diam: 2.00 cm MV Decel Time: 262 msec    Pulmonic VTI:  0.168 m MV E velocity: 52.70 cm/s MV A velocity: 75.80 cm/s MV E/A ratio:  0.70 Serafina Royals MD Electronically signed by Serafina Royals MD Signature Date/Time: 03/27/2021/2:24:05 PM    Final       Subjective:   Discharge Exam: Vitals:   03/28/21 0500 03/28/21 0725  BP: 140/62 (!) 145/71  Pulse: 69 65  Resp: 16 16  Temp: 98.2 F (36.8 C) 97.9 F (36.6 C)  SpO2: 95% 94%   Vitals:   03/27/21 2113 03/27/21 2339 03/28/21 0500 03/28/21 0725  BP: (!) 156/69  136/61 140/62 (!) 145/71  Pulse: 60 70 69 65  Resp: 20 (!) 21 16 16   Temp: 98.2 F (36.8 C) 98 F (36.7 C) 98.2 F (36.8 C) 97.9 F (36.6 C)  TempSrc:  Oral Oral Oral  SpO2: 97% 96% 95% 94%  Weight:      Height:        General: Pt is alert, awake, not in acute distress Cardiovascular: RRR, S1/S2 +, no rubs, no gallops Respiratory: CTA bilaterally, no wheezing, no rhonchi Abdominal: Soft, NT, ND, bowel sounds + Extremities: no edema, no cyanosis    The results of significant diagnostics from this hospitalization (including imaging, microbiology, ancillary and laboratory) are listed below for reference.     Microbiology: Recent Results (from the past 240 hour(s))  Resp Panel by RT-PCR (Flu A&B, Covid) Nasopharyngeal Swab     Status: None   Collection Time: 03/26/21  7:48 PM   Specimen: Nasopharyngeal Swab; Nasopharyngeal(NP) swabs in vial transport medium  Result Value Ref Range Status   SARS Coronavirus 2 by RT PCR NEGATIVE NEGATIVE Final    Comment: (NOTE) SARS-CoV-2 target nucleic acids are NOT DETECTED.  The SARS-CoV-2 RNA is generally detectable in upper respiratory specimens during the acute phase of infection. The lowest concentration of SARS-CoV-2 viral  copies this assay can detect is 138 copies/mL. A negative result does not preclude SARS-Cov-2 infection and should not be used as the sole basis for treatment or other patient management decisions. A negative result may occur with  improper specimen collection/handling, submission of specimen other than nasopharyngeal swab, presence of viral mutation(s) within the areas targeted by this assay, and inadequate number of viral copies(<138 copies/mL). A negative result must be combined with clinical observations, patient history, and epidemiological information. The expected result is Negative.  Fact Sheet for Patients:  EntrepreneurPulse.com.au  Fact Sheet for Healthcare Providers:   IncredibleEmployment.be  This test is no t yet approved or cleared by the [place] FDA and  has been authorized for detection and/or diagnosis of SARS-CoV-2 by FDA under an Emergency Use Authorization (EUA). This EUA will remain  in effect (meaning this test can be used) for the duration of the COVID-19 declaration under Section 564(b)(1) of the Act, 21 U.S.C.section 360bbb-3(b)(1), unless the authorization is terminated  or revoked sooner.       Influenza A by PCR NEGATIVE NEGATIVE Final   Influenza B by PCR NEGATIVE NEGATIVE Final    Comment: (NOTE) The Xpert Xpress SARS-CoV-2/FLU/RSV plus assay is intended as an aid in the diagnosis of influenza from Nasopharyngeal swab specimens and should not be used as a sole basis for treatment. Nasal washings and aspirates are unacceptable for Xpert Xpress SARS-CoV-2/FLU/RSV testing.  Fact Sheet for Patients: EntrepreneurPulse.com.au  Fact Sheet for Healthcare Providers: IncredibleEmployment.be  This test is not yet approved or cleared by the [place] FDA and has been authorized for detection and/or diagnosis of SARS-CoV-2 by FDA under an Emergency Use Authorization (EUA). This EUA will remain in effect (meaning this test can be used) for the duration of the COVID-19 declaration under Section 564(b)(1) of the Act, 21 U.S.C. section 360bbb-3(b)(1), unless the authorization is terminated or revoked.  Performed at [facility], [address] [place], [place] [address]      Labs: BNP (last 3 results) No results for input(s): BNP in the last 8760 hours. Basic Metabolic Panel: Recent Labs  Lab 03/26/21 1226 03/26/21 2149  NA 135 133*  K 3.6 3.8  CL 102 100  CO2 25 27  GLUCOSE 116* 126*  BUN 13 10  CREATININE 0.70 0.55  CALCIUM 9.2 8.9  PHOS  --  2.7   Liver Function Tests: Recent Labs  Lab 03/26/21 1226 03/26/21 2149  AST 19 20  ALT 8 9   ALKPHOS 57 51  BILITOT 0.8 0.9  PROT 7.0 6.7  ALBUMIN 3.9 4.0   No results for input(s): LIPASE, AMYLASE in the last 168 hours. No results for input(s): AMMONIA in the last 168 hours. CBC: Recent Labs  Lab 03/26/21 1226 03/27/21 0620  WBC 6.6 6.6  NEUTROABS 4.0  --   HGB 16.5* 14.7  HCT 48.9* 44.1  MCV 89.9 90.7  PLT 534* 470*   Cardiac Enzymes: No results for input(s): CKTOTAL, CKMB, CKMBINDEX, TROPONINI in the last 168 hours. BNP: Invalid input(s): POCBNP CBG: No results for input(s): GLUCAP in the last 168 hours. D-Dimer No results for input(s): DDIMER in the last 72 hours. Hgb A1c Recent Labs    03/27/21 0620  HGBA1C 5.0   Lipid Profile Recent Labs    03/27/21 0620  CHOL 150  HDL 69  LDLCALC 73  TRIG 41  CHOLHDL 2.2   Thyroid function studies Recent Labs    03/26/21 2149  TSH 2.114  Anemia work up No results for input(s): VITAMINB12, FOLATE, FERRITIN, TIBC, IRON, RETICCTPCT in the last 72 hours. Urinalysis    Component Value Date/Time   COLORURINE STRAW (A) 10/26/2020 0620   APPEARANCEUR CLEAR (A) 10/26/2020 0620   APPEARANCEUR Cloudy (A) 12/20/2016 1304   LABSPEC 1.015 10/26/2020 0620   PHURINE 8.0 10/26/2020 0620   GLUCOSEU NEGATIVE 10/26/2020 0620   HGBUR NEGATIVE 10/26/2020 0620   BILIRUBINUR NEGATIVE 10/26/2020 0620   BILIRUBINUR Negative 12/20/2016 1304   KETONESUR 5 (A) 10/26/2020 0620   PROTEINUR NEGATIVE 10/26/2020 0620   NITRITE NEGATIVE 10/26/2020 0620   LEUKOCYTESUR NEGATIVE 10/26/2020 0620   Sepsis Labs Invalid input(s): PROCALCITONIN,  WBC,  LACTICIDVEN Microbiology Recent Results (from the past 240 hour(s))  Resp Panel by RT-PCR (Flu A&B, Covid) Nasopharyngeal Swab     Status: None   Collection Time: 03/26/21  7:48 PM   Specimen: Nasopharyngeal Swab; Nasopharyngeal(NP) swabs in vial transport medium  Result Value Ref Range Status   SARS Coronavirus 2 by RT PCR NEGATIVE NEGATIVE Final    Comment: (NOTE) SARS-CoV-2  target nucleic acids are NOT DETECTED.  The SARS-CoV-2 RNA is generally detectable in upper respiratory specimens during the acute phase of infection. The lowest concentration of SARS-CoV-2 viral copies this assay can detect is 138 copies/mL. A negative result does not preclude SARS-Cov-2 infection and should not be used as the sole basis for treatment or other patient management decisions. A negative result may occur with  improper specimen collection/handling, submission of specimen other than nasopharyngeal swab, presence of viral mutation(s) within the areas targeted by this assay, and inadequate number of viral copies(<138 copies/mL). A negative result must be combined with clinical observations, patient history, and epidemiological information. The expected result is Negative.  Fact Sheet for Patients:  EntrepreneurPulse.com.au  Fact Sheet for Healthcare Providers:  IncredibleEmployment.be  This test is no t yet approved or cleared by the [place] FDA and  has been authorized for detection and/or diagnosis of SARS-CoV-2 by FDA under an Emergency Use Authorization (EUA). This EUA will remain  in effect (meaning this test can be used) for the duration of the COVID-19 declaration under Section 564(b)(1) of the Act, 21 U.S.C.section 360bbb-3(b)(1), unless the authorization is terminated  or revoked sooner.       Influenza A by PCR NEGATIVE NEGATIVE Final   Influenza B by PCR NEGATIVE NEGATIVE Final    Comment: (NOTE) The Xpert Xpress SARS-CoV-2/FLU/RSV plus assay is intended as an aid in the diagnosis of influenza from Nasopharyngeal swab specimens and should not be used as a sole basis for treatment. Nasal washings and aspirates are unacceptable for Xpert Xpress SARS-CoV-2/FLU/RSV testing.  Fact Sheet for Patients: EntrepreneurPulse.com.au  Fact Sheet for Healthcare  Providers: IncredibleEmployment.be  This test is not yet approved or cleared by the [place] FDA and has been authorized for detection and/or diagnosis of SARS-CoV-2 by FDA under an Emergency Use Authorization (EUA). This EUA will remain in effect (meaning this test can be used) for the duration of the COVID-19 declaration under Section 564(b)(1) of the Act, 21 U.S.C. section 360bbb-3(b)(1), unless the authorization is terminated or revoked.  Performed at [facility], [address], [place], [place] [address]      Time coordinating discharge: Over 30 minutes  SIGNED:   Nolberto Hanlon, MD  [facility] 03/28/2021, 11:45 AM Pager   If 7PM-7AM, please contact night-coverage [email or web address] Password [email or web address]

## 2021-03-28 NOTE — TOC Transition Note (Signed)
Transition of Care [facility]) - CM/SW Discharge Note   Patient Details  Name: ADEA GEISEL MRN: [id number] Date of Birth: [birth year]  Transition of Care [facility]) CM/SW Contact:  Izola Price, RN Phone Number: 03/28/2021, 12:46 PM   Clinical Narrative:  11/12: Patient has new [facility] PT rec and discharge orders for today. Spoke with daughter, Marchia Bond, at [phone] [facility]). No preference on [facility] agency and verbal permission given to seek agency. [facility] can take with OT/PT/RN. Patient has needed DME per daughter. Simmie Davies RN CM    Final next level of care: Home w Home Health Services Barriers to Discharge: Barriers Resolved   Patient Goals and CMS Choice     Choice offered to / list presented to : Adult Children  Discharge Placement                       Discharge Plan and Services   Discharge Planning Services: CM Consult            DME Arranged: N/A DME Agency: NA       HH Arranged: PT HH Agency: [place]        Social Determinants of Health (SDOH) Interventions     Readmission Risk Interventions No flowsheet data found.

## 2021-03-28 NOTE — Plan of Care (Signed)
Patient orientedx4, VSS. MRA Head/Neck completed overnight. Ambulated to bathroom with standby assistance and walker. No c/o pan or SOB. Fall/safety precautions in place, rounding performed, needs/concerns addressed during shift.   Patient's daughter, [name] (number in chart), requesting call from MD for updates regarding plan.  Problem: Education: Goal: Knowledge of General Education information will improve Description: Including pain rating scale, medication(s)/side effects and non-pharmacologic comfort measures Outcome: Progressing   Problem: Health Behavior/Discharge Planning: Goal: Ability to manage health-related needs will improve Outcome: Progressing   Problem: Clinical Measurements: Goal: Ability to maintain clinical measurements within normal limits will improve Outcome: Progressing Goal: Will remain free from infection Outcome: Progressing Goal: Diagnostic test results will improve Outcome: Progressing Goal: Respiratory complications will improve Outcome: Progressing Goal: Cardiovascular complication will be avoided Outcome: Progressing   Problem: Activity: Goal: Risk for activity intolerance will decrease Outcome: Progressing   Problem: Nutrition: Goal: Adequate nutrition will be maintained Outcome: Progressing   Problem: Coping: Goal: Level of anxiety will decrease Outcome: Progressing   Problem: Elimination: Goal: Will not experience complications related to bowel motility Outcome: Progressing Goal: Will not experience complications related to urinary retention Outcome: Progressing   Problem: Pain Managment: Goal: General experience of comfort will improve Outcome: Progressing   Problem: Safety: Goal: Ability to remain free from injury will improve Outcome: Progressing   Problem: Skin Integrity: Goal: Risk for impaired skin integrity will decrease Outcome: Progressing

## 2021-03-28 NOTE — TOC Initial Note (Signed)
Transition of Care [facility] [place]) - Initial/Assessment Note    Patient Details  Name: Barbara Vega MRN: [id number] Date of Birth: 04-11-1934  [facility]) CM/SW Contact:    Izola Price, RN Phone Number: 03/28/2021, 12:49 PM  Clinical Narrative: 11/12: Spoke with patient's daughter as PMH of dementia. Per daughter:  Confirmed PCP and Pharmacy as per EHR.  Daughter takes patient to appointments/transportation needs.  Patient lives alone but has paid sitters that also live nearby.  Daughter lives 30 min away.  PTA patient was able to blow leaves off yard. No issues obtaining/paying for medications.  Permission sought to obtain [facility] agency.    Daughter will transport patient from [facility] to home.  Simmie Davies RN CM                 McPherson,Lois T (Daughter)  [phone] (Mobile)   Expected Discharge Plan: Home w Home Health Services Barriers to Discharge: Barriers Resolved   Patient Goals and CMS Choice     Choice offered to / list presented to : Adult Children  Expected Discharge Plan and Services Expected Discharge Plan: [place]   Discharge Planning Services: CM Consult   Living arrangements for the past 2 months: Single Family Home Expected Discharge Date: 03/28/21               DME Arranged: N/A DME Agency: NA       HH Arranged: PT [place] Agency: [place]        Prior Living Arrangements/Services Living arrangements for the past 2 months: Single Family Home Lives with:: Self Patient language and need for interpreter reviewed:: No Do you feel safe going back to the place where you live?:  (Patient had dementia/Spoke with daughter.)      Need for Family Participation in Patient Care: Yes (Comment) Care giver support system in place?: Yes (comment) Current home services: [facility] Criminal Activity/Legal Involvement Pertinent to Current Situation/Hospitalization: No - Comment as needed  Activities of Daily Living Home  Assistive Devices/Equipment: None ADL Screening (condition at time of admission) Patient's cognitive ability adequate to safely complete daily activities?: Yes Is the patient deaf or have difficulty hearing?: Yes Does the patient have difficulty seeing, even when wearing glasses/contacts?: No Does the patient have difficulty concentrating, remembering, or making decisions?: No Patient able to express need for assistance with ADLs?: Yes Does the patient have difficulty dressing or bathing?: No Independently performs ADLs?: Yes (appropriate for developmental age) Does the patient have difficulty walking or climbing stairs?: No Weakness of Legs: None Weakness of Arms/Hands: None  Permission Sought/Granted Permission sought to share information with : Case Manager, Chartered certified accountant granted to share information with : Yes, Verbal Permission Granted (Per Marchia Bond, Daughter.)  Share Information with NAME: McPherson,Lois T (Daughter)  Permission granted to share info w AGENCY: [place] granted to share info w Relationship: Daughter  Permission granted to share info w Contact Information: [phone] (Mobile)  Emotional Assessment Appearance:: Appears stated age Attitude/Demeanor/Rapport:  (Forgetful/PMH Dementia) Affect (typically observed): Accepting Orientation: : Oriented to Self, Oriented to Place, Fluctuating Orientation (Suspected and/or reported Sundowners) Alcohol / Substance Use: Not Applicable Psych Involvement: No (comment)  Admission diagnosis:  Paresthesia [R20.2] CVA (cerebral vascular accident) ([place]) [I63.9] 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 [facility]) [I63.81] Patient Active Problem List   Diagnosis Date Noted   Acute CVA (cerebrovascular accident) ([place]) 03/26/2021   Paresthesia 03/26/2021  Alzheimer's dementia without behavioral disturbance ([place])    Hiatal hernia with  GERD 10/26/2020   Generalized weakness 06/15/2019   Pain due to onychomycosis of toenails of both feet 06/07/2019   S/P kyphoplasty 06/01/2019   DDD (degenerative disc disease), lumbar 06/16/2017   SI (sacroiliac) joint dysfunction 06/16/2017   Incarcerated left inguinal hernia    GERD (gastroesophageal reflux disease) 09/20/2016   Dysphagia, unspecified 08/09/2016   Papilloma of breast 04/03/2015   Osteoporosis, post-menopausal 03/04/2015   Cannot sleep 03/04/2015   Essential hypertension 03/04/2015   Adenomatous colon polyp 03/04/2015   PCP:  [name], MD Pharmacy:   [facility] [place], [place] [address] [place] [address] [place] [address] Phone: [phone] Fax: [phone]     Social Determinants of Health (SDOH) Interventions    Readmission Risk Interventions No flowsheet data found.

## 2021-03-28 NOTE — TOC Transition Note (Signed)
Transition of Care [facility]) - CM/SW Discharge Note   Patient Details  Name: Barbara Vega MRN: [id number] Date of Birth: 09-07-1933  Transition of Care [facility]) CM/SW Contact:  Izola Price, RN Phone Number: 03/28/2021, 12:57 PM   Clinical Narrative:  Confirmed PT/OT/RN [place] orders via Lajean Manes and informed daughter Marchia Bond. Yolanda Bonine will pick up patient to transport home now. Simmie Davies RN CM      Final next level of care: Home w Home Health Services Barriers to Discharge: Barriers Resolved   Patient Goals and CMS Choice     Choice offered to / list presented to : Adult Children  Discharge Placement                       Discharge Plan and Services   Discharge Planning Services: CM Consult            DME Arranged: N/A DME Agency: NA       HH Arranged: RN, PT, OT HH Agency: [place]        Social Determinants of Health (SDOH) Interventions     Readmission Risk Interventions No flowsheet data found.

## 2021-03-28 NOTE — Progress Notes (Signed)
Patient transported to MRI in stable condition at this time.

## 2021-05-31 ENCOUNTER — Other Ambulatory Visit: Payer: Self-pay

## 2021-05-31 ENCOUNTER — Emergency Department: Payer: Medicare Other

## 2021-05-31 DIAGNOSIS — Z7902 Long term (current) use of antithrombotics/antiplatelets: Secondary | ICD-10-CM

## 2021-05-31 DIAGNOSIS — M810 Age-related osteoporosis without current pathological fracture: Secondary | ICD-10-CM | POA: Diagnosis present

## 2021-05-31 DIAGNOSIS — Z885 Allergy status to narcotic agent status: Secondary | ICD-10-CM

## 2021-05-31 DIAGNOSIS — Z811 Family history of alcohol abuse and dependence: Secondary | ICD-10-CM

## 2021-05-31 DIAGNOSIS — Z8262 Family history of osteoporosis: Secondary | ICD-10-CM

## 2021-05-31 DIAGNOSIS — H3530 Unspecified macular degeneration: Secondary | ICD-10-CM | POA: Diagnosis present

## 2021-05-31 DIAGNOSIS — G4700 Insomnia, unspecified: Secondary | ICD-10-CM | POA: Diagnosis present

## 2021-05-31 DIAGNOSIS — K219 Gastro-esophageal reflux disease without esophagitis: Secondary | ICD-10-CM | POA: Diagnosis present

## 2021-05-31 DIAGNOSIS — I10 Essential (primary) hypertension: Secondary | ICD-10-CM | POA: Diagnosis present

## 2021-05-31 DIAGNOSIS — Z8249 Family history of ischemic heart disease and other diseases of the circulatory system: Secondary | ICD-10-CM

## 2021-05-31 DIAGNOSIS — Z7982 Long term (current) use of aspirin: Secondary | ICD-10-CM

## 2021-05-31 DIAGNOSIS — Z86010 Personal history of colonic polyps: Secondary | ICD-10-CM

## 2021-05-31 DIAGNOSIS — K5900 Constipation, unspecified: Secondary | ICD-10-CM | POA: Diagnosis present

## 2021-05-31 DIAGNOSIS — Z90710 Acquired absence of both cervix and uterus: Secondary | ICD-10-CM

## 2021-05-31 DIAGNOSIS — E871 Hypo-osmolality and hyponatremia: Secondary | ICD-10-CM | POA: Diagnosis not present

## 2021-05-31 DIAGNOSIS — Z20822 Contact with and (suspected) exposure to covid-19: Secondary | ICD-10-CM | POA: Diagnosis present

## 2021-05-31 DIAGNOSIS — M4802 Spinal stenosis, cervical region: Secondary | ICD-10-CM | POA: Diagnosis present

## 2021-05-31 DIAGNOSIS — R630 Anorexia: Secondary | ICD-10-CM | POA: Diagnosis present

## 2021-05-31 DIAGNOSIS — E860 Dehydration: Secondary | ICD-10-CM | POA: Diagnosis present

## 2021-05-31 DIAGNOSIS — G309 Alzheimer's disease, unspecified: Secondary | ICD-10-CM | POA: Diagnosis present

## 2021-05-31 DIAGNOSIS — F02818 Dementia in other diseases classified elsewhere, unspecified severity, with other behavioral disturbance: Secondary | ICD-10-CM | POA: Diagnosis present

## 2021-05-31 DIAGNOSIS — M5030 Other cervical disc degeneration, unspecified cervical region: Secondary | ICD-10-CM | POA: Diagnosis present

## 2021-05-31 DIAGNOSIS — Z66 Do not resuscitate: Secondary | ICD-10-CM | POA: Diagnosis present

## 2021-05-31 DIAGNOSIS — K449 Diaphragmatic hernia without obstruction or gangrene: Secondary | ICD-10-CM | POA: Diagnosis present

## 2021-05-31 DIAGNOSIS — Z8719 Personal history of other diseases of the digestive system: Secondary | ICD-10-CM

## 2021-05-31 DIAGNOSIS — Z79899 Other long term (current) drug therapy: Secondary | ICD-10-CM

## 2021-05-31 DIAGNOSIS — D649 Anemia, unspecified: Secondary | ICD-10-CM | POA: Diagnosis present

## 2021-05-31 DIAGNOSIS — I69354 Hemiplegia and hemiparesis following cerebral infarction affecting left non-dominant side: Secondary | ICD-10-CM

## 2021-05-31 DIAGNOSIS — R519 Headache, unspecified: Secondary | ICD-10-CM | POA: Diagnosis not present

## 2021-05-31 DIAGNOSIS — E785 Hyperlipidemia, unspecified: Secondary | ICD-10-CM | POA: Diagnosis present

## 2021-05-31 DIAGNOSIS — E876 Hypokalemia: Secondary | ICD-10-CM | POA: Diagnosis present

## 2021-05-31 LAB — COMPREHENSIVE METABOLIC PANEL
ALT: 11 U/L (ref 0–44)
AST: 29 U/L (ref 15–41)
Albumin: 4.3 g/dL (ref 3.5–5.0)
Alkaline Phosphatase: 64 U/L (ref 38–126)
Anion gap: 11 (ref 5–15)
BUN: 9 mg/dL (ref 8–23)
CO2: 20 mmol/L — ABNORMAL LOW (ref 22–32)
Calcium: 9.3 mg/dL (ref 8.9–10.3)
Chloride: 91 mmol/L — ABNORMAL LOW (ref 98–111)
Creatinine, Ser: 0.48 mg/dL (ref 0.44–1.00)
GFR, Estimated: >60 mL/min (ref >60)
Glucose, Bld: 152 mg/dL — ABNORMAL HIGH (ref 70–99)
Potassium: 3.7 mmol/L (ref 3.5–5.1)
Sodium: 122 mmol/L — ABNORMAL LOW (ref 135–145)
Total Bilirubin: 1.2 mg/dL (ref 0.3–1.2)
Total Protein: 7 g/dL (ref 6.5–8.1)

## 2021-05-31 LAB — TROPONIN I (HIGH SENSITIVITY): Troponin I (High Sensitivity): 7 ng/L (ref <18)

## 2021-05-31 LAB — RESP PANEL BY RT-PCR (FLU A&B, COVID) ARPGX2
Influenza A by PCR: NEGATIVE
Influenza B by PCR: NEGATIVE
SARS Coronavirus 2 by RT PCR: NEGATIVE

## 2021-05-31 LAB — D-DIMER, QUANTITATIVE: D-Dimer, Quant: <0.27 ug/mL-FEU (ref 0.00–0.50)

## 2021-05-31 MED ORDER — LIDOCAINE 5 % EX PTCH
1.0000 | MEDICATED_PATCH | CUTANEOUS | Status: DC
  Administered 2021-06-01 – 2021-06-03 (×4): 1 via TRANSDERMAL
  Filled 2021-05-31 (×5): qty 1

## 2021-05-31 MED ORDER — ACETAMINOPHEN 500 MG PO TABS
1000.0000 mg | ORAL_TABLET | Freq: Once | ORAL | Status: AC
  Administered 2021-06-01: 1000 mg via ORAL
  Filled 2021-05-31: qty 2

## 2021-05-31 MED ORDER — ONDANSETRON HCL 4 MG/2ML IJ SOLN
4.0000 mg | Freq: Once | INTRAMUSCULAR | Status: AC
  Administered 2021-06-01: 4 mg via INTRAVENOUS
  Filled 2021-05-31: qty 2

## 2021-05-31 NOTE — ED Triage Notes (Signed)
Pt arrives with son with c/o of neck pain that started yesterday. Pt endorses headache and nausea.

## 2021-05-31 NOTE — ED Provider Triage Note (Signed)
Emergency Medicine Provider Triage Evaluation Note  Barbara Vega , a 88 y.o. female  was evaluated in triage. Patient has history of acute ischemic vertebrobasilar artery thalamic stroke diagnosed on 03/26/2021.Marland Kitchen  Pt complains of neck pain and headache that started yesterday.  Patient has also been complaining of breathlessness at home.  Patient is accompanied by her son who states that they have given her Tylenol at home with little relief.  Patient denies chest pain or chest tightness.  She has had some nausea at home.  Review of Systems  Positive: Patient has headache, neck pain and shortness of breath.  Negative:   Physical Exam  BP (!) 162/82    Pulse 82    Temp (!) 97.5 F (36.4 C) (Oral)    Resp 20    SpO2 94%  Gen:   Awake, no distress   Resp:  Patient tachypneic. MSK:   Moves extremities without difficulty  Other:    Medical Decision Making  Medically screening exam initiated at 9:22 PM.  Appropriate orders placed.  Nichola Cieslinski Klawitter was informed that the remainder of the evaluation will be completed by another provider, this initial triage assessment does not replace that evaluation, and the importance of remaining in the ED until their evaluation is complete.     Vallarie Mare [place], [place] 05/31/21 2125

## 2021-05-31 NOTE — ED Notes (Signed)
Pt given a warm blanket 

## 2021-06-01 ENCOUNTER — Inpatient Hospital Stay
Admission: EM | Admit: 2021-06-01 | Discharge: 2021-06-04 | DRG: 641 | Disposition: A | Discharge status: Home / Self Care | Payer: Medicare Other | Attending: Student in an Organized Health Care Education/Training Program | Admitting: Student in an Organized Health Care Education/Training Program

## 2021-06-01 ENCOUNTER — Emergency Department: Payer: Medicare Other

## 2021-06-01 ENCOUNTER — Encounter: Payer: Self-pay | Admitting: Internal Medicine

## 2021-06-01 DIAGNOSIS — M4802 Spinal stenosis, cervical region: Secondary | ICD-10-CM | POA: Diagnosis present

## 2021-06-01 DIAGNOSIS — Z66 Do not resuscitate: Secondary | ICD-10-CM | POA: Diagnosis present

## 2021-06-01 DIAGNOSIS — R112 Nausea with vomiting, unspecified: Secondary | ICD-10-CM

## 2021-06-01 DIAGNOSIS — D649 Anemia, unspecified: Secondary | ICD-10-CM | POA: Diagnosis present

## 2021-06-01 DIAGNOSIS — E7849 Other hyperlipidemia: Secondary | ICD-10-CM | POA: Diagnosis not present

## 2021-06-01 DIAGNOSIS — E876 Hypokalemia: Secondary | ICD-10-CM | POA: Diagnosis not present

## 2021-06-01 DIAGNOSIS — Z8719 Personal history of other diseases of the digestive system: Secondary | ICD-10-CM | POA: Diagnosis not present

## 2021-06-01 DIAGNOSIS — Z20822 Contact with and (suspected) exposure to covid-19: Secondary | ICD-10-CM | POA: Diagnosis present

## 2021-06-01 DIAGNOSIS — F0280 Dementia in other diseases classified elsewhere without behavioral disturbance: Secondary | ICD-10-CM | POA: Diagnosis not present

## 2021-06-01 DIAGNOSIS — F02818 Dementia in other diseases classified elsewhere, unspecified severity, with other behavioral disturbance: Secondary | ICD-10-CM | POA: Diagnosis present

## 2021-06-01 DIAGNOSIS — G309 Alzheimer's disease, unspecified: Secondary | ICD-10-CM | POA: Diagnosis present

## 2021-06-01 DIAGNOSIS — K5900 Constipation, unspecified: Secondary | ICD-10-CM | POA: Diagnosis present

## 2021-06-01 DIAGNOSIS — I639 Cerebral infarction, unspecified: Secondary | ICD-10-CM

## 2021-06-01 DIAGNOSIS — M810 Age-related osteoporosis without current pathological fracture: Secondary | ICD-10-CM | POA: Diagnosis present

## 2021-06-01 DIAGNOSIS — I69354 Hemiplegia and hemiparesis following cerebral infarction affecting left non-dominant side: Secondary | ICD-10-CM | POA: Diagnosis not present

## 2021-06-01 DIAGNOSIS — M542 Cervicalgia: Secondary | ICD-10-CM | POA: Diagnosis not present

## 2021-06-01 DIAGNOSIS — Z86010 Personal history of colonic polyps: Secondary | ICD-10-CM | POA: Diagnosis not present

## 2021-06-01 DIAGNOSIS — Z8262 Family history of osteoporosis: Secondary | ICD-10-CM | POA: Diagnosis not present

## 2021-06-01 DIAGNOSIS — Z811 Family history of alcohol abuse and dependence: Secondary | ICD-10-CM | POA: Diagnosis not present

## 2021-06-01 DIAGNOSIS — E860 Dehydration: Secondary | ICD-10-CM | POA: Diagnosis present

## 2021-06-01 DIAGNOSIS — K449 Diaphragmatic hernia without obstruction or gangrene: Secondary | ICD-10-CM | POA: Diagnosis present

## 2021-06-01 DIAGNOSIS — R1084 Generalized abdominal pain: Secondary | ICD-10-CM | POA: Diagnosis not present

## 2021-06-01 DIAGNOSIS — E785 Hyperlipidemia, unspecified: Secondary | ICD-10-CM | POA: Diagnosis present

## 2021-06-01 DIAGNOSIS — M5030 Other cervical disc degeneration, unspecified cervical region: Secondary | ICD-10-CM | POA: Diagnosis present

## 2021-06-01 DIAGNOSIS — K219 Gastro-esophageal reflux disease without esophagitis: Secondary | ICD-10-CM | POA: Diagnosis present

## 2021-06-01 DIAGNOSIS — G4700 Insomnia, unspecified: Secondary | ICD-10-CM | POA: Diagnosis present

## 2021-06-01 DIAGNOSIS — I10 Essential (primary) hypertension: Secondary | ICD-10-CM

## 2021-06-01 DIAGNOSIS — Z8249 Family history of ischemic heart disease and other diseases of the circulatory system: Secondary | ICD-10-CM | POA: Diagnosis not present

## 2021-06-01 DIAGNOSIS — E871 Hypo-osmolality and hyponatremia: Secondary | ICD-10-CM | POA: Diagnosis present

## 2021-06-01 DIAGNOSIS — R519 Headache, unspecified: Secondary | ICD-10-CM | POA: Diagnosis present

## 2021-06-01 DIAGNOSIS — H3530 Unspecified macular degeneration: Secondary | ICD-10-CM | POA: Diagnosis present

## 2021-06-01 LAB — CBC WITH DIFFERENTIAL/PLATELET
Abs Immature Granulocytes: 0.04 10*3/uL (ref 0.00–0.07)
Basophils Absolute: 0.1 10*3/uL (ref 0.0–0.1)
Basophils Relative: 1 %
Eosinophils Absolute: 0 10*3/uL (ref 0.0–0.5)
Eosinophils Relative: 0 %
HCT: 50.5 % — ABNORMAL HIGH (ref 36.0–46.0)
Hemoglobin: 17.6 g/dL — ABNORMAL HIGH (ref 12.0–15.0)
Immature Granulocytes: 0 %
Lymphocytes Relative: 14 %
Lymphs Abs: 1.5 10*3/uL (ref 0.7–4.0)
MCH: 28.9 pg (ref 26.0–34.0)
MCHC: 34.9 g/dL (ref 30.0–36.0)
MCV: 82.9 fL (ref 80.0–100.0)
Monocytes Absolute: 0.8 10*3/uL (ref 0.1–1.0)
Monocytes Relative: 7 %
Neutro Abs: 8.1 10*3/uL — ABNORMAL HIGH (ref 1.7–7.7)
Neutrophils Relative %: 78 %
Platelets: 474 10*3/uL — ABNORMAL HIGH (ref 150–400)
RBC: 6.09 MIL/uL — ABNORMAL HIGH (ref 3.87–5.11)
RDW: 12 % (ref 11.5–15.5)
WBC: 10.5 10*3/uL (ref 4.0–10.5)
nRBC: 0 % (ref 0.0–0.2)

## 2021-06-01 LAB — URINALYSIS, COMPLETE (UACMP) WITH MICROSCOPIC
Bacteria, UA: NONE SEEN
Bilirubin Urine: NEGATIVE
Glucose, UA: NEGATIVE mg/dL
Ketones, ur: 40 mg/dL — AB
Leukocytes,Ua: NEGATIVE
Nitrite: NEGATIVE
Protein, ur: 30 mg/dL — AB
Specific Gravity, Urine: 1.015 (ref 1.005–1.030)
pH: 6.5 (ref 5.0–8.0)

## 2021-06-01 LAB — BASIC METABOLIC PANEL
Anion gap: 11 (ref 5–15)
Anion gap: 7 (ref 5–15)
BUN: 11 mg/dL (ref 8–23)
BUN: 9 mg/dL (ref 8–23)
CO2: 18 mmol/L — ABNORMAL LOW (ref 22–32)
CO2: 25 mmol/L (ref 22–32)
Calcium: 9 mg/dL (ref 8.9–10.3)
Calcium: 8.7 mg/dL — ABNORMAL LOW (ref 8.9–10.3)
Chloride: 92 mmol/L — ABNORMAL LOW (ref 98–111)
Chloride: 95 mmol/L — ABNORMAL LOW (ref 98–111)
Creatinine, Ser: 0.55 mg/dL (ref 0.44–1.00)
Creatinine, Ser: 0.52 mg/dL (ref 0.44–1.00)
GFR, Estimated: >60 mL/min (ref >60)
GFR, Estimated: >60 mL/min (ref >60)
Glucose, Bld: 120 mg/dL — ABNORMAL HIGH (ref 70–99)
Glucose, Bld: 97 mg/dL (ref 70–99)
Potassium: 3.7 mmol/L (ref 3.5–5.1)
Potassium: 3.3 mmol/L — ABNORMAL LOW (ref 3.5–5.1)
Sodium: 121 mmol/L — ABNORMAL LOW (ref 135–145)
Sodium: 127 mmol/L — ABNORMAL LOW (ref 135–145)

## 2021-06-01 LAB — OSMOLALITY, URINE: Osmolality, Ur: 588 mOsm/kg (ref 300–900)

## 2021-06-01 LAB — OSMOLALITY: Osmolality: 262 mOsm/kg — ABNORMAL LOW (ref 275–295)

## 2021-06-01 LAB — SODIUM, URINE, RANDOM: Sodium, Ur: 73 mmol/L

## 2021-06-01 LAB — LIPASE, BLOOD: Lipase: 29 U/L (ref 11–51)

## 2021-06-01 LAB — TROPONIN I (HIGH SENSITIVITY): Troponin I (High Sensitivity): 7 ng/L (ref <18)

## 2021-06-01 MED ORDER — HYDRALAZINE HCL 20 MG/ML IJ SOLN: 5.0000 mg | INTRAMUSCULAR | Status: DC | PRN

## 2021-06-01 MED ORDER — DIVALPROEX SODIUM 250 MG PO DR TAB
250.0000 mg | DELAYED_RELEASE_TABLET | Freq: Every day | ORAL | Status: DC
  Administered 2021-06-01 – 2021-06-03 (×3): 250 mg via ORAL
  Filled 2021-06-01 (×4): qty 1

## 2021-06-01 MED ORDER — SODIUM CHLORIDE 0.9 % IV BOLUS
500.0000 mL | Freq: Once | INTRAVENOUS | Status: AC
  Administered 2021-06-01: 500 mL via INTRAVENOUS

## 2021-06-01 MED ORDER — ONDANSETRON HCL 4 MG/2ML IJ SOLN
4.0000 mg | Freq: Three times a day (TID) | INTRAMUSCULAR | Status: DC | PRN
  Administered 2021-06-02 – 2021-06-03 (×2): 4 mg via INTRAVENOUS
  Filled 2021-06-01 (×2): qty 2

## 2021-06-01 MED ORDER — SODIUM CHLORIDE 0.9 % IV SOLN: INTRAVENOUS | Status: AC

## 2021-06-01 MED ORDER — SODIUM CHLORIDE 0.9 % IV BOLUS
250.0000 mL | Freq: Once | INTRAVENOUS | Status: AC
  Administered 2021-06-01: 250 mL via INTRAVENOUS

## 2021-06-01 MED ORDER — DIVALPROEX SODIUM 250 MG PO DR TAB: 250.0000 mg | DELAYED_RELEASE_TABLET | Freq: Two times a day (BID) | ORAL | Status: DC

## 2021-06-01 MED ORDER — IOHEXOL 300 MG/ML  SOLN
80.0000 mL | Freq: Once | INTRAMUSCULAR | Status: AC | PRN
  Administered 2021-06-01: 80 mL via INTRAVENOUS

## 2021-06-01 MED ORDER — TRIAMCINOLONE ACETONIDE 0.1 % EX CREA
TOPICAL_CREAM | Freq: Two times a day (BID) | CUTANEOUS | Status: DC
  Filled 2021-06-01 (×3): qty 15

## 2021-06-01 MED ORDER — ASPIRIN EC 81 MG PO TBEC
81.0000 mg | DELAYED_RELEASE_TABLET | Freq: Every day | ORAL | Status: DC
  Administered 2021-06-01 – 2021-06-04 (×4): 81 mg via ORAL
  Filled 2021-06-01 (×4): qty 1

## 2021-06-01 MED ORDER — DIVALPROEX SODIUM 125 MG PO DR TAB
375.0000 mg | DELAYED_RELEASE_TABLET | Freq: Every day | ORAL | Status: DC
  Administered 2021-06-01 – 2021-06-04 (×4): 375 mg via ORAL
  Filled 2021-06-01 (×4): qty 3

## 2021-06-01 MED ORDER — ATORVASTATIN CALCIUM 20 MG PO TABS
20.0000 mg | ORAL_TABLET | Freq: Every day | ORAL | Status: DC
Start: 2021-06-01 — End: 2021-06-04
  Administered 2021-06-01 – 2021-06-03 (×3): 20 mg via ORAL
  Filled 2021-06-01 (×4): qty 1

## 2021-06-01 MED ORDER — ENOXAPARIN SODIUM 40 MG/0.4ML IJ SOSY
40.0000 mg | PREFILLED_SYRINGE | INTRAMUSCULAR | Status: DC
  Administered 2021-06-01 – 2021-06-03 (×3): 40 mg via SUBCUTANEOUS
  Filled 2021-06-01 (×3): qty 0.4

## 2021-06-01 MED ORDER — AMLODIPINE BESYLATE 5 MG PO TABS
2.5000 mg | ORAL_TABLET | Freq: Every day | ORAL | Status: DC
  Administered 2021-06-01 – 2021-06-03 (×3): 2.5 mg via ORAL
  Filled 2021-06-01 (×3): qty 1

## 2021-06-01 MED ORDER — PANTOPRAZOLE SODIUM 40 MG PO TBEC
40.0000 mg | DELAYED_RELEASE_TABLET | Freq: Every day | ORAL | Status: DC
  Administered 2021-06-01 – 2021-06-03 (×3): 40 mg via ORAL
  Filled 2021-06-01 (×3): qty 1

## 2021-06-01 MED ORDER — SODIUM CHLORIDE 0.9 % IV SOLN: INTRAVENOUS | Status: DC

## 2021-06-01 MED ORDER — FENTANYL CITRATE PF 50 MCG/ML IJ SOSY
12.5000 ug | PREFILLED_SYRINGE | Freq: Once | INTRAMUSCULAR | Status: AC
  Administered 2021-06-01: 12.5 ug via INTRAVENOUS
  Filled 2021-06-01: qty 1

## 2021-06-01 MED ORDER — DONEPEZIL HCL 5 MG PO TABS
10.0000 mg | ORAL_TABLET | Freq: Every day | ORAL | Status: DC
Start: 2021-06-01 — End: 2021-06-04
  Administered 2021-06-01 – 2021-06-03 (×3): 10 mg via ORAL
  Filled 2021-06-01 (×4): qty 2

## 2021-06-01 MED ORDER — ACETAMINOPHEN 325 MG PO TABS
650.0000 mg | ORAL_TABLET | Freq: Four times a day (QID) | ORAL | Status: DC | PRN
  Administered 2021-06-01 – 2021-06-03 (×5): 650 mg via ORAL
  Filled 2021-06-01 (×6): qty 2

## 2021-06-01 MED ORDER — CLOPIDOGREL BISULFATE 75 MG PO TABS
75.0000 mg | ORAL_TABLET | Freq: Every day | ORAL | Status: DC
  Administered 2021-06-01 – 2021-06-03 (×3): 75 mg via ORAL
  Filled 2021-06-01 (×4): qty 1

## 2021-06-01 NOTE — H&P (Addendum)
History and Physical    Barbara Vega ZES:[id number] DOB: Sep 23, 1933 DOA: 06/01/2021  Referring MD/NP/PA:   PCP: [name], MD   Patient coming from:  The patient is coming from home.   Chief Complaint: Generalized weakness, poor appetite, decreased oral intake  HPI: Barbara Vega is a 88 y.o. female with medical history significant of hypertension, hyperlipidemia, stroke, GERD, Alzheimer's disease, macular degeneration, who presents with generalized weakness, poor appetite, decreased oral intake.  Patient is a very poor historian due to dementia.  I called her daughter by phone.  Per daughter, patient has not been feeling well recently.  She has generalized weakness, poor appetite and decreased oral intake.  Patient also has nausea and dry heaves.  No vomiting, diarrhea or abdominal pain.  No chest pain, cough, shortness of breath.  No fever or chills.  Did not complain symptoms of UTI.  ED Course: pt was found to have sodium 122, WBC 10.5, troponin level 7 --> 7, negative COVID PCR, D-dimer negative, renal function okay, temperature normal, blood pressure 158/86, heart rate 82, RR 38, 23, oxygen saturation 94% on room air.  Chest x-ray negative.  CT of head is negative for acute intracranial abnormalities.  CT of C-spine showed degenerative disc disease.  CT abdomen/pelvis negative for acute issues.  Patient is admitted to [facility] as inpatient.   EKG: I have personally reviewed.  Sinus rhythm, QTC 422, poor R wave progression, possible left atrial enlargement  Review of Systems:   General: no fevers, chills, no body weight gain, has poor appetite, has fatigue HEENT: no blurry vision, hearing changes or sore throat Respiratory: no dyspnea, coughing, wheezing CV: no chest pain, no palpitations GI: has nausea, dry heaves, no vomiting, abdominal pain, diarrhea, constipation GU: no dysuria, burning on urination, increased urinary frequency, hematuria  Ext: Has trace leg  edema Neuro: no unilateral weakness, numbness, or tingling, no vision change or hearing loss Skin: no rash, no skin tear. MSK: No muscle spasm, no deformity, no limitation of range of movement in spin Heme: No easy bruising.  Travel history: No recent long distant travel.   Allergy:  Allergies  Allergen Reactions   Codone [Hydrocodone] Nausea And Vomiting    Past Medical History:  Diagnosis Date   Adenomatous colon polyp    Anemia    hx   Arthritis    Benign breast lumps    History of colon polyps    benign   Hypertension    takes Triamterene-HCTZ daily   Insomnia    takes Restoril nightly   Macular degeneration    dry   Osteoporosis    Sinus infection    taking Ceftin daily     Past Surgical History:  Procedure Laterality Date   ABDOMINAL HYSTERECTOMY     BREAST EXCISIONAL BIOPSY     BREAST LUMPECTOMY Right 09/08/2015   intraductal papilloma   BREAST LUMPECTOMY Left    2 bx done only one scar seen years ago neg   cataract surgery Bilateral    COLONOSCOPY     COLONOSCOPY WITH PROPOFOL N/A 11/03/2016   Procedure: COLONOSCOPY WITH PROPOFOL;  Surgeon: Manya Silvas, MD;  Location: [facility] ENDOSCOPY;  Service: Endoscopy;  Laterality: N/A;   ESOPHAGOGASTRODUODENOSCOPY (EGD) WITH PROPOFOL N/A 11/03/2016   Procedure: ESOPHAGOGASTRODUODENOSCOPY (EGD) WITH PROPOFOL;  Surgeon: Manya Silvas, MD;  Location: [facility] ENDOSCOPY;  Service: Endoscopy;  Laterality: N/A;   INGUINAL HERNIA REPAIR Left 01/15/2017   Procedure: HERNIA REPAIR INGUINAL ADULT;  Surgeon: Florene Glen, MD;  Location: [facility] ORS;  Service: General;  Laterality: Left;   KYPHOPLASTY N/A 05/15/2019   Procedure: KYPHOPLASTY T12;  Surgeon: Hessie Knows, MD;  Location: [facility] ORS;  Service: Orthopedics;  Laterality: N/A;   KYPHOPLASTY N/A 06/01/2019   Procedure: T11 KYPHOPLASTY;  Surgeon: Hessie Knows, MD;  Location: [facility] ORS;  Service: Orthopedics;  Laterality: N/A;   RADIOACTIVE SEED GUIDED EXCISIONAL BREAST  BIOPSY Right 09/08/2015   Procedure: RADIOACTIVE SEED GUIDED EXCISIONAL BREAST BIOPSY;  Surgeon: Rolm Bookbinder, MD;  Location: [place];  Service: General;  Laterality: Right;    Social History:  reports that she has never smoked. She has never used smokeless tobacco. She reports that she does not drink alcohol and does not use drugs.  Family History:  Family History  Problem Relation Age of Onset   Osteoporosis Mother    Heart failure Mother    Heart block Brother    Alcohol abuse Brother    Bladder Cancer Neg Hx    Kidney cancer Neg Hx      Prior to Admission medications   Medication Sig Start Date End Date Taking? Authorizing Provider  atorvastatin (LIPITOR) 20 MG tablet Take 1 tablet by mouth daily. 04/02/21  Yes [provider]  divalproex (DEPAKOTE) 125 MG DR tablet TAKE 3 TABLETS BY MOUTH IN THE MORNING AND 2 TABLETS AT NIGHT 03/17/21  Yes [provider]  doxycycline (VIBRAMYCIN) 100 MG capsule Take 1 capsule by mouth 2 (two) times daily. 05/28/21 06/04/21 Yes [provider]  amLODipine (NORVASC) 2.5 MG tablet Take 2.5 mg by mouth daily.    [provider]  aspirin EC 81 MG EC tablet Take 1 tablet (81 mg total) by mouth daily. Swallow whole. 03/29/21 06/27/21  Nolberto Hanlon, MD  atorvastatin (LIPITOR) 80 MG tablet Take 1 tablet (80 mg total) by mouth daily. 03/29/21 04/28/21  Nolberto Hanlon, MD  clopidogrel (PLAVIX) 75 MG tablet Take 1 tablet (75 mg total) by mouth daily. 03/29/21 06/27/21  Nolberto Hanlon, MD  divalproex (DEPAKOTE) 125 MG DR tablet Take 250-375 mg by mouth 2 (two) times daily. 03/17/21   [provider]  donepezil (ARICEPT) 10 MG tablet Take 10 mg by mouth daily. 04/02/21   [provider]  donepezil (ARICEPT) 5 MG tablet Take 5 mg by mouth at bedtime. Patient not taking: Reported on 06/01/2021    [provider]  Multiple Vitamins-Minerals (PRESERVISION AREDS 2+MULTI VIT) CAPS Take 1 capsule by mouth at  bedtime. Patient not taking: No sig reported    [provider]  pantoprazole (PROTONIX) 40 MG tablet Take 1 tablet (40 mg total) by mouth daily. 10/28/20 11/27/20  Loletha Grayer, MD  triamcinolone cream (KENALOG) 0.1 % Apply topically 2 (two) times daily. 05/28/21   [provider]    Physical Exam: Vitals:   06/01/21 0515 06/01/21 0530 06/01/21 0720 06/01/21 0800  BP: (!) 142/78 (!) 148/80 (!) 158/86 (!) 148/79  Pulse: 64 66 68 73  Resp: (!) 22 (!) 24 (!) 23 (!) 29  Temp:      TempSrc:      SpO2: 95% 97% 94% 96%   General: Not in acute distress.  Dry mucous membrane HEENT:       Eyes: PERRL, EOMI, no scleral icterus.       ENT: No discharge from the ears and nose, no pharynx injection, no tonsillar enlargement.        Neck: No JVD, no bruit, no mass felt. Heme:  No neck lymph node enlargement. Cardiac: S1/S2, RRR, No murmurs, No gallops or rubs. Respiratory: No rales, wheezing, rhonchi or rubs. GI: Soft, nondistended, nontender, no rebound pain, no organomegaly, BS present. GU: No hematuria Ext: No pitting leg edema bilaterally. 1+DP/PT pulse bilaterally. Musculoskeletal: No joint deformities, No joint redness or warmth, no limitation of ROM in spin. Skin: No rashes.  Neuro: Alert, oriented X3, cranial nerves II-XII grossly intact, moves all extremities . Psych: Patient is not psychotic, no suicidal or hemocidal ideation.  Labs on Admission: I have personally reviewed following labs and imaging studies  CBC: Recent Labs  Lab 06/01/21 0338  WBC 10.5  NEUTROABS 8.1*  HGB 17.6*  HCT 50.5*  MCV 82.9  PLT 756*   Basic Metabolic Panel: Recent Labs  Lab 05/31/21 2120  NA 122*  K 3.7  CL 91*  CO2 20*  GLUCOSE 152*  BUN 9  CREATININE 0.48  CALCIUM 9.3   GFR: CrCl cannot be calculated (Unknown ideal weight.). Liver Function Tests: Recent Labs  Lab 05/31/21 2120  AST 29  ALT 11  ALKPHOS 64  BILITOT 1.2  PROT 7.0  ALBUMIN 4.3   No results  for input(s): LIPASE, AMYLASE in the last 168 hours. No results for input(s): AMMONIA in the last 168 hours. Coagulation Profile: No results for input(s): INR, PROTIME in the last 168 hours. Cardiac Enzymes: No results for input(s): CKTOTAL, CKMB, CKMBINDEX, TROPONINI in the last 168 hours. BNP (last 3 results) No results for input(s): PROBNP in the last 8760 hours. HbA1C: No results for input(s): HGBA1C in the last 72 hours. CBG: No results for input(s): GLUCAP in the last 168 hours. Lipid Profile: No results for input(s): CHOL, HDL, LDLCALC, TRIG, CHOLHDL, LDLDIRECT in the last 72 hours. Thyroid Function Tests: No results for input(s): TSH, T4TOTAL, FREET4, T3FREE, THYROIDAB in the last 72 hours. Anemia Panel: No results for input(s): VITAMINB12, FOLATE, FERRITIN, TIBC, IRON, RETICCTPCT in the last 72 hours. Urine analysis:    Component Value Date/Time   COLORURINE YELLOW 06/01/2021 0517   APPEARANCEUR CLEAR 06/01/2021 0517   APPEARANCEUR Cloudy (A) 12/20/2016 1304   LABSPEC 1.015 06/01/2021 0517   PHURINE 6.5 06/01/2021 0517   GLUCOSEU NEGATIVE 06/01/2021 0517   HGBUR TRACE (A) 06/01/2021 0517   BILIRUBINUR NEGATIVE 06/01/2021 0517   BILIRUBINUR Negative 12/20/2016 1304   KETONESUR 40 (A) 06/01/2021 0517   PROTEINUR 30 (A) 06/01/2021 0517   NITRITE NEGATIVE 06/01/2021 0517   LEUKOCYTESUR NEGATIVE 06/01/2021 0517   Sepsis Labs: @LABRCNTIP (procalcitonin:4,lacticidven:4) ) Recent Results (from the past 240 hour(s))  Resp Panel by RT-PCR (Flu A&B, Covid) Nasopharyngeal Swab     Status: None   Collection Time: 05/31/21  9:20 PM   Specimen: Nasopharyngeal Swab; Nasopharyngeal(NP) swabs in vial transport medium  Result Value Ref Range Status   SARS Coronavirus 2 by RT PCR NEGATIVE NEGATIVE Final    Comment: (NOTE) SARS-CoV-2 target nucleic acids are NOT DETECTED.  The SARS-CoV-2 RNA is generally detectable in upper respiratory specimens during the acute phase of infection.  The lowest concentration of SARS-CoV-2 viral copies this assay can detect is 138 copies/mL. A negative result does not preclude SARS-Cov-2 infection and should not be used as the sole basis for treatment or other patient management decisions. A negative result may occur with  improper specimen collection/handling, submission of specimen other than nasopharyngeal swab, presence of viral mutation(s) within the areas targeted by this assay, and inadequate number of viral copies(<138 copies/mL). A negative result must be combined  with clinical observations, patient history, and epidemiological information. The expected result is Negative.  Fact Sheet for Patients:  EntrepreneurPulse.com.au  Fact Sheet for Healthcare Providers:  IncredibleEmployment.be  This test is no t yet approved or cleared by the [place] FDA and  has been authorized for detection and/or diagnosis of SARS-CoV-2 by FDA under an Emergency Use Authorization (EUA). This EUA will remain  in effect (meaning this test can be used) for the duration of the COVID-19 declaration under Section 564(b)(1) of the Act, 21 U.S.C.section 360bbb-3(b)(1), unless the authorization is terminated  or revoked sooner.       Influenza A by PCR NEGATIVE NEGATIVE Final   Influenza B by PCR NEGATIVE NEGATIVE Final    Comment: (NOTE) The Xpert Xpress SARS-CoV-2/FLU/RSV plus assay is intended as an aid in the diagnosis of influenza from Nasopharyngeal swab specimens and should not be used as a sole basis for treatment. Nasal washings and aspirates are unacceptable for Xpert Xpress SARS-CoV-2/FLU/RSV testing.  Fact Sheet for Patients: EntrepreneurPulse.com.au  Fact Sheet for Healthcare Providers: IncredibleEmployment.be  This test is not yet approved or cleared by the [place] FDA and has been authorized for detection and/or diagnosis of SARS-CoV-2 by FDA  under an Emergency Use Authorization (EUA). This EUA will remain in effect (meaning this test can be used) for the duration of the COVID-19 declaration under Section 564(b)(1) of the Act, 21 U.S.C. section 360bbb-3(b)(1), unless the authorization is terminated or revoked.  Performed at [facility], [address] [place], [place] [address]      Radiological Exams on Admission: DG Chest 2 View  Result Date: 05/31/2021 CLINICAL DATA:  SOB EXAM: CHEST - 2 VIEW COMPARISON:  CT chest 07/29/2010, chest x-ray 10/26/2020 FINDINGS: The heart and mediastinal contours are unchanged. Aortic calcification. Low lung volumes. No focal consolidation. Chronic coarsened interstitial markings with no overt pulmonary edema. No pleural effusion. No pneumothorax. No acute osseous abnormality. Kyphoplasty of couple of lower thoracic vertebral bodies. IMPRESSION: Low lung volumes with no active cardiopulmonary disease. Electronically Signed   By: Iven Finn M.D.   On: 05/31/2021 22:24   CT Head Wo Contrast  Addendum Date: 06/01/2021   ADDENDUM REPORT: 06/01/2021 01:59 ADDENDUM: Not mentioned above, there is a hypodense 2.3 cm mass of the right lobe of the thyroid gland measuring 2.3 cm, 2.0 cm in size on soft tissue neck CT from 11/15/2017. This subsequently underwent FNA biopsy 12/08/2017 with unknown results. Apparent size increase is somewhat concerning and repeat thyroid ultrasound is recommended as well as correlation with the prior biopsy results. Electronically Signed   By: Telford Nab M.D.   On: 06/01/2021 01:59   Result Date: 06/01/2021 CLINICAL DATA:  Neck pain and headache. EXAM: CT HEAD WITHOUT CONTRAST CT CERVICAL SPINE WITHOUT CONTRAST TECHNIQUE: Multidetector CT imaging of the head and cervical spine was performed following the standard protocol without intravenous contrast. Multiplanar CT image reconstructions of the cervical spine were also generated. RADIATION DOSE REDUCTION: This  exam was performed according to the departmental dose-optimization program which includes automated exposure control, adjustment of the mA and/or kV according to patient size and/or use of iterative reconstruction technique. COMPARISON:  No prior cervical spine imaging for comparison. Comparison is made with the head CT of 03/26/2021. MR brain also 03/26/2021 FINDINGS: CT HEAD FINDINGS Brain: Left thalamocapsular lacunar infarcts noted on the prior study in the acute phase now have a chronic appearance. There is moderately advanced cerebral atrophy, small vessel disease and atrophic ventriculomegaly  mild cerebellar atrophy. No asymmetry is seen concerning for acute cortical based infarct, hemorrhage or midline shift, mass or mass effect. Vascular: There are calcifications in the distal vertebral arteries and siphons but no hyperdense central vasculature. Skull: The calvarium, skull base and orbits are intact. There is osteopenia. No focal bone lesion. Sinuses/Orbits: No acute finding.  Old lens extractions. Other: None. CT CERVICAL SPINE FINDINGS Alignment: Reversed cervical lordosis is noted. Trace discogenic C5-6 retrolisthesis without further listhesis. Slight cervical dextroscoliosis. Skull base and vertebrae: Osteopenia. No fracture or focal bone lesion is seen. Soft tissues and spinal canal: No prevertebral fluid or swelling. No visible canal hematoma. Disc levels: There is a disc collapse with chronic appearance at C5-6. Mild disc space loss C4-5 and C6-7. Other cervical discs are normal in heights. At C5-6 and C6-7 there are small bidirectional osteophytes mildly encroaching on the ventral thecal sac, but not causing cord compression. Other levels do not show evidence of herniated discs or other significant soft tissue or bony encroachment on the thecal sac. Uncinate joint and facet spurs are seen at most levels and are greater on the left, with foraminal stenosis which is mild on the left at C2-3, moderate  to severe on the left C3-4, bilaterally mild C5-6. Other foramina are clear. Upper chest: Negative. Other: None. IMPRESSION: 1. No acute intracranial CT findings. Lacunar infarcts in the left thalamocapsular area have a chronic appearance, previously acute. 2. Atrophy and small vessel disease. 3. Osteopenia and degenerative change cervical spine without evidence of fracture. 4. Slight cervical kyphodextroscoliosis with C5-6 discogenic retrolisthesis. 5. Degenerative foraminal stenosis as above. No cord compromise or herniated discs. Electronically Signed: By: Telford Nab M.D. On: 05/31/2021 23:47   CT CERVICAL SPINE WO CONTRAST  Addendum Date: 06/01/2021   ADDENDUM REPORT: 06/01/2021 01:59 ADDENDUM: Not mentioned above, there is a hypodense 2.3 cm mass of the right lobe of the thyroid gland measuring 2.3 cm, 2.0 cm in size on soft tissue neck CT from 11/15/2017. This subsequently underwent FNA biopsy 12/08/2017 with unknown results. Apparent size increase is somewhat concerning and repeat thyroid ultrasound is recommended as well as correlation with the prior biopsy results. Electronically Signed   By: Telford Nab M.D.   On: 06/01/2021 01:59   Result Date: 06/01/2021 CLINICAL DATA:  Neck pain and headache. EXAM: CT HEAD WITHOUT CONTRAST CT CERVICAL SPINE WITHOUT CONTRAST TECHNIQUE: Multidetector CT imaging of the head and cervical spine was performed following the standard protocol without intravenous contrast. Multiplanar CT image reconstructions of the cervical spine were also generated. RADIATION DOSE REDUCTION: This exam was performed according to the departmental dose-optimization program which includes automated exposure control, adjustment of the mA and/or kV according to patient size and/or use of iterative reconstruction technique. COMPARISON:  No prior cervical spine imaging for comparison. Comparison is made with the head CT of 03/26/2021. MR brain also 03/26/2021 FINDINGS: CT HEAD FINDINGS  Brain: Left thalamocapsular lacunar infarcts noted on the prior study in the acute phase now have a chronic appearance. There is moderately advanced cerebral atrophy, small vessel disease and atrophic ventriculomegaly mild cerebellar atrophy. No asymmetry is seen concerning for acute cortical based infarct, hemorrhage or midline shift, mass or mass effect. Vascular: There are calcifications in the distal vertebral arteries and siphons but no hyperdense central vasculature. Skull: The calvarium, skull base and orbits are intact. There is osteopenia. No focal bone lesion. Sinuses/Orbits: No acute finding.  Old lens extractions. Other: None. CT CERVICAL SPINE FINDINGS Alignment: Reversed cervical  lordosis is noted. Trace discogenic C5-6 retrolisthesis without further listhesis. Slight cervical dextroscoliosis. Skull base and vertebrae: Osteopenia. No fracture or focal bone lesion is seen. Soft tissues and spinal canal: No prevertebral fluid or swelling. No visible canal hematoma. Disc levels: There is a disc collapse with chronic appearance at C5-6. Mild disc space loss C4-5 and C6-7. Other cervical discs are normal in heights. At C5-6 and C6-7 there are small bidirectional osteophytes mildly encroaching on the ventral thecal sac, but not causing cord compression. Other levels do not show evidence of herniated discs or other significant soft tissue or bony encroachment on the thecal sac. Uncinate joint and facet spurs are seen at most levels and are greater on the left, with foraminal stenosis which is mild on the left at C2-3, moderate to severe on the left C3-4, bilaterally mild C5-6. Other foramina are clear. Upper chest: Negative. Other: None. IMPRESSION: 1. No acute intracranial CT findings. Lacunar infarcts in the left thalamocapsular area have a chronic appearance, previously acute. 2. Atrophy and small vessel disease. 3. Osteopenia and degenerative change cervical spine without evidence of fracture. 4. Slight  cervical kyphodextroscoliosis with C5-6 discogenic retrolisthesis. 5. Degenerative foraminal stenosis as above. No cord compromise or herniated discs. Electronically Signed: By: Telford Nab M.D. On: 05/31/2021 23:47   CT ABDOMEN PELVIS W CONTRAST  Result Date: 06/01/2021 CLINICAL DATA:  Acute and nonlocalized abdominal pain EXAM: CT ABDOMEN AND PELVIS WITH CONTRAST TECHNIQUE: Multidetector CT imaging of the abdomen and pelvis was performed using the standard protocol following bolus administration of intravenous contrast. RADIATION DOSE REDUCTION: This exam was performed according to the departmental dose-optimization program which includes automated exposure control, adjustment of the mA and/or kV according to patient size and/or use of iterative reconstruction technique. CONTRAST:  14mL OMNIPAQUE IOHEXOL 300 MG/ML  SOLN COMPARISON:  10/26/2020 FINDINGS: Lower chest:  No acute finding.  Moderate sliding hiatal hernia. Hepatobiliary: Small cystic densities in the left lobe liver.No evidence of biliary obstruction or stone. Pancreas: 1 cm simple cystic density in the pancreatic neck, stable when compared to 2019 and non worrisome especially for patient age. Spleen: Unremarkable. Adrenals/Urinary Tract: 2 cm left adrenal nodule which is stable over multiple years and attributed to adenoma. No hydronephrosis or stone. Unremarkable bladder. Stomach/Bowel: No obstruction. Left colonic diverticulosis. No evidence of bowel inflammation Vascular/Lymphatic: No acute vascular abnormality. Atheromatous calcification that is expected for age. No mass or adenopathy. Reproductive:Hysterectomy. Other: No ascites or pneumoperitoneum. Musculoskeletal: No acute abnormalities. Advanced lumbar spine degeneration with scoliosis. Remote T11 and T12 compression fractures with cement augmentation. IMPRESSION: 1. Stable compared to June 2022. No acute finding or specific cause for pain. 2. Chronic findings are described above.  Electronically Signed   By: Jorje Guild M.D.   On: 06/01/2021 05:37      Assessment/Plan Principal Problem:   Hyponatremia Active Problems:   Essential hypertension   Alzheimer's dementia without behavioral disturbance (HCC)   HLD (hyperlipidemia)   Stroke (HCC)   Neck pain   Hyponatremia: Most likely due to poor oral intake and dehydration. - will admit to med-surg bed as inpt - Will check urine sodium, urine osmolality, serum osmolality. - Fluid restriction - IVF: 500 and 250 ml of NS bolus, will continue with IV normal saline at 75 mL/h - f/u by BMP q8h - avoid over correction too fast due to risk of central pontine myelinolysis  Essential hypertension -IV hydralazine as needed -Amlodipine  Alzheimer's dementia without behavioral disturbance (HCC) -Donepezil -Depakote  HLD (hyperlipidemia) -Lipitor  Stroke (HCC) -Lipitor and aspirin, Plavix  Neck pain: CT scan of C-spine showed degenerative disc disease. -As needed Tylenol -Lidoderm patch     DVT ppx: SQ Lovenox Code Status: DNR per her daughter Family Communication: Yes, patient's  daughter by phone Disposition Plan:  Anticipate discharge back to previous environment Consults called:  none Admission status and Level of care: Med-Surg:     as inpt      Status is: Inpatient  Remains inpatient appropriate because: Patient has multiple complaints, now presents with hyponatremia due to poor oral intake.  Her sodium is 122, which needs more than 2 days to be repleted in order to avoid over correction too fast due to risk of central pontine myelinolysis            Date of Service 06/01/2021    [facility]   If 7PM-7AM, please contact night-coverage [email or web address] 06/01/2021, 8:34 AM

## 2021-06-01 NOTE — ED Notes (Signed)
Assisted pt with bedpan

## 2021-06-01 NOTE — Evaluation (Signed)
Physical Therapy Evaluation Patient Details Name: Barbara Vega MRN: [id number] DOB: 1933-11-07 Today's Date: 06/01/2021  History of Present Illness  Pt is an 88 y.o. female with medical history significant of hypertension, hyperlipidemia, stroke, GERD, Alzheimer's disease, macular degeneration, who presents with generalized weakness, poor appetite, decreased oral intake. MD assessment includes: Hyponatremia, Essential hypertension, Alzheimer's dementia without behavioral disturbance, and neck pain, CT scan of C-spine showed degenerative disc disease.   Clinical Impression  Pt with history of dementia but was able to follow 1-step commands consistently with only minimal cuing.  Pt was Ind with all functional tasks and demonstrated good control and stability with transfers and gait.  Pt was steady using a RW initially for gait assessment but progressed to amb without an AD again with very good stability including during rapid 180 deg turns.  No skilled PT needs identified at this time.  Will complete PT orders at this time but will reassess pt pending a change in status upon receipt of new PT orders.        Recommendations for follow up therapy are one component of a multi-disciplinary discharge planning process, led by the attending physician.  Recommendations may be updated based on patient status, additional functional criteria and insurance authorization.  Follow Up Recommendations No PT follow up    Assistance Recommended at Discharge Frequent or constant Supervision/Assistance  Patient can return home with the following  A little help with bathing/dressing/bathroom;Assistance with cooking/housework;Direct supervision/assist for medications management;Assist for transportation;Direct supervision/assist for financial management    Equipment Recommendations None recommended by PT  Recommendations for Other Services       Functional Status Assessment Patient has not had a recent decline in  their functional status     Precautions / Restrictions Precautions Precautions: Fall Restrictions Weight Bearing Restrictions: No      Mobility  Bed Mobility Overal bed mobility: Independent                  Transfers Overall transfer level: Independent                      Ambulation/Gait Ambulation/Gait assistance: Independent Gait Distance (Feet): 50 Feet Assistive device: None;Rolling walker (2 wheels)   Gait velocity: decreased     General Gait Details: Pt ambulated 100 feet with a RW and 50 feet without an AD with no rest break between and with good stability throughout including fast 180 turns without an AD  Stairs            Wheelchair Mobility    Modified Rankin (Stroke Patients Only)       Balance Overall balance assessment: No apparent balance deficits (not formally assessed)                                           Pertinent Vitals/Pain Pain Assessment: No/denies pain    Home Living Family/patient expects to be discharged to:: Private residence Living Arrangements: Alone Available Help at Discharge: Family;Available 24 hours/day;Personal care attendant Type of Home: House Home Access: Stairs to enter Entrance Stairs-Rails: None Entrance Stairs-Number of Steps: 2   Home Layout: Two level;Able to live on main level with bedroom/bathroom Home Equipment: Rolling Walker (2 wheels);Cane - single point      Prior Function Prior Level of Function : Needs assist       Physical Assist : ADLs (physical)  ADLs (physical): Bathing Mobility Comments: Ind amb in the home without an AD, SPC in the community, community ambulator with no falls in the last year ADLs Comments: Ind with dressing, assist from PCA for bathing; family/PCA assists with meals, transportation, and meds     Hand Dominance   Dominant Hand: Right    Extremity/Trunk Assessment   Upper Extremity Assessment Upper Extremity Assessment:  Overall WFL for tasks assessed    Lower Extremity Assessment Lower Extremity Assessment: Overall WFL for tasks assessed       Communication   Communication: No difficulties  Cognition Arousal/Alertness: Awake/alert Behavior During Therapy: WFL for tasks assessed/performed Overall Cognitive Status: History of cognitive impairments - at baseline                                          General Comments      Exercises     Assessment/Plan    PT Assessment Patient does not need any further PT services  PT Problem List         PT Treatment Interventions      PT Goals (Current goals can be found in the Care Plan section)  Acute Rehab PT Goals PT Goal Formulation: All assessment and education complete, DC therapy    Frequency       Co-evaluation               AM-PAC PT "6 Clicks" Mobility  Outcome Measure Help needed turning from your back to your side while in a flat bed without using bedrails?: None Help needed moving from lying on your back to sitting on the side of a flat bed without using bedrails?: None Help needed moving to and from a bed to a chair (including a wheelchair)?: None Help needed standing up from a chair using your arms (e.g., wheelchair or bedside chair)?: None Help needed to walk in hospital room?: None Help needed climbing 3-5 steps with a railing? : None 6 Click Score: 24    End of Session Equipment Utilized During Treatment: Gait belt Activity Tolerance: Patient tolerated treatment well Patient left: in chair;with call bell/phone within reach;with chair alarm set;with family/visitor present Nurse Communication: Mobility status PT Visit Diagnosis: Muscle weakness (generalized) (M62.81)    Time: 1751-0258 PT Time Calculation (min) (ACUTE ONLY): 24 min   Charges:   PT Evaluation $PT Eval Low Complexity: 1 Low         D. Royetta Asal PT, DPT 06/01/21, 4:36 PM

## 2021-06-01 NOTE — Progress Notes (Signed)
OT Cancellation Note  Patient Details Name: Barbara Vega MRN: [id number] DOB: 1933-09-12   Cancelled Treatment:    Reason Eval/Treat Not Completed: OT screened, no needs identified, will sign off  Thank you for OT consult. Per PT report and chart review, pt is at her baseline level of functioning and has no acute OT needs.  Pt's daughter agreed with OT screen out, and verbalized understanding that OT can return at any point.  Will discharge pt from OT caseload, please re-consult if pt has change in functional status or additional OT-related needs arise.  Jeneen Montgomery, OTR/L 06/01/21, 4:02 PM

## 2021-06-01 NOTE — ED Provider Notes (Signed)
[facility] Provider Note    Event Date/Time   First MD Initiated Contact with Patient 06/01/21 0400     (approximate)   History   Neck Pain   HPI  Barbara Vega is a 88 y.o. female  with a past medical history significant for hypertension and macular degeneration and recent admission 11/10-11/12 last year for stroke some mild residual left weakness receiving Plavix history of peptic back in 2018 possibly secondary to infection vomiting presents accompanied by son for assessment of several complaints including some soreness in her neck as well as nonbloody vomiting, congestion and shortness of starting 2 days ago.  Patient states she is mostly constipated Small bowel movement.  She denies any cough, shortness of breath, fevers, chest pain, back pain, headache, earache, rash or extremity pain.  She has no new weakness numbness or tingling.  No other acute concerns at this time      Physical Exam  Triage Vital Signs: ED Triage Vitals  Enc Vitals Group     BP 05/31/21 2111 (!) 162/82     Pulse Rate 05/31/21 2111 82     Resp 05/31/21 2111 20     Temp 05/31/21 2111 (!) 97.5 F (36.4 C)     Temp Source 05/31/21 2111 Oral     SpO2 05/31/21 2111 94 %     Weight --      Height --      Head Circumference --      Peak Flow --      Pain Score 05/31/21 2112 10     Pain Loc --      Pain Edu? --      Excl. in [place]? --     Most recent vital signs: Vitals:   06/01/21 0515 06/01/21 0530  BP: (!) 142/78 (!) 148/80  Pulse: 64 66  Resp: (!) 22 (!) 24  Temp:    SpO2: 95% 97%    General: Awake, appears uncomfortable intermittently dry heaving throughout interview. CV:  Slightly prolonged capillary refill.  2+ radial pulses.  No murmurs rubs or gallops. Resp:  Normal effort.  Clear bilaterally. Abd:  No distention.  Soft throughout. Other:  Significant lower extremity edema.  Patient is awake and alert.   ED Results / Procedures / Treatments  Labs (all  labs ordered are listed, but only abnormal results are displayed) Labs Reviewed  COMPREHENSIVE METABOLIC PANEL - Abnormal; Notable for the following components:      Result Value   Sodium 122 (*)    Chloride 91 (*)    CO2 20 (*)    Glucose, Bld 152 (*)    All other components within normal limits  CBC WITH DIFFERENTIAL/PLATELET - Abnormal; Notable for the following components:   RBC 6.09 (*)    Hemoglobin 17.6 (*)    HCT 50.5 (*)    Platelets 474 (*)    Neutro Abs 8.1 (*)    All other components within normal limits  URINALYSIS, COMPLETE (UACMP) WITH MICROSCOPIC - Abnormal; Notable for the following components:   Hgb urine dipstick TRACE (*)    Ketones, ur 40 (*)    Protein, ur 30 (*)    All other components within normal limits  OSMOLALITY - Abnormal; Notable for the following components:   Osmolality 262 (*)    All other components within normal limits  RESP PANEL BY RT-PCR (FLU A&B, COVID) ARPGX2  D-DIMER, QUANTITATIVE  SODIUM, URINE, RANDOM  CBC WITH DIFFERENTIAL/PLATELET  TROPONIN I (HIGH SENSITIVITY)  TROPONIN I (HIGH SENSITIVITY)     EKG  EKG remarkable for sinus rhythm with ventricular rate of 83, nonspecific ST changes in anterior leads, unremarkable intervals, and severe right axis deviation.  No other appearance of acute ischemia or significant arrhythmia.   RADIOLOGY  CT head and C-spine reviewed by myself no evidence of intracranial hemorrhage, ischemia, mass-effect or acute C-spine process traumatic listhesis or mass.  As reviewed radiologist interpretation through the findings.  In addition to make note of prior infarct in the lef thalamic area, small vessel disease and parenchymal osteopenia degeneration C4.  There is also notation regarding hypodense mass in the right lobe of the thyroid gland.  He underwent results with his increased size.  Chest x-ray reviewed by myself remarkable for the edema, pneumothorax or acute thoracic process.  Also reviewed  radiologist interpretation and agree with their findings.  CT abdomen pelvis reviewed by myself shows stable pancreatic cyst and stable cystic densities in the liver with remote thoracic compression fractures but no evidence of an SBO, diverticulitis or other clear acute abdominal or pelvic process.  I also reviewed radiology interpretation and agree with their findings.   PROCEDURES:  Critical Care performed: No  .1-3 Lead EKG Interpretation Performed by: Lucrezia Starch, MD Authorized by: Lucrezia Starch, MD     Interpretation: normal     ECG rate assessment: normal     Rhythm: sinus rhythm     Ectopy: none     Conduction: normal    The patient is on the cardiac monitor to evaluate for evidence of arrhythmia and/or significant heart rate changes.   MEDICATIONS ORDERED IN ED: Medications  lidocaine (LIDODERM) 5 % 1 patch (1 patch Transdermal Patch Applied 06/01/21 0436)  0.9 %  sodium chloride infusion (has no administration in time range)  acetaminophen (TYLENOL) tablet 1,000 mg (1,000 mg Oral Given 06/01/21 0521)  ondansetron (ZOFRAN) injection 4 mg (4 mg Intravenous Given 06/01/21 0432)  sodium chloride 0.9 % bolus 500 mL (500 mLs Intravenous New Bag/Given 06/01/21 0432)  iohexol (OMNIPAQUE) 300 MG/ML solution 80 mL (80 mLs Intravenous Contrast Given 06/01/21 0449)     IMPRESSION / MDM / ASSESSMENT AND PLAN / ED COURSE  I reviewed the triage vital signs and the nursing notes.                              Differential diagnosis includes, but is not limited to nausea and vomiting related to an SBO, metabolic derangements, gastritis, UTI, ACS .  Patient is neck pain is much improved after some Tylenol and lidocaine and the skin is otherwise unremarkable and she is forage motion.  I suspect likely musculoskeletal etiology for this.  CT head and C-spine reviewed by myself no evidence of intracranial hemorrhage, ischemia, mass-effect or acute C-spine process traumatic listhesis or  mass.  As reviewed radiologist interpretation through the findings.  In addition to make note of prior infarct in the lef thalamic area, small vessel disease and parenchymal osteopenia degeneration C4.  There is also notation regarding hypodense mass in the right lobe of the thyroid gland.  He underwent results with his increased size.   Patient is neck pain is much improved after some Tylenol and lidocaine and the skin is otherwise unremarkable and she is forage motion.  I suspect likely musculoskeletal etiology for this.  Discussed importance of nonemergent follow-up for the patient's thyroid with patient  and son at bedside.  Chest x-ray reviewed by myself remarkable for the edema, pneumothorax or acute thoracic process.  Also reviewed radiologist interpretation and agree with their findings.  EKG remarkable for sinus rhythm with ventricular rate of 83, nonspecific ST changes in anterior leads, unremarkable intervals, and severe right axis deviation.  No other appearance of acute ischemia or significant arrhythmia.  Initial troponin 7 and I have low suspicion for ACS at this time.  Dimer is undetectable not suggestive of PE or acute thrombotic process.  CBC shows no leukocytosis or acute anemia.  CMP remarkable for hyponatremia with a sodium of 122 compared to 133 2 months ago.  Chloride is 91, bicarb is 29 significant electrolyte or metabolic derangements.  CT abdomen pelvis reviewed by myself shows stable pancreatic cyst and stable cystic densities in the liver with remote thoracic compression fractures but no evidence of an SBO, diverticulitis or other clear acute abdominal or pelvic process.  I also reviewed radiology interpretation and agree with their findings.  Urine has some ketones and protein but does not appear infected.  Patient started on gentle IV hydration and reassessment her nausea has been much improved and she was able tolerate little p.o.  I will admit to medicine service for  further evaluation and management of an acute hyponatremia.  I suspect this is likely from GI losses from nausea and vomiting.   FINAL CLINICAL IMPRESSION(S) / ED DIAGNOSES   Final diagnoses:  Hyponatremia  Nausea and vomiting, unspecified vomiting type  Neck pain  Generalized abdominal pain     Rx / DC Orders   ED Discharge Orders     None        Note:  This document was prepared using Dragon voice recognition software and may include unintentional dictation errors.   Lucrezia Starch, MD 06/01/21 [phone]

## 2021-06-01 NOTE — TOC Initial Note (Signed)
Transition of Care [facility]) - Initial/Assessment Note    Patient Details  Name: Barbara Vega MRN: [id number] Date of Birth: 12-May-1934  [facility]) CM/SW Contact:    Pete Pelt, RN Phone Number: 06/01/2021, 4:17 PM  Clinical Narrative:  Patient lives at home alone.  States she has assistance from her neighbor, who helps with her care and assists with medications.  Patient's daughter also helps her with care and transports her to appointments.  Patient does not have home health services, and does not feel it is required at this time.  She sees her PCP and declines TOC at this time.  TOC contact information provided, TOC will monitor for needs.                 Expected Discharge Plan: Home/Self Care (Patient declines needs at this time) Barriers to Discharge: Continued Medical Work up   Patient Goals and CMS Choice     Choice offered to / list presented to : NA  Expected Discharge Plan and Services Expected Discharge Plan: Home/Self Care (Patient declines needs at this time)   Discharge Planning Services: CM Consult Post Acute Care Choice: NA Living arrangements for the past 2 months: Single Family Home                                      Prior Living Arrangements/Services Living arrangements for the past 2 months: Single Family Home Lives with:: Self Patient language and need for interpreter reviewed:: Yes (No interpreter required) Do you feel safe going back to the place where you live?: Yes      Need for Family Participation in Patient Care: Yes (Comment) Care giver support system in place?: Yes (comment)   Criminal Activity/Legal Involvement Pertinent to Current Situation/Hospitalization: No - Comment as needed  Activities of Daily Living Home Assistive Devices/Equipment: None ADL Screening (condition at time of admission) Patient's cognitive ability adequate to safely complete daily activities?: Yes Is the patient deaf or have difficulty  hearing?: No Does the patient have difficulty seeing, even when wearing glasses/contacts?: No Does the patient have difficulty concentrating, remembering, or making decisions?: Yes Patient able to express need for assistance with ADLs?: Yes Does the patient have difficulty dressing or bathing?: No Independently performs ADLs?: Yes (appropriate for developmental age) Does the patient have difficulty walking or climbing stairs?: Yes Weakness of Legs: Both Weakness of Arms/Hands: Both  Permission Sought/Granted Permission sought to share information with : Case Manager Permission granted to share information with : Yes, Verbal Permission Granted              Emotional Assessment Appearance:: Appears stated age Attitude/Demeanor/Rapport: Gracious, Engaged Affect (typically observed): Pleasant, Appropriate Orientation: : Oriented to Self, Oriented to Place, Oriented to  Time, Oriented to Situation Alcohol / Substance Use: Not Applicable Psych Involvement: No (comment)  Admission diagnosis:  Neck pain [M54.2] Hyponatremia [E87.1] Generalized abdominal pain [R10.84] Nausea and vomiting, unspecified vomiting type [R11.2] Patient Active Problem List   Diagnosis Date Noted   Hyponatremia 06/01/2021   HLD (hyperlipidemia) 06/01/2021   Stroke ([place]) 06/01/2021   Neck pain 06/01/2021   Acute CVA (cerebrovascular accident) ([place]) 03/26/2021   Paresthesia 03/26/2021   Alzheimer's dementia without behavioral disturbance ([place])    Hiatal hernia with GERD 10/26/2020   Generalized weakness 06/15/2019   Pain due to onychomycosis of toenails of both feet 06/07/2019   S/P  kyphoplasty 06/01/2019   DDD (degenerative disc disease), lumbar 06/16/2017   SI (sacroiliac) joint dysfunction 06/16/2017   Incarcerated left inguinal hernia    GERD (gastroesophageal reflux disease) 09/20/2016   Dysphagia, unspecified 08/09/2016   Papilloma of breast 04/03/2015   Osteoporosis, post-menopausal 03/04/2015    Cannot sleep 03/04/2015   Essential hypertension 03/04/2015   Adenomatous colon polyp 03/04/2015   PCP:  [name], MD Pharmacy:   [facility] [place], [place] - [address] [facility] [place] [place] [address] Phone: [phone] Fax: [phone]     Social Determinants of Health (SDOH) Interventions    Readmission Risk Interventions No flowsheet data found.

## 2021-06-01 NOTE — ED Notes (Signed)
Breakfast meal tray given at this time. Repositioned pt in the bed and set up pt's breakfast.

## 2021-06-02 LAB — BASIC METABOLIC PANEL
Anion gap: 4 — ABNORMAL LOW (ref 5–15)
Anion gap: 5 (ref 5–15)
Anion gap: 6 (ref 5–15)
BUN: 11 mg/dL (ref 8–23)
BUN: 12 mg/dL (ref 8–23)
BUN: 18 mg/dL (ref 8–23)
CO2: 23 mmol/L (ref 22–32)
CO2: 24 mmol/L (ref 22–32)
CO2: 24 mmol/L (ref 22–32)
Calcium: 8.1 mg/dL — ABNORMAL LOW (ref 8.9–10.3)
Calcium: 8.5 mg/dL — ABNORMAL LOW (ref 8.9–10.3)
Calcium: 8.7 mg/dL — ABNORMAL LOW (ref 8.9–10.3)
Chloride: 100 mmol/L (ref 98–111)
Chloride: 100 mmol/L (ref 98–111)
Chloride: 102 mmol/L (ref 98–111)
Creatinine, Ser: 0.51 mg/dL (ref 0.44–1.00)
Creatinine, Ser: 0.56 mg/dL (ref 0.44–1.00)
Creatinine, Ser: 0.64 mg/dL (ref 0.44–1.00)
GFR, Estimated: >60 mL/min (ref >60)
GFR, Estimated: >60 mL/min (ref >60)
GFR, Estimated: >60 mL/min (ref >60)
Glucose, Bld: 85 mg/dL (ref 70–99)
Glucose, Bld: 89 mg/dL (ref 70–99)
Glucose, Bld: 94 mg/dL (ref 70–99)
Potassium: 3.4 mmol/L — ABNORMAL LOW (ref 3.5–5.1)
Potassium: 3.6 mmol/L (ref 3.5–5.1)
Potassium: 4.4 mmol/L (ref 3.5–5.1)
Sodium: 127 mmol/L — ABNORMAL LOW (ref 135–145)
Sodium: 129 mmol/L — ABNORMAL LOW (ref 135–145)
Sodium: 132 mmol/L — ABNORMAL LOW (ref 135–145)

## 2021-06-02 LAB — MAGNESIUM: Magnesium: 2 mg/dL (ref 1.7–2.4)

## 2021-06-02 MED ORDER — KETOROLAC TROMETHAMINE 30 MG/ML IJ SOLN
15.0000 mg | Freq: Once | INTRAMUSCULAR | Status: AC
  Administered 2021-06-02: 21:00:00 15 mg via INTRAVENOUS
  Filled 2021-06-02: qty 1

## 2021-06-02 MED ORDER — SODIUM CHLORIDE 0.9 % IV SOLN: INTRAVENOUS | Status: DC

## 2021-06-02 MED ORDER — POTASSIUM CHLORIDE CRYS ER 20 MEQ PO TBCR
40.0000 meq | EXTENDED_RELEASE_TABLET | Freq: Once | ORAL | Status: AC
  Administered 2021-06-02: 40 meq via ORAL
  Filled 2021-06-02: qty 2

## 2021-06-02 NOTE — Assessment & Plan Note (Signed)
Continue PPI. Hiatal hernia can increase risk for aspiration. Monitor.

## 2021-06-02 NOTE — Assessment & Plan Note (Signed)
CT cervical scan showed degenerative disc disease, nothing acute. --Lidocaine patch -- Tylenol as needed

## 2021-06-02 NOTE — Hospital Course (Signed)
88 year old female with past medical history of hypertension, hyperlipidemia, prior stroke, GERD, Alzheimer's disease, macular degeneration who presented to the ED on late evening 05/31/2021 with complaints of generalized weakness, poor appetite, poor oral intake with nausea and dry heaves.  Evaluation in the ED revealed acute hyponatremia with sodium 121, down from 135 2 months ago.  Remainder of the evaluation was unremarkable including negative COVID-19 PCR, nothing acute on CT scans of the head, cervical spine, and abdomen/pelvis.  Started on IV fluids and admitted to the medicine service for further evaluation management.

## 2021-06-02 NOTE — Assessment & Plan Note (Signed)
Continue PPI ?

## 2021-06-02 NOTE — Assessment & Plan Note (Signed)
K this morning 3.4.  Replacing with 40 oral KCl. -- Monitor and replace as needed

## 2021-06-02 NOTE — Assessment & Plan Note (Signed)
BP is stable to soft today, elevated yesterday. -- On home amlodipine 2.5 mg daily -- As needed hydralazine

## 2021-06-02 NOTE — Assessment & Plan Note (Signed)
-  Continue Lipitor °

## 2021-06-02 NOTE — Progress Notes (Signed)
°  Progress Note   Patient: Barbara Vega JGO:[id number] DOB: 03-08-1934 DOA: 06/01/2021     1 DOS: the patient was seen and examined on 06/02/2021   Brief hospital course: 88 year old female with past medical history of hypertension, hyperlipidemia, prior stroke, GERD, Alzheimer's disease, macular degeneration who presented to the ED on late evening 05/31/2021 with complaints of generalized weakness, poor appetite, poor oral intake with nausea and dry heaves.  Evaluation in the ED revealed acute hyponatremia with sodium 121, down from 135 2 months ago.  Remainder of the evaluation was unremarkable including negative COVID-19 PCR, nothing acute on CT scans of the head, cervical spine, and abdomen/pelvis.  Started on IV fluids and admitted to the medicine service for further evaluation management.  Assessment and Plan * Acute hyponatremia- (present on admission) Present with sodium 121.  2 months ago sodium was normal 135.  Patient's had poor appetite and decreased oral intake.  Sodium level improving with IV fluids. Na 129 around midnight last night.   -- Hold fluids this morning -- Await next repeat BMP -- Resume gentle NS based on next BMP -- Since this is acute hyponatremia, less concern for ODS/CPM if rapidly corrected, but will be cautious with rate of correction nonetheless -- Continue serial BMPs -- Encourage p.o. intake  Neck pain- (present on admission) CT cervical scan showed degenerative disc disease, nothing acute. --Lidocaine patch -- Tylenol as needed  Essential hypertension- (present on admission) BP is stable to soft today, elevated yesterday. -- On home amlodipine 2.5 mg daily -- As needed hydralazine  HLD (hyperlipidemia)- (present on admission) Continue Lipitor  Stroke [facility])- (present on admission) Continue aspirin Plavix and Lipitor  Alzheimer's dementia without behavioral disturbance ([place])- (present on admission) Continue Aricept and Depakote. Delirium  precautions.  Hiatal hernia with GERD Continue PPI. Hiatal hernia can increase risk for aspiration. Monitor.  Hypokalemia K this morning 3.4.  Replacing with 40 oral KCl. -- Monitor and replace as needed  GERD (gastroesophageal reflux disease)- (present on admission) Continue PPI     Subjective: Patient awake laying in bed when seen today.  She reports feeling better today.  She has not had a BM in a few days and request to softeners.  Denies any other acute complaints right now.  Objective Vitals reviewed and notable for soft blood pressures compared to yesterday but overall stable.  BP this morning 116/67, later 128/96.   General exam: awake, alert, no acute distress HEENT: moist mucus membranes, clear conjunctiva Respiratory system: CTAB, no wheezes, rales or rhonchi, normal respiratory effort. Cardiovascular system: normal S1/S2, RRR, no pedal edema.   Gastrointestinal system: soft, NT, ND, no HSM felt, +bowel sounds. Central nervous system: A& oriented to person and hospital. no gross focal neurologic deficits, normal speech Extremities: moves all, no edema, normal tone Skin: dry, intact, normal temperature Psychiatry: normal mood, congruent affect   Data Reviewed:  Labs reviewed and notable for, sodium at midnight 129, 830 this morning 127, potassium 3.4, calcium 8.1  Family Communication: None at bedside, will attempt to call  Disposition: Status is: Inpatient  Remains inpatient appropriate because: Severity of illness with electrolyte abnormalities requiring close monitoring and IV therapy as outlined above         Time spent: 35 minutes  Author: Ezekiel Slocumb, DO 06/02/2021 2:42 PM  For on call review [email or web address].

## 2021-06-02 NOTE — Assessment & Plan Note (Signed)
Continue aspirin Plavix and Lipitor

## 2021-06-02 NOTE — Assessment & Plan Note (Signed)
Present with sodium 121.  2 months ago sodium was normal 135.  Patient's had poor appetite and decreased oral intake.  Sodium level improving with IV fluids. Na 129 around midnight last night.   -- Hold fluids this morning -- Await next repeat BMP -- Resume gentle NS based on next BMP -- Since this is acute hyponatremia, less concern for ODS/CPM if rapidly corrected, but will be cautious with rate of correction nonetheless -- Continue serial BMPs -- Encourage p.o. intake

## 2021-06-02 NOTE — Assessment & Plan Note (Signed)
Continue Aricept and Depakote. Delirium precautions.

## 2021-06-03 DIAGNOSIS — K219 Gastro-esophageal reflux disease without esophagitis: Secondary | ICD-10-CM

## 2021-06-03 DIAGNOSIS — K449 Diaphragmatic hernia without obstruction or gangrene: Secondary | ICD-10-CM

## 2021-06-03 DIAGNOSIS — R1084 Generalized abdominal pain: Secondary | ICD-10-CM

## 2021-06-03 LAB — BASIC METABOLIC PANEL
Anion gap: 4 — ABNORMAL LOW (ref 5–15)
Anion gap: 5 (ref 5–15)
Anion gap: 5 (ref 5–15)
BUN: 12 mg/dL (ref 8–23)
BUN: 13 mg/dL (ref 8–23)
BUN: 13 mg/dL (ref 8–23)
CO2: 26 mmol/L (ref 22–32)
CO2: 26 mmol/L (ref 22–32)
CO2: 25 mmol/L (ref 22–32)
Calcium: 8.5 mg/dL — ABNORMAL LOW (ref 8.9–10.3)
Calcium: 8.6 mg/dL — ABNORMAL LOW (ref 8.9–10.3)
Calcium: 8.8 mg/dL — ABNORMAL LOW (ref 8.9–10.3)
Chloride: 100 mmol/L (ref 98–111)
Chloride: 98 mmol/L (ref 98–111)
Chloride: 101 mmol/L (ref 98–111)
Creatinine, Ser: 0.47 mg/dL (ref 0.44–1.00)
Creatinine, Ser: 0.65 mg/dL (ref 0.44–1.00)
Creatinine, Ser: 0.56 mg/dL (ref 0.44–1.00)
GFR, Estimated: >60 mL/min (ref >60)
GFR, Estimated: >60 mL/min (ref >60)
GFR, Estimated: >60 mL/min (ref >60)
Glucose, Bld: 87 mg/dL (ref 70–99)
Glucose, Bld: 95 mg/dL (ref 70–99)
Glucose, Bld: 127 mg/dL — ABNORMAL HIGH (ref 70–99)
Potassium: 3.5 mmol/L (ref 3.5–5.1)
Potassium: 3.7 mmol/L (ref 3.5–5.1)
Potassium: 4 mmol/L (ref 3.5–5.1)
Sodium: 130 mmol/L — ABNORMAL LOW (ref 135–145)
Sodium: 129 mmol/L — ABNORMAL LOW (ref 135–145)
Sodium: 131 mmol/L — ABNORMAL LOW (ref 135–145)

## 2021-06-03 LAB — CBC
HCT: 45 % (ref 36.0–46.0)
Hemoglobin: 15 g/dL (ref 12.0–15.0)
MCH: 28.5 pg (ref 26.0–34.0)
MCHC: 33.3 g/dL (ref 30.0–36.0)
MCV: 85.6 fL (ref 80.0–100.0)
Platelets: 330 10*3/uL (ref 150–400)
RBC: 5.26 MIL/uL — ABNORMAL HIGH (ref 3.87–5.11)
RDW: 12.3 % (ref 11.5–15.5)
WBC: 5.9 10*3/uL (ref 4.0–10.5)
nRBC: 0 % (ref 0.0–0.2)

## 2021-06-03 LAB — GLUCOSE, CAPILLARY
Glucose-Capillary: 85 mg/dL (ref 70–99)
Glucose-Capillary: 90 mg/dL (ref 70–99)
Glucose-Capillary: 95 mg/dL (ref 70–99)

## 2021-06-03 LAB — MAGNESIUM: Magnesium: 2 mg/dL (ref 1.7–2.4)

## 2021-06-03 MED ORDER — DICLOFENAC SODIUM 1 % EX GEL
2.0000 g | Freq: Four times a day (QID) | CUTANEOUS | Status: DC
  Administered 2021-06-03 – 2021-06-04 (×3): 2 g via TOPICAL
  Filled 2021-06-03: qty 100

## 2021-06-03 MED ORDER — BACLOFEN 10 MG PO TABS
5.0000 mg | ORAL_TABLET | Freq: Once | ORAL | Status: AC | PRN
  Administered 2021-06-03: 5 mg via ORAL
  Filled 2021-06-03: qty 1

## 2021-06-03 NOTE — Progress Notes (Signed)
Pt sad, tired. Didn't want to visit for long so [place] said "I'll check on you again." Didn't delve into spiritual today.

## 2021-06-03 NOTE — Progress Notes (Signed)
PROGRESS NOTE  Barbara Vega    DOB: 08-23-1933, 88 y.o.  WPY:[id number]  PCP: [name], MD   Code Status: DNR   DOA: 06/01/2021   LOS: 2  Brief Narrative of Current Hospitalization  Barbara Vega is a 88 y.o. female with a PMH significant for HTN, HLD, stroke, GERD, Alzheimer's disease, macular degeneration, hard of hearing. They presented from home to the ED on 06/01/2021 with generalized weakness, poor appetite, decreased p.o. x several days. In the ED, it was found that they had hyponatremia which was suspected to be due to poor oral intake. They were treated with IV hydration and electrolyte monitoring.  Patient was admitted to medicine service for further workup and management of hyponatremia as outlined in detail below.  06/03/21 -stable, improved  Assessment & Plan  Principal Problem:   Acute hyponatremia Active Problems:   Essential hypertension   GERD (gastroesophageal reflux disease)   Hypokalemia   Hiatal hernia with GERD   Alzheimer's dementia without behavioral disturbance (HCC)   HLD (hyperlipidemia)   Stroke (HCC)   Neck pain  Acute hyponatremia- (present on admission)- improving Presented with sodium 121>>>130.  2 months ago sodium was normal 135.  Patient's had poor appetite and decreased oral intake.  -- Continue serial BMPs -- Encourage p.o. intake - continue IV fluids until taking good PO and then discontinue   Neck pain- (present on admission) CT cervical scan showed degenerative disc disease, nothing acute. --Lidocaine patch -- Tylenol as needed   Essential hypertension- (present on admission) -- discontinue home amlodipine 2.5 mg daily due to low-normal pressures and prefer to not have tight control in age group for safety profile   HLD (hyperlipidemia)- (present on admission) Continue Lipitor   Stroke [facility])- (present on admission) Continue aspirin Plavix and Lipitor   Alzheimer's dementia without behavioral disturbance ([place])- (present on  admission) Continue Aricept and Depakote. Delirium precautions.   Hiatal hernia with GERD Continue PPI. Hiatal hernia can increase risk for aspiration. Monitor.   Hypokalemia- resolved -- Monitor and replace as needed   GERD (gastroesophageal reflux disease)- (present on admission) Continue PPI  DVT prophylaxis: enoxaparin (LOVENOX) injection 40 mg Start: 06/01/21 2200   Diet:  Diet Orders (From admission, onward)     Start     Ordered   06/01/21 0824  Diet regular Room service appropriate? Yes; Fluid consistency: Thin; Fluid restriction: 1200 mL Fluid  Diet effective now       Question Answer Comment  Room service appropriate? Yes   Fluid consistency: Thin   Fluid restriction: 1200 mL Fluid      06/01/21 0823            Subjective 06/03/21    Pt reports no complaints today. Her HA overnight has now resolved. She does not feel short of breath despite her oxygen Sun being on the floor.   Disposition Plan & Communication  Patient status: Inpatient  Admitted From: Home Disposition: Home Anticipated discharge date: 1/19  Family Communication: none  Consults, Procedures, Significant Events  Consultants:  none  Procedures/significant events:  None  Antimicrobials:  Anti-infectives (From admission, onward)    None       Objective   Vitals:   06/02/21 1137 06/02/21 1603 06/02/21 1954 06/03/21 0436  BP: (!) 111/58 119/60 115/60 130/62  Pulse: 63 67 71 (!) 56  Resp: 18 15 18 20   Temp: 98.4 F (36.9 C) 98.6 F (37 C) 98.6 F (37 C) 97.8 F (36.6 C)  TempSrc: Oral Oral Oral Oral  SpO2: 93% 94% 93% 96%    Intake/Output Summary (Last 24 hours) at 06/03/2021 0654 Last data filed at 06/03/2021 0400 Gross per 24 hour  Intake 830.48 ml  Output --  Net 830.48 ml   Physical Exam:  General: awake, alert, NAD HEENT: atraumatic, clear conjunctiva, anicteric sclera, MMM, hard of hearing Respiratory: normal respiratory effort. Cardiovascular: normal S1/S2,  RRR, quick capillary refill  Nervous: A&O x2. no gross focal neurologic deficits, normal speech Extremities: moves all equally, trace edema, normal tone Skin: dry, intact, normal temperature, normal color. No rashes, lesions or ulcers on exposed skin Psychiatry: normal mood, congruent affect  Labs   I have personally reviewed following labs and imaging studies Admission on 06/01/2021  Component Date Value Ref Range Status   Sodium 05/31/2021 122 (L)  135 - 145 mmol/L Final   Potassium 05/31/2021 3.7  3.5 - 5.1 mmol/L Final   Chloride 05/31/2021 91 (L)  98 - 111 mmol/L Final   CO2 05/31/2021 20 (L)  22 - 32 mmol/L Final   Glucose, Bld 05/31/2021 152 (H)  70 - 99 mg/dL Final   BUN 05/31/2021 9  8 - 23 mg/dL Final   Creatinine, Ser 05/31/2021 0.48  0.44 - 1.00 mg/dL Final   Calcium 05/31/2021 9.3  8.9 - 10.3 mg/dL Final   Total Protein 05/31/2021 7.0  6.5 - 8.1 g/dL Final   Albumin 05/31/2021 4.3  3.5 - 5.0 g/dL Final   AST 05/31/2021 29  15 - 41 U/L Final   ALT 05/31/2021 11  0 - 44 U/L Final   Alkaline Phosphatase 05/31/2021 64  38 - 126 U/L Final   Total Bilirubin 05/31/2021 1.2  0.3 - 1.2 mg/dL Final   GFR, Estimated 05/31/2021 >60  >60 mL/min Final   Anion gap 05/31/2021 11  5 - 15 Final   Troponin I (High Sensitivity) 05/31/2021 7  <18 ng/L Final   SARS Coronavirus 2 by RT PCR 05/31/2021 NEGATIVE  NEGATIVE Final   Influenza A by PCR 05/31/2021 NEGATIVE  NEGATIVE Final   Influenza B by PCR 05/31/2021 NEGATIVE  NEGATIVE Final   D-Dimer, Quant 05/31/2021 <0.27  0.00 - 0.50 ug/mL-FEU Final   WBC 06/01/2021 10.5  4.0 - 10.5 K/uL Final   RBC 06/01/2021 6.09 (H)  3.87 - 5.11 MIL/uL Final   Hemoglobin 06/01/2021 17.6 (H)  12.0 - 15.0 g/dL Final   HCT 06/01/2021 50.5 (H)  36.0 - 46.0 % Final   MCV 06/01/2021 82.9  80.0 - 100.0 fL Final   MCH 06/01/2021 28.9  26.0 - 34.0 pg Final   MCHC 06/01/2021 34.9  30.0 - 36.0 g/dL Final   RDW 06/01/2021 12.0  11.5 - 15.5 % Final   Platelets  06/01/2021 474 (H)  150 - 400 K/uL Final   nRBC 06/01/2021 0.0  0.0 - 0.2 % Final   Neutrophils Relative % 06/01/2021 78  % Final   Neutro Abs 06/01/2021 8.1 (H)  1.7 - 7.7 K/uL Final   Lymphocytes Relative 06/01/2021 14  % Final   Lymphs Abs 06/01/2021 1.5  0.7 - 4.0 K/uL Final   Monocytes Relative 06/01/2021 7  % Final   Monocytes Absolute 06/01/2021 0.8  0.1 - 1.0 K/uL Final   Eosinophils Relative 06/01/2021 0  % Final   Eosinophils Absolute 06/01/2021 0.0  0.0 - 0.5 K/uL Final   Basophils Relative 06/01/2021 1  % Final   Basophils Absolute 06/01/2021 0.1  0.0 - 0.1 K/uL  Final   Immature Granulocytes 06/01/2021 0  % Final   Abs Immature Granulocytes 06/01/2021 0.04  0.00 - 0.07 K/uL Final   Troponin I (High Sensitivity) 06/01/2021 7  <18 ng/L Final   Color, Urine 06/01/2021 YELLOW  YELLOW Final   APPearance 06/01/2021 CLEAR  CLEAR Final   Specific Gravity, Urine 06/01/2021 1.015  1.005 - 1.030 Final   pH 06/01/2021 6.5  5.0 - 8.0 Final   Glucose, UA 06/01/2021 NEGATIVE  NEGATIVE mg/dL Final   Hgb urine dipstick 06/01/2021 TRACE (A)  NEGATIVE Final   Bilirubin Urine 06/01/2021 NEGATIVE  NEGATIVE Final   Ketones, ur 06/01/2021 40 (A)  NEGATIVE mg/dL Final   Protein, ur 06/01/2021 30 (A)  NEGATIVE mg/dL Final   Nitrite 06/01/2021 NEGATIVE  NEGATIVE Final   Leukocytes,Ua 06/01/2021 NEGATIVE  NEGATIVE Final   Squamous Epithelial / LPF 06/01/2021 0-5  0 - 5 Final   WBC, UA 06/01/2021 0-5  0 - 5 WBC/hpf Final   RBC / HPF 06/01/2021 11-20  0 - 5 RBC/hpf Final   Bacteria, UA 06/01/2021 NONE SEEN  NONE SEEN Final   Mucus 06/01/2021 PRESENT   Final   Sodium, Ur 06/01/2021 73  mmol/L Final   Osmolality 05/31/2021 262 (L)  275 - 295 mOsm/kg Final   Osmolality, Ur 06/01/2021 588  300 - 900 mOsm/kg Final   Sodium 06/01/2021 121 (L)  135 - 145 mmol/L Final   Potassium 06/01/2021 3.7  3.5 - 5.1 mmol/L Final   Chloride 06/01/2021 92 (L)  98 - 111 mmol/L Final   CO2 06/01/2021 18 (L)  22 - 32  mmol/L Final   Glucose, Bld 06/01/2021 120 (H)  70 - 99 mg/dL Final   BUN 06/01/2021 11  8 - 23 mg/dL Final   Creatinine, Ser 06/01/2021 0.55  0.44 - 1.00 mg/dL Final   Calcium 06/01/2021 9.0  8.9 - 10.3 mg/dL Final   GFR, Estimated 06/01/2021 >60  >60 mL/min Final   Anion gap 06/01/2021 11  5 - 15 Final   Sodium 06/01/2021 127 (L)  135 - 145 mmol/L Final   Potassium 06/01/2021 3.3 (L)  3.5 - 5.1 mmol/L Final   Chloride 06/01/2021 95 (L)  98 - 111 mmol/L Final   CO2 06/01/2021 25  22 - 32 mmol/L Final   Glucose, Bld 06/01/2021 97  70 - 99 mg/dL Final   BUN 06/01/2021 9  8 - 23 mg/dL Final   Creatinine, Ser 06/01/2021 0.52  0.44 - 1.00 mg/dL Final   Calcium 06/01/2021 8.7 (L)  8.9 - 10.3 mg/dL Final   GFR, Estimated 06/01/2021 >60  >60 mL/min Final   Anion gap 06/01/2021 7  5 - 15 Final   Lipase 06/01/2021 29  11 - 51 U/L Final   Sodium 06/02/2021 129 (L)  135 - 145 mmol/L Final   Potassium 06/02/2021 3.6  3.5 - 5.1 mmol/L Final   Chloride 06/02/2021 100  98 - 111 mmol/L Final   CO2 06/02/2021 24  22 - 32 mmol/L Final   Glucose, Bld 06/02/2021 89  70 - 99 mg/dL Final   BUN 06/02/2021 11  8 - 23 mg/dL Final   Creatinine, Ser 06/02/2021 0.51  0.44 - 1.00 mg/dL Final   Calcium 06/02/2021 8.5 (L)  8.9 - 10.3 mg/dL Final   GFR, Estimated 06/02/2021 >60  >60 mL/min Final   Anion gap 06/02/2021 5  5 - 15 Final   Sodium 06/02/2021 127 (L)  135 - 145 mmol/L Final  Potassium 06/02/2021 3.4 (L)  3.5 - 5.1 mmol/L Final   Chloride 06/02/2021 100  98 - 111 mmol/L Final   CO2 06/02/2021 23  22 - 32 mmol/L Final   Glucose, Bld 06/02/2021 85  70 - 99 mg/dL Final   BUN 06/02/2021 12  8 - 23 mg/dL Final   Creatinine, Ser 06/02/2021 0.56  0.44 - 1.00 mg/dL Final   Calcium 06/02/2021 8.1 (L)  8.9 - 10.3 mg/dL Final   GFR, Estimated 06/02/2021 >60  >60 mL/min Final   Anion gap 06/02/2021 4 (L)  5 - 15 Final   Sodium 06/02/2021 132 (L)  135 - 145 mmol/L Final   Potassium 06/02/2021 4.4  3.5 - 5.1  mmol/L Final   Chloride 06/02/2021 102  98 - 111 mmol/L Final   CO2 06/02/2021 24  22 - 32 mmol/L Final   Glucose, Bld 06/02/2021 94  70 - 99 mg/dL Final   BUN 06/02/2021 18  8 - 23 mg/dL Final   Creatinine, Ser 06/02/2021 0.64  0.44 - 1.00 mg/dL Final   Calcium 06/02/2021 8.7 (L)  8.9 - 10.3 mg/dL Final   GFR, Estimated 06/02/2021 >60  >60 mL/min Final   Anion gap 06/02/2021 6  5 - 15 Final   Magnesium 06/02/2021 2.0  1.7 - 2.4 mg/dL Final   Sodium 06/03/2021 130 (L)  135 - 145 mmol/L Final   Potassium 06/03/2021 3.5  3.5 - 5.1 mmol/L Final   Chloride 06/03/2021 100  98 - 111 mmol/L Final   CO2 06/03/2021 26  22 - 32 mmol/L Final   Glucose, Bld 06/03/2021 87  70 - 99 mg/dL Final   BUN 06/03/2021 12  8 - 23 mg/dL Final   Creatinine, Ser 06/03/2021 0.47  0.44 - 1.00 mg/dL Final   Calcium 06/03/2021 8.5 (L)  8.9 - 10.3 mg/dL Final   GFR, Estimated 06/03/2021 >60  >60 mL/min Final   Anion gap 06/03/2021 4 (L)  5 - 15 Final   WBC 06/03/2021 5.9  4.0 - 10.5 K/uL Final   RBC 06/03/2021 5.26 (H)  3.87 - 5.11 MIL/uL Final   Hemoglobin 06/03/2021 15.0  12.0 - 15.0 g/dL Final   HCT 06/03/2021 45.0  36.0 - 46.0 % Final   MCV 06/03/2021 85.6  80.0 - 100.0 fL Final   MCH 06/03/2021 28.5  26.0 - 34.0 pg Final   MCHC 06/03/2021 33.3  30.0 - 36.0 g/dL Final   RDW 06/03/2021 12.3  11.5 - 15.5 % Final   Platelets 06/03/2021 330  150 - 400 K/uL Final   nRBC 06/03/2021 0.0  0.0 - 0.2 % Final   Magnesium 06/03/2021 2.0  1.7 - 2.4 mg/dL Final    Imaging Studies  No results found.  Medications   Scheduled Meds:  amLODipine  2.5 mg Oral Daily   aspirin EC  81 mg Oral Daily   atorvastatin  20 mg Oral Daily   clopidogrel  75 mg Oral Daily   divalproex  250 mg Oral QHS   divalproex  375 mg Oral Daily   donepezil  10 mg Oral Daily   enoxaparin (LOVENOX) injection  40 mg Subcutaneous Q24H   lidocaine  1 patch Transdermal Q24H   pantoprazole  40 mg Oral Daily   triamcinolone cream   Topical BID    No recently discontinued medications to reconcile  LOS: 2 days   Richarda Osmond, DO [facility] 06/03/2021, 6:54 AM   Available by Epic secure chat 7AM-7PM. If  7PM-7AM, please contact night-coverage Refer to [email or web address] to contact the [facility] Attending or Consulting provider for this pt

## 2021-06-04 DIAGNOSIS — E876 Hypokalemia: Secondary | ICD-10-CM

## 2021-06-04 DIAGNOSIS — E7849 Other hyperlipidemia: Secondary | ICD-10-CM

## 2021-06-04 LAB — BASIC METABOLIC PANEL
Anion gap: 5 (ref 5–15)
BUN: 11 mg/dL (ref 8–23)
CO2: 27 mmol/L (ref 22–32)
Calcium: 8.4 mg/dL — ABNORMAL LOW (ref 8.9–10.3)
Chloride: 100 mmol/L (ref 98–111)
Creatinine, Ser: 0.59 mg/dL (ref 0.44–1.00)
GFR, Estimated: >60 mL/min (ref >60)
Glucose, Bld: 89 mg/dL (ref 70–99)
Potassium: 3.9 mmol/L (ref 3.5–5.1)
Sodium: 132 mmol/L — ABNORMAL LOW (ref 135–145)

## 2021-06-04 MED ORDER — MELATONIN 5 MG PO TABS: 5.0000 mg | ORAL_TABLET | Freq: Every day | ORAL | Status: DC

## 2021-06-04 MED ORDER — MELATONIN 5 MG PO TABS
5.0000 mg | ORAL_TABLET | Freq: Once | ORAL | Status: DC
  Filled 2021-06-04: qty 1

## 2021-06-04 NOTE — Discharge Summary (Signed)
Physician Discharge Summary  Barbara Vega KGY:[id number] DOB: 1934-02-07 DOA: 06/01/2021  PCP: Barbara Pink, MD  Admit date: 06/01/2021 Discharge date: 06/04/2021  Admitted From: Home Disposition: Relative's home  Recommendations for Outpatient Follow-up:  Follow up with PCP within 1-2 weeks Monitor electrolytes including Na which was 133 on day of discharge Recommend assessing for barriers that may be preventing good PO such as tooth pain, gastroparesis, gerd, depression, treatment for her hiatal hernia, etc as she seems to have frequent admissions with electrolyte disturbances from poor PO.  Discharge Condition:stable, improved CODE STATUS:  Code Status: DNR  Regular healthy diet  Brief/Interim Summary: Pt presented to ED from home with her daughter with generalized weakness, poor appetite, decreased p.o. x several days likely due to viral gastritis. In the ED, it was found that they had hyponatremia as low as 121 which was suspected to be due to poor oral intake. They were treated with IV hydration and electrolyte monitoring. Her overall status was improved over the course of admission as her PO intake increased. On day of discharge, her Na+ level was 132 and she was tolerating a normal diet.  Her amlodipine was held due to soft blood pressures.  She experienced left sided neck muscular pain which was improved with a heating pad and tylenol.  All other chronic conditions were treated with home medications.   Discharge Diagnoses:  Principal Problem:   Acute hyponatremia Active Problems:   Essential hypertension   GERD (gastroesophageal reflux disease)   Hypokalemia   Hiatal hernia with GERD   Alzheimer's dementia without behavioral disturbance (HCC)   HLD (hyperlipidemia)   Stroke (HCC)   Neck pain   Generalized abdominal pain   Allergies as of 06/04/2021       Reactions   Codone [hydrocodone] Nausea And Vomiting        Medication List     STOP taking these  medications    amLODipine 2.5 MG tablet Commonly known as: NORVASC   doxycycline 100 MG capsule Commonly known as: VIBRAMYCIN   pantoprazole 40 MG tablet Commonly known as: Protonix   PreserVision AREDS 2+Multi Vit Caps   triamcinolone cream 0.1 % Commonly known as: KENALOG       TAKE these medications    aspirin 81 MG EC tablet Take 1 tablet (81 mg total) by mouth daily. Swallow whole.   atorvastatin 20 MG tablet Commonly known as: LIPITOR Take 1 tablet by mouth daily. What changed: Another medication with the same name was removed. Continue taking this medication, and follow the directions you see here.   clopidogrel 75 MG tablet Commonly known as: PLAVIX Take 1 tablet (75 mg total) by mouth daily.   divalproex 125 MG DR tablet Commonly known as: DEPAKOTE Take 250-375 mg by mouth 2 (two) times daily.   donepezil 10 MG tablet Commonly known as: ARICEPT Take 10 mg by mouth daily. What changed: Another medication with the same name was removed. Continue taking this medication, and follow the directions you see here.        Allergies  Allergen Reactions   Codone [Hydrocodone] Nausea And Vomiting    Consultations: None   Procedures/Studies: DG Chest 2 View  Result Date: 05/31/2021 CLINICAL DATA:  SOB EXAM: CHEST - 2 VIEW COMPARISON:  CT chest 07/29/2010, chest x-ray 10/26/2020 FINDINGS: The heart and mediastinal contours are unchanged. Aortic calcification. Low lung volumes. No focal consolidation. Chronic coarsened interstitial markings with no overt pulmonary edema. No pleural effusion. No pneumothorax. No acute  osseous abnormality. Kyphoplasty of couple of lower thoracic vertebral bodies. IMPRESSION: Low lung volumes with no active cardiopulmonary disease. Electronically Signed   By: Iven Finn M.D.   On: 05/31/2021 22:24   CT Head Wo Contrast  Addendum Date: 06/01/2021   ADDENDUM REPORT: 06/01/2021 01:59 ADDENDUM: Not mentioned above, there is a  hypodense 2.3 cm mass of the right lobe of the thyroid gland measuring 2.3 cm, 2.0 cm in size on soft tissue neck CT from 11/15/2017. This subsequently underwent FNA biopsy 12/08/2017 with unknown results. Apparent size increase is somewhat concerning and repeat thyroid ultrasound is recommended as well as correlation with the prior biopsy results. Electronically Signed   By: Telford Nab M.D.   On: 06/01/2021 01:59   Result Date: 06/01/2021 CLINICAL DATA:  Neck pain and headache. EXAM: CT HEAD WITHOUT CONTRAST CT CERVICAL SPINE WITHOUT CONTRAST TECHNIQUE: Multidetector CT imaging of the head and cervical spine was performed following the standard protocol without intravenous contrast. Multiplanar CT image reconstructions of the cervical spine were also generated. RADIATION DOSE REDUCTION: This exam was performed according to the departmental dose-optimization program which includes automated exposure control, adjustment of the mA and/or kV according to patient size and/or use of iterative reconstruction technique. COMPARISON:  No prior cervical spine imaging for comparison. Comparison is made with the head CT of 03/26/2021. MR brain also 03/26/2021 FINDINGS: CT HEAD FINDINGS Brain: Left thalamocapsular lacunar infarcts noted on the prior study in the acute phase now have a chronic appearance. There is moderately advanced cerebral atrophy, small vessel disease and atrophic ventriculomegaly mild cerebellar atrophy. No asymmetry is seen concerning for acute cortical based infarct, hemorrhage or midline shift, mass or mass effect. Vascular: There are calcifications in the distal vertebral arteries and siphons but no hyperdense central vasculature. Skull: The calvarium, skull base and orbits are intact. There is osteopenia. No focal bone lesion. Sinuses/Orbits: No acute finding.  Old lens extractions. Other: None. CT CERVICAL SPINE FINDINGS Alignment: Reversed cervical lordosis is noted. Trace discogenic C5-6  retrolisthesis without further listhesis. Slight cervical dextroscoliosis. Skull base and vertebrae: Osteopenia. No fracture or focal bone lesion is seen. Soft tissues and spinal canal: No prevertebral fluid or swelling. No visible canal hematoma. Disc levels: There is a disc collapse with chronic appearance at C5-6. Mild disc space loss C4-5 and C6-7. Other cervical discs are normal in heights. At C5-6 and C6-7 there are small bidirectional osteophytes mildly encroaching on the ventral thecal sac, but not causing cord compression. Other levels do not show evidence of herniated discs or other significant soft tissue or bony encroachment on the thecal sac. Uncinate joint and facet spurs are seen at most levels and are greater on the left, with foraminal stenosis which is mild on the left at C2-3, moderate to severe on the left C3-4, bilaterally mild C5-6. Other foramina are clear. Upper chest: Negative. Other: None. IMPRESSION: 1. No acute intracranial CT findings. Lacunar infarcts in the left thalamocapsular area have a chronic appearance, previously acute. 2. Atrophy and small vessel disease. 3. Osteopenia and degenerative change cervical spine without evidence of fracture. 4. Slight cervical kyphodextroscoliosis with C5-6 discogenic retrolisthesis. 5. Degenerative foraminal stenosis as above. No cord compromise or herniated discs. Electronically Signed: By: Telford Nab M.D. On: 05/31/2021 23:47   CT CERVICAL SPINE WO CONTRAST  Addendum Date: 06/01/2021   ADDENDUM REPORT: 06/01/2021 01:59 ADDENDUM: Not mentioned above, there is a hypodense 2.3 cm mass of the right lobe of the thyroid gland measuring 2.3  cm, 2.0 cm in size on soft tissue neck CT from 11/15/2017. This subsequently underwent FNA biopsy 12/08/2017 with unknown results. Apparent size increase is somewhat concerning and repeat thyroid ultrasound is recommended as well as correlation with the prior biopsy results. Electronically Signed   By: Telford Nab M.D.   On: 06/01/2021 01:59   Result Date: 06/01/2021 CLINICAL DATA:  Neck pain and headache. EXAM: CT HEAD WITHOUT CONTRAST CT CERVICAL SPINE WITHOUT CONTRAST TECHNIQUE: Multidetector CT imaging of the head and cervical spine was performed following the standard protocol without intravenous contrast. Multiplanar CT image reconstructions of the cervical spine were also generated. RADIATION DOSE REDUCTION: This exam was performed according to the departmental dose-optimization program which includes automated exposure control, adjustment of the mA and/or kV according to patient size and/or use of iterative reconstruction technique. COMPARISON:  No prior cervical spine imaging for comparison. Comparison is made with the head CT of 03/26/2021. MR brain also 03/26/2021 FINDINGS: CT HEAD FINDINGS Brain: Left thalamocapsular lacunar infarcts noted on the prior study in the acute phase now have a chronic appearance. There is moderately advanced cerebral atrophy, small vessel disease and atrophic ventriculomegaly mild cerebellar atrophy. No asymmetry is seen concerning for acute cortical based infarct, hemorrhage or midline shift, mass or mass effect. Vascular: There are calcifications in the distal vertebral arteries and siphons but no hyperdense central vasculature. Skull: The calvarium, skull base and orbits are intact. There is osteopenia. No focal bone lesion. Sinuses/Orbits: No acute finding.  Old lens extractions. Other: None. CT CERVICAL SPINE FINDINGS Alignment: Reversed cervical lordosis is noted. Trace discogenic C5-6 retrolisthesis without further listhesis. Slight cervical dextroscoliosis. Skull base and vertebrae: Osteopenia. No fracture or focal bone lesion is seen. Soft tissues and spinal canal: No prevertebral fluid or swelling. No visible canal hematoma. Disc levels: There is a disc collapse with chronic appearance at C5-6. Mild disc space loss C4-5 and C6-7. Other cervical discs are normal in  heights. At C5-6 and C6-7 there are small bidirectional osteophytes mildly encroaching on the ventral thecal sac, but not causing cord compression. Other levels do not show evidence of herniated discs or other significant soft tissue or bony encroachment on the thecal sac. Uncinate joint and facet spurs are seen at most levels and are greater on the left, with foraminal stenosis which is mild on the left at C2-3, moderate to severe on the left C3-4, bilaterally mild C5-6. Other foramina are clear. Upper chest: Negative. Other: None. IMPRESSION: 1. No acute intracranial CT findings. Lacunar infarcts in the left thalamocapsular area have a chronic appearance, previously acute. 2. Atrophy and small vessel disease. 3. Osteopenia and degenerative change cervical spine without evidence of fracture. 4. Slight cervical kyphodextroscoliosis with C5-6 discogenic retrolisthesis. 5. Degenerative foraminal stenosis as above. No cord compromise or herniated discs. Electronically Signed: By: Telford Nab M.D. On: 05/31/2021 23:47   CT ABDOMEN PELVIS W CONTRAST  Result Date: 06/01/2021 CLINICAL DATA:  Acute and nonlocalized abdominal pain EXAM: CT ABDOMEN AND PELVIS WITH CONTRAST TECHNIQUE: Multidetector CT imaging of the abdomen and pelvis was performed using the standard protocol following bolus administration of intravenous contrast. RADIATION DOSE REDUCTION: This exam was performed according to the departmental dose-optimization program which includes automated exposure control, adjustment of the mA and/or kV according to patient size and/or use of iterative reconstruction technique. CONTRAST:  60mL OMNIPAQUE IOHEXOL 300 MG/ML  SOLN COMPARISON:  10/26/2020 FINDINGS: Lower chest:  No acute finding.  Moderate sliding hiatal hernia. Hepatobiliary: Small cystic  densities in the left lobe liver.No evidence of biliary obstruction or stone. Pancreas: 1 cm simple cystic density in the pancreatic neck, stable when compared to 2019  and non worrisome especially for patient age. Spleen: Unremarkable. Adrenals/Urinary Tract: 2 cm left adrenal nodule which is stable over multiple years and attributed to adenoma. No hydronephrosis or stone. Unremarkable bladder. Stomach/Bowel: No obstruction. Left colonic diverticulosis. No evidence of bowel inflammation Vascular/Lymphatic: No acute vascular abnormality. Atheromatous calcification that is expected for age. No mass or adenopathy. Reproductive:Hysterectomy. Other: No ascites or pneumoperitoneum. Musculoskeletal: No acute abnormalities. Advanced lumbar spine degeneration with scoliosis. Remote T11 and T12 compression fractures with cement augmentation. IMPRESSION: 1. Stable compared to June 2022. No acute finding or specific cause for pain. 2. Chronic findings are described above. Electronically Signed   By: Jorje Guild M.D.   On: 06/01/2021 05:37    Subjective: Patient feels well today. She denies any complaints.   Discharge Exam: Vitals:   06/04/21 0456 06/04/21 0729  BP: (!) 117/59 (!) 123/59  Pulse: (!) 54 (!) 55  Resp: 16 17  Temp: 97.7 F (36.5 C) 97.6 F (36.4 C)  SpO2: 95% 96%    General: Pt is alert, awake, not in acute distress Cardiovascular: RRR, S1/S2 +, no rubs, no gallops Respiratory: CTA bilaterally, no wheezing, no rhonchi Abdominal: Soft, NT, ND, bowel sounds + Extremities: no edema, no cyanosis  Labs: Basic Metabolic Panel: Recent Labs  Lab 06/02/21 0837 06/02/21 1949 06/03/21 0507 06/03/21 1155 06/03/21 2002 06/04/21 0806  NA 127* 132* 130* 129* 131* 132*  K 3.4* 4.4 3.5 3.7 4.0 3.9  CL 100 102 100 98 101 100  CO2 23 24 26 26 25 27   GLUCOSE 85 94 87 95 127* 89  BUN 12 18 12 13 13 11   CREATININE 0.56 0.64 0.47 0.65 0.56 0.59  CALCIUM 8.1* 8.7* 8.5* 8.6* 8.8* 8.4*  MG 2.0  --  2.0  --   --   --    CBC: Recent Labs  Lab 06/01/21 0338 06/03/21 0507  WBC 10.5 5.9  NEUTROABS 8.1*  --   HGB 17.6* 15.0  HCT 50.5* 45.0  MCV 82.9 85.6   PLT 474* 330    Microbiology Recent Results (from the past 240 hour(s))  Resp Panel by RT-PCR (Flu A&B, Covid) Nasopharyngeal Swab     Status: None   Collection Time: 05/31/21  9:20 PM   Specimen: Nasopharyngeal Swab; Nasopharyngeal(NP) swabs in vial transport medium  Result Value Ref Range Status   SARS Coronavirus 2 by RT PCR NEGATIVE NEGATIVE Final    Comment: (NOTE) SARS-CoV-2 target nucleic acids are NOT DETECTED.  The SARS-CoV-2 RNA is generally detectable in upper respiratory specimens during the acute phase of infection. The lowest concentration of SARS-CoV-2 viral copies this assay can detect is 138 copies/mL. A negative result does not preclude SARS-Cov-2 infection and should not be used as the sole basis for treatment or other patient management decisions. A negative result may occur with  improper specimen collection/handling, submission of specimen other than nasopharyngeal swab, presence of viral mutation(s) within the areas targeted by this assay, and inadequate number of viral copies(<138 copies/mL). A negative result must be combined with clinical observations, patient history, and epidemiological information. The expected result is Negative.  Fact Sheet for Patients:  EntrepreneurPulse.com.au  Fact Sheet for Healthcare Providers:  IncredibleEmployment.be  This test is no t yet approved or cleared by the [place] FDA and  has been authorized for detection and/or  diagnosis of SARS-CoV-2 by FDA under an Emergency Use Authorization (EUA). This EUA will remain  in effect (meaning this test can be used) for the duration of the COVID-19 declaration under Section 564(b)(1) of the Act, 21 U.S.C.section 360bbb-3(b)(1), unless the authorization is terminated  or revoked sooner.       Influenza A by PCR NEGATIVE NEGATIVE Final   Influenza B by PCR NEGATIVE NEGATIVE Final    Comment: (NOTE) The Xpert Xpress SARS-CoV-2/FLU/RSV  plus assay is intended as an aid in the diagnosis of influenza from Nasopharyngeal swab specimens and should not be used as a sole basis for treatment. Nasal washings and aspirates are unacceptable for Xpert Xpress SARS-CoV-2/FLU/RSV testing.  Fact Sheet for Patients: EntrepreneurPulse.com.au  Fact Sheet for Healthcare Providers: IncredibleEmployment.be  This test is not yet approved or cleared by the [place] FDA and has been authorized for detection and/or diagnosis of SARS-CoV-2 by FDA under an Emergency Use Authorization (EUA). This EUA will remain in effect (meaning this test can be used) for the duration of the COVID-19 declaration under Section 564(b)(1) of the Act, 21 U.S.C. section 360bbb-3(b)(1), unless the authorization is terminated or revoked.  Performed at [facility], [address] [place], [place] [address]     Time coordinating discharge: Over 30 minutes  Richarda Osmond, MD  [facility] 06/04/2021, 10:57 AM

## 2021-06-04 NOTE — Plan of Care (Signed)

## 2021-06-04 NOTE — Care Management Important Message (Signed)
Important Message  Patient Details  Name: Barbara Vega MRN: [id number] Date of Birth: 11/02/1933   Medicare Important Message Given:  Yes     Juliann Pulse A Camay Pedigo 06/04/2021, 10:57 AM

## 2021-08-11 ENCOUNTER — Inpatient Hospital Stay: Payer: Medicare Other

## 2021-08-11 ENCOUNTER — Emergency Department: Payer: Medicare Other

## 2021-08-11 ENCOUNTER — Inpatient Hospital Stay
Admission: EM | Admit: 2021-08-11 | Discharge: 2021-08-18 | DRG: 522 | Disposition: A | Discharge status: Skilled Nursing Facility | Payer: Medicare Other | Attending: Internal Medicine | Admitting: Internal Medicine

## 2021-08-11 ENCOUNTER — Other Ambulatory Visit: Payer: Self-pay

## 2021-08-11 DIAGNOSIS — Z86010 Personal history of colonic polyps: Secondary | ICD-10-CM | POA: Diagnosis not present

## 2021-08-11 DIAGNOSIS — R110 Nausea: Secondary | ICD-10-CM | POA: Diagnosis not present

## 2021-08-11 DIAGNOSIS — Y9301 Activity, walking, marching and hiking: Secondary | ICD-10-CM | POA: Diagnosis present

## 2021-08-11 DIAGNOSIS — S72002A Fracture of unspecified part of neck of left femur, initial encounter for closed fracture: Principal | ICD-10-CM

## 2021-08-11 DIAGNOSIS — Z7982 Long term (current) use of aspirin: Secondary | ICD-10-CM

## 2021-08-11 DIAGNOSIS — F02818 Dementia in other diseases classified elsewhere, unspecified severity, with other behavioral disturbance: Secondary | ICD-10-CM | POA: Diagnosis present

## 2021-08-11 DIAGNOSIS — I959 Hypotension, unspecified: Secondary | ICD-10-CM | POA: Diagnosis not present

## 2021-08-11 DIAGNOSIS — F0280 Dementia in other diseases classified elsewhere without behavioral disturbance: Secondary | ICD-10-CM | POA: Diagnosis not present

## 2021-08-11 DIAGNOSIS — Z79899 Other long term (current) drug therapy: Secondary | ICD-10-CM

## 2021-08-11 DIAGNOSIS — I69351 Hemiplegia and hemiparesis following cerebral infarction affecting right dominant side: Secondary | ICD-10-CM | POA: Diagnosis not present

## 2021-08-11 DIAGNOSIS — Y92009 Unspecified place in unspecified non-institutional (private) residence as the place of occurrence of the external cause: Secondary | ICD-10-CM

## 2021-08-11 DIAGNOSIS — G4700 Insomnia, unspecified: Secondary | ICD-10-CM | POA: Diagnosis present

## 2021-08-11 DIAGNOSIS — G309 Alzheimer's disease, unspecified: Secondary | ICD-10-CM | POA: Diagnosis present

## 2021-08-11 DIAGNOSIS — Z885 Allergy status to narcotic agent status: Secondary | ICD-10-CM | POA: Diagnosis not present

## 2021-08-11 DIAGNOSIS — Z811 Family history of alcohol abuse and dependence: Secondary | ICD-10-CM | POA: Diagnosis not present

## 2021-08-11 DIAGNOSIS — Z20822 Contact with and (suspected) exposure to covid-19: Secondary | ICD-10-CM | POA: Diagnosis present

## 2021-08-11 DIAGNOSIS — M80052A Age-related osteoporosis with current pathological fracture, left femur, initial encounter for fracture: Secondary | ICD-10-CM | POA: Diagnosis present

## 2021-08-11 DIAGNOSIS — R300 Dysuria: Secondary | ICD-10-CM | POA: Diagnosis present

## 2021-08-11 DIAGNOSIS — R1310 Dysphagia, unspecified: Secondary | ICD-10-CM | POA: Diagnosis present

## 2021-08-11 DIAGNOSIS — Z8262 Family history of osteoporosis: Secondary | ICD-10-CM

## 2021-08-11 DIAGNOSIS — D649 Anemia, unspecified: Secondary | ICD-10-CM | POA: Diagnosis present

## 2021-08-11 DIAGNOSIS — Z66 Do not resuscitate: Secondary | ICD-10-CM | POA: Diagnosis present

## 2021-08-11 DIAGNOSIS — Z90710 Acquired absence of both cervix and uterus: Secondary | ICD-10-CM | POA: Diagnosis not present

## 2021-08-11 DIAGNOSIS — I10 Essential (primary) hypertension: Secondary | ICD-10-CM | POA: Diagnosis present

## 2021-08-11 DIAGNOSIS — M810 Age-related osteoporosis without current pathological fracture: Secondary | ICD-10-CM | POA: Diagnosis present

## 2021-08-11 DIAGNOSIS — I639 Cerebral infarction, unspecified: Secondary | ICD-10-CM | POA: Diagnosis present

## 2021-08-11 DIAGNOSIS — Z8249 Family history of ischemic heart disease and other diseases of the circulatory system: Secondary | ICD-10-CM

## 2021-08-11 DIAGNOSIS — S72009A Fracture of unspecified part of neck of unspecified femur, initial encounter for closed fracture: Secondary | ICD-10-CM | POA: Diagnosis present

## 2021-08-11 DIAGNOSIS — W010XXA Fall on same level from slipping, tripping and stumbling without subsequent striking against object, initial encounter: Secondary | ICD-10-CM | POA: Diagnosis present

## 2021-08-11 DIAGNOSIS — K219 Gastro-esophageal reflux disease without esophagitis: Secondary | ICD-10-CM | POA: Diagnosis present

## 2021-08-11 DIAGNOSIS — E871 Hypo-osmolality and hyponatremia: Secondary | ICD-10-CM | POA: Diagnosis not present

## 2021-08-11 LAB — PROTIME-INR
INR: 1.1 (ref 0.8–1.2)
Prothrombin Time: 14.5 seconds (ref 11.4–15.2)

## 2021-08-11 LAB — BASIC METABOLIC PANEL
Anion gap: 11 (ref 5–15)
BUN: 15 mg/dL (ref 8–23)
CO2: 22 mmol/L (ref 22–32)
Calcium: 9 mg/dL (ref 8.9–10.3)
Chloride: 101 mmol/L (ref 98–111)
Creatinine, Ser: 0.73 mg/dL (ref 0.44–1.00)
GFR, Estimated: >60 mL/min (ref >60)
Glucose, Bld: 125 mg/dL — ABNORMAL HIGH (ref 70–99)
Potassium: 4.4 mmol/L (ref 3.5–5.1)
Sodium: 134 mmol/L — ABNORMAL LOW (ref 135–145)

## 2021-08-11 LAB — SURGICAL PCR SCREEN
MRSA, PCR: NEGATIVE
Staphylococcus aureus: NEGATIVE

## 2021-08-11 LAB — CBC WITH DIFFERENTIAL/PLATELET
Abs Immature Granulocytes: 0.06 10*3/uL (ref 0.00–0.07)
Basophils Absolute: 0.1 10*3/uL (ref 0.0–0.1)
Basophils Relative: 1 %
Eosinophils Absolute: 0.1 10*3/uL (ref 0.0–0.5)
Eosinophils Relative: 1 %
HCT: 49.5 % — ABNORMAL HIGH (ref 36.0–46.0)
Hemoglobin: 16.1 g/dL — ABNORMAL HIGH (ref 12.0–15.0)
Immature Granulocytes: 1 %
Lymphocytes Relative: 10 %
Lymphs Abs: 1.2 10*3/uL (ref 0.7–4.0)
MCH: 29.3 pg (ref 26.0–34.0)
MCHC: 32.5 g/dL (ref 30.0–36.0)
MCV: 90.2 fL (ref 80.0–100.0)
Monocytes Absolute: 0.8 10*3/uL (ref 0.1–1.0)
Monocytes Relative: 7 %
Neutro Abs: 9.6 10*3/uL — ABNORMAL HIGH (ref 1.7–7.7)
Neutrophils Relative %: 80 %
Platelets: 330 10*3/uL (ref 150–400)
RBC: 5.49 MIL/uL — ABNORMAL HIGH (ref 3.87–5.11)
RDW: 14.7 % (ref 11.5–15.5)
WBC: 11.7 10*3/uL — ABNORMAL HIGH (ref 4.0–10.5)
nRBC: 0 % (ref 0.0–0.2)

## 2021-08-11 LAB — RESP PANEL BY RT-PCR (FLU A&B, COVID) ARPGX2
Influenza A by PCR: NEGATIVE
Influenza B by PCR: NEGATIVE
SARS Coronavirus 2 by RT PCR: NEGATIVE

## 2021-08-11 MED ORDER — LACTATED RINGERS IV SOLN: INTRAVENOUS | Status: DC

## 2021-08-11 MED ORDER — MORPHINE SULFATE (PF) 4 MG/ML IV SOLN
4.0000 mg | Freq: Once | INTRAVENOUS | Status: AC
  Administered 2021-08-11: 4 mg via INTRAVENOUS
  Filled 2021-08-11: qty 1

## 2021-08-11 MED ORDER — DONEPEZIL HCL 5 MG PO TABS
10.0000 mg | ORAL_TABLET | Freq: Every day | ORAL | Status: DC
  Administered 2021-08-11 – 2021-08-12 (×2): 10 mg via ORAL
  Filled 2021-08-11 (×2): qty 2

## 2021-08-11 MED ORDER — MORPHINE SULFATE (PF) 2 MG/ML IV SOLN
2.0000 mg | INTRAVENOUS | Status: DC | PRN
  Administered 2021-08-12 (×3): 2 mg via INTRAVENOUS
  Filled 2021-08-11 (×3): qty 1

## 2021-08-11 MED ORDER — ATORVASTATIN CALCIUM 20 MG PO TABS
20.0000 mg | ORAL_TABLET | Freq: Every day | ORAL | Status: DC
  Administered 2021-08-11 – 2021-08-17 (×7): 20 mg via ORAL
  Filled 2021-08-11 (×7): qty 1

## 2021-08-11 MED ORDER — DIVALPROEX SODIUM 250 MG PO DR TAB
250.0000 mg | DELAYED_RELEASE_TABLET | Freq: Two times a day (BID) | ORAL | Status: DC
  Administered 2021-08-11 – 2021-08-14 (×6): 250 mg via ORAL
  Filled 2021-08-11 (×6): qty 1

## 2021-08-11 MED ORDER — MORPHINE SULFATE (PF) 2 MG/ML IV SOLN
0.5000 mg | INTRAVENOUS | Status: DC | PRN
  Administered 2021-08-11: 0.5 mg via INTRAVENOUS
  Filled 2021-08-11: qty 1

## 2021-08-11 MED ORDER — HYDROCODONE-ACETAMINOPHEN 5-325 MG PO TABS
1.0000 | ORAL_TABLET | Freq: Four times a day (QID) | ORAL | Status: DC | PRN
  Administered 2021-08-11: 2 via ORAL
  Filled 2021-08-11: qty 2

## 2021-08-11 MED ORDER — MORPHINE SULFATE (PF) 2 MG/ML IV SOLN
2.0000 mg | Freq: Once | INTRAVENOUS | Status: DC
  Filled 2021-08-11: qty 1

## 2021-08-11 MED ORDER — MUPIROCIN 2 % EX OINT
1.0000 "application " | TOPICAL_OINTMENT | Freq: Two times a day (BID) | CUTANEOUS | Status: AC
  Administered 2021-08-11 – 2021-08-16 (×10): 1 via NASAL
  Filled 2021-08-11: qty 22

## 2021-08-11 MED ORDER — ONDANSETRON HCL 4 MG/2ML IJ SOLN
4.0000 mg | Freq: Once | INTRAMUSCULAR | Status: AC
  Administered 2021-08-11: 4 mg via INTRAVENOUS
  Filled 2021-08-11: qty 2

## 2021-08-11 MED ORDER — LORAZEPAM 2 MG/ML IJ SOLN
1.0000 mg | Freq: Once | INTRAMUSCULAR | Status: AC
  Administered 2021-08-11: 1 mg via INTRAVENOUS
  Filled 2021-08-11: qty 1

## 2021-08-11 MED ORDER — LACTATED RINGERS IV SOLN: INTRAVENOUS | Status: AC

## 2021-08-11 NOTE — ED Notes (Signed)
Transport requested

## 2021-08-11 NOTE — Consult Note (Signed)
?ORTHOPAEDIC CONSULTATION ? ?REQUESTING PHYSICIAN: Wouk, Ailene Rud, MD ? ?Chief Complaint: Left hip pain status post fall ? ?HPI: ?Barbara Vega is a 88 y.o. female who complains of left hip pain status post fall.  Patient seen in the ER with her daughter at the bedside.  Patient fell while outside with neighbors earlier today.  Patient complains of left lower extremity pain.  Patient has a history of dementia and is unable to provide a detailed history.  History is primarily obtained from the patient's daughter. ? ?Past Medical History:  ?Diagnosis Date  ? Adenomatous colon polyp   ? Anemia   ? hx  ? Arthritis   ? Benign breast lumps   ? History of colon polyps   ? benign  ? Hypertension   ? takes Triamterene-HCTZ daily  ? Insomnia   ? takes Restoril nightly  ? Macular degeneration   ? dry  ? Osteoporosis   ? Sinus infection   ? taking Ceftin daily   ? ?Past Surgical History:  ?Procedure Laterality Date  ? ABDOMINAL HYSTERECTOMY    ? BREAST EXCISIONAL BIOPSY    ? BREAST LUMPECTOMY Right 09/08/2015  ? intraductal papilloma  ? BREAST LUMPECTOMY Left   ? 2 bx done only one scar seen years ago neg  ? cataract surgery Bilateral   ? COLONOSCOPY    ? COLONOSCOPY WITH PROPOFOL N/A 11/03/2016  ? Procedure: COLONOSCOPY WITH PROPOFOL;  Surgeon: Manya Silvas, MD;  Location: [facility] ENDOSCOPY;  Service: Endoscopy;  Laterality: N/A;  ? ESOPHAGOGASTRODUODENOSCOPY (EGD) WITH PROPOFOL N/A 11/03/2016  ? Procedure: ESOPHAGOGASTRODUODENOSCOPY (EGD) WITH PROPOFOL;  Surgeon: Manya Silvas, MD;  Location: [facility] ENDOSCOPY;  Service: Endoscopy;  Laterality: N/A;  ? INGUINAL HERNIA REPAIR Left 01/15/2017  ? Procedure: HERNIA REPAIR INGUINAL ADULT;  Surgeon: Florene Glen, MD;  Location: [facility] ORS;  Service: General;  Laterality: Left;  ? KYPHOPLASTY N/A 05/15/2019  ? Procedure: KYPHOPLASTY T12;  Surgeon: Hessie Knows, MD;  Location: [facility] ORS;  Service: Orthopedics;  Laterality: N/A;  ? KYPHOPLASTY N/A 06/01/2019  ? Procedure: T11  KYPHOPLASTY;  Surgeon: Hessie Knows, MD;  Location: [facility] ORS;  Service: Orthopedics;  Laterality: N/A;  ? RADIOACTIVE SEED GUIDED EXCISIONAL BREAST BIOPSY Right 09/08/2015  ? Procedure: RADIOACTIVE SEED GUIDED EXCISIONAL BREAST BIOPSY;  Surgeon: Rolm Bookbinder, MD;  Location: [place];  Service: General;  Laterality: Right;  ? ?Social History  ? ?Socioeconomic History  ? Marital status: Divorced  ?  Spouse name: Not on file  ? Number of children: Not on file  ? Years of education: Not on file  ? Highest education level: Not on file  ?Occupational History  ? Not on file  ?Tobacco Use  ? Smoking status: Never  ? Smokeless tobacco: Never  ?Vaping Use  ? Vaping Use: Never used  ?Substance and Sexual Activity  ? Alcohol use: No  ?  Alcohol/week: 0.0 standard drinks  ? Drug use: No  ? Sexual activity: Not on file  ?Other Topics Concern  ? Not on file  ?Social History Narrative  ? Not on file  ? ?Social Determinants of Health  ? ?Financial Resource Strain: Not on file  ?Food Insecurity: Not on file  ?Transportation Needs: Not on file  ?Physical Activity: Not on file  ?Stress: Not on file  ?Social Connections: Not on file  ? ?Family History  ?Problem Relation Age of Onset  ? Osteoporosis Mother   ? Heart failure Mother   ? Heart block Brother   ?  Alcohol abuse Brother   ? Bladder Cancer Neg Hx   ? Kidney cancer Neg Hx   ? ?Allergies  ?Allergen Reactions  ? Codone [Hydrocodone] Nausea And Vomiting  ? Codeine   ? ?Prior to Admission medications   ?Medication Sig Start Date End Date Taking? Authorizing Provider  ?aspirin 81 MG EC tablet Take 81 mg by mouth daily.   Yes [provider]  ?atorvastatin (LIPITOR) 20 MG tablet Take 1 tablet by mouth daily. 04/02/21  Yes [provider]  ?divalproex (DEPAKOTE) 125 MG DR tablet Take 250-375 mg by mouth 2 (two) times daily. 03/17/21  Yes [provider]  ?donepezil (ARICEPT) 10 MG tablet Take 10 mg by mouth daily. 04/02/21  Yes [provider]  ? ?DG  Chest Portable 1 View ? ?Result Date: 08/11/2021 ?CLINICAL DATA:  Preoperative exam for hip fracture. EXAM: PORTABLE CHEST 1 VIEW COMPARISON:  05/31/2021 FINDINGS: Heart size upper limits of normal. Aortic atherosclerosis. The lungs are clear. No infiltrate, collapse or effusion. Old augmented lower thoracic compression fractures. IMPRESSION: No active disease. Electronically Signed   By: Nelson Chimes M.D.   On: 08/11/2021 14:21  ? ?DG Hip Unilat With Pelvis 2-3 Views Left ? ?Result Date: 08/11/2021 ?CLINICAL DATA:  Fall EXAM: DG HIP (WITH OR WITHOUT PELVIS) 2-3V LEFT COMPARISON:  None. FINDINGS: Fracture left femoral neck with angulation and foreshortening. Left hip joint space normal. No other fracture or bone lesion. IMPRESSION: Left femoral neck fracture. Electronically Signed   By: Franchot Gallo M.D.   On: 08/11/2021 14:05   ? ?Positive ROS: All other systems have been reviewed and were otherwise negative with the exception of those mentioned in the HPI and as above. ? ?Physical Exam: ?General: Alert, no acute distress ? ?MUSCULOSKELETAL: Left lower extremity: Patient's left lower extremity is shortened and externally rotated.  Patient's skin is intact.  There is no erythema ecchymosis or significant swelling.  Compartments of the thigh and leg are soft and compressible.  Patient has palpable pedal pulses, intact sensation light touch and intact motor function distally. ? ?Assessment: ?Displaced left femoral neck hip fracture ? ?Plan: ?I explained to the patient and her daughter the nature of her fracture.  She has a displaced left femoral neck hip fracture.  I have recommended a left hip hemiarthroplasty as treatment.  I explained the details of the operation as well as the postoperative course to the patient and her daughter. ? ?I discussed the risks and benefits of surgery. The risks include but are not limited to infection, bleeding requiring blood transfusion, nerve or blood vessel injury, joint stiffness  or loss of motion, persistent pain, weakness or instability, fracture, dislocation, hardware failure and the need for further surgery. Medical risks include but are not limited to DVT and pulmonary embolism, myocardial infarction, stroke, pneumonia, respiratory failure and death. Patient's daughter understood these risks and wished to proceed.  ? ? ? ?Thornton Park, MD ? ? ? ?08/11/2021 ?5:37 PM ? ?  ?

## 2021-08-11 NOTE — Plan of Care (Signed)

## 2021-08-11 NOTE — ED Triage Notes (Signed)
Pt comes into the ED via EMS from home with a trip and fall outside on the concrete with c/o left hip pain, denies LOC or head injury, hx of dementia, a/ox4 on arrival ? ?150/66 ?94%RA ?HR66 ?[id number] ?

## 2021-08-11 NOTE — ED Notes (Signed)
Pt family member to nurses station stating that medication has not helped with pain, pt can be heard crying out. Message sent to attending MD and order placed for additional pain medication.  ?

## 2021-08-11 NOTE — ED Notes (Signed)
Provider at bedside talking to pt and family. ?

## 2021-08-11 NOTE — H&P (Addendum)
?History and Physical ? ? ?Barbara Vega WCB:[id number] DOB: 1933/09/25 DOA: 08/11/2021 ? ?PCP: [name], MD  ?Patient coming from: home ? ? ?Chief Complaint: fall ? ?HPI: Barbara Vega is a 88 y.o. female with medical history significant for alzheimer's dementia, cva, who presents with the above. ? ?Ambulates without assistance at baseline. Was out walking in yard with neighbors earlier today when tripped and fell landing on left hip. No other trauma no LOC. Unable to ambulate after. Denies chest pain or cough or fever.  ? ?ED Course:  ? ?Hip x-ray shows left femoral neck fracture. Ortho consulted.  ? ?Review of Systems: As per HPI otherwise 10 point review of systems negative.  ? ? ?Past Medical History:  ?Diagnosis Date  ? Adenomatous colon polyp   ? Anemia   ? hx  ? Arthritis   ? Benign breast lumps   ? History of colon polyps   ? benign  ? Hypertension   ? takes Triamterene-HCTZ daily  ? Insomnia   ? takes Restoril nightly  ? Macular degeneration   ? dry  ? Osteoporosis   ? Sinus infection   ? taking Ceftin daily   ? ? ?Past Surgical History:  ?Procedure Laterality Date  ? ABDOMINAL HYSTERECTOMY    ? BREAST EXCISIONAL BIOPSY    ? BREAST LUMPECTOMY Right 09/08/2015  ? intraductal papilloma  ? BREAST LUMPECTOMY Left   ? 2 bx done only one scar seen years ago neg  ? cataract surgery Bilateral   ? COLONOSCOPY    ? COLONOSCOPY WITH PROPOFOL N/A 11/03/2016  ? Procedure: COLONOSCOPY WITH PROPOFOL;  Surgeon: Manya Silvas, MD;  Location: [facility] ENDOSCOPY;  Service: Endoscopy;  Laterality: N/A;  ? ESOPHAGOGASTRODUODENOSCOPY (EGD) WITH PROPOFOL N/A 11/03/2016  ? Procedure: ESOPHAGOGASTRODUODENOSCOPY (EGD) WITH PROPOFOL;  Surgeon: Manya Silvas, MD;  Location: [facility] ENDOSCOPY;  Service: Endoscopy;  Laterality: N/A;  ? INGUINAL HERNIA REPAIR Left 01/15/2017  ? Procedure: HERNIA REPAIR INGUINAL ADULT;  Surgeon: Florene Glen, MD;  Location: [facility] ORS;  Service: General;  Laterality: Left;  ? KYPHOPLASTY N/A  05/15/2019  ? Procedure: KYPHOPLASTY T12;  Surgeon: Hessie Knows, MD;  Location: [facility] ORS;  Service: Orthopedics;  Laterality: N/A;  ? KYPHOPLASTY N/A 06/01/2019  ? Procedure: T11 KYPHOPLASTY;  Surgeon: Hessie Knows, MD;  Location: [facility] ORS;  Service: Orthopedics;  Laterality: N/A;  ? RADIOACTIVE SEED GUIDED EXCISIONAL BREAST BIOPSY Right 09/08/2015  ? Procedure: RADIOACTIVE SEED GUIDED EXCISIONAL BREAST BIOPSY;  Surgeon: Rolm Bookbinder, MD;  Location: [place];  Service: General;  Laterality: Right;  ? ? ? reports that she has never smoked. She has never used smokeless tobacco. She reports that she does not drink alcohol and does not use drugs. ? ?Allergies  ?Allergen Reactions  ? Codone [Hydrocodone] Nausea And Vomiting  ? ? ?Family History  ?Problem Relation Age of Onset  ? Osteoporosis Mother   ? Heart failure Mother   ? Heart block Brother   ? Alcohol abuse Brother   ? Bladder Cancer Neg Hx   ? Kidney cancer Neg Hx   ? ? ?Prior to Admission medications   ?Medication Sig Start Date End Date Taking? Authorizing Provider  ?atorvastatin (LIPITOR) 20 MG tablet Take 1 tablet by mouth daily. 04/02/21   [provider]  ?divalproex (DEPAKOTE) 125 MG DR tablet Take 250-375 mg by mouth 2 (two) times daily. 03/17/21   [provider]  ?donepezil (ARICEPT) 10 MG tablet Take 10 mg by mouth daily. 04/02/21  [provider]  ? ? ?Physical Exam: ?Vitals:  ? 08/11/21 1331 08/11/21 1430 08/11/21 1530  ?BP: (!) 150/63 135/63 131/60  ?Pulse: 70 66 64  ?Resp: 18    ?Temp: 98 ?F (36.7 ?C)    ?TempSrc: Oral    ?SpO2: 94% 95% 95%  ? ? ?Constitutional: No acute distress ?Head: Atraumatic ?Eyes: Conjunctiva clear ?ENM: Moist mucous membranes. Normal dentition.  ?Neck: Supple ?Respiratory: Clear to auscultation bilaterally, no wheezing/rales/rhonchi. Normal respiratory effort. No accessory muscle use. Marland Kitchen ?Cardiovascular: Regular rate and rhythm. No murmurs/rubs/gallops. ?Abdomen: Non-tender, non-distended. No  masses. No rebound or guarding. Positive bowel sounds. ?Musculoskeletal: external rotation left leg ?Skin: No rashes, lesions, or ulcers.  ?Extremities: No peripheral edema. Palpable peripheral pulses. ?Neurologic: Alert, moving all 4 extremities. ?Psychiatric: calm ? ? ?Labs on Admission: I have personally reviewed following labs and imaging studies ? ?CBC: ?Recent Labs  ?Lab 08/11/21 ?1427  ?WBC 11.7*  ?NEUTROABS 9.6*  ?HGB 16.1*  ?HCT 49.5*  ?MCV 90.2  ?PLT 330  ? ?Basic Metabolic Panel: ?Recent Labs  ?Lab 08/11/21 ?1427  ?NA 134*  ?K 4.4  ?CL 101  ?CO2 22  ?GLUCOSE 125*  ?BUN 15  ?CREATININE 0.73  ?CALCIUM 9.0  ? ?GFR: ?CrCl cannot be calculated (Unknown ideal weight.). ?Liver Function Tests: ?No results for input(s): AST, ALT, ALKPHOS, BILITOT, PROT, ALBUMIN in the last 168 hours. ?No results for input(s): LIPASE, AMYLASE in the last 168 hours. ?No results for input(s): AMMONIA in the last 168 hours. ?Coagulation Profile: ?Recent Labs  ?Lab 08/11/21 ?1427  ?INR 1.1  ? ?Cardiac Enzymes: ?No results for input(s): CKTOTAL, CKMB, CKMBINDEX, TROPONINI in the last 168 hours. ?BNP (last 3 results) ?No results for input(s): PROBNP in the last 8760 hours. ?HbA1C: ?No results for input(s): HGBA1C in the last 72 hours. ?CBG: ?No results for input(s): GLUCAP in the last 168 hours. ?Lipid Profile: ?No results for input(s): CHOL, HDL, LDLCALC, TRIG, CHOLHDL, LDLDIRECT in the last 72 hours. ?Thyroid Function Tests: ?No results for input(s): TSH, T4TOTAL, FREET4, T3FREE, THYROIDAB in the last 72 hours. ?Anemia Panel: ?No results for input(s): VITAMINB12, FOLATE, FERRITIN, TIBC, IRON, RETICCTPCT in the last 72 hours. ?Urine analysis: ?   ?Component Value Date/Time  ? [place] YELLOW 06/01/2021 0517  ? APPEARANCEUR CLEAR 06/01/2021 0517  ? APPEARANCEUR Cloudy (A) 12/20/2016 1304  ? LABSPEC 1.015 06/01/2021 0517  ? PHURINE 6.5 06/01/2021 0517  ? GLUCOSEU NEGATIVE 06/01/2021 0517  ? HGBUR TRACE (A) 06/01/2021 0517  ? [place]  NEGATIVE 06/01/2021 0517  ? BILIRUBINUR Negative 12/20/2016 1304  ? KETONESUR 40 (A) 06/01/2021 0517  ? PROTEINUR 30 (A) 06/01/2021 0517  ? NITRITE NEGATIVE 06/01/2021 0517  ? LEUKOCYTESUR NEGATIVE 06/01/2021 0517  ? ? ?Radiological Exams on Admission: ?DG Chest Portable 1 View ? ?Result Date: 08/11/2021 ?CLINICAL DATA:  Preoperative exam for hip fracture. EXAM: PORTABLE CHEST 1 VIEW COMPARISON:  05/31/2021 FINDINGS: Heart size upper limits of normal. Aortic atherosclerosis. The lungs are clear. No infiltrate, collapse or effusion. Old augmented lower thoracic compression fractures. IMPRESSION: No active disease. Electronically Signed   By: Nelson Chimes M.D.   On: 08/11/2021 14:21  ? ?DG Hip Unilat With Pelvis 2-3 Views Left ? ?Result Date: 08/11/2021 ?CLINICAL DATA:  Fall EXAM: DG HIP (WITH OR WITHOUT PELVIS) 2-3V LEFT COMPARISON:  None. FINDINGS: Fracture left femoral neck with angulation and foreshortening. Left hip joint space normal. No other fracture or bone lesion. IMPRESSION: Left femoral neck fracture. Electronically Signed   By: Juanda Crumble  Carlis Abbott M.D.   On: 08/11/2021 14:05   ? ?EKG: Independently reviewed. nsr ? ?Assessment/Plan ?Principal Problem: ?  Hip fracture ([place]) ?Active Problems: ?  Osteoporosis, post-menopausal ?  Essential hypertension ?  Alzheimer's dementia without behavioral disturbance ([place]) ?  Stroke [facility]) ?  ?# Close traumatic left femoral neck fracture ?Mechanical fall. Osteoporotic, hx dysphagia, has had mechanical dilation in 2018. No known cardiac history, able to ambulate without chest pain or sob, do not think she needs pre-op cardiac eval ?- pain control ?- had loose bm earlier today so daughter requests holding on stool softeners ?- ortho to see ?- npo at midnight, tentative plan for surgery tomorrow ?- pt/ot consults placed, will likely need snf ?- outpt endo f/u for osteoporosis tx, bisphosphonates contraindicated ? ?# Alzheimer's dementia ?Calm ?- cont home depakote, aricept ? ?# Hx  cva ?Cont home statin ? ?DVT prophylaxis: SCDs ?Code Status: DNR  ?Family Communication: daughter updated @ bedside  ?Consults called: ortho  ? ?Level of care: Med-Surg ?Status is: Inpatient ?Remains inpatien

## 2021-08-11 NOTE — ED Notes (Signed)
RN was called into pt room by family. Per family pt is having a really hard time breathing. RN to notify provider. ?

## 2021-08-11 NOTE — ED Provider Notes (Signed)
? ?[facility] ?Provider Note ? ? ? Event Date/Time  ? First MD Initiated Contact with Patient 08/11/21 1357   ?  (approximate) ? ? ?History  ? ?Fall ? ? ?HPI ? ?Barbara Vega is a 88 y.o. female with a past medical history of GERD, HDL, CVA without significant residual deficits, hiatal hernia, HTN and Alzheimer's dementia although fairly functional at baseline and normally able to ambulate who presents for evaluation of fall.  This occurred earlier today was witnessed by neighbors she was walking with.  She fell onto her left hip.  Per witnesses she did not hit her head and patient denies hitting her head or anywhere else.  She is only complaining of pain in the left hip.  Denies any other pain or recent sick symptoms including fevers, chills, cough, chest pain abdominal pain nausea or vomiting.  No other recent injuries.  She is only on baby ASA daily but no other anticoagulation.  No other acute concerns at this time per family at bedside. ? ?  ? ? ?Physical Exam  ?Triage Vital Signs: ?ED Triage Vitals [08/11/21 1331]  ?Enc Vitals Group  ?   BP (!) 150/63  ?   Pulse Rate 70  ?   Resp 18  ?   Temp 98 ?F (36.7 ?C)  ?   Temp Source Oral  ?   SpO2 94 %  ?   Weight   ?   Height   ?   Head Circumference   ?   Peak Flow   ?   Pain Score 10  ?   Pain Loc   ?   Pain Edu?   ?   Excl. in [place]?   ? ? ?Most recent vital signs: ?Vitals:  ? 08/11/21 1331 08/11/21 1430  ?BP: (!) 150/63 135/63  ?Pulse: 70 66  ?Resp: 18   ?Temp: 98 ?F (36.7 ?C)   ?SpO2: 94% 95%  ? ? ?General: Awake, seems very uncomfortable. ?CV:  Good peripheral perfusion.  2+ radial and DP pulses. ?Resp:  Normal effort.  Clear bilaterally. ?Abd:  No distention.  Soft. ?Other:  Patient's left lower extremity is externally rotated and shortened.  She is able to move the left hip.  No tenderness over the knee or left ankle and she is able move her toes on the left foot.  He states he cannot move her left knee secondary to pain in left hip.  She  has full strength of the right lower extremity and bilateral upper extremities.  No obvious trauma to the face scalp head or neck or tenderness along the spine. ? ? ?ED Results / Procedures / Treatments  ?Labs ?(all labs ordered are listed, but only abnormal results are displayed) ?Labs Reviewed  ?CBC WITH DIFFERENTIAL/PLATELET - Abnormal; Notable for the following components:  ?    Result Value  ? WBC 11.7 (*)   ? RBC 5.49 (*)   ? Hemoglobin 16.1 (*)   ? HCT 49.5 (*)   ? Neutro Abs 9.6 (*)   ? All other components within normal limits  ?BASIC METABOLIC PANEL - Abnormal; Notable for the following components:  ? Sodium 134 (*)   ? Glucose, Bld 125 (*)   ? All other components within normal limits  ?RESP PANEL BY RT-PCR (FLU A&B, COVID) [id number]  ?PROTIME-INR  ? ? ? ?EKG ? ? ?RADIOLOGY ? ?Chest reviewed by myself shows no focal consoidation, effusion, edema, pneumothorax or other clear acute thoracic  process. I also reviewed radiology interpretation and agree with findings described. ? ?X-ray left hip on my interpretation shows a left femoral neck fracture without any other acute process.  I also reviewed radiology interpretation and agree with the findings of same. ? ? ?PROCEDURES: ? ?Critical Care performed: No ? ?Procedures ? ? ?MEDICATIONS ORDERED IN ED: ?Medications  ?lactated ringers infusion (has no administration in time range)  ?morphine (PF) 4 MG/ML injection 4 mg (4 mg Intravenous Given 08/11/21 1424)  ?ondansetron (ZOFRAN) injection 4 mg (4 mg Intravenous Given 08/11/21 1424)  ? ? ? ?IMPRESSION / MDM / ASSESSMENT AND PLAN / ED COURSE  ?I reviewed the triage vital signs and the nursing notes. ?             ?               ? ?Differential diagnosis includes, but is not limited to contusion versus hematoma versus underlying hip fracture in the left hip.  She denies any other pain is no other areas of tenderness to suggest any other significant injury.  This was witnessed patient not hit her head nevertheless  patient significant cranial injury.  Patient is only on ASA and otherwise not anticoagulated.  She is neurovascular intact in the left lower extremity. ? ?Chest (obtained for pre-op purposes) reviewed by myself shows no focal consoidation, effusion, edema, pneumothorax or other clear acute thoracic process. I also reviewed radiology interpretation and agree with findings described. ? ?X-ray left hip on my interpretation shows a left femoral neck fracture without any other acute process.  I also reviewed radiology interpretation and agree with the findings of same. ? ?CBC shows WBC count of 11.7 I suspect reactive in the setting of trauma with hemoglobin of 16.1 and normal platelets.  BMP shows no significant electrolyte or metabolic derangements.  INR is 1.1. ? ?Given concern for acute left femoral neck fracture I consulted with on-call orthopedist Dr. Mack Guise who recommended hospitalist admission with likely plan for operative repair tomorrow. ? ?I will plan to admit to medicine service for further evaluation and management. ? ?  ? ? ?FINAL CLINICAL IMPRESSION(S) / ED DIAGNOSES  ? ?Final diagnoses:  ?Closed fracture of left hip, initial encounter ([place])  ? ? ? ?Rx / DC Orders  ? ?ED Discharge Orders   ? ? None  ? ?  ? ? ? ?Note:  This document was prepared using Dragon voice recognition software and may include unintentional dictation errors. ?  ?Lucrezia Starch, MD ?08/11/21 1528 ? ?

## 2021-08-11 NOTE — Progress Notes (Incomplete)
? ?      CROSS COVER NOTE ? ?NAME: Barbara Vega ?MRN: [id number] ?DOB : 1933-08-05 ? ? ?-CXR ?-Ativan ?

## 2021-08-12 ENCOUNTER — Inpatient Hospital Stay: Payer: Medicare Other

## 2021-08-12 ENCOUNTER — Encounter: Payer: Self-pay | Admitting: Obstetrics and Gynecology

## 2021-08-12 ENCOUNTER — Encounter
Admission: EM | Disposition: A | Discharge status: Skilled Nursing Facility | Payer: Self-pay | Source: Home / Self Care | Attending: Obstetrics and Gynecology

## 2021-08-12 ENCOUNTER — Inpatient Hospital Stay: Payer: Medicare Other | Admitting: Anesthesiology

## 2021-08-12 DIAGNOSIS — S72002A Fracture of unspecified part of neck of left femur, initial encounter for closed fracture: Secondary | ICD-10-CM | POA: Diagnosis not present

## 2021-08-12 HISTORY — PX: HIP ARTHROPLASTY: SHX981

## 2021-08-12 LAB — CBC
HCT: 43.8 % (ref 36.0–46.0)
Hemoglobin: 14.6 g/dL (ref 12.0–15.0)
MCH: 29.2 pg (ref 26.0–34.0)
MCHC: 33.3 g/dL (ref 30.0–36.0)
MCV: 87.6 fL (ref 80.0–100.0)
Platelets: 317 10*3/uL (ref 150–400)
RBC: 5 MIL/uL (ref 3.87–5.11)
RDW: 14.7 % (ref 11.5–15.5)
WBC: 10.7 10*3/uL — ABNORMAL HIGH (ref 4.0–10.5)
nRBC: 0 % (ref 0.0–0.2)

## 2021-08-12 LAB — TYPE AND SCREEN
ABO/RH(D): A POS
Antibody Screen: NEGATIVE

## 2021-08-12 LAB — CREATININE, SERUM
Creatinine, Ser: 0.53 mg/dL (ref 0.44–1.00)
GFR, Estimated: >60 mL/min (ref >60)

## 2021-08-12 SURGERY — HEMIARTHROPLASTY, HIP, DIRECT ANTERIOR APPROACH, FOR FRACTURE
Anesthesia: Spinal | Site: Hip | Laterality: Left

## 2021-08-12 MED ORDER — SODIUM CHLORIDE 0.9 % IV SOLN
75.0000 mL/h | INTRAVENOUS | Status: DC
  Administered 2021-08-12: 75 mL/h via INTRAVENOUS

## 2021-08-12 MED ORDER — FENTANYL CITRATE (PF) 100 MCG/2ML IJ SOLN
INTRAMUSCULAR | Status: DC | PRN
  Administered 2021-08-12: 25 ug via INTRAVENOUS

## 2021-08-12 MED ORDER — ACETAMINOPHEN 10 MG/ML IV SOLN: 1000.0000 mg | Freq: Once | INTRAVENOUS | Status: DC | PRN

## 2021-08-12 MED ORDER — SODIUM CHLORIDE 0.9 % IR SOLN
Status: DC | PRN
  Administered 2021-08-12: 1004 mL

## 2021-08-12 MED ORDER — ACETAMINOPHEN 325 MG PO TABS
325.0000 mg | ORAL_TABLET | Freq: Four times a day (QID) | ORAL | Status: DC | PRN
  Administered 2021-08-13 – 2021-08-16 (×5): 650 mg via ORAL
  Filled 2021-08-12 (×7): qty 2

## 2021-08-12 MED ORDER — BISACODYL 10 MG RE SUPP: 10.0000 mg | Freq: Every day | RECTAL | Status: DC | PRN

## 2021-08-12 MED ORDER — CEFAZOLIN SODIUM-DEXTROSE 2-4 GM/100ML-% IV SOLN
2.0000 g | Freq: Four times a day (QID) | INTRAVENOUS | Status: AC
  Administered 2021-08-12 – 2021-08-13 (×2): 2 g via INTRAVENOUS
  Filled 2021-08-12 (×2): qty 100

## 2021-08-12 MED ORDER — SENNA 8.6 MG PO TABS
1.0000 | ORAL_TABLET | Freq: Two times a day (BID) | ORAL | Status: DC
  Administered 2021-08-12 – 2021-08-17 (×9): 8.6 mg via ORAL
  Filled 2021-08-12 (×9): qty 1

## 2021-08-12 MED ORDER — ONDANSETRON HCL 4 MG PO TABS: 4.0000 mg | ORAL_TABLET | Freq: Four times a day (QID) | ORAL | Status: DC | PRN

## 2021-08-12 MED ORDER — STERILE WATER FOR IRRIGATION IR SOLN
Status: DC | PRN
  Administered 2021-08-12: 1000 mL

## 2021-08-12 MED ORDER — FENTANYL CITRATE (PF) 100 MCG/2ML IJ SOLN
INTRAMUSCULAR | Status: AC
  Administered 2021-08-12: 25 ug via INTRAVENOUS
  Filled 2021-08-12: qty 2

## 2021-08-12 MED ORDER — PROPOFOL 1000 MG/100ML IV EMUL
INTRAVENOUS | Status: AC
  Filled 2021-08-12: qty 100

## 2021-08-12 MED ORDER — CEFAZOLIN SODIUM-DEXTROSE 2-4 GM/100ML-% IV SOLN
2.0000 g | INTRAVENOUS | Status: AC
  Administered 2021-08-12: 2 g via INTRAVENOUS

## 2021-08-12 MED ORDER — TRAMADOL HCL 50 MG PO TABS
50.0000 mg | ORAL_TABLET | Freq: Four times a day (QID) | ORAL | Status: DC
  Administered 2021-08-12 – 2021-08-13 (×2): 50 mg via ORAL
  Filled 2021-08-12 (×2): qty 1

## 2021-08-12 MED ORDER — KETAMINE HCL 50 MG/5ML IJ SOSY
PREFILLED_SYRINGE | INTRAMUSCULAR | Status: AC
  Filled 2021-08-12: qty 5

## 2021-08-12 MED ORDER — ONDANSETRON HCL 4 MG/2ML IJ SOLN
4.0000 mg | Freq: Four times a day (QID) | INTRAMUSCULAR | Status: DC | PRN
  Administered 2021-08-13 (×2): 4 mg via INTRAVENOUS
  Filled 2021-08-12 (×2): qty 2

## 2021-08-12 MED ORDER — FENTANYL CITRATE (PF) 100 MCG/2ML IJ SOLN
25.0000 ug | INTRAMUSCULAR | Status: DC | PRN
  Administered 2021-08-12: 25 ug via INTRAVENOUS

## 2021-08-12 MED ORDER — LACTATED RINGERS IV SOLN: INTRAVENOUS | Status: DC

## 2021-08-12 MED ORDER — METHOCARBAMOL 1000 MG/10ML IJ SOLN
500.0000 mg | Freq: Four times a day (QID) | INTRAVENOUS | Status: DC | PRN
  Filled 2021-08-12: qty 5

## 2021-08-12 MED ORDER — NEOMYCIN-POLYMYXIN B GU 40-200000 IR SOLN
Status: DC | PRN
Start: 2021-08-12 — End: 2021-08-12
  Administered 2021-08-12: 2500 mL

## 2021-08-12 MED ORDER — MORPHINE SULFATE (PF) 2 MG/ML IV SOLN
INTRAVENOUS | Status: AC
  Administered 2021-08-12: 0.5 mg via INTRAVENOUS
  Filled 2021-08-12: qty 1

## 2021-08-12 MED ORDER — PHENYLEPHRINE HCL-NACL 20-0.9 MG/250ML-% IV SOLN
INTRAVENOUS | Status: DC | PRN
  Administered 2021-08-12: 45 ug/min via INTRAVENOUS

## 2021-08-12 MED ORDER — CEFAZOLIN SODIUM-DEXTROSE 2-4 GM/100ML-% IV SOLN
INTRAVENOUS | Status: AC
  Filled 2021-08-12: qty 100

## 2021-08-12 MED ORDER — LIDOCAINE HCL (PF) 2 % IJ SOLN
INTRAMUSCULAR | Status: AC
  Filled 2021-08-12: qty 5

## 2021-08-12 MED ORDER — DOCUSATE SODIUM 100 MG PO CAPS
100.0000 mg | ORAL_CAPSULE | Freq: Two times a day (BID) | ORAL | Status: DC
  Administered 2021-08-12 – 2021-08-17 (×7): 100 mg via ORAL
  Filled 2021-08-12 (×9): qty 1

## 2021-08-12 MED ORDER — ACETAMINOPHEN 500 MG PO TABS
500.0000 mg | ORAL_TABLET | Freq: Four times a day (QID) | ORAL | Status: AC
  Administered 2021-08-13 (×2): 500 mg via ORAL
  Filled 2021-08-12 (×3): qty 1

## 2021-08-12 MED ORDER — GLYCOPYRROLATE 0.2 MG/ML IJ SOLN
INTRAMUSCULAR | Status: DC | PRN
  Administered 2021-08-12: .2 mg via INTRAVENOUS

## 2021-08-12 MED ORDER — PANTOPRAZOLE SODIUM 20 MG PO TBEC
20.0000 mg | DELAYED_RELEASE_TABLET | Freq: Every day | ORAL | Status: DC
  Administered 2021-08-13 – 2021-08-18 (×6): 20 mg via ORAL
  Filled 2021-08-12 (×6): qty 1

## 2021-08-12 MED ORDER — PROPOFOL 10 MG/ML IV BOLUS
INTRAVENOUS | Status: DC | PRN
  Administered 2021-08-12: 30 mg via INTRAVENOUS
  Administered 2021-08-12 (×2): 20 mg via INTRAVENOUS
  Administered 2021-08-12: 30 mg via INTRAVENOUS

## 2021-08-12 MED ORDER — BUPIVACAINE HCL (PF) 0.5 % IJ SOLN
INTRAMUSCULAR | Status: DC | PRN
  Administered 2021-08-12: 2.5 mL via INTRATHECAL

## 2021-08-12 MED ORDER — HYDROCODONE-ACETAMINOPHEN 5-325 MG PO TABS
1.0000 | ORAL_TABLET | ORAL | Status: DC | PRN
  Administered 2021-08-13: 1 via ORAL
  Administered 2021-08-13: 2 via ORAL
  Administered 2021-08-16 (×5): 1 via ORAL
  Administered 2021-08-17: 2 via ORAL
  Administered 2021-08-17: 1 via ORAL
  Administered 2021-08-17 – 2021-08-18 (×2): 2 via ORAL
  Filled 2021-08-12 (×3): qty 1
  Filled 2021-08-12: qty 2
  Filled 2021-08-12 (×2): qty 1
  Filled 2021-08-12 (×2): qty 2
  Filled 2021-08-12 (×2): qty 1
  Filled 2021-08-12: qty 2

## 2021-08-12 MED ORDER — PROPOFOL 500 MG/50ML IV EMUL
INTRAVENOUS | Status: DC | PRN
  Administered 2021-08-12: 90 ug/kg/min via INTRAVENOUS

## 2021-08-12 MED ORDER — EPHEDRINE 5 MG/ML INJ
INTRAVENOUS | Status: AC
  Filled 2021-08-12: qty 5

## 2021-08-12 MED ORDER — ONDANSETRON HCL 4 MG/2ML IJ SOLN
INTRAMUSCULAR | Status: DC | PRN
  Administered 2021-08-12: 4 mg via INTRAVENOUS

## 2021-08-12 MED ORDER — FENTANYL CITRATE (PF) 100 MCG/2ML IJ SOLN
INTRAMUSCULAR | Status: AC
  Filled 2021-08-12: qty 2

## 2021-08-12 MED ORDER — POLYETHYLENE GLYCOL 3350 17 G PO PACK
17.0000 g | PACK | Freq: Every day | ORAL | Status: DC | PRN
  Administered 2021-08-14 – 2021-08-18 (×2): 17 g via ORAL
  Filled 2021-08-12 (×2): qty 1

## 2021-08-12 MED ORDER — ACETAMINOPHEN 10 MG/ML IV SOLN
INTRAVENOUS | Status: AC
  Administered 2021-08-12: 1000 mg via INTRAVENOUS
  Filled 2021-08-12: qty 100

## 2021-08-12 MED ORDER — EPHEDRINE SULFATE (PRESSORS) 50 MG/ML IJ SOLN
INTRAMUSCULAR | Status: DC | PRN
  Administered 2021-08-12: 5 mg via INTRAVENOUS

## 2021-08-12 MED ORDER — ENOXAPARIN SODIUM 40 MG/0.4ML IJ SOSY
40.0000 mg | PREFILLED_SYRINGE | INTRAMUSCULAR | Status: DC
  Administered 2021-08-13 – 2021-08-15 (×3): 40 mg via SUBCUTANEOUS
  Filled 2021-08-12 (×3): qty 0.4

## 2021-08-12 MED ORDER — ALUM & MAG HYDROXIDE-SIMETH 200-200-20 MG/5ML PO SUSP
ORAL | Status: AC
  Filled 2021-08-12: qty 30

## 2021-08-12 MED ORDER — ASPIRIN EC 81 MG PO TBEC
81.0000 mg | DELAYED_RELEASE_TABLET | Freq: Every day | ORAL | Status: DC
  Administered 2021-08-13 – 2021-08-15 (×3): 81 mg via ORAL
  Filled 2021-08-12 (×3): qty 1

## 2021-08-12 MED ORDER — MORPHINE SULFATE (PF) 2 MG/ML IV SOLN
0.5000 mg | INTRAVENOUS | Status: DC | PRN
  Administered 2021-08-12: 0.5 mg via INTRAVENOUS
  Administered 2021-08-17 – 2021-08-18 (×2): 1 mg via INTRAVENOUS
  Filled 2021-08-12 (×2): qty 1

## 2021-08-12 MED ORDER — METHOCARBAMOL 500 MG PO TABS
500.0000 mg | ORAL_TABLET | Freq: Four times a day (QID) | ORAL | Status: DC | PRN
  Administered 2021-08-13 – 2021-08-16 (×4): 500 mg via ORAL
  Filled 2021-08-12 (×5): qty 1

## 2021-08-12 MED ORDER — ALUM & MAG HYDROXIDE-SIMETH 200-200-20 MG/5ML PO SUSP
30.0000 mL | ORAL | Status: DC | PRN
  Administered 2021-08-12 – 2021-08-18 (×12): 30 mL via ORAL
  Filled 2021-08-12 (×11): qty 30

## 2021-08-12 MED ORDER — ONDANSETRON HCL 4 MG/2ML IJ SOLN
INTRAMUSCULAR | Status: AC
  Filled 2021-08-12: qty 2

## 2021-08-12 MED ORDER — NEOMYCIN-POLYMYXIN B GU 40-200000 IR SOLN
Status: AC
  Filled 2021-08-12: qty 20

## 2021-08-12 SURGICAL SUPPLY — 57 items
BLADE SAGITTAL WIDE XTHICK NO (BLADE) ×2 IMPLANT
BLADE SURG SZ10 CARB STEEL (BLADE) ×2 IMPLANT
BNDG COHESIVE 4X5 TAN ST LF (GAUZE/BANDAGES/DRESSINGS) ×2 IMPLANT
COVER BACK TABLE REUSABLE LG (DRAPES) ×2 IMPLANT
DRAPE 3/4 80X56 (DRAPES) ×4 IMPLANT
DRAPE INCISE IOBAN 66X60 STRL (DRAPES) ×2 IMPLANT
DRAPE ORTHO SPLIT 77X108 STRL (DRAPES) ×2
DRAPE SURG 17X11 SM STRL (DRAPES) ×2 IMPLANT
DRAPE SURG ORHT 6 SPLT 77X108 (DRAPES) ×2 IMPLANT
DRSG AQUACEL AG ADV 3.5X14 (GAUZE/BANDAGES/DRESSINGS) ×1 IMPLANT
DRSG OPSITE POSTOP 4X10 (GAUZE/BANDAGES/DRESSINGS) ×1 IMPLANT
DURAPREP 26ML APPLICATOR (WOUND CARE) ×8 IMPLANT
ELECT CAUTERY BLADE 6.4 (BLADE) ×2 IMPLANT
ELECT REM PT RETURN 9FT ADLT (ELECTROSURGICAL) ×2
ELECTRODE REM PT RTRN 9FT ADLT (ELECTROSURGICAL) ×1 IMPLANT
GAUZE 4X4 16PLY ~~LOC~~+RFID DBL (SPONGE) ×2 IMPLANT
GAUZE SPONGE 4X4 12PLY STRL (GAUZE/BANDAGES/DRESSINGS) ×1 IMPLANT
GAUZE XEROFORM 1X8 LF (GAUZE/BANDAGES/DRESSINGS) ×4 IMPLANT
GLOVE SURG ORTHO LTX SZ9 (GLOVE) ×4 IMPLANT
GLOVE SURG UNDER POLY LF SZ9 (GLOVE) ×2 IMPLANT
GOWN STRL REUS TWL 2XL XL LVL4 (GOWN DISPOSABLE) ×2 IMPLANT
GOWN STRL REUS W/ TWL LRG LVL3 (GOWN DISPOSABLE) ×1 IMPLANT
GOWN STRL REUS W/TWL LRG LVL3 (GOWN DISPOSABLE) ×1
HEAD MODULAR ENDO (Orthopedic Implant) ×1 IMPLANT
HEAD UNPLR 46XMDLR STRL HIP (Orthopedic Implant) IMPLANT
HEMOVAC 400ML (MISCELLANEOUS) ×2
HOLSTER ELECTROSUGICAL PENCIL (MISCELLANEOUS) ×2 IMPLANT
IV NS IRRIG 3000ML ARTHROMATIC (IV SOLUTION) ×2 IMPLANT
KIT DRAIN HEMOVAC JP 7FR 400ML (MISCELLANEOUS) ×1 IMPLANT
KIT TURNOVER KIT A (KITS) ×2 IMPLANT
MANIFOLD NEPTUNE II (INSTRUMENTS) ×3 IMPLANT
NDL FILTER BLUNT 18X1 1/2 (NEEDLE) ×1 IMPLANT
NDL MAYO CATGUT SZ4 TPR NDL (NEEDLE) ×1 IMPLANT
NDL SAFETY ECLIPSE 18X1.5 (NEEDLE) ×1 IMPLANT
NEEDLE FILTER BLUNT 18X 1/2SAF (NEEDLE) ×1
NEEDLE FILTER BLUNT 18X1 1/2 (NEEDLE) ×1 IMPLANT
NEEDLE HYPO 18GX1.5 SHARP (NEEDLE) ×1
NEEDLE MAYO CATGUT SZ4 (NEEDLE) ×2 IMPLANT
NS IRRIG 1000ML POUR BTL (IV SOLUTION) ×2 IMPLANT
PACK HIP PROSTHESIS (MISCELLANEOUS) ×2 IMPLANT
PILLOW ABDUCTION FOAM SM (MISCELLANEOUS) ×2 IMPLANT
PULSAVAC PLUS IRRIG FAN TIP (DISPOSABLE) ×2
RETRIEVER SUT HEWSON (MISCELLANEOUS) ×1 IMPLANT
SLEEVE UNITRAX V40 STD (Orthopedic Implant) ×1 IMPLANT
SPONGE T-LAP 18X18 ~~LOC~~+RFID (SPONGE) ×8 IMPLANT
STAPLER SKIN PROX 35W (STAPLE) ×2 IMPLANT
STEM HIP 127 DEG (Stem) ×1 IMPLANT
SUT TICRON 2-0 30IN 311381 (SUTURE) ×8 IMPLANT
SUT VIC AB 0 CT1 36 (SUTURE) ×3 IMPLANT
SUT VIC AB 2-0 CT2 27 (SUTURE) ×5 IMPLANT
SYR 10ML LL (SYRINGE) ×2 IMPLANT
TAPE MICROFOAM 4IN (TAPE) ×1 IMPLANT
TAPE TRANSPORE STRL 2 31045 (GAUZE/BANDAGES/DRESSINGS) ×1 IMPLANT
TIP BRUSH PULSAVAC PLUS 24.33 (MISCELLANEOUS) ×2 IMPLANT
TIP FAN IRRIG PULSAVAC PLUS (DISPOSABLE) ×1 IMPLANT
TUBE SUCT KAM VAC (TUBING) ×1 IMPLANT
WATER STERILE IRR 500ML POUR (IV SOLUTION) ×2 IMPLANT

## 2021-08-12 NOTE — Anesthesia Postprocedure Evaluation (Signed)
Anesthesia Post Note ? ?Patient: Barbara Vega ? ?Procedure(s) Performed: ARTHROPLASTY BIPOLAR HIP (HEMIARTHROPLASTY) (Left: Hip) ? ?Patient location during evaluation: PACU ?Anesthesia Type: Spinal ?Level of consciousness: awake and alert and patient cooperative ?Pain management: pain level controlled ?Vital Signs Assessment: post-procedure vital signs reviewed and stable ?Respiratory status: spontaneous breathing, nonlabored ventilation and respiratory function stable ?Cardiovascular status: blood pressure returned to baseline and stable ?Postop Assessment: adequate PO intake ?Anesthetic complications: no ? ? ?No notable events documented. ? ? ?Last Vitals:  ?Vitals:  ? 08/12/21 1845 08/12/21 1915  ?BP: 119/78 (!) 105/52  ?Pulse: (!) 110 99  ?Resp: 18 18  ?Temp:  36.6 ?C  ?SpO2: 98% 99%  ?  ?Last Pain:  ?Vitals:  ? 08/12/21 1915  ?TempSrc:   ?PainSc: 6   ? ? ?  ?  ?  ?  ?  ?  ? ?Darrin Nipper ? ? ? ? ?

## 2021-08-12 NOTE — Op Note (Signed)
08/12/2021 ? ?6:21 PM ? ?PATIENT:  Barbara Vega  ? ?MRN: [id number] ? ?PRE-OPERATIVE DIAGNOSIS:  left displaced femoral neck hip fracture ? ?POST-OPERATIVE DIAGNOSIS: Same ? ?PROCEDURE:  Procedure(s): ?Left hip hemiarthroplasty ? ?PREOPERATIVE INDICATIONS:   ? ?Barbara Vega is an 88 y.o. female who was admitted with a diagnosis of left hip fracture.  I have recommended a hemiarthroplasty for this definitive treatment of this fracture. I have explained the surgery and the postoperative course to the patient and her daughter who have agreed with surgical management of this fracture.   ? ?The risks benefits and alternatives were discussed with the patient and their family including but not limited to the risks of  infection requiring removal of the prosthesis, bleeding requiring blood transfusion, nerve injury especially to the sciatic nerve leading to foot drop or lower extremity numbness, periprosthetic fracture, dislocation leg length discrepancy, change in lower extremity rotation persistent hip pain, loosening or failure of the components and the need for revision surgery. Medical risks include but are not limited to DVT and pulmonary embolism, myocardial infarction, stroke, pneumonia, respiratory failure and death. ? ?OPERATIVE REPORT  ?   ?SURGEON:  Thornton Park, MD ?   ?ASSISTANT: Surgical tech ?   ?ANESTHESIA: Spinal ?   ?COMPLICATIONS:  None.  ? ?SPECIMEN: Femoral head to pathology ?   ?COMPONENTS:  [employer]ker Accolade 2 femoral component size 5 with a 46 mm +0 neck adjustment sleeve. ?   ?PROCEDURE IN DETAIL:  ? ?The patient was met in the holding area and identified with her family at the bedside.  The left hip was identified and marked at the operative site after verbally confirming with the patient that this was the correct site of surgery.  The patient was then transported to the OR  and  underwent placement of spinal anesthesia.  The patient was then placed in the lateral decubitus position with the  operative side up and secured on the operating room table with a pegboard and all bony prominences were adequately padded. This included an axillary roll and additional padding around the nonoperative leg to prevent compression to the common peroneal nerve. ?   ?The operative lower extremity was prepped and draped in a sterile fashion.  A time out was performed prior to incision to verify patient's name, date of birth, medical record number, correct site of surgery correct procedure to be performed. The timeout was also used to verify the patient received antibiotics now appropriate instruments, implants and radiographic studies were available in the room. Once all in attendance were in agreement case began. ?   ?A posterolateral approach was utilized via sharp dissection  carried down to the subcutaneous tissue.  Bleeding vessels were coagulated using electrocautery.  The fascia lata was identified and incised along the length of the skin incision.  The gluteus maximus muscle was then split in line with its fibers. Self-retaining retractors were  inserted.  With the hip internally rotated, the short external rotators  were identified and removed from the posterior attachment from the greater trochanter. The piriformis was tagged for later repair. The capsule was identified and a T-shaped capsulotomy was performed. The capsule was tagged with #2 Tycron for later repair. ? ?The femoral neck fracture was exposed, and the femoral head was removed using a corkscrew device. This was measured to be 46 mm in diameter. The attention was then turned to proximal femur preparation.  An oscillating saw was used to perform a proximal femoral osteotomy 1  fingerbreadth above the lesser trochanter. The trial 46 mm femoral head was placed into the acetabulum and had an excellent suction fit. The attention was then turned back to femoral preparation. ?   ?A femoral skid and Cobra retractor were placed under the femoral neck to allow  for adequate visualization. A box osteotome was used to make the initial entry into the proximal femur. A single hand reamer was used to prepare the femoral canal. A T-shaped femoral canal sounder was then used to ensure no penetration femoral cortex had occurred during reaming. The proximal femur was then sequentially broached by hand. A size 5 femoral trial broach was found to have best medial to lateral canal fit. Once adequate mediolateral canal fill was achieved the trial femoral broach, neck, and head was assembled and the hip was reduced. It was found to have excellent stability, equivalent leg lengths with functional range of motion. The trial components were then removed. ? ?I copiously irrigated the femoral canal and then impacted the real femoral prosthesis into place into the appropriate version, slightly anteverted to the normal anatomy, and I impacted the actual 46 mm Unitrax femoral component with a +0 neck adjustment sleeve into place. The hip was then reduced and taken through functional range of motion and found to have excellent stability. Leg lengths were restored. The hip joint was copiously irrigated.  ? ?A soft tissue repair of the capsule and external rotators was performed using #2 Tycron Excellent posterior capsular repair was achieved. The fascia lata was then closed with interrupted 0 Vicryl suture. The subcutaneous tissues were closed with 2-0 Vicryl and the skin approximated with staples.  ? ?The patient was then placed supine on the operative table. Leg lengths were checked clinically and found to be equivalent. An abduction pillow was placed between the lower extremities. The patient was then transferred to a hospital bed and brought to the PACU in stable condition. I was scrubbed and present the entire case and all sharp and instrument counts were correct at the conclusion of the case. I spoke with the patient's daughter by phone from the PACU to let her know the case was completed  without complication patient was stable in recovery room. ? ? ?Barbara Gaul, MD ?Orthopedic Surgeon  ?

## 2021-08-12 NOTE — Anesthesia Procedure Notes (Signed)
Spinal ? ?Patient location during procedure: OR ?Start time: 08/12/2021 4:18 PM ?End time: 08/12/2021 4:25 PM ?Reason for block: surgical anesthesia ?Staffing ?Performed: anesthesiologist  ?Anesthesiologist: Darrin Nipper, MD ?Resident/CRNA: Loletha Grayer, CRNA ?Preanesthetic Checklist ?Completed: patient identified, IV checked, site marked, risks and benefits discussed, surgical consent, monitors and equipment checked, pre-op evaluation and timeout performed ?Spinal Block ?Patient position: right lateral decubitus ?Prep: ChloraPrep ?Patient monitoring: cardiac monitor, continuous pulse ox and blood pressure ?Approach: midline ?Location: L2-3 ?Injection technique: single-shot ?Needle ?Needle type: Sprotte and Whitacre  ?Needle gauge: 22 G ?Needle length: 9 cm ?Assessment ?Sensory level: T4 ?Events: CSF return ?Additional Notes ?Difficult placement 2/2 os encountered at most passes.  Attempts by both Mosetta Pigeon and Landi Biscardi.  Ultimately placed midline at L2-3 by [facility]. ? ? ? ?

## 2021-08-12 NOTE — Anesthesia Preprocedure Evaluation (Addendum)
Anesthesia Evaluation  ?Patient identified by MRN, date of birth, ID band ?Patient awake ? ? ? ?Reviewed: ?Allergy & Precautions, NPO status , Patient's Chart, lab work & pertinent test results ? ?History of Anesthesia Complications ?Negative for: history of anesthetic complications ? ?Airway ?Mallampati: II ? ?TM Distance: >3 FB ?Neck ROM: Full ? ? ? Dental ? ?(+) Poor Dentition, Missing, Upper Dentures, Partial Lower ?  ?Pulmonary ?neg pulmonary ROS,  ?  ?Pulmonary exam normal ?breath sounds clear to auscultation ? ? ? ? ? ? Cardiovascular ?Exercise Tolerance: Poor ?hypertension, Normal cardiovascular exam ?Rhythm:Regular Rate:Normal ? ?ECG 08/11/21:  ?Sinus rhythm ?Right axis deviation ?  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 GI/Hepatic ?Neg liver ROS, hiatal hernia (small in 2018), GERD  Controlled,dysphagia ?  ?Endo/Other  ?negative endocrine ROS ? Renal/GU ?negative Renal ROS  ? ?  ?Musculoskeletal ? ?(+) Arthritis , left hip fracture  ? Abdominal ?  ?Peds ? Hematology ? ?(+) Blood dyscrasia, anemia ,   ?Anesthesia Other Findings ?Hyponatremia ? ? Reproductive/Obstetrics ? ?  ? ? ? ? ? ? ? ? ? ? ? ? ? ?  ?  ? ? ? ? ? ?Anesthesia Physical ? ?Anesthesia Plan ? ?ASA: 3 ? ?Anesthesia Plan: Spinal and General  ? ?Post-op Pain Management:   ? ?Induction: Intravenous ? ?PONV Risk Score and Plan: 3 and Propofol infusion, TIVA, Treatment may vary due to age or medical condition and Ondansetron ? ?Airway Management Planned: Natural Airway ? ?Additional Equipment:  ? ?Intra-op Plan:  ? ?Post-operative Plan:  ? ?Informed Consent: I have reviewed the patients History and Physical, chart, labs and discussed the procedure including the risks, benefits and alternatives for the proposed anesthesia with the patient or authorized representative who has indicated his/her understanding and acceptance.   ? ?Patient has DNR.  ?Discussed DNR with power of attorney. ?  ?Consent reviewed with POA ? ?Plan Discussed with: CRNA and Anesthesiologist ? ?Anesthesia Plan Comments: (Discussed consent with daughter, Marchia Bond, at bedside. Avoid CPR. Proceed with intubation for reversible reasons. Proceed with emergency medications and defibrillation/cardioversion.  Plan for spinal and GA with natural airway, LMA/GETA backup.  Patient's daughter consented for risks of anesthesia including but not limited to:  ?- adverse reactions to medications ?- damage to eyes, teeth, lips or other oral mucosa ?- nerve damage due to positioning  ?- sore throat or hoarseness ?- headache, bleeding, infection, nerve damage 2/2 spinal ?- damage to heart, brain, nerves, lungs, other parts of body or loss of life ? ?Informed patient's daughter about role of CRNA in peri- and intra-operative care; she voiced understanding.)  ? ? ? ?Anesthesia Quick Evaluation ? ?

## 2021-08-12 NOTE — Transfer of Care (Signed)
Immediate Anesthesia Transfer of Care Note ? ?Patient: Tierany Appleby Bottari ? ?Procedure(s) Performed: ARTHROPLASTY BIPOLAR HIP (HEMIARTHROPLASTY) (Left: Hip) ? ?Patient Location: PACU ? ?Anesthesia Type:Spinal ? ?Level of Consciousness: awake ? ?Airway & Oxygen Therapy: Patient Spontanous Breathing and Patient connected to face mask oxygen ? ?Post-op Assessment: Report given to RN and Post -op Vital signs reviewed and stable ? ?Post vital signs: Reviewed and stable ? ?Last Vitals:  ?Vitals Value Taken Time  ?BP 102/72 08/12/21 1815  ?Temp    ?Pulse 103 08/12/21 1822  ?Resp 15 08/12/21 1822  ?SpO2 100 % 08/12/21 1822  ?Vitals shown include unvalidated device data. ? ?Last Pain:  ?Vitals:  ? 08/12/21 1537  ?TempSrc: Tympanic  ?PainSc:   ?   ? ?  ? ?Complications: No notable events documented. ?

## 2021-08-12 NOTE — Progress Notes (Signed)
OT Cancellation Note ? ?Patient Details ?Name: Barbara Vega ?MRN: [id number] ?DOB: [birth year] ? ? ?Cancelled Treatment:    Reason Eval/Treat Not Completed: Other (comment) (pt with planned surgery on this date. Will re-attempt following intervention as able.) ? ?Shanon Payor, OTD OTR/L  ?08/12/21, 8:48 AM  ?

## 2021-08-12 NOTE — Progress Notes (Signed)
Patient awake to name only, asking questions whether surgrey completed and she needs to go home. Re-oriented freq. Medicated as ordered for pain to left hip: surgery site. ?C/o's ingestion, medicated with mylanta 6m as ordered, pain medications administered, pain upon transfer 4/10: faces. ?Adductor pillow in place, xray completed, pulses intact warm/dry bil lower ext.  ?Patient has sensation, difficult to move with adductor pillow in place. ?Per anesthesia ok for transfer to floor, family updated.  ?

## 2021-08-12 NOTE — Progress Notes (Signed)
PT Cancellation Note ? ?Patient Details ?Name: Anyeli Hockenbury Arrona ?MRN: [id number] ?DOB: 12-Aug-1933 ? ? ?Cancelled Treatment:    Reason Eval/Treat Not Completed: Other (comment): Pt with surgical intervention planned for later this date.  Will attempt to see pt tomorrow as medically appropriate.  ? ? ?D. Royetta Asal PT, DPT ?08/12/21, 8:29 AM ? ?

## 2021-08-12 NOTE — Plan of Care (Signed)

## 2021-08-12 NOTE — Progress Notes (Signed)
?PROGRESS NOTE ? ? ? ?Barbara Vega  RSW:[id number] DOB: [birth year] DOA: 08/11/2021 ?PCP: [name], MD  ? ? ? ? ?Brief Narrative:  ? ?Presented after mechanical fall at home, found to have left hip fracture.  ? ? ?Assessment & Plan: ?  ?Principal Problem: ?  Hip fracture ([place]) ?Active Problems: ?  Osteoporosis, post-menopausal ?  Essential hypertension ?  Alzheimer's dementia without behavioral disturbance ([place]) ?  Stroke [facility]) ? ? ?# Close traumatic left femoral neck fracture ?Mechanical fall. Osteoporotic, hx dysphagia, has had mechanical dilation in 2018. No known cardiac history, able to ambulate without chest pain or sob, do not think she needs pre-op cardiac eval ?- pain control ?- ortho plan for operative repair this afternoon ?- npo now ?- pt/ot consults placed, will likely need snf ?- outpt endo f/u for osteoporosis tx, bisphosphonates contraindicated ?  ?# Alzheimer's dementia ?Calm ?- cont home depakote, aricept ?  ?# Hx cva ?Cont home statin, resume aspirin after surgery ? ? ?DVT prophylaxis: SCDs, plan to start lovenox tomorrow ?Code Status: dnr ?Family Communication: daughter updated @ bedside ? ?Level of care: Med-Surg ?Status is: Inpatient ?Remains inpatient appropriate because: severity of illness ? ? ? ?Consultants:  ?orthopedics ? ?Procedures: ?pending ? ?Antimicrobials:  ?Surgical ppx per ortho  ? ? ?Subjective: ?Left hip pain. Mild nausea. ? ?Objective: ?Vitals:  ? 08/12/21 0554 08/12/21 0742 08/12/21 1103 08/12/21 1537  ?BP: (!) 155/67 (!) 152/79 (!) 152/70 (!) 160/72  ?Pulse: 73 81 68 75  ?Resp: '16 18 18 18  '$ ?Temp: 98.1 ?F (36.7 ?C) 98.2 ?F (36.8 ?C) 97.9 ?F (36.6 ?C) 98.7 ?F (37.1 ?C)  ?TempSrc:    Tympanic  ?SpO2: 100% 98% 98% 94%  ?Weight:    59 kg  ?Height:    5' (1.524 m)  ? ? ?Intake/Output Summary (Last 24 hours) at 08/12/2021 1621 ?Last data filed at 08/12/2021 0900 ?Gross per 24 hour  ?Intake --  ?Output 450 ml  ?Net -450 ml  ? ?Filed Weights  ? 08/12/21 1537  ?Weight: 59 kg   ? ? ?Examination: ? ?Constitutional: No acute distress ?Head: Atraumatic ?Respiratory: Clear to auscultation bilaterally, no wheezing/rales/rhonchi. Normal respiratory effort. No accessory muscle use. Marland Kitchen ?Cardiovascular: Regular rate and rhythm. No murmurs/rubs/gallops. ?Abdomen: Non-tender, non-distended. No masses. No rebound or guarding. Positive bowel sounds. ?Musculoskeletal: external rotation left leg ?Skin: No rashes, lesions, or ulcers.  ?Extremities: No peripheral edema. Palpable peripheral pulses. ?Neurologic: Alert, moving all 4 extremities. ?Psychiatric: calm ? ? ? ?Data Reviewed: I have personally reviewed following labs and imaging studies ? ?CBC: ?Recent Labs  ?Lab 08/11/21 ?1427 08/12/21 ?0448  ?WBC 11.7* 10.7*  ?NEUTROABS 9.6*  --   ?HGB 16.1* 14.6  ?HCT 49.5* 43.8  ?MCV 90.2 87.6  ?PLT 330 317  ? ?Basic Metabolic Panel: ?Recent Labs  ?Lab 08/11/21 ?1427  ?NA 134*  ?K 4.4  ?CL 101  ?CO2 22  ?GLUCOSE 125*  ?BUN 15  ?CREATININE 0.73  ?CALCIUM 9.0  ? ?GFR: ?Estimated Creatinine Clearance: 41.3 mL/min (by C-G formula based on SCr of 0.73 mg/dL). ?Liver Function Tests: ?No results for input(s): AST, ALT, ALKPHOS, BILITOT, PROT, ALBUMIN in the last 168 hours. ?No results for input(s): LIPASE, AMYLASE in the last 168 hours. ?No results for input(s): AMMONIA in the last 168 hours. ?Coagulation Profile: ?Recent Labs  ?Lab 08/11/21 ?1427  ?INR 1.1  ? ?Cardiac Enzymes: ?No results for input(s): CKTOTAL, CKMB, CKMBINDEX, TROPONINI in the last 168 hours. ?BNP (last 3 results) ?No  results for input(s): PROBNP in the last 8760 hours. ?HbA1C: ?No results for input(s): HGBA1C in the last 72 hours. ?CBG: ?No results for input(s): GLUCAP in the last 168 hours. ?Lipid Profile: ?No results for input(s): CHOL, HDL, LDLCALC, TRIG, CHOLHDL, LDLDIRECT in the last 72 hours. ?Thyroid Function Tests: ?No results for input(s): TSH, T4TOTAL, FREET4, T3FREE, THYROIDAB in the last 72 hours. ?Anemia Panel: ?No results for input(s):  VITAMINB12, FOLATE, FERRITIN, TIBC, IRON, RETICCTPCT in the last 72 hours. ?Urine analysis: ?   ?Component Value Date/Time  ? [place] YELLOW 06/01/2021 0517  ? APPEARANCEUR CLEAR 06/01/2021 0517  ? APPEARANCEUR Cloudy (A) 12/20/2016 1304  ? LABSPEC 1.015 06/01/2021 0517  ? PHURINE 6.5 06/01/2021 0517  ? GLUCOSEU NEGATIVE 06/01/2021 0517  ? HGBUR TRACE (A) 06/01/2021 0517  ? [place] NEGATIVE 06/01/2021 0517  ? BILIRUBINUR Negative 12/20/2016 1304  ? KETONESUR 40 (A) 06/01/2021 0517  ? PROTEINUR 30 (A) 06/01/2021 0517  ? NITRITE NEGATIVE 06/01/2021 0517  ? LEUKOCYTESUR NEGATIVE 06/01/2021 0517  ? ?Sepsis Labs: ?'@LABRCNTIP'$ (procalcitonin:4,lacticidven:4) ? ?) ?Recent Results (from the past 240 hour(s))  ?Resp Panel by RT-PCR (Flu A&B, Covid) Nasopharyngeal Swab     Status: None  ? Collection Time: 08/11/21  2:27 PM  ? Specimen: Nasopharyngeal Swab; Nasopharyngeal(NP) swabs in vial transport medium  ?Result Value Ref Range Status  ? SARS Coronavirus 2 by RT PCR NEGATIVE NEGATIVE Final  ?  Comment: (NOTE) ?SARS-CoV-2 target nucleic acids are NOT DETECTED. ? ?The SARS-CoV-2 RNA is generally detectable in upper respiratory ?specimens during the acute phase of infection. The lowest ?concentration of SARS-CoV-2 viral copies this assay can detect is ?138 copies/mL. A negative result does not preclude SARS-Cov-2 ?infection and should not be used as the sole basis for treatment or ?other patient management decisions. A negative result may occur with  ?improper specimen collection/handling, submission of specimen other ?than nasopharyngeal swab, presence of viral mutation(s) within the ?areas targeted by this assay, and inadequate number of viral ?copies(<138 copies/mL). A negative result must be combined with ?clinical observations, patient history, and epidemiological ?information. The expected result is Negative. ? ?Fact Sheet for Patients:  ?EntrepreneurPulse.com.au ? ?Fact Sheet for Healthcare  Providers:  ?IncredibleEmployment.be ? ?This test is no t yet approved or cleared by the [place] FDA and  ?has been authorized for detection and/or diagnosis of SARS-CoV-2 by ?FDA under an Emergency Use Authorization (EUA). This EUA will remain  ?in effect (meaning this test can be used) for the duration of the ?COVID-19 declaration under Section 564(b)(1) of the Act, 21 ?U.S.C.section 360bbb-3(b)(1), unless the authorization is terminated  ?or revoked sooner.  ? ? ?  ? Influenza A by PCR NEGATIVE NEGATIVE Final  ? Influenza B by PCR NEGATIVE NEGATIVE Final  ?  Comment: (NOTE) ?The Xpert Xpress SARS-CoV-2/FLU/RSV plus assay is intended as an aid ?in the diagnosis of influenza from Nasopharyngeal swab specimens and ?should not be used as a sole basis for treatment. Nasal washings and ?aspirates are unacceptable for Xpert Xpress SARS-CoV-2/FLU/RSV ?testing. ? ?Fact Sheet for Patients: ?EntrepreneurPulse.com.au ? ?Fact Sheet for Healthcare Providers: ?IncredibleEmployment.be ? ?This test is not yet approved or cleared by the [place] FDA and ?has been authorized for detection and/or diagnosis of SARS-CoV-2 by ?FDA under an Emergency Use Authorization (EUA). This EUA will remain ?in effect (meaning this test can be used) for the duration of the ?COVID-19 declaration under Section 564(b)(1) of the Act, 21 U.S.C. ?section 360bbb-3(b)(1), unless the authorization is terminated or ?revoked. ? ?Performed  at [facility], [place], ?[place] [address] ?  ?Surgical PCR screen     Status: None  ? Collection Time: 08/11/21  9:24 PM  ? Specimen: Nasal Mucosa; Nasal Swab  ?Result Value Ref Range Status  ? MRSA, PCR NEGATIVE NEGATIVE Final  ? Staphylococcus aureus NEGATIVE NEGATIVE Final  ?  Comment: (NOTE) ?The Xpert SA Assay (FDA approved for NASAL specimens in patients 43 ?years of age and older), is one component of a  comprehensive ?surveillance program. It is not intended to diagnose infection nor to ?guide or monitor treatment. ?Performed at [facility], [place], ?[place] [address] ?  ?  ? ? ? ? ? ?Radiology Studies: ?DG

## 2021-08-13 ENCOUNTER — Other Ambulatory Visit: Payer: Self-pay

## 2021-08-13 ENCOUNTER — Encounter: Payer: Self-pay | Admitting: Orthopedic Surgery

## 2021-08-13 DIAGNOSIS — S72002A Fracture of unspecified part of neck of left femur, initial encounter for closed fracture: Secondary | ICD-10-CM | POA: Diagnosis not present

## 2021-08-13 LAB — BASIC METABOLIC PANEL
Anion gap: 7 (ref 5–15)
BUN: 13 mg/dL (ref 8–23)
CO2: 25 mmol/L (ref 22–32)
Calcium: 8 mg/dL — ABNORMAL LOW (ref 8.9–10.3)
Chloride: 96 mmol/L — ABNORMAL LOW (ref 98–111)
Creatinine, Ser: 0.63 mg/dL (ref 0.44–1.00)
GFR, Estimated: >60 mL/min (ref >60)
Glucose, Bld: 138 mg/dL — ABNORMAL HIGH (ref 70–99)
Potassium: 3.4 mmol/L — ABNORMAL LOW (ref 3.5–5.1)
Sodium: 128 mmol/L — ABNORMAL LOW (ref 135–145)

## 2021-08-13 LAB — CBC
HCT: 34.4 % — ABNORMAL LOW (ref 36.0–46.0)
Hemoglobin: 11.5 g/dL — ABNORMAL LOW (ref 12.0–15.0)
MCH: 29.6 pg (ref 26.0–34.0)
MCHC: 33.4 g/dL (ref 30.0–36.0)
MCV: 88.4 fL (ref 80.0–100.0)
Platelets: 277 10*3/uL (ref 150–400)
RBC: 3.89 MIL/uL (ref 3.87–5.11)
RDW: 14.6 % (ref 11.5–15.5)
WBC: 13 10*3/uL — ABNORMAL HIGH (ref 4.0–10.5)
nRBC: 0 % (ref 0.0–0.2)

## 2021-08-13 LAB — TROPONIN I (HIGH SENSITIVITY): Troponin I (High Sensitivity): 10 ng/L (ref <18)

## 2021-08-13 MED ORDER — SODIUM CHLORIDE 0.9 % IV BOLUS
500.0000 mL | Freq: Once | INTRAVENOUS | Status: AC
  Administered 2021-08-13: 500 mL via INTRAVENOUS

## 2021-08-13 MED ORDER — SODIUM CHLORIDE 0.9 % IV SOLN: INTRAVENOUS | Status: DC

## 2021-08-13 NOTE — TOC Progression Note (Signed)
Transition of Care (TOC) - Progression Note  ? ? ?Patient Details  ?Name: Barbara Vega ?MRN: [id number] ?Date of Birth: November 27, 1933 ? ?Transition of Care (TOC) CM/SW Contact  ?Conception Oms, RN ?Phone Number: ?08/13/2021, 2:05 PM ? ?Clinical Narrative:   Obtained PASSR number [id number], Notified [facility] ? ? ? ?Expected Discharge Plan: [place] ?Barriers to Discharge: Continued Medical Work up, SNF Pending bed offer, Awaiting State Approval ([place]) ? ?Expected Discharge Plan and Services ?Expected Discharge Plan: [place] ?  ?  ?  ?  ?                ?  ?  ?  ?  ?  ?  ?  ?  ?  ?  ? ? ?Social Determinants of Health (SDOH) Interventions ?  ? ?Readmission Risk Interventions ?   ? View : No data to display.  ?  ?  ?  ? ? ?

## 2021-08-13 NOTE — Progress Notes (Signed)
?Subjective: ? ?POD #1 s/p left hip hemiarthroplasty.   Patient reports left hip pain as mild to moderate.  Patient is sitting up in the chair.  She has dementia and is unable to provide a detailed history.  Her daughter is in the room with her. ? ?Objective:  ? ?VITALS:   ?Vitals:  ? 08/13/21 0920 08/13/21 1000 08/13/21 1100 08/13/21 1307  ?BP: (!) 96/54 (!) 70/42 (!) 80/51 (!) 88/47  ?Pulse: 82 96  74  ?Resp:  '18 16 16  '$ ?Temp:  97.6 ?F (36.4 ?C)  97.9 ?F (36.6 ?C)  ?TempSrc:      ?SpO2:  99% 98% 98%  ?Weight:      ?Height:      ? ? ?PHYSICAL EXAM: ?Left lower extremity ?Neurovascular intact ?Sensation intact distally ?Intact pulses distally ?Dorsiflexion/Plantar flexion intact ?Aquacel dressing: scant serosanguineous drainage ?No cellulitis present ?Compartment soft ? ?LABS ? ?Results for orders placed or performed during the hospital encounter of 08/11/21 (from the past 24 hour(s))  ?CBC     Status: Abnormal  ? Collection Time: 08/13/21  5:29 AM  ?Result Value Ref Range  ? WBC 13.0 (H) 4.0 - 10.5 K/uL  ? RBC 3.89 3.87 - 5.11 MIL/uL  ? Hemoglobin 11.5 (L) 12.0 - 15.0 g/dL  ? HCT 34.4 (L) 36.0 - 46.0 %  ? MCV 88.4 80.0 - 100.0 fL  ? MCH 29.6 26.0 - 34.0 pg  ? MCHC 33.4 30.0 - 36.0 g/dL  ? RDW 14.6 11.5 - 15.5 %  ? Platelets 277 150 - 400 K/uL  ? nRBC 0.0 0.0 - 0.2 %  ?Basic metabolic panel     Status: Abnormal  ? Collection Time: 08/13/21  5:29 AM  ?Result Value Ref Range  ? Sodium 128 (L) 135 - 145 mmol/L  ? Potassium 3.4 (L) 3.5 - 5.1 mmol/L  ? Chloride 96 (L) 98 - 111 mmol/L  ? CO2 25 22 - 32 mmol/L  ? Glucose, Bld 138 (H) 70 - 99 mg/dL  ? BUN 13 8 - 23 mg/dL  ? Creatinine, Ser 0.63 0.44 - 1.00 mg/dL  ? Calcium 8.0 (L) 8.9 - 10.3 mg/dL  ? GFR, Estimated >60 >60 mL/min  ? Anion gap 7 5 - 15  ?Troponin I (High Sensitivity)     Status: None  ? Collection Time: 08/13/21 11:17 AM  ?Result Value Ref Range  ? Troponin I (High Sensitivity) 10 <18 ng/L  ? ? ?DG Chest Portable 1 View ? ?Result Date: 08/11/2021 ?CLINICAL  DATA:  Preoperative exam for hip fracture. EXAM: PORTABLE CHEST 1 VIEW COMPARISON:  05/31/2021 FINDINGS: Heart size upper limits of normal. Aortic atherosclerosis. The lungs are clear. No infiltrate, collapse or effusion. Old augmented lower thoracic compression fractures. IMPRESSION: No active disease. Electronically Signed   By: Nelson Chimes M.D.   On: 08/11/2021 14:21  ? ?DG Chest Port 1V same Day ? ?Result Date: 08/11/2021 ?CLINICAL DATA:  Fall and left hip fracture. EXAM: PORTABLE CHEST 1 VIEW COMPARISON:  Chest radiograph dated 07/29/2010. FINDINGS: No focal consolidation, pleural effusion or pneumothorax. The cardiac silhouette is within limits. Atherosclerotic calcification of the aorta. No acute osseous pathology. Osteopenia with degenerative changes of the spine and multilevel vertebroplasty. IMPRESSION: No active disease. Electronically Signed   By: Anner Crete M.D.   On: 08/11/2021 20:56  ? ?DG Hip Port Unilat With Pelvis 1V Left ? ?Result Date: 08/12/2021 ?CLINICAL DATA:  Status post left hip hemiarthroplasty. EXAM: DG HIP (WITH OR WITHOUT PELVIS)  1V PORT LEFT COMPARISON:  August 11, 2021 FINDINGS: A left hip replacement is seen which represents a new finding when compared to the prior study. There is no evidence of surrounding lucency to suggest the presence of hardware loosening. There is no evidence of an acute hip fracture or dislocation. A mild amount of soft tissue air is seen along the lateral aspect of the left hip. IMPRESSION: Status post left hip replacement without evidence of hardware complication. Electronically Signed   By: Virgina Norfolk M.D.   On: 08/12/2021 19:19   ? ?Assessment/Plan: ?1 Day Post-Op  ? ?Principal Problem: ?  Hip fracture ([place]) ?Active Problems: ?  Osteoporosis, post-menopausal ?  Essential hypertension ?  Alzheimer's dementia without behavioral disturbance ([place]) ?  Stroke [facility]) ? ? ?Patient doing well from orthopedic standpoint on postop day 1.  Continue physical  therapy as she can tolerate.  Patient may weight-bear as tolerated in the left lower extremity but is on posterior hip precautions.  Continue abduction pillow while in bed.  Patient will need a skilled nursing facility upon discharge.  Recommend continuing Lovenox for DVT prophylaxis until follow-up in my office in 2 weeks after discharge. ? ? ?Thornton Park , MD ?08/13/2021, 2:13 PM ? ? ? ? ?  ?

## 2021-08-13 NOTE — Progress Notes (Signed)
The above named patient is recommended to go to Short Term Rehab for strengthening and gait training for balance.  It is expected that the Short Term Rehab stay will be less than 30 days.  The patient is expected to return home after Rehab.  ?

## 2021-08-13 NOTE — Evaluation (Signed)
Occupational Therapy Evaluation ?Patient Details ?Name: Barbara Vega ?MRN: [id number] ?DOB: [birth year] ?Today's Date: 08/13/2021 ? ? ?History of Present Illness Barbara Vega is a 88 y.o. female with medical history significant for alzheimer's dementia, cva, who presents with the above. Ambulates without assistance at baseline. Was out walking in yard with neighbors earlier today when tripped and fell landing on left hip. No other trauma no LOC. Unable to ambulate after. Denies chest pain or cough or fever. Pt is s/p L hip hemiarthroplasty 08/12/21.  ? ?Clinical Impression ?  ?Chart reviewed, nurse cleared pt for participation in OT evaluation. Co-tx completed with PT. Pt is oriented to self only at start of session, oriented to self, place, situation following verbal cueing at start and end of session. PTA pt reports she lives alone with assist with IADLs as needed. Pt required MAX A for supine>sit with HOB raised, STS with MOD A +2, SPT to bedside chair with CGA-MIN A. Daughter reports pt has assist for all ADL/IADL and has access to assist 24/7 if needed. Pt was not requiring 24/7 assistance PTA and was able to participate in ADL tasks.  ? ?Once seated in chair, pt appears to become unresponsive even with sternal rub, and nursing enters room and calls rapid response. Per RN pulse is present. BP is 96/88.  Pt regains consciousness and by the time therapists leave room she appears back to baseline. Spo2 88% on RA, pt at 92% with 2L via [place]. Pt is + abduction pillow. Pt is performing below PLOF, would recommend discharge to STR to address functional deficits.  ?   ? ?Recommendations for follow up therapy are one component of a multi-disciplinary discharge planning process, led by the attending physician.  Recommendations may be updated based on patient status, additional functional criteria and insurance authorization.  ? ?Follow Up Recommendations ? Skilled nursing-short term rehab (<3 hours/day)  ?  ?Assistance  Recommended at Discharge Frequent or constant Supervision/Assistance  ?Patient can return home with the following A lot of help with walking and/or transfers;A lot of help with bathing/dressing/bathroom;Direct supervision/assist for financial management;Assistance with cooking/housework;Help with stairs or ramp for entrance;Direct supervision/assist for medications management ? ?  ?Functional Status Assessment ? Patient has had a recent decline in their functional status and demonstrates the ability to make significant improvements in function in a reasonable and predictable amount of time.  ?Equipment Recommendations ? None recommended by OT;Other (comment) (per next venue of care)  ?  ?Recommendations for Other Services   ? ? ?  ?Precautions / Restrictions Precautions ?Precautions: Posterior Hip ?Precaution Booklet Issued: No ?Restrictions ?Weight Bearing Restrictions: Yes ?LLE Weight Bearing: Weight bearing as tolerated  ? ?  ? ?Mobility Bed Mobility ?Overal bed mobility: Needs Assistance ?Bed Mobility: Supine to Sit ?  ?  ?  ?  ?  ?  ?  ? ?Transfers ?Overall transfer level: Needs assistance ?Equipment used: Rolling walker (2 wheels) ?  ?Sit to Stand: From elevated surface, +2 physical assistance, Mod assist ?  ?  ?  ?  ?  ?  ?  ? ?  ?Balance Overall balance assessment: Needs assistance ?Sitting-balance support: Feet supported ?Sitting balance-Leahy Scale: Fair ?  ?  ?Standing balance support: Bilateral upper extremity supported, During functional activity, Reliant on assistive device for balance ?Standing balance-Leahy Scale: Poor ?  ?  ?  ?  ?  ?  ?  ?  ?  ?  ?  ?  ?   ? ?ADL  either performed or assessed with clinical judgement  ? ?ADL Overall ADL's : Needs assistance/impaired ?Eating/Feeding: Set up;Sitting ?  ?Grooming: Wash/dry face;Maximal assistance;Sitting ?  ?  ?  ?  ?  ?  ?  ?Lower Body Dressing: Total assistance;Bed level ?  ?Toilet Transfer: Min guard;Minimal assistance;+2 for physical  assistance;Rolling walker (2 wheels) ?Toilet Transfer Details (indicate cue type and reason): SPT simulated to bedside chair ?Toileting- Clothing Manipulation and Hygiene: Maximal assistance ?  ?  ?  ?Functional mobility during ADLs: Min guard;Minimal assistance;Rolling walker (2 wheels) ?   ? ? ? ?Vision Patient Visual Report: No change from baseline ?   ?   ?Perception   ?  ?Praxis   ?  ? ?Pertinent Vitals/Pain Pain Assessment ?Pain Assessment: Faces ?Faces Pain Scale: Hurts even more ?Pain Location: L hip ?Pain Descriptors / Indicators: Discomfort, Grimacing, Guarding ?Pain Intervention(s): Limited activity within patient's tolerance, Monitored during session, Premedicated before session, Repositioned  ? ? ? ?Hand Dominance   ?  ?Extremity/Trunk Assessment Upper Extremity Assessment ?Upper Extremity Assessment: Generalized weakness ?  ?Lower Extremity Assessment ?Lower Extremity Assessment: Generalized weakness;LLE deficits/detail ?LLE Deficits / Details: L hip hemiarthroplasty ?LLE Sensation: WNL ?  ?Cervical / Trunk Assessment ?Cervical / Trunk Assessment: Normal ?  ?Communication Communication ?Communication: No difficulties ?  ?Cognition Arousal/Alertness: Awake/alert ?Behavior During Therapy: Flat affect ?Overall Cognitive Status: Impaired/Different from baseline ?Area of Impairment: Orientation, Attention, Safety/judgement, Awareness, Following commands ?  ?  ?  ?  ?  ?  ?  ?  ?Orientation Level: Disoriented to, Time ?Current Attention Level: Focused ?  ?Following Commands: Follows one step commands with increased time ?Safety/Judgement: Decreased awareness of safety, Decreased awareness of deficits ?Awareness: Intellectual ?  ?General Comments: baseline dementia ?  ?  ?General Comments  pt without c/o dizziness, nausea, endorses pain at start of session; ? ?  ?Exercises   ?  ?Shoulder Instructions    ? ? ?Home Living Family/patient expects to be discharged to:: Private residence ?Living Arrangements:  Alone ?Available Help at Discharge: Neighbor ?Type of Home: House ?Home Access: Stairs to enter ?Entrance Stairs-Number of Steps: 2 ?Entrance Stairs-Rails: None ?Home Layout: Two level;Able to live on main level with bedroom/bathroom ?  ?  ?  ?  ?  ?  ?  ?Home Equipment: Conservation officer, nature (2 wheels);Cane - single point ?  ?Additional Comments: home set up needs to be confirmed with family- pt reports she lives alone, completes all ADL with MOD I-I; daugther assists with grocery shopping/IADLs ?  ? ?  ?Prior Functioning/Environment Prior Level of Function : Needs assist ?  ?  ?  ?Physical Assist : ADLs (physical) ?  ?  ?Mobility Comments: Indep ambulation in home, [facility] in community ?ADLs Comments: Neighbor and daugther assists with all ADL/IADL pt is able to participate as able. ?  ? ?  ?  ?OT Problem List: Decreased strength;Impaired balance (sitting and/or standing);Decreased cognition;Decreased knowledge of precautions;Pain;Decreased safety awareness;Decreased activity tolerance;Decreased knowledge of use of DME or AE ?  ?   ?OT Treatment/Interventions: Self-care/ADL training;Therapeutic exercise;Patient/family education;Neuromuscular education;Energy conservation;Therapeutic activities;DME and/or AE instruction  ?  ?OT Goals(Current goals can be found in the care plan section) Acute Rehab OT Goals ?Patient Stated Goal: feel better ?OT Goal Formulation: With patient ?Time For Goal Achievement: 08/27/21 ?Potential to Achieve Goals: Good ?ADL Goals ?Pt Will Perform Grooming: with min assist ?Pt Will Perform Upper Body Dressing: with min assist;sitting ?Pt Will Perform Lower Body Dressing: with min assist;with adaptive  equipment ?Pt Will Transfer to Toilet: with min assist;bedside commode;ambulating ?Pt Will Perform Toileting - Clothing Manipulation and hygiene: with min assist  ?OT Frequency: Min 3X/week ?  ? ?Co-evaluation PT/OT/SLP Co-Evaluation/Treatment: Yes ?Reason for Co-Treatment: Complexity of the patient's  impairments (multi-system involvement);For patient/therapist safety;To address functional/ADL transfers ?PT goals addressed during session: Mobility/safety with mobility;Strengthening/ROM;Proper use of DME ?OT goals addressed dur

## 2021-08-13 NOTE — Significant Event (Signed)
Rapid Response Event Note  ? ?Reason for Call :  ? ?Possible seizure ?Initial Focused Assessment:  ? ?Per bedside RN- patient had walked with PT- and patient had a seizure that lasted less than 1 minute.  Was also told that she believes patient possibly vagaled down. ? ? ? ?Interventions:  ? ?None -by the time I arrived- patient was back to baseline per bedside RN- vitals stable.   ?Plan of Care:  ? ? ? ?Event Summary:  ? ?MD Notified:  ?Call Time: ?Arrival Time: ?End Time: ? ?Tabatha Razzano J, RN ?

## 2021-08-13 NOTE — Evaluation (Signed)
Physical Therapy Evaluation ?Patient Details ?Name: Barbara Vega ?MRN: [id number] ?DOB: Jul 01, 1933 ?Today's Date: 08/13/2021 ? ?History of Present Illness ? Barbara Vega is a 88 y.o. female with medical history significant for alzheimer's dementia, cva, who presents with the above. Ambulates without assistance at baseline. Was out walking in yard with neighbors earlier today when tripped and fell landing on left hip. No other trauma no LOC. Unable to ambulate after. Denies chest pain or cough or fever. Pt is s/p L hip hemiarthroplasty 08/12/21. ?  ?Clinical Impression ? Pt admitted with above diagnosis. Pt received in bed with OT and RN in room. CO-eval performed for pt safety for mobility, confusion limiting safe understanding of maintaining posterior hip precautions. Reliance on EMR for home lay out, PLOF, DME, etc. Due to pt confusion and baseline dementia. Per EMR pt independent with ambulation without AD in house and with [facility] in community, was recorded pt ambulating outside with neighbors when she fell leading to current acute admission. Pt supposedly receives assist from neighbor for ADL's/IADL's but unsure to how much assist was being given.  To dat, pt requiring HOB elevated and modA+2 at LLE and torso to sit EOB. Pt performing STS to RW with modA+2 with VC's for correct hand placement. In standing with Stand step transfer, pt is slow moving with heavy UE reliance on RW with minimal foot clearance leading to limited foot clearance and shuffling, small steps to move towards recliner. Pt requiring modA+2 for guarding pt and minA +1 at RW to assist in sequencing with LE's. Once pt in recliner, pt appeared to lose consciousness with head becoming limp. With sternal rub and calling name, pt returned to alert lifting her head with her eyes open then appearing to los consciousness again. Rapid response was called with pt being reclined fully in recliner and placed on her R side for possible seizure precaution. Per  RN in room pulse was felt, vitals taken:  ? ?BP supine: 96/54 mm Hg, Spo2 87% on RA. Pt placed on 2 L/min with SPO2 at 92% upon exiting room. ? ?Pt becoming alert and awake thus placed in supine with donning abduction wedge for safe LLE placement with pt asking questions. Rapid response team and RN at bedside with pt. Pt currently with functional limitations due to the deficits listed below (see PT Problem List) with current need of +2 assist for all mobility, confusion leading to difficulty in safe understanding of posterior hip precautions at this time thus benefit from STR at discharge. Pt will benefit from skilled PT to increase their independence and safety with mobility to allow discharge to the venue listed below.     ? ?Recommendations for follow up therapy are one component of a multi-disciplinary discharge planning process, led by the attending physician.  Recommendations may be updated based on patient status, additional functional criteria and insurance authorization. ? ?Follow Up Recommendations Skilled nursing-short term rehab (<3 hours/day) ? ?  ?Assistance Recommended at Discharge Frequent or constant Supervision/Assistance  ?Patient can return home with the following ? Two people to help with walking and/or transfers;Two people to help with bathing/dressing/bathroom;Help with stairs or ramp for entrance;Assist for transportation;Assistance with cooking/housework ? ?  ?Equipment Recommendations Other (comment) (tbd by next venue of care)  ?Recommendations for Other Services ?    ?  ?Functional Status Assessment Patient has had a recent decline in their functional status and demonstrates the ability to make significant improvements in function in a reasonable and predictable amount of  time.  ? ?  ?Precautions / Restrictions Precautions ?Precautions: Posterior Hip ?Precaution Booklet Issued: No ?Restrictions ?Weight Bearing Restrictions: Yes ?LLE Weight Bearing: Weight bearing as tolerated  ? ?   ? ?Mobility ? Bed Mobility ?  ?  ?  ?  ?Supine to sit: Max assist, +2 for physical assistance ?  ?  ?  ?Patient Response: Cooperative, Flat affect ? ?Transfers ?  ?  ?Transfers: Sit to/from Stand, Bed to chair/wheelchair/BSC ?  ?  ?Step pivot transfers: Min assist, +2 physical assistance ?  ?  ?  ?General transfer comment: One for guarding patient, one for assist in sequencing steps with RW ?  ? ?Ambulation/Gait ?  ?  ?  ?  ?  ?  ?  ?  ? ?Stairs ?  ?  ?  ?  ?  ? ?Wheelchair Mobility ?  ? ?Modified Rankin (Stroke Patients Only) ?  ? ?  ? ?Balance   ?  ?  ?  ?  ?  ?  ?Standing balance comment: Reliant on AD, decreased WB'ing on LLE ?  ?  ?  ?  ?  ?  ?  ?  ?  ?  ?  ?   ? ? ? ?Pertinent Vitals/Pain Pain Assessment ?Pain Assessment: Faces ?Faces Pain Scale: Hurts even more ?Pain Location: L hip incision ?Pain Descriptors / Indicators: Discomfort, Grimacing, Guarding ?Pain Intervention(s): Limited activity within patient's tolerance, Monitored during session, Premedicated before session, Repositioned  ? ? ?Home Living Family/patient expects to be discharged to:: Private residence ?Living Arrangements: Alone ?Available Help at Discharge: Neighbor ?Type of Home: House ?Home Access: Stairs to enter ?Entrance Stairs-Rails: None ?Entrance Stairs-Number of Steps: 2 ?  ?Home Layout: Two level;Able to live on main level with bedroom/bathroom ?Home Equipment: Conservation officer, nature (2 wheels);Cane - single point ?Additional Comments: home set up needs to be confirmed with family- pt reports she lives alone, completes all ADL with MOD I-I; daugther assists with grocery shopping/IADLs  ?  ?Prior Function Prior Level of Function : Needs assist ?  ?  ?  ?Physical Assist : ADLs (physical) ?  ?  ?Mobility Comments: Indep ambulation in home, [facility] in community ?ADLs Comments: Assist in meals, driving. Neighbor assists with tasks as needed. ?  ? ? ?Hand Dominance  ?   ? ?  ?Extremity/Trunk Assessment  ? Upper Extremity Assessment ?Upper Extremity  Assessment: Generalized weakness ?  ? ?Lower Extremity Assessment ?Lower Extremity Assessment: Generalized weakness;LLE deficits/detail ?LLE Deficits / Details: L hip hemiarthroplasty ?LLE Sensation: WNL ?  ? ?Cervical / Trunk Assessment ?Cervical / Trunk Assessment: Normal  ?Communication  ? Communication: No difficulties  ?Cognition Arousal/Alertness: Awake/alert ?Behavior During Therapy: Flat affect ?Overall Cognitive Status: History of cognitive impairments - at baseline ?  ?  ?  ?  ?  ?  ?  ?  ?  ?  ?  ?  ?  ?  ?  ?  ?General Comments: Dementia at baseline ?  ?  ? ?  ?General Comments General comments (skin integrity, edema, etc.): pt without c/o dizziness, nausea, endorses pain at start of session; ? ?  ?Exercises Other Exercises ?Other Exercises: Role of PT in acute setting, d/c recs, WB'ing precautions, posterior hip precautions  ? ?Assessment/Plan  ?  ?PT Assessment Patient needs continued PT services  ?PT Problem List Decreased strength;Decreased mobility;Decreased safety awareness;Decreased range of motion;Decreased knowledge of precautions;Decreased activity tolerance;Decreased cognition;Decreased balance;Decreased knowledge of use of DME;Pain ? ?   ?  ?  PT Treatment Interventions DME instruction;Therapeutic exercise;Gait training;Balance training;Stair training;Neuromuscular re-education;Functional mobility training;Therapeutic activities;Patient/family education   ? ?PT Goals (Current goals can be found in the Care Plan section)  ?Acute Rehab PT Goals ?PT Goal Formulation: Patient unable to participate in goal setting ? ?  ?Frequency BID ?  ? ? ?Co-evaluation PT/OT/SLP Co-Evaluation/Treatment: Yes ?Reason for Co-Treatment: Complexity of the patient's impairments (multi-system involvement);For patient/therapist safety;To address functional/ADL transfers ?PT goals addressed during session: Mobility/safety with mobility;Strengthening/ROM;Proper use of DME ?OT goals addressed during session: ADL's and  self-care;Proper use of Adaptive equipment and DME ?  ? ? ?  ?AM-PAC PT "6 Clicks" Mobility  ?Outcome Measure Help needed turning from your back to your side while in a flat bed without using bedrails?: A Lot ?Hel

## 2021-08-13 NOTE — Progress Notes (Signed)
?   08/13/21 0900  ?Clinical Encounter Type  ?Visited With Patient;Health care provider  ?Visit Type Initial;Code  ? ?Chaplain responded to a Rapid. ?

## 2021-08-13 NOTE — TOC Progression Note (Signed)
Transition of Care (TOC) - Progression Note  ? ? ?Patient Details  ?Name: Barbara Vega ?MRN: [id number] ?Date of Birth: November 09, 1933 ? ?Transition of Care (TOC) CM/SW Contact  ?Conception Oms, RN ?Phone Number: ?08/13/2021, 12:10 PM ? ?Clinical Narrative:   Spoke with the patient's daughter in the room, she is agreeable to have the patient go to STR SNF ?She prefers [facility] second choice is [place], Does not want to go to [facility] or to [facility] ? ? ? ?  ?  ? ?Expected Discharge Plan and Services ?  ?  ?  ?  ?  ?                ?  ?  ?  ?  ?  ?  ?  ?  ?  ?  ? ? ?Social Determinants of Health (SDOH) Interventions ?  ? ?Readmission Risk Interventions ?   ? View : No data to display.  ?  ?  ?  ? ? ?

## 2021-08-13 NOTE — Progress Notes (Signed)
?PROGRESS NOTE ? ? ? ?Barbara Vega  MCN:[id number] DOB: 1934-01-21 DOA: 08/11/2021 ?PCP: [name], MD  ? ? ? ? ?Brief Narrative:  ? ?Presented after mechanical fall at home, found to have left hip fracture.  ? ? ?Assessment & Plan: ?  ?Principal Problem: ?  Hip fracture ([place]) ?Active Problems: ?  Osteoporosis, post-menopausal ?  Essential hypertension ?  Alzheimer's dementia without behavioral disturbance ([place]) ?  Stroke [facility]) ? ? ?# Close traumatic left femoral neck fracture ?Mechanical fall. Osteoporotic, hx dysphagia, has had mechanical dilation in 2018. No known cardiac history. S/p left hip hemiarthroplasty 3/29 with ortho ?- pain control, bowel regimen ?- PT/OT consulted ?- foley out, waiting to urinate ?- diet started by ortho ?- outpt endo f/u for osteoporosis tx, bisphosphonates contraindicated ? ?# Orthostasis ?Near syncope when up out of bed to chair today with PT, BPs since soft with MAPs in the low 60s. No sig anemia on this morning's labs ?- 500 cc bolus then maintenance ?- f/u repeat cbc, bnp, trop ?- hold opioids for now ?  ?# Alzheimer's dementia ?Calm ?- hold aricept given hypotension, cont depakote ?  ?# Hx cva ?Cont home statin, resume aspirin  ? ? ?DVT prophylaxis: lovenox ?Code Status: dnr ?Family Communication: daughter updated @ bedside ? ?Level of care: Med-Surg ?Status is: Inpatient ?Remains inpatient appropriate because: severity of illness ? ? ? ?Consultants:  ?orthopedics ? ?Procedures: ?pending ? ?Antimicrobials:  ?Surgical ppx per ortho  ? ? ?Subjective: ?Complains of chronic nausea. No dyspnea or cough.  ? ?Objective: ?Vitals:  ? 08/12/21 1945 08/12/21 2012 08/13/21 0920 08/13/21 1000  ?BP: (!) 106/44 110/66 (!) 96/54 (!) 70/42  ?Pulse: 74 99 82 96  ?Resp: '11 15  18  '$ ?Temp: 97.8 ?F (36.6 ?C) (!) 97.2 ?F (36.2 ?C)  97.6 ?F (36.4 ?C)  ?TempSrc:      ?SpO2: 100% 99%  99%  ?Weight:      ?Height:      ? ? ?Intake/Output Summary (Last 24 hours) at 08/13/2021 1047 ?Last data filed at  08/13/2021 0645 ?Gross per 24 hour  ?Intake 1300 ml  ?Output 750 ml  ?Net 550 ml  ? ?Filed Weights  ? 08/12/21 1537  ?Weight: 59 kg  ? ? ?Examination: ? ?Constitutional: mild distress ?Head: Atraumatic ?Respiratory: Clear to auscultation bilaterally, no wheezing/rales/rhonchi. Normal respiratory effort. No accessory muscle use. Marland Kitchen ?Cardiovascular: Regular rate and rhythm. No murmurs/rubs/gallops. ?Abdomen: Non-tender, non-distended. No masses. No rebound or guarding. Positive bowel sounds. ?Musculoskeletal: external rotation left leg ?Skin: No rashes, lesions, or ulcers.  ?Extremities: trace LE edema, warm ?Neurologic: moving all 4 extremities. ?Psychiatric: calm ? ? ? ?Data Reviewed: I have personally reviewed following labs and imaging studies ? ?CBC: ?Recent Labs  ?Lab 08/11/21 ?1427 08/12/21 ?0448 08/13/21 ?6294  ?WBC 11.7* 10.7* 13.0*  ?NEUTROABS 9.6*  --   --   ?HGB 16.1* 14.6 11.5*  ?HCT 49.5* 43.8 34.4*  ?MCV 90.2 87.6 88.4  ?PLT 330 317 277  ? ?Basic Metabolic Panel: ?Recent Labs  ?Lab 08/11/21 ?1427 08/12/21 ?0448 08/13/21 ?7654  ?NA 134*  --  128*  ?K 4.4  --  3.4*  ?CL 101  --  96*  ?CO2 22  --  25  ?GLUCOSE 125*  --  138*  ?BUN 15  --  13  ?CREATININE 0.73 0.53 0.63  ?CALCIUM 9.0  --  8.0*  ? ?GFR: ?Estimated Creatinine Clearance: 41.3 mL/min (by C-G formula based on SCr of 0.63 mg/dL). ?Liver  Function Tests: ?No results for input(s): AST, ALT, ALKPHOS, BILITOT, PROT, ALBUMIN in the last 168 hours. ?No results for input(s): LIPASE, AMYLASE in the last 168 hours. ?No results for input(s): AMMONIA in the last 168 hours. ?Coagulation Profile: ?Recent Labs  ?Lab 08/11/21 ?1427  ?INR 1.1  ? ?Cardiac Enzymes: ?No results for input(s): CKTOTAL, CKMB, CKMBINDEX, TROPONINI in the last 168 hours. ?BNP (last 3 results) ?No results for input(s): PROBNP in the last 8760 hours. ?HbA1C: ?No results for input(s): HGBA1C in the last 72 hours. ?CBG: ?No results for input(s): GLUCAP in the last 168 hours. ?Lipid  Profile: ?No results for input(s): CHOL, HDL, LDLCALC, TRIG, CHOLHDL, LDLDIRECT in the last 72 hours. ?Thyroid Function Tests: ?No results for input(s): TSH, T4TOTAL, FREET4, T3FREE, THYROIDAB in the last 72 hours. ?Anemia Panel: ?No results for input(s): VITAMINB12, FOLATE, FERRITIN, TIBC, IRON, RETICCTPCT in the last 72 hours. ?Urine analysis: ?   ?Component Value Date/Time  ? [place] YELLOW 06/01/2021 0517  ? APPEARANCEUR CLEAR 06/01/2021 0517  ? APPEARANCEUR Cloudy (A) 12/20/2016 1304  ? LABSPEC 1.015 06/01/2021 0517  ? PHURINE 6.5 06/01/2021 0517  ? GLUCOSEU NEGATIVE 06/01/2021 0517  ? HGBUR TRACE (A) 06/01/2021 0517  ? [place] NEGATIVE 06/01/2021 0517  ? BILIRUBINUR Negative 12/20/2016 1304  ? KETONESUR 40 (A) 06/01/2021 0517  ? PROTEINUR 30 (A) 06/01/2021 0517  ? NITRITE NEGATIVE 06/01/2021 0517  ? LEUKOCYTESUR NEGATIVE 06/01/2021 0517  ? ?Sepsis Labs: ?'@LABRCNTIP'$ (procalcitonin:4,lacticidven:4) ? ?) ?Recent Results (from the past 240 hour(s))  ?Resp Panel by RT-PCR (Flu A&B, Covid) Nasopharyngeal Swab     Status: None  ? Collection Time: 08/11/21  2:27 PM  ? Specimen: Nasopharyngeal Swab; Nasopharyngeal(NP) swabs in vial transport medium  ?Result Value Ref Range Status  ? SARS Coronavirus 2 by RT PCR NEGATIVE NEGATIVE Final  ?  Comment: (NOTE) ?SARS-CoV-2 target nucleic acids are NOT DETECTED. ? ?The SARS-CoV-2 RNA is generally detectable in upper respiratory ?specimens during the acute phase of infection. The lowest ?concentration of SARS-CoV-2 viral copies this assay can detect is ?138 copies/mL. A negative result does not preclude SARS-Cov-2 ?infection and should not be used as the sole basis for treatment or ?other patient management decisions. A negative result may occur with  ?improper specimen collection/handling, submission of specimen other ?than nasopharyngeal swab, presence of viral mutation(s) within the ?areas targeted by this assay, and inadequate number of viral ?copies(<138 copies/mL).  A negative result must be combined with ?clinical observations, patient history, and epidemiological ?information. The expected result is Negative. ? ?Fact Sheet for Patients:  ?EntrepreneurPulse.com.au ? ?Fact Sheet for Healthcare Providers:  ?IncredibleEmployment.be ? ?This test is no t yet approved or cleared by the [place] FDA and  ?has been authorized for detection and/or diagnosis of SARS-CoV-2 by ?FDA under an Emergency Use Authorization (EUA). This EUA will remain  ?in effect (meaning this test can be used) for the duration of the ?COVID-19 declaration under Section 564(b)(1) of the Act, 21 ?U.S.C.section 360bbb-3(b)(1), unless the authorization is terminated  ?or revoked sooner.  ? ? ?  ? Influenza A by PCR NEGATIVE NEGATIVE Final  ? Influenza B by PCR NEGATIVE NEGATIVE Final  ?  Comment: (NOTE) ?The Xpert Xpress SARS-CoV-2/FLU/RSV plus assay is intended as an aid ?in the diagnosis of influenza from Nasopharyngeal swab specimens and ?should not be used as a sole basis for treatment. Nasal washings and ?aspirates are unacceptable for Xpert Xpress SARS-CoV-2/FLU/RSV ?testing. ? ?Fact Sheet for Patients: ?EntrepreneurPulse.com.au ? ?Fact Sheet for Healthcare Providers: ?  IncredibleEmployment.be ? ?This test is not yet approved or cleared by the [place] FDA and ?has been authorized for detection and/or diagnosis of SARS-CoV-2 by ?FDA under an Emergency Use Authorization (EUA). This EUA will remain ?in effect (meaning this test can be used) for the duration of the ?COVID-19 declaration under Section 564(b)(1) of the Act, 21 U.S.C. ?section 360bbb-3(b)(1), unless the authorization is terminated or ?revoked. ? ?Performed at [facility], [place], ?[place] [address] ?  ?Surgical PCR screen     Status: None  ? Collection Time: 08/11/21  9:24 PM  ? Specimen: Nasal Mucosa; Nasal Swab  ?Result Value Ref Range  Status  ? MRSA, PCR NEGATIVE NEGATIVE Final  ? Staphylococcus aureus NEGATIVE NEGATIVE Final  ?  Comment: (NOTE) ?The Xpert SA Assay (FDA approved for NASAL specimens in patients 22 ?years of age and older), is one com

## 2021-08-13 NOTE — Plan of Care (Signed)

## 2021-08-13 NOTE — NC FL2 (Signed)
?[place] MEDICAID FL2 LEVEL OF CARE SCREENING TOOL  ?  ? ?IDENTIFICATION  ?Patient Name: ?Barbara Vega Birthdate: [birth year] Sex: female Admission Date (Current Location): ?08/11/2021  ?[place] and [place] Number: ? [place] ?  Facility and Address:  ?[facility], [address], [place], [place] [address] ?     Provider Number: ?[id number]  ?Attending Physician Name and Address:  ?Gwynne Edinger, MD ? Relative Name and Phone Number:  ?[name] Daughter ?   ?Current Level of Care: ?Hospital Recommended Level of Care: ?[place] Prior Approval Number: ?  ? ?Date Approved/Denied: ?  PASRR Number: ?Pending ? ?Discharge Plan: ?SNF ?  ? ?Current Diagnoses: ?Patient Active Problem List  ? Diagnosis Date Noted  ? Hip fracture ([place]) 08/11/2021  ? Generalized abdominal pain   ? Acute hyponatremia 06/01/2021  ? HLD (hyperlipidemia) 06/01/2021  ? Stroke ([place]) 06/01/2021  ? Neck pain 06/01/2021  ? Acute CVA (cerebrovascular accident) ([place]) 03/26/2021  ? Paresthesia 03/26/2021  ? Alzheimer's dementia without behavioral disturbance ([place])   ? Hiatal hernia with GERD 10/26/2020  ? Generalized weakness 06/15/2019  ? Hypokalemia 06/14/2019  ? Pain due to onychomycosis of toenails of both feet 06/07/2019  ? S/P kyphoplasty 06/01/2019  ? DDD (degenerative disc disease), lumbar 06/16/2017  ? SI (sacroiliac) joint dysfunction 06/16/2017  ? Incarcerated left inguinal hernia   ? Hyponatremia 11/20/2016  ? GERD (gastroesophageal reflux disease) 09/20/2016  ? Dysphagia, unspecified 08/09/2016  ? Papilloma of breast 04/03/2015  ? Osteoporosis, post-menopausal 03/04/2015  ? Cannot sleep 03/04/2015  ? Essential hypertension 03/04/2015  ? Adenomatous colon polyp 03/04/2015  ? ? ?Orientation RESPIRATION BLADDER Height & Weight   ?  ?Self, Place ? Normal Incontinent Weight: 59 kg ?Height:  5' (152.4 cm)  ?BEHAVIORAL SYMPTOMS/MOOD NEUROLOGICAL BOWEL NUTRITION STATUS  ?    Continent Diet (Dc summary)   ?AMBULATORY STATUS COMMUNICATION OF NEEDS Skin   ?Extensive Assist Verbally Normal, Bruising, Surgical wounds ?  ?  ?  ?    ?     ?     ? ? ?Personal Care Assistance Level of Assistance  ?Bathing, Feeding, Dressing Bathing Assistance: Maximum assistance ?Feeding assistance: Limited assistance ?Dressing Assistance: Maximum assistance ?   ? ?Functional Limitations Info  ?    ?  ?   ? ? ?SPECIAL CARE FACTORS FREQUENCY  ?PT (By licensed PT), OT (By licensed OT)   ?  ?PT Frequency: 5 times per week ?OT Frequency: 5 times per week ?  ?  ?  ?   ? ? ?Contractures Contractures Info: Not present  ? ? ?Additional Factors Info  ?Code Status, Allergies Code Status Info: DNR ?Allergies Info: Codone (Hydrocodone), Codeine ?  ?  ?  ?   ? ?Current Medications (08/13/2021):  This is the current hospital active medication list ?Current Facility-Administered Medications  ?Medication Dose Route Frequency Provider Last Rate Last Admin  ? 0.9 %  sodium chloride infusion  75 mL/hr Intravenous Continuous Thornton Park, MD 75 mL/hr at 08/12/21 2124 75 mL/hr at 08/12/21 2124  ? acetaminophen (TYLENOL) tablet 325-650 mg  325-650 mg Oral Q6H PRN Thornton Park, MD      ? acetaminophen (TYLENOL) tablet 500 mg  500 mg Oral Q6H Thornton Park, MD   500 mg at 08/13/21 0549  ? alum & mag hydroxide-simeth (MAALOX/MYLANTA) 200-200-20 MG/5ML suspension 30 mL  30 mL Oral Q4H PRN Thornton Park, MD   30 mL at 08/13/21 0549  ? aspirin EC tablet  81 mg  81 mg Oral Daily Thornton Park, MD   81 mg at 08/13/21 6144  ? atorvastatin (LIPITOR) tablet 20 mg  20 mg Oral QHS Thornton Park, MD   20 mg at 08/12/21 2055  ? bisacodyl (DULCOLAX) suppository 10 mg  10 mg Rectal Daily PRN Thornton Park, MD      ? divalproex (DEPAKOTE) DR tablet 250 mg  250 mg Oral BID Thornton Park, MD   250 mg at 08/13/21 3154  ? docusate sodium (COLACE) capsule 100 mg  100 mg Oral BID Thornton Park, MD   100 mg at 08/12/21 2055  ? enoxaparin (LOVENOX) injection 40  mg  40 mg Subcutaneous Q24H Thornton Park, MD   40 mg at 08/13/21 0856  ? HYDROcodone-acetaminophen (NORCO/VICODIN) 5-325 MG per tablet 1-2 tablet  1-2 tablet Oral Q4H PRN Thornton Park, MD   2 tablet at 08/13/21 0855  ? methocarbamol (ROBAXIN) tablet 500 mg  500 mg Oral Q6H PRN Thornton Park, MD      ? Or  ? methocarbamol (ROBAXIN) 500 mg in dextrose 5 % 50 mL IVPB  500 mg Intravenous Q6H PRN Thornton Park, MD      ? morphine (PF) 2 MG/ML injection 0.5-1 mg  0.5-1 mg Intravenous Q2H PRN Thornton Park, MD   0.5 mg at 08/12/21 1855  ? mupirocin ointment (BACTROBAN) 2 % 1 application.  1 application. Nasal BID Thornton Park, MD   1 application. at 08/12/21 2126  ? ondansetron (ZOFRAN) tablet 4 mg  4 mg Oral Q6H PRN Thornton Park, MD      ? Or  ? ondansetron [facility]) injection 4 mg  4 mg Intravenous Q6H PRN Thornton Park, MD   4 mg at 08/13/21 1007  ? pantoprazole (PROTONIX) EC tablet 20 mg  20 mg Oral Daily Thornton Park, MD   20 mg at 08/13/21 0856  ? polyethylene glycol (MIRALAX / GLYCOLAX) packet 17 g  17 g Oral Daily PRN Thornton Park, MD      ? senna Donavan Burnet) tablet 8.6 mg  1 tablet Oral BID Thornton Park, MD   8.6 mg at 08/12/21 2055  ? sodium chloride 0.9 % bolus 500 mL  500 mL Intravenous Once Wouk, Ailene Rud, MD      ? ? ? ?Discharge Medications: ?Please see discharge summary for a list of discharge medications. ? ?Relevant Imaging Results: ? ?Relevant Lab Results: ? ? ?Additional Information ?SS# [id number] ? ?Conception Oms, RN ? ? ? ? ?

## 2021-08-13 NOTE — TOC Progression Note (Addendum)
Transition of Care (TOC) - Progression Note  ? ? ?Patient Details  ?Name: Barbara Vega ?MRN: [id number] ?Date of Birth: April 10, 1934 ? ?Transition of Care (TOC) CM/SW Contact  ?Conception Oms, RN ?Phone Number: ?08/13/2021, 1:43 PM ? ?Clinical Narrative:   Reviewed the bed offers with the patient's daughter and she chose [facility], Ins will need to be Obtained when medically stable, PASSR still Pending ? ? ? ?Expected Discharge Plan: [place] ?Barriers to Discharge: Continued Medical Work up, SNF Pending bed offer, Awaiting State Approval ([place]) ? ?Expected Discharge Plan and Services ?Expected Discharge Plan: [place] ?  ?  ?  ?  ?                ?  ?  ?  ?  ?  ?  ?  ?  ?  ?  ? ? ?Social Determinants of Health (SDOH) Interventions ?  ? ?Readmission Risk Interventions ?   ? View : No data to display.  ?  ?  ?  ? ? ?

## 2021-08-13 NOTE — Progress Notes (Signed)
Physical Therapy Treatment ?Patient Details ?Name: Barbara Vega ?MRN: [id number] ?DOB: 1934/03/11 ?Today's Date: 08/13/2021 ? ? ?History of Present Illness Barbara Vega is a 88 y.o. female with medical history significant for alzheimer's dementia, cva, who presents with the above. Ambulates without assistance at baseline. Was out walking in yard with neighbors earlier today when tripped and fell landing on left hip. No other trauma no LOC. Unable to ambulate after. Denies chest pain or cough or fever. Pt is s/p L hip hemiarthroplasty 08/12/21. ? ?  ?PT Comments  ? ? Pt received for afternoon session to see tolerance for BID frequency.  Pt remaining very pain limited requiring +2 maxA for mobility due to cognition and pain levels to SPT from recliner back to bed relying on modA from PT for RW sequencing and mod multimodal cues for pivoting/steps. Pt unable to maintain equal WB'ing with significant weight shift onto operated limb (LLE) despite max education and cues to neutral weight shift onto BLE's to assist in pain management with very poor carryover. Pt maxA+2 to return to supine in bed with RN present for bladder scan. Daughter educated on posterior hip precautions with pt verbalizing understanding. D/c recs remain appropriate at this time due to aforementioned deficits. Will trial BID tomorrow but if poor tolerance for activity continues, pt may benefit from QD frequency.  ?  ?Recommendations for follow up therapy are one component of a multi-disciplinary discharge planning process, led by the attending physician.  Recommendations may be updated based on patient status, additional functional criteria and insurance authorization. ? ?Follow Up Recommendations ? Skilled nursing-short term rehab (<3 hours/day) ?  ?  ?Assistance Recommended at Discharge Frequent or constant Supervision/Assistance  ?Patient can return home with the following Two people to help with walking and/or transfers;Two people to help with  bathing/dressing/bathroom;Help with stairs or ramp for entrance;Assist for transportation;Assistance with cooking/housework ?  ?Equipment Recommendations ? Other (comment)  ?  ?Recommendations for Other Services   ? ? ?  ?Precautions / Restrictions Precautions ?Precautions: Posterior Hip ?Precaution Booklet Issued: No ?Restrictions ?Weight Bearing Restrictions: Yes ?LLE Weight Bearing: Weight bearing as tolerated  ?  ? ?Mobility ? Bed Mobility ?Overal bed mobility: Needs Assistance ?Bed Mobility: Sit to Supine ?  ?  ?Supine to sit: Max assist, +2 for physical assistance ?Sit to supine: Max assist, +2 for physical assistance ?  ?  ?Patient Response: Cooperative, Flat affect ? ?Transfers ?Overall transfer level: Needs assistance ?Equipment used: Rolling walker (2 wheels) ?Transfers: Bed to chair/wheelchair/BSC ?Sit to Stand: Max assist, +2 physical assistance ?  ?Step pivot transfers: Mod assist, +2 physical assistance ?  ?  ?  ?General transfer comment: VC's for sequencing ?  ? ?Ambulation/Gait ?  ?  ?  ?  ?  ?  ?  ?  ? ? ?Stairs ?  ?  ?  ?  ?  ? ? ?Wheelchair Mobility ?  ? ?Modified Rankin (Stroke Patients Only) ?  ? ? ?  ?Balance Overall balance assessment: Needs assistance ?Sitting-balance support: Feet supported ?Sitting balance-Leahy Scale: Fair ?  ?  ?Standing balance support: Bilateral upper extremity supported, During functional activity, Reliant on assistive device for balance ?Standing balance-Leahy Scale: Poor ?Standing balance comment: Reliant on AD, decreased WB'ing on LLE ?  ?  ?  ?  ?  ?  ?  ?  ?  ?  ?  ?  ? ?  ?Cognition Arousal/Alertness: Awake/alert ?Behavior During Therapy: Flat affect ?Overall Cognitive Status: History  of cognitive impairments - at baseline ?  ?  ?  ?  ?  ?  ?  ?  ?  ?  ?  ?  ?  ?  ?  ?  ?General Comments: baseline dementia ?  ?  ? ?  ?Exercises Other Exercises ?Other Exercises: Daughter educated on post hip precautions ? ?  ?General Comments   ?  ?  ? ?Pertinent Vitals/Pain Pain  Assessment ?Pain Assessment: Faces ?Faces Pain Scale: Hurts even more ?Pain Location: L hip ?Pain Descriptors / Indicators: Discomfort, Grimacing, Guarding ?Pain Intervention(s): Limited activity within patient's tolerance, Monitored during session, Ice applied  ? ? ?Home Living   ?  ?  ?  ?  ?  ?  ?  ?  ?  ?   ?  ?Prior Function    ?  ?  ?   ? ?PT Goals (current goals can now be found in the care plan section) Acute Rehab PT Goals ?PT Goal Formulation: Patient unable to participate in goal setting ? ?  ?Frequency ? ? ? BID ? ? ? ?  ?PT Plan Current plan remains appropriate  ? ? ?Co-evaluation   ?  ?PT goals addressed during session: Mobility/safety with mobility;Proper use of DME;Strengthening/ROM ?  ?  ? ?  ?AM-PAC PT "6 Clicks" Mobility   ?Outcome Measure ? Help needed turning from your back to your side while in a flat bed without using bedrails?: A Lot ?Help needed moving from lying on your back to sitting on the side of a flat bed without using bedrails?: A Lot ?Help needed moving to and from a bed to a chair (including a wheelchair)?: A Lot ?Help needed standing up from a chair using your arms (e.g., wheelchair or bedside chair)?: A Lot ?Help needed to walk in hospital room?: A Lot ?Help needed climbing 3-5 steps with a railing? : Total ?6 Click Score: 11 ? ?  ?End of Session Equipment Utilized During Treatment: Gait belt ?Activity Tolerance: Patient limited by pain ?Patient left: in bed;with call bell/phone within reach;with bed alarm set;with nursing/sitter in room;with family/visitor present ?Nurse Communication: Mobility status ?PT Visit Diagnosis: Unsteadiness on feet (R26.81);Muscle weakness (generalized) (M62.81);History of falling (Z91.81);Other abnormalities of gait and mobility (R26.89);Difficulty in walking, not elsewhere classified (R26.2);Pain ?Pain - Right/Left: Left ?Pain - part of body: Hip ?  ? ? ?Time: 3532-9924 ?PT Time Calculation (min) (ACUTE ONLY): 16 min ? ?Charges:  $Therapeutic  Activity: 8-22 mins          ?          ? ?[name]. Fairly IV, PT, DPT ?Physical Therapist- [place]  ?[facility]  ?08/13/2021, 3:58 PM ? ?

## 2021-08-14 DIAGNOSIS — S72002A Fracture of unspecified part of neck of left femur, initial encounter for closed fracture: Secondary | ICD-10-CM | POA: Diagnosis not present

## 2021-08-14 LAB — URINALYSIS, MICROSCOPIC (REFLEX)
Bacteria, UA: NONE SEEN
Squamous Epithelial / HPF: NONE SEEN (ref 0–5)

## 2021-08-14 LAB — CBC
HCT: 27.5 % — ABNORMAL LOW (ref 36.0–46.0)
Hemoglobin: 9.3 g/dL — ABNORMAL LOW (ref 12.0–15.0)
MCH: 30.2 pg (ref 26.0–34.0)
MCHC: 33.8 g/dL (ref 30.0–36.0)
MCV: 89.3 fL (ref 80.0–100.0)
Platelets: 245 10*3/uL (ref 150–400)
RBC: 3.08 MIL/uL — ABNORMAL LOW (ref 3.87–5.11)
RDW: 14.4 % (ref 11.5–15.5)
WBC: 12.9 10*3/uL — ABNORMAL HIGH (ref 4.0–10.5)
nRBC: 0 % (ref 0.0–0.2)

## 2021-08-14 LAB — URINALYSIS, ROUTINE W REFLEX MICROSCOPIC
Bilirubin Urine: NEGATIVE
Glucose, UA: NEGATIVE mg/dL
Leukocytes,Ua: NEGATIVE
Nitrite: NEGATIVE
Protein, ur: NEGATIVE mg/dL
Specific Gravity, Urine: 1.01 (ref 1.005–1.030)
pH: 6 (ref 5.0–8.0)

## 2021-08-14 LAB — BASIC METABOLIC PANEL
Anion gap: 4 — ABNORMAL LOW (ref 5–15)
BUN: 10 mg/dL (ref 8–23)
CO2: 25 mmol/L (ref 22–32)
Calcium: 7.8 mg/dL — ABNORMAL LOW (ref 8.9–10.3)
Chloride: 97 mmol/L — ABNORMAL LOW (ref 98–111)
Creatinine, Ser: 0.5 mg/dL (ref 0.44–1.00)
GFR, Estimated: >60 mL/min (ref >60)
Glucose, Bld: 115 mg/dL — ABNORMAL HIGH (ref 70–99)
Potassium: 3.9 mmol/L (ref 3.5–5.1)
Sodium: 126 mmol/L — ABNORMAL LOW (ref 135–145)

## 2021-08-14 LAB — TSH: TSH: 1.868 u[IU]/mL (ref 0.350–4.500)

## 2021-08-14 LAB — SODIUM, URINE, RANDOM: Sodium, Ur: <10 mmol/L

## 2021-08-14 LAB — OSMOLALITY: Osmolality: 261 mOsm/kg — ABNORMAL LOW (ref 275–295)

## 2021-08-14 LAB — OSMOLALITY, URINE: Osmolality, Ur: 315 mOsm/kg (ref 300–900)

## 2021-08-14 LAB — SURGICAL PATHOLOGY

## 2021-08-14 NOTE — Progress Notes (Signed)
Occupational Therapy Treatment ?Patient Details ?Name: Barbara Vega ?MRN: [id number] ?DOB: 1933/07/30 ?Today's Date: 08/14/2021 ? ? ?History of present illness Barbara Vega is a 88 y.o. female with medical history significant for alzheimer's dementia, cva, who presents with the above. Ambulates without assistance at baseline. Was out walking in yard with neighbors earlier today when tripped and fell landing on left hip. No other trauma no LOC. Unable to ambulate after. Denies chest pain or cough or fever. Pt is s/p L hip hemiarthroplasty 08/12/21. ?  ?OT comments ? Chart reviewed, pt greeted supine in bed agreeable to OT tx session. Co-tx completed with PT. Tx session targeted improving activity tolerance during functional mobility and ADL tasks. Pt continues to require MAX A +2 for bed mobility and SPT to bedside chair. Pt requiring MAX assist to attempt steps with RW. MOD-MAX A required for washing face, MAX A for UB dressing. Pt is left in bedside chair, NAD, all needs met  ? ?Of note: Pt appears to be coughing with swallow of thin liquids while seated upright,  in appropriate position for PO. Rn notified.  ? ?Vitals supine: 137/64 mm Hg, HR: 87 BPM ?Vitals seated: 149/60 mm Hg, HR: 90 BPM ?Vitals seated > 3 min: 136/51 mm Hg, HR: 96 BPM ?Vitals in recliner: 129/59 mm Hg, 83 BPM ?   ? ?Recommendations for follow up therapy are one component of a multi-disciplinary discharge planning process, led by the attending physician.  Recommendations may be updated based on patient status, additional functional criteria and insurance authorization. ?   ?Follow Up Recommendations ? Skilled nursing-short term rehab (<3 hours/day)  ?  ?Assistance Recommended at Discharge Frequent or constant Supervision/Assistance  ?Patient can return home with the following ? A lot of help with walking and/or transfers;A lot of help with bathing/dressing/bathroom;Direct supervision/assist for financial management;Assistance with  cooking/housework;Help with stairs or ramp for entrance;Direct supervision/assist for medications management ?  ?Equipment Recommendations ? None recommended by OT;Other (comment)  ?  ?Recommendations for Other Services Speech consult ? ?  ?Precautions / Restrictions Precautions ?Precautions: Posterior Hip ?Restrictions ?Weight Bearing Restrictions: Yes ?LLE Weight Bearing: Weight bearing as tolerated  ? ? ?  ? ?Mobility Bed Mobility ?Overal bed mobility: Needs Assistance ?Bed Mobility: Sit to Supine ?  ?  ?Supine to sit: Max assist, +2 for physical assistance ?  ?  ?  ?  ? ?Transfers ?Overall transfer level: Needs assistance ?Equipment used: Rolling walker (2 wheels) ?Transfers: Sit to/from Stand, Bed to chair/wheelchair/BSC ?Sit to Stand: Max assist, +2 physical assistance ?Stand pivot transfers: Max assist, +2 physical assistance ?  ?  ?  ?  ?General transfer comment: vcs for sequencing, pt unable to take steps ?  ?  ?Balance Overall balance assessment: Needs assistance ?Sitting-balance support: Feet supported ?Sitting balance-Leahy Scale: Poor ?  ?Postural control: Right lateral lean ?Standing balance support: Bilateral upper extremity supported, During functional activity, Reliant on assistive device for balance ?Standing balance-Leahy Scale: Poor ?  ?  ?  ?  ?  ?  ?  ?  ?  ?  ?  ?  ?   ? ?ADL either performed or assessed with clinical judgement  ? ?ADL Overall ADL's : Needs assistance/impaired ?  ?  ?  ?  ?  ?  ?  ?  ?  ?  ?  ?  ?  ?  ?  ?  ?  ?  ?  ?General ADL Comments: MOD-MAX A for washing face  seated on edge of bed, MAX A for UB dressing tasks; ?  ? ?Extremity/Trunk Assessment   ?  ?  ?  ?  ?  ? ?Vision   ?  ?  ?Perception   ?  ?Praxis   ?  ? ?Cognition Arousal/Alertness: Awake/alert ?Behavior During Therapy: Flat affect ?Overall Cognitive Status: History of cognitive impairments - at baseline ?  ?  ?  ?  ?  ?  ?  ?  ?  ?  ?  ?  ?  ?  ?  ?  ?General Comments: baseline dementia ?  ?  ?   ?Exercises   ? ?   ?Shoulder Instructions   ? ? ?  ?General Comments BP WNL throughout tx session; pt with reported indigestion throughout  ? ? ?Pertinent Vitals/ Pain       Pain Assessment ?Pain Assessment: Faces ?Faces Pain Scale: Hurts even more ?Pain Location: L hip ?Pain Descriptors / Indicators: Discomfort, Grimacing, Guarding ?Pain Intervention(s): Limited activity within patient's tolerance, Monitored during session ? ?Home Living   ?  ?  ?  ?  ?  ?  ?  ?  ?  ?  ?  ?  ?  ?  ?  ?  ?  ?  ? ?  ?Prior Functioning/Environment    ?  ?  ?  ?   ? ?Frequency ? Min 3X/week  ? ? ? ? ?  ?Progress Toward Goals ? ?OT Goals(current goals can now be found in the care plan section) ? Progress towards OT goals: Progressing toward goals ? ?Acute Rehab OT Goals ?Patient Stated Goal: feel better ?OT Goal Formulation: With patient ?Time For Goal Achievement: 08/28/21 ?Potential to Achieve Goals: Good  ?Plan Discharge plan remains appropriate   ? ?Co-evaluation ? ? ? PT/OT/SLP Co-Evaluation/Treatment: Yes ?Reason for Co-Treatment: Complexity of the patient's impairments (multi-system involvement);For patient/therapist safety;To address functional/ADL transfers ?PT goals addressed during session: Mobility/safety with mobility;Proper use of DME;Strengthening/ROM ?OT goals addressed during session: ADL's and self-care;Proper use of Adaptive equipment and DME ?  ? ?  ?AM-PAC OT "6 Clicks" Daily Activity     ?Outcome Measure ? ? Help from another person eating meals?: A Little ?Help from another person taking care of personal grooming?: A Lot ?Help from another person toileting, which includes using toliet, bedpan, or urinal?: Total ?Help from another person bathing (including washing, rinsing, drying)?: A Lot ?Help from another person to put on and taking off regular upper body clothing?: A Lot ?Help from another person to put on and taking off regular lower body clothing?: Total ?6 Click Score: 11 ? ?  ?End of Session Equipment Utilized During  Treatment: Gait belt;Rolling walker (2 wheels) ? ?OT Visit Diagnosis: Unsteadiness on feet (R26.81);Muscle weakness (generalized) (M62.81);History of falling (Z91.81) ?  ?Activity Tolerance Treatment limited secondary to medical complications (Comment) ?  ?Patient Left in chair;with call bell/phone within reach;with chair alarm set;with nursing/sitter in room ?  ?Nurse Communication Mobility status;Precautions ?  ? ?   ? ?Time: [phone] ?OT Time Calculation (min): 26 min ? ?Charges: OT General Charges ?$OT Visit: 1 Visit ?OT Treatments ?$Self Care/Home Management : 8-22 mins ? ?Shanon Payor, OTD OTR/L  ?08/14/21, 10:16 AM  ?

## 2021-08-14 NOTE — TOC Progression Note (Addendum)
Transition of Care (TOC) - Progression Note  ? ? ?Patient Details  ?Name: Jabrea Kallstrom Krull ?MRN: [id number] ?Date of Birth: [birth year] ? ?Transition of Care (TOC) CM/SW Contact  ?Candie Chroman, LCSW ?Phone Number: ?08/14/2021, 11:12 AM ? ?Clinical Narrative: [facility] can accept patient today if auth approved. Started auth through [facility] portal.   ? ?1:01 pm: Auth approved: [id number]. Valid 3/31-4/4. Per MD, likely discharge tomorrow due to sodium level. Daughter and SNF admissions coordinator are aware. ? ?Expected Discharge Plan: [place] ?Barriers to Discharge: Continued Medical Work up, SNF Pending bed offer, Awaiting State Approval ([place]) ? ?Expected Discharge Plan and Services ?Expected Discharge Plan: [place] ?  ?  ?  ?  ?                ?  ?  ?  ?  ?  ?  ?  ?  ?  ?  ? ? ?Social Determinants of Health (SDOH) Interventions ?  ? ?Readmission Risk Interventions ?   ? View : No data to display.  ?  ?  ?  ? ? ?

## 2021-08-14 NOTE — Care Management Important Message (Signed)
Important Message ? ?Patient Details  ?Name: Barbara Vega ?MRN: [id number] ?Date of Birth: [birth year] ? ? ?Medicare Important Message Given:  Yes ? ? ? ? ?Barbara Vega ?08/14/2021, 11:24 AM ?

## 2021-08-14 NOTE — Progress Notes (Signed)
Clinical/Bedside Swallow Evaluation ?Patient Details  ?Name: Barbara Vega ?MRN: [id number] ?Date of Birth: [birth year] ? ?Today's Date: 08/14/2021 ?Time: SLP Start Time (ACUTE ONLY): 1100 SLP Stop Time (ACUTE ONLY): 1130 ?SLP Time Calculation (min) (ACUTE ONLY): 30 min ? ?Past Medical History:  ?Past Medical History:  ?Diagnosis Date  ? Adenomatous colon polyp   ? Anemia   ? hx  ? Arthritis   ? Benign breast lumps   ? History of colon polyps   ? benign  ? Hypertension   ? takes Triamterene-HCTZ daily  ? Insomnia   ? takes Restoril nightly  ? Macular degeneration   ? dry  ? Osteoporosis   ? Sinus infection   ? taking Ceftin daily   ? ?Past Surgical History:  ?Past Surgical History:  ?Procedure Laterality Date  ? ABDOMINAL HYSTERECTOMY    ? BREAST EXCISIONAL BIOPSY    ? BREAST LUMPECTOMY Right 09/08/2015  ? intraductal papilloma  ? BREAST LUMPECTOMY Left   ? 2 bx done only one scar seen years ago neg  ? cataract surgery Bilateral   ? COLONOSCOPY    ? COLONOSCOPY WITH PROPOFOL N/A 11/03/2016  ? Procedure: COLONOSCOPY WITH PROPOFOL;  Surgeon: Manya Silvas, MD;  Location: [facility] END[facility]COPY;  Service: Endoscopy;  Laterality: N/A;  ? ESOPHAGOGASTRODUODENOSCOPY (EGD) WITH PROPOFOL N/A 11/03/2016  ? Procedure: ESOPHAGOGASTRODUODENOSCOPY (EGD) WITH PROPOFOL;  Surgeon: Manya Silvas, MD;  Location: [facility] ENDOSCOPY;  Service: Endoscopy;  Laterality: N/A;  ? HIP ARTHROPLASTY Left 08/12/2021  ? Procedure: ARTHROPLASTY BIPOLAR HIP (HEMIARTHROPLASTY);  Surgeon: Thornton Park, MD;  Location: [facility] ORS;  Service: Orthopedics;  Laterality: Left;  ? INGUINAL HERNIA REPAIR Left 01/15/2017  ? Procedure: HERNIA REPAIR INGUINAL ADULT;  Surgeon: Florene Glen, MD;  Location: [facility] ORS;  Service: General;  Laterality: Left;  ? KYPHOPLASTY N/A 05/15/2019  ? Procedure: KYPHOPLASTY T12;  Surgeon: Hessie Knows, MD;  Location: [facility] ORS;  Service: Orthopedics;  Laterality: N/A;  ? KYPHOPLASTY N/A 06/01/2019  ? Procedure: T11 KYPHOPLASTY;   Surgeon: Hessie Knows, MD;  Location: [facility] ORS;  Service: Orthopedics;  Laterality: N/A;  ? RADIOACTIVE SEED GUIDED EXCISIONAL BREAST BIOPSY Right 09/08/2015  ? Procedure: RADIOACTIVE SEED GUIDED EXCISIONAL BREAST BIOPSY;  Surgeon: Rolm Bookbinder, MD;  Location: [place];  Service: General;  Laterality: Right;  ? ?HPI:  ?Pt is an 88 y.o. female with past medical history including Alzheimer's dementia, CVA, GERD, esophageal dysphagia, HTN who presented with a fall, now s/p hip hemiarthroplasty 08/12/21. OT, PT noted pt coughing with intake. RN reports pt holding food in her mouth (banana). Pt has extensive esophageal history with multiple dilations, most recently in June 2018 per records. Barium swallow on 08/13/16 findings: "small sliding hiatal hernia with esophageal B ring, minimal reflux. CXR on admission showed no active disease.  ?  ?Assessment / Plan / Recommendation  ?Clinical Impression ?  Patient presents with signs consistent with esophageal vs oropharyngeal dysphagia, although due to dementia does occasionally exhibit oral holding (per daughter), which was not observed today. Daughter reports longstanding history of pt complaining of "choking" which she reports is an anxious behavior. She has had numerous esophageal workups. Pt makes what appear to be behavioral grunts prior to and during swallow initiation that do not appear to indicate airway compromise. Pt occasionally grimaces and points to her clavicle, followed by belching and delayed cough. Daughter reports pt is a picky eater, typically manages well with soft foods, but that pt also able to eat potato chips and tortilla  chips, as well as other regular texture preferred foods. Patient was able to self-feed with encouragement and occasional min-mod assistance. Pt was noted to masticate preferred foods and clear oral cavity (crackers with peanut butter) with liquid wash. No overt s/sx aspiration; vocal quality remains clear, vitals stable, although pt  did continue to make behavioral "grunts" and belch throughout the assessment. Daughter demonstrates good understanding of selecting appropriate foods and modifying textures as needed depending on pt's level of cognitive functioning. Recommend continuing regular diet so as not to restrict pt from preferred foods as daughter is present for all meals, choosing appropriate textures and chopping meats as needed. Upright posture during and 30 minutes after meals as reflux precaution. No further ST indicated at this time. Discussed follow-up with GI as outpatient; daughter wishes to minimize interventions at this time but verbalizes understanding and intention to follow-up with GI if necessary. ? ? ? ?SLP Visit Diagnosis: Dysphagia, pharyngoesophageal phase (R13.14) ?   ?Aspiration Risk ? Mild aspiration risk  ?  ?Diet Recommendation Regular/soft solids;Thin liquid  ? ?Liquid Administration via: Cup;Straw ?Medication Administration: Whole meds with puree ?Supervision: Patient able to self feed;Staff to assist with self feeding (encourage pt to self-feed as able) ?Compensations: Minimize environmental distractions;Slow rate;Small sips/bites ?Postural Changes: Seated upright at 90 degrees;Remain upright for at least 30 minutes after po intake  ?  ?Other  Recommendations Recommended Consults: Other (Comment) (discussed follow-up with GI as outpatient, daughter would like to limit unnessary interventions but will follow up if desired) ?Oral Care Recommendations: Oral care BID   ? ?Recommendations for follow up therapy are one component of a multi-disciplinary discharge planning process, led by the attending physician.  Recommendations may be updated based on patient status, additional functional criteria and insurance authorization. ? ?Follow up Recommendations Skilled nursing-short term rehab (<3 hours/day)  ? ? ?  ?Assistance Recommended at Discharge Frequent or constant Supervision/Assistance  ?Functional Status Assessment  Patient has not had a recent decline in their functional status (no decline with swallowing; baseline per daughter)  ?Frequency and Duration    ?  ?  ?   ? ?Prognosis    ? ?  ? ?Swallow Study   ?General Date of Onset: 08/11/21 ?HPI: Pt is an 88 y.o. female with past medical history including Alzheimer's dementia, CVA, GERD, esophageal dysphagia, HTN who presented with a fall, now s/p hip hemiarthroplasty 08/12/21. OT, PT noted pt coughing with intake. RN reports pt holding food in her mouth (banana). Pt has extensive esophageal history with multiple dilations, most recently in June 2018 per records. Barium swallow on 08/13/16 findings: "small sliding hiatal hernia with esophageal B ring, minimal reflux. CXR on admission showed no active disease. ?Type of Study: Bedside Swallow Evaluation ?Previous Swallow Assessment: see HPI ?Diet Prior to this Study: Regular;Thin liquids ?Temperature Spikes Noted: No ?Respiratory Status: Room air ?History of Recent Intubation: No ?Behavior/Cognition: Alert;Cooperative ?Oral Cavity Assessment: Within Functional Limits ?Oral Care Completed by SLP: No ?Oral Cavity - Dentition: Dentures, top ?Vision: Functional for self-feeding ?Self-Feeding Abilities: Able to feed self;Needs set up;Needs assist ?Patient Positioning: Upright in bed ?Baseline Vocal Quality: Normal ?Volitional Cough: Cognitively unable to elicit ?Volitional Swallow:  (cognitively unable to elicit)  ?  ?Oral/Motor/Sensory Function Overall Oral Motor/Sensory Function: Within functional limits   ?Ice Chips Ice chips: Not tested ?Other Comments: pt declined   ?Thin Liquid Thin Liquid: Within functional limits ?Presentation: Cup;Straw;Self Fed ?Other Comments: belching and delayed cough suggestive of esophageal dysphagia given history. pt also makes  behavioral "grunts" when swallowing  ?  ?Nectar Thick Nectar Thick Liquid: Not tested   ?Honey Thick Honey Thick Liquid: Not tested   ?Puree Puree: Within functional  limits ?Presentation: Self Fed;Spoon   ?Solid ? ? ?  Solid: Within functional limits ?Presentation: Self Fed  ? ?  ?Deneise Lever, MS, CCC-SLP ?Speech-Language Pathologist ?Office: [phone] ?ASCOM: [phone] ? ?

## 2021-08-14 NOTE — Progress Notes (Signed)
?PROGRESS NOTE ? ? ? ?Barbara Vega  ATF:[id number] DOB: January 13, 1934 DOA: 08/11/2021 ?PCP: [name], MD  ? ? ? ? ?Brief Narrative:  ? ?Presented after mechanical fall at home, found to have left hip fracture.  ? ? ?Assessment & Plan: ?  ?Principal Problem: ?  Hip fracture ([place]) ?Active Problems: ?  Osteoporosis, post-menopausal ?  Essential hypertension ?  Alzheimer's dementia without behavioral disturbance ([place]) ?  Stroke [facility]) ? ? ?# Close traumatic left femoral neck fracture ?Mechanical fall. Osteoporotic, hx dysphagia, has had mechanical dilation in 2018. No known cardiac history. S/p left hip hemiarthroplasty 3/29 with ortho ?- pain control, bowel regimen. Had a BM this morning ?- PT/OT consulted, PT advising SNF, TOC at work on that ?- outpt endo f/u for osteoporosis tx, bisphosphonates contraindicated given swallow disorder ? ?# Dyspnagia ?This morning choked a bit on banana. No respiratory distress ?- SLP swallow eval ordered ? ?# Orthostasis ?Near syncope when up out of bed to chair today with PT on 3/30. BPs since soft with MAPs in the low 60s. Improved now with time and fluids ?- stop fluids, monitor ?  ?# Alzheimer's dementia ?Calm ?- hold aricept given hypotension, holding depakote given hyponatremia ? ?# Hyponatremia ?Chronic, baseline appears to be upper 120s, low 130s. Was 134 on presentation, 126 today. IV resuscitation likely contributing. Likely baseline siadh ?- hold depoake ?- stop fluids ?- f/u urine osm and sodium, tsh, morning cortisol, and serum osm ?  ?# Hx cva ?Cont home statin, aspirin  ? ? ?DVT prophylaxis: lovenox ?Code Status: dnr ?Family Communication: daughter updated @ bedside 3/31 ? ?Level of care: Med-Surg ?Status is: Inpatient ?Remains inpatient appropriate because: severity of illness ? ? ? ?Consultants:  ?orthopedics ? ?Procedures: ?pending ? ?Antimicrobials:  ?Surgical ppx per ortho  ? ? ?Subjective: ?Complains of chronic nausea. No dyspnea or cough. Left hip  pain ? ?Objective: ?Vitals:  ? 08/13/21 2026 08/13/21 2357 08/14/21 0506 08/14/21 0743  ?BP: 120/65 (!) 128/52 (!) 129/53 99/84  ?Pulse: 85 83 90 98  ?Resp: '20 16 16 18  '$ ?Temp: 98.5 ?F (36.9 ?C) 98.2 ?F (36.8 ?C) 98.9 ?F (37.2 ?C) 98.4 ?F (36.9 ?C)  ?TempSrc: Oral     ?SpO2: 95% 93% 93% 94%  ?Weight:      ?Height:      ? ? ?Intake/Output Summary (Last 24 hours) at 08/14/2021 1025 ?Last data filed at 08/14/2021 0600 ?Gross per 24 hour  ?Intake 1177.79 ml  ?Output 400 ml  ?Net 777.79 ml  ? ?Filed Weights  ? 08/12/21 1537  ?Weight: 59 kg  ? ? ?Examination: ? ?Constitutional: mild distress ?Head: Atraumatic ?Respiratory: Clear to auscultation bilaterally, no wheezing/rales/rhonchi. Normal respiratory effort. No accessory muscle use. Marland Kitchen ?Cardiovascular: Regular rate and rhythm. No murmurs/rubs/gallops. ?Abdomen: Non-tender, non-distended. No masses. No rebound or guarding. Positive bowel sounds. ?Musculoskeletal: external rotation left leg. Dressing in place left lateral hip. ?Skin: No rashes, lesions, or ulcers.  ?Extremities: trace LE edema, warm ?Neurologic: moving all 4 extremities. ?Psychiatric: calm ? ? ? ?Data Reviewed: I have personally reviewed following labs and imaging studies ? ?CBC: ?Recent Labs  ?Lab 08/11/21 ?1427 08/12/21 ?0448 08/13/21 ?2706 08/14/21 ?2376  ?WBC 11.7* 10.7* 13.0* 12.9*  ?NEUTROABS 9.6*  --   --   --   ?HGB 16.1* 14.6 11.5* 9.3*  ?HCT 49.5* 43.8 34.4* 27.5*  ?MCV 90.2 87.6 88.4 89.3  ?PLT 330 317 277 245  ? ?Basic Metabolic Panel: ?Recent Labs  ?Lab 08/11/21 ?1427 08/12/21 ?  7078 08/13/21 ?6754 08/14/21 ?4920  ?NA 134*  --  128* 126*  ?K 4.4  --  3.4* 3.9  ?CL 101  --  96* 97*  ?CO2 22  --  25 25  ?GLUCOSE 125*  --  138* 115*  ?BUN 15  --  13 10  ?CREATININE 0.73 0.53 0.63 0.50  ?CALCIUM 9.0  --  8.0* 7.8*  ? ?GFR: ?Estimated Creatinine Clearance: 41.3 mL/min (by C-G formula based on SCr of 0.5 mg/dL). ?Liver Function Tests: ?No results for input(s): AST, ALT, ALKPHOS, BILITOT, PROT, ALBUMIN  in the last 168 hours. ?No results for input(s): LIPASE, AMYLASE in the last 168 hours. ?No results for input(s): AMMONIA in the last 168 hours. ?Coagulation Profile: ?Recent Labs  ?Lab 08/11/21 ?1427  ?INR 1.1  ? ?Cardiac Enzymes: ?No results for input(s): CKTOTAL, CKMB, CKMBINDEX, TROPONINI in the last 168 hours. ?BNP (last 3 results) ?No results for input(s): PROBNP in the last 8760 hours. ?HbA1C: ?No results for input(s): HGBA1C in the last 72 hours. ?CBG: ?No results for input(s): GLUCAP in the last 168 hours. ?Lipid Profile: ?No results for input(s): CHOL, HDL, LDLCALC, TRIG, CHOLHDL, LDLDIRECT in the last 72 hours. ?Thyroid Function Tests: ?No results for input(s): TSH, T4TOTAL, FREET4, T3FREE, THYROIDAB in the last 72 hours. ?Anemia Panel: ?No results for input(s): VITAMINB12, FOLATE, FERRITIN, TIBC, IRON, RETICCTPCT in the last 72 hours. ?Urine analysis: ?   ?Component Value Date/Time  ? COLORURINE YELLOW 08/14/2021 0212  ? APPEARANCEUR CLEAR 08/14/2021 0212  ? APPEARANCEUR Cloudy (A) 12/20/2016 1304  ? LABSPEC 1.010 08/14/2021 0212  ? PHURINE 6.0 08/14/2021 0212  ? GLUCOSEU NEGATIVE 08/14/2021 0212  ? HGBUR MODERATE (A) 08/14/2021 0212  ? [place] NEGATIVE 08/14/2021 0212  ? BILIRUBINUR Negative 12/20/2016 1304  ? KETONESUR TRACE (A) 08/14/2021 1007  ? [place] NEGATIVE 08/14/2021 0212  ? NITRITE NEGATIVE 08/14/2021 0212  ? LEUKOCYTESUR NEGATIVE 08/14/2021 0212  ? ?Sepsis Labs: ?'@LABRCNTIP'$ (procalcitonin:4,lacticidven:4) ? ?) ?Recent Results (from the past 240 hour(s))  ?Resp Panel by RT-PCR (Flu A&B, Covid) Nasopharyngeal Swab     Status: None  ? Collection Time: 08/11/21  2:27 PM  ? Specimen: Nasopharyngeal Swab; Nasopharyngeal(NP) swabs in vial transport medium  ?Result Value Ref Range Status  ? SARS Coronavirus 2 by RT PCR NEGATIVE NEGATIVE Final  ?  Comment: (NOTE) ?SARS-CoV-2 target nucleic acids are NOT DETECTED. ? ?The SARS-CoV-2 RNA is generally detectable in upper respiratory ?specimens during  the acute phase of infection. The lowest ?concentration of SARS-CoV-2 viral copies this assay can detect is ?138 copies/mL. A negative result does not preclude SARS-Cov-2 ?infection and should not be used as the sole basis for treatment or ?other patient management decisions. A negative result may occur with  ?improper specimen collection/handling, submission of specimen other ?than nasopharyngeal swab, presence of viral mutation(s) within the ?areas targeted by this assay, and inadequate number of viral ?copies(<138 copies/mL). A negative result must be combined with ?clinical observations, patient history, and epidemiological ?information. The expected result is Negative. ? ?Fact Sheet for Patients:  ?EntrepreneurPulse.com.au ? ?Fact Sheet for Healthcare Providers:  ?IncredibleEmployment.be ? ?This test is no t yet approved or cleared by the [place] FDA and  ?has been authorized for detection and/or diagnosis of SARS-CoV-2 by ?FDA under an Emergency Use Authorization (EUA). This EUA will remain  ?in effect (meaning this test can be used) for the duration of the ?COVID-19 declaration under Section 564(b)(1) of the Act, 21 ?U.S.C.section 360bbb-3(b)(1), unless the authorization is terminated  ?  or revoked sooner.  ? ? ?  ? Influenza A by PCR NEGATIVE NEGATIVE Final  ? Influenza B by PCR NEGATIVE NEGATIVE Final  ?  Comment: (NOTE) ?The Xpert Xpress SARS-CoV-2/FLU/RSV plus assay is intended as an aid ?in the diagnosis of influenza from Nasopharyngeal swab specimens and ?should not be used as a sole basis for treatment. Nasal washings and ?aspirates are unacceptable for Xpert Xpress SARS-CoV-2/FLU/RSV ?testing. ? ?Fact Sheet for Patients: ?EntrepreneurPulse.com.au ? ?Fact Sheet for Healthcare Providers: ?IncredibleEmployment.be ? ?This test is not yet approved or cleared by the [place] FDA and ?has been authorized for detection and/or  diagnosis of SARS-CoV-2 by ?FDA under an Emergency Use Authorization (EUA). This EUA will remain ?in effect (meaning this test can be used) for the duration of the ?COVID-19 declaration under Section 564(b)(1) of the

## 2021-08-14 NOTE — Progress Notes (Signed)
Physical Therapy Treatment ?Patient Details ?Name: Barbara Vega ?MRN: [id number] ?DOB: [birth year] ?Today's Date: 08/14/2021 ? ? ?History of Present Illness Barbara Vega is a 88 y.o. female with medical history significant for alzheimer's dementia, cva, who presents with the above. Ambulates without assistance at baseline. Was out walking in yard with neighbors earlier today when tripped and fell landing on left hip. No other trauma no LOC. Unable to ambulate after. Denies chest pain or cough or fever. Pt is s/p L hip hemiarthroplasty 08/12/21. ? ?  ?PT Comments  ? ? Pt received upright in bed agreeable to PT/OT co-treat. Pt demonstrating difficulty swallowing piece of banana reporting consistent indigestion throughout session. RN made aware for possible SLP eval. Pt re-educated on post hip precautions overall requiring maxA+2 for bed mobility, STS and stand pivot transfer from bed to recliner with significant weight shift over onto RLE to off load painful LLE. Pt's LE's positioned with pillows to maintain post hip precautions. Attempts made to initiate LE therex with packet provided but pt appearing tired and lethargic closing her eyes wanting to sleep. Will attempt therex in afternoon session. Anticipate after today decreasing pt's frequency to QD due to poor tolerance for OOB mobility due to lethargy, pain, and cognition. ?  ?Vitals supine: 137/64 mm Hg, HR: 87 BPM ?Vitals seated: 149/60 mm Hg, HR: 90 BPM ?Vitals seated > 3 min: 136/51 mm Hg, HR: 96 BPM ?Vitals in recliner: 129/59 mm Hg, 83 BPM ? ?  ?Recommendations for follow up therapy are one component of a multi-disciplinary discharge planning process, led by the attending physician.  Recommendations may be updated based on patient status, additional functional criteria and insurance authorization. ? ?Follow Up Recommendations ? Skilled nursing-short term rehab (<3 hours/day) ?  ?  ?Assistance Recommended at Discharge Frequent or constant Supervision/Assistance   ?Patient can return home with the following Two people to help with walking and/or transfers;Two people to help with bathing/dressing/bathroom;Help with stairs or ramp for entrance;Assist for transportation;Assistance with cooking/housework ?  ?Equipment Recommendations ? Other (comment) (next venue of care)  ?  ?Recommendations for Other Services   ? ? ?  ?Precautions / Restrictions Precautions ?Precautions: Posterior Hip ?Precaution Booklet Issued: Yes (comment) ?Restrictions ?Weight Bearing Restrictions: Yes ?LLE Weight Bearing: Weight bearing as tolerated  ?  ? ?Mobility ? Bed Mobility ?Overal bed mobility: Needs Assistance ?Bed Mobility: Sit to Supine ?  ?  ?Supine to sit: Max assist, +2 for physical assistance ?Sit to supine: Max assist, +2 for physical assistance ?  ?  ?Patient Response: Flat affect, Cooperative ? ?Transfers ?Overall transfer level: Needs assistance ?Equipment used: Rolling walker (2 wheels) ?Transfers: Sit to/from Stand, Bed to chair/wheelchair/BSC ?Sit to Stand: Max assist, +2 physical assistance ?Stand pivot transfers: Max assist, +2 physical assistance ?  ?  ?  ?  ?General transfer comment: vcs for sequencing, pt unable to take steps ?  ? ?Ambulation/Gait ?  ?  ?  ?  ?  ?  ?  ?  ? ? ?Stairs ?  ?  ?  ?  ?  ? ? ?Wheelchair Mobility ?  ? ?Modified Rankin (Stroke Patients Only) ?  ? ? ?  ?Balance Overall balance assessment: Needs assistance ?Sitting-balance support: Feet supported ?Sitting balance-Leahy Scale: Poor ?  ?Postural control: Right lateral lean ?Standing balance support: Bilateral upper extremity supported, During functional activity, Reliant on assistive device for balance ?Standing balance-Leahy Scale: Poor ?Standing balance comment: Reliant on AD, decreased WB'ing on LLE ?  ?  ?  ?  ?  ?  ?  ?  ?  ?  ?  ?  ? ?  ?  Cognition Arousal/Alertness: Awake/alert ?Behavior During Therapy: Flat affect ?Overall Cognitive Status: History of cognitive impairments - at baseline ?  ?  ?  ?  ?   ?  ?  ?  ?  ?  ?  ?  ?  ?  ?  ?  ?General Comments: baseline dementia ?  ?  ? ?  ?Exercises Total Joint Exercises ?Ankle Circles/Pumps: AROM, 10 reps, Seated ?Other Exercises ?Other Exercises: Reinforcement on posterior hip precautions ? ?  ?General Comments General comments (skin integrity, edema, etc.): BP WNL. Pt reporting indigestion ?  ?  ? ?Pertinent Vitals/Pain Pain Assessment ?Pain Assessment: Faces ?Faces Pain Scale: Hurts even more ?Pain Location: L hip with movement ?Pain Descriptors / Indicators: Discomfort, Grimacing, Guarding ?Pain Intervention(s): Limited activity within patient's tolerance, Monitored during session, Repositioned  ? ? ?Home Living   ?  ?  ?  ?  ?  ?  ?  ?  ?  ?   ?  ?Prior Function    ?  ?  ?   ? ?PT Goals (current goals can now be found in the care plan section) Acute Rehab PT Goals ?PT Goal Formulation: Patient unable to participate in goal setting ? ?  ?Frequency ? ? ? BID ? ? ? ?  ?PT Plan Current plan remains appropriate  ? ? ?Co-evaluation PT/OT/SLP Co-Evaluation/Treatment: Yes ?Reason for Co-Treatment: Complexity of the patient's impairments (multi-system involvement);For patient/therapist safety;To address functional/ADL transfers ?PT goals addressed during session: Mobility/safety with mobility;Proper use of DME;Strengthening/ROM ?OT goals addressed during session: ADL's and self-care;Proper use of Adaptive equipment and DME ?  ? ?  ?AM-PAC PT "6 Clicks" Mobility   ?Outcome Measure ? Help needed turning from your back to your side while in a flat bed without using bedrails?: A Lot ?Help needed moving from lying on your back to sitting on the side of a flat bed without using bedrails?: A Lot ?Help needed moving to and from a bed to a chair (including a wheelchair)?: A Lot ?Help needed standing up from a chair using your arms (e.g., wheelchair or bedside chair)?: A Lot ?Help needed to walk in hospital room?: Total ?Help needed climbing 3-5 steps with a railing? : Total ?6  Click Score: 10 ? ?  ?End of Session Equipment Utilized During Treatment: Gait belt ?Activity Tolerance: Patient limited by pain ?Patient left: in chair;with call bell/phone within reach;with chair alarm set;with family/visitor present ?Nurse Communication: Mobility status ?PT Visit Diagnosis: Unsteadiness on feet (R26.81);Muscle weakness (generalized) (M62.81);History of falling (Z91.81);Other abnormalities of gait and mobility (R26.89);Difficulty in walking, not elsewhere classified (R26.2);Pain ?Pain - Right/Left: Left ?Pain - part of body: Hip ?  ? ? ?Time: 5947-0761 ?PT Time Calculation (min) (ACUTE ONLY): 27 min ? ?Charges:  $Therapeutic Activity: 8-22 mins          ?          ? ?[name]. Fairly IV, PT, DPT ?Physical Therapist- [place]  ?[facility]  ?08/14/2021, 10:55 AM ? ?

## 2021-08-14 NOTE — Progress Notes (Signed)
?Subjective: ? ?POD #2 s/p left hip hemiarthroplasty.   Patient reports left hip pain as mild.  Pain improved today.  Patient is only requiring tylenol.   ? ?Objective:  ? ?VITALS:   ?Vitals:  ? 08/13/21 2026 08/13/21 2357 08/14/21 0506 08/14/21 0743  ?BP: 120/65 (!) 128/52 (!) 129/53 99/84  ?Pulse: 85 83 90 98  ?Resp: '20 16 16 18  '$ ?Temp: 98.5 ?F (36.9 ?C) 98.2 ?F (36.8 ?C) 98.9 ?F (37.2 ?C) 98.4 ?F (36.9 ?C)  ?TempSrc: Oral     ?SpO2: 95% 93% 93% 94%  ?Weight:      ?Height:      ? ? ?PHYSICAL EXAM: ?Left lower extremity ?Neurovascular intact ?Sensation intact distally ?Intact pulses distally ?Dorsiflexion/Plantar flexion intact ?Incision: moderate drainage ?No cellulitis present ?Compartment soft ? ?LABS ? ?Results for orders placed or performed during the hospital encounter of 08/11/21 (from the past 24 hour(s))  ?Urinalysis, Routine w reflex microscopic Urine, In & Out Cath     Status: Abnormal  ? Collection Time: 08/14/21  2:12 AM  ?Result Value Ref Range  ? Color, Urine YELLOW YELLOW  ? APPearance CLEAR CLEAR  ? Specific Gravity, Urine 1.010 1.005 - 1.030  ? pH 6.0 5.0 - 8.0  ? Glucose, UA NEGATIVE NEGATIVE mg/dL  ? Hgb urine dipstick MODERATE (A) NEGATIVE  ? Bilirubin Urine NEGATIVE NEGATIVE  ? Ketones, ur TRACE (A) NEGATIVE mg/dL  ? Protein, ur NEGATIVE NEGATIVE mg/dL  ? Nitrite NEGATIVE NEGATIVE  ? Leukocytes,Ua NEGATIVE NEGATIVE  ?Urinalysis, Microscopic (reflex)     Status: None  ? Collection Time: 08/14/21  2:12 AM  ?Result Value Ref Range  ? RBC / HPF 0-5 0 - 5 RBC/hpf  ? WBC, UA 0-5 0 - 5 WBC/hpf  ? Bacteria, UA NONE SEEN NONE SEEN  ? Squamous Epithelial / LPF NONE SEEN 0 - 5  ? Mucus PRESENT   ?Sodium, urine, random     Status: None  ? Collection Time: 08/14/21  2:12 AM  ?Result Value Ref Range  ? Sodium, Ur <10 mmol/L  ?Basic metabolic panel     Status: Abnormal  ? Collection Time: 08/14/21  5:49 AM  ?Result Value Ref Range  ? Sodium 126 (L) 135 - 145 mmol/L  ? Potassium 3.9 3.5 - 5.1 mmol/L  ?  Chloride 97 (L) 98 - 111 mmol/L  ? CO2 25 22 - 32 mmol/L  ? Glucose, Bld 115 (H) 70 - 99 mg/dL  ? BUN 10 8 - 23 mg/dL  ? Creatinine, Ser 0.50 0.44 - 1.00 mg/dL  ? Calcium 7.8 (L) 8.9 - 10.3 mg/dL  ? GFR, Estimated >60 >60 mL/min  ? Anion gap 4 (L) 5 - 15  ?CBC     Status: Abnormal  ? Collection Time: 08/14/21  5:49 AM  ?Result Value Ref Range  ? WBC 12.9 (H) 4.0 - 10.5 K/uL  ? RBC 3.08 (L) 3.87 - 5.11 MIL/uL  ? Hemoglobin 9.3 (L) 12.0 - 15.0 g/dL  ? HCT 27.5 (L) 36.0 - 46.0 %  ? MCV 89.3 80.0 - 100.0 fL  ? MCH 30.2 26.0 - 34.0 pg  ? MCHC 33.8 30.0 - 36.0 g/dL  ? RDW 14.4 11.5 - 15.5 %  ? Platelets 245 150 - 400 K/uL  ? nRBC 0.0 0.0 - 0.2 %  ?Osmolality     Status: Abnormal  ? Collection Time: 08/14/21  5:50 AM  ?Result Value Ref Range  ? Osmolality 261 (L) 275 - 295 mOsm/kg  ?  TSH     Status: None  ? Collection Time: 08/14/21  5:50 AM  ?Result Value Ref Range  ? TSH 1.868 0.350 - 4.500 uIU/mL  ? ? ?DG Hip Port Unilat With Pelvis 1V Left ? ?Result Date: 08/12/2021 ?CLINICAL DATA:  Status post left hip hemiarthroplasty. EXAM: DG HIP (WITH OR WITHOUT PELVIS) 1V PORT LEFT COMPARISON:  August 11, 2021 FINDINGS: A left hip replacement is seen which represents a new finding when compared to the prior study. There is no evidence of surrounding lucency to suggest the presence of hardware loosening. There is no evidence of an acute hip fracture or dislocation. A mild amount of soft tissue air is seen along the lateral aspect of the left hip. IMPRESSION: Status post left hip replacement without evidence of hardware complication. Electronically Signed   By: Virgina Norfolk M.D.   On: 08/12/2021 19:19   ? ?Assessment/Plan: ?2 Days Post-Op  ? ?Principal Problem: ?  Hip fracture ([place]) ?Active Problems: ?  Osteoporosis, post-menopausal ?  Essential hypertension ?  Alzheimer's dementia without behavioral disturbance ([place]) ?  Stroke [facility]) ? ? ?Continue PT.  WBAT on left lower extremity.  Posterior hip precautions. ?Patient may be  discharged tomorrow from an orthopaedic standpoint.  ?Follow up in the office in 10-14 days.   ?Recommend lovenox or aspirin 81 mg PO BID for DVT prophylaxis until follow up. ? ? ?Thornton Park , MD ?08/14/2021, 1:22 PM ? ? ? ? ?  ?

## 2021-08-14 NOTE — Progress Notes (Signed)
Physical Therapy Treatment ?Patient Details ?Name: Barbara Vega ?MRN: [id number] ?DOB: 1934/01/20 ?Today's Date: 08/14/2021 ? ? ?History of Present Illness Barbara Vega is a 88 y.o. female with medical history significant for alzheimer's dementia, cva, who presents with the above. Ambulates without assistance at baseline. Was out walking in yard with neighbors earlier today when tripped and fell landing on left hip. No other trauma no LOC. Unable to ambulate after. Denies chest pain or cough or fever. Pt is s/p L hip hemiarthroplasty 08/12/21. ? ?  ?PT Comments  ? ? Focus of afternoon session on education and performance on HEP packet exercises. Pt and daughter educated on reps, sets, frequency. Pt requiring nearly AAROM and max multimodal cuing for all exercises due to pain, lethargy, and cognition. Performed to pt tolerance with intermittent rest breaks due to grimacing from pain. Pt repositioned to maintain post hip precautions. Frequency changed to QD due to poor tolerance for OOB activity. D/c recs remain appropriate at this time.  ?   ?Recommendations for follow up therapy are one component of a multi-disciplinary discharge planning process, led by the attending physician.  Recommendations may be updated based on patient status, additional functional criteria and insurance authorization. ? ?Follow Up Recommendations ? Skilled nursing-short term rehab (<3 hours/day) ?  ?  ?Assistance Recommended at Discharge Frequent or constant Supervision/Assistance  ?Patient can return home with the following Two people to help with walking and/or transfers;Two people to help with bathing/dressing/bathroom;Help with stairs or ramp for entrance;Assist for transportation;Assistance with cooking/housework ?  ?Equipment Recommendations ? Other (comment) (next venue of care)  ?  ?Recommendations for Other Services   ? ? ?  ?Precautions / Restrictions Precautions ?Precautions: Posterior Hip ?Precaution Booklet Issued: Yes  (comment) ?Restrictions ?Weight Bearing Restrictions: Yes ?LLE Weight Bearing: Weight bearing as tolerated  ?  ? ?Mobility ? Bed Mobility ?Overal bed mobility: Needs Assistance ?Bed Mobility: Sit to Supine ?  ?  ?Supine to sit: Max assist, +2 for physical assistance ?Sit to supine: Max assist, +2 for physical assistance ?  ?  ?Patient Response: Flat affect, Cooperative ? ?Transfers ?Overall transfer level: Needs assistance ?Equipment used: Rolling walker (2 wheels) ?Transfers: Sit to/from Stand, Bed to chair/wheelchair/BSC ?Sit to Stand: Max assist, +2 physical assistance ?Stand pivot transfers: Max assist, +2 physical assistance ?  ?  ?  ?  ?General transfer comment: vcs for sequencing, pt unable to take steps ?  ? ?Ambulation/Gait ?  ?  ?  ?  ?  ?  ?  ?  ? ? ?Stairs ?  ?  ?  ?  ?  ? ? ?Wheelchair Mobility ?  ? ?Modified Rankin (Stroke Patients Only) ?  ? ? ?  ?Balance Overall balance assessment: Needs assistance ?Sitting-balance support: Feet supported ?Sitting balance-Leahy Scale: Poor ?  ?Postural control: Right lateral lean ?Standing balance support: Bilateral upper extremity supported, During functional activity, Reliant on assistive device for balance ?Standing balance-Leahy Scale: Poor ?Standing balance comment: Reliant on AD, decreased WB'ing on LLE ?  ?  ?  ?  ?  ?  ?  ?  ?  ?  ?  ?  ? ?  ?Cognition Arousal/Alertness: Awake/alert ?Behavior During Therapy: Flat affect ?Overall Cognitive Status: History of cognitive impairments - at baseline ?  ?  ?  ?  ?  ?  ?  ?  ?  ?  ?  ?  ?  ?  ?  ?  ?General Comments: baseline dementia ?  ?  ? ?  ?  Exercises Total Joint Exercises ?Ankle Circles/Pumps: AROM, 10 reps, Seated ?Quad Sets: Left, 10 reps, Supine ?Gluteal Sets: AROM, Strengthening, Both, 10 reps, Supine ?Short Arc Quad: Supine, AAROM, Left, 10 reps ?Heel Slides: AAROM, Strengthening, Left, 10 reps ?Hip ABduction/ADduction: AAROM, Strengthening, Left, 10 reps ?Other Exercises ?Other Exercises: Reinforcement on  posterior hip precautions ? ?  ?General Comments General comments (skin integrity, edema, etc.): BP WNL. Pt reporting indigestion ?  ?  ? ?Pertinent Vitals/Pain Pain Assessment ?Pain Assessment: Faces ?Faces Pain Scale: Hurts even more ?Pain Location: L hip with movement ?Pain Descriptors / Indicators: Discomfort, Grimacing, Guarding ?Pain Intervention(s): Limited activity within patient's tolerance, Repositioned  ? ? ?Home Living   ?  ?  ?  ?  ?  ?  ?  ?  ?  ?   ?  ?Prior Function    ?  ?  ?   ? ?PT Goals (current goals can now be found in the care plan section) Acute Rehab PT Goals ?PT Goal Formulation: Patient unable to participate in goal setting ? ?  ?Frequency ? ? ? 7X/week ? ? ? ?  ?PT Plan Current plan remains appropriate  ? ? ?Co-evaluation PT/OT/SLP Co-Evaluation/Treatment: Yes ?Reason for Co-Treatment: Complexity of the patient's impairments (multi-system involvement);For patient/therapist safety;To address functional/ADL transfers ?PT goals addressed during session: Mobility/safety with mobility;Proper use of DME;Strengthening/ROM ?OT goals addressed during session: ADL's and self-care;Proper use of Adaptive equipment and DME ?  ? ?  ?AM-PAC PT "6 Clicks" Mobility   ?Outcome Measure ? Help needed turning from your back to your side while in a flat bed without using bedrails?: A Lot ?Help needed moving from lying on your back to sitting on the side of a flat bed without using bedrails?: A Lot ?Help needed moving to and from a bed to a chair (including a wheelchair)?: A Lot ?Help needed standing up from a chair using your arms (e.g., wheelchair or bedside chair)?: A Lot ?Help needed to walk in hospital room?: Total ?Help needed climbing 3-5 steps with a railing? : Total ?6 Click Score: 10 ? ?  ?End of Session Equipment Utilized During Treatment: Gait belt ?Activity Tolerance: Patient limited by pain ?Patient left: in bed;with call bell/phone within reach;with bed alarm set;with family/visitor  present ?Nurse Communication: Mobility status ?PT Visit Diagnosis: Unsteadiness on feet (R26.81);Muscle weakness (generalized) (M62.81);History of falling (Z91.81);Other abnormalities of gait and mobility (R26.89);Difficulty in walking, not elsewhere classified (R26.2);Pain ?Pain - Right/Left: Left ?Pain - part of body: Hip ?  ? ? ?Time: 4585-9292 ?PT Time Calculation (min) (ACUTE ONLY): 18 min ? ?Charges:  $Therapeutic Exercise: 8-22 mins ?$Therapeutic Activity: 8-22 mins          ?          ? ?[name]. Fairly IV, PT, DPT ?Physical Therapist- [place]  ?[facility]  ?08/14/2021, 2:20 PM ? ?

## 2021-08-14 NOTE — Plan of Care (Signed)
?  Problem: Education: ?Goal: Knowledge of General Education information will improve ?Description: Including pain rating scale, medication(s)/side effects and non-pharmacologic comfort measures ?Outcome: Progressing ?  ?Problem: Health Behavior/Discharge Planning: ?Goal: Ability to manage health-related needs will improve ?Outcome: Progressing ?  ?Problem: Clinical Measurements: ?Goal: Ability to maintain clinical measurements within normal limits will improve ?Outcome: Progressing ?Goal: Will remain free from infection ?Outcome: Progressing ?Goal: Diagnostic test results will improve ?Outcome: Progressing ?Goal: Respiratory complications will improve ?Outcome: Progressing ?Goal: Cardiovascular complication will be avoided ?Outcome: Progressing ?  ?Problem: Activity: ?Goal: Risk for activity intolerance will decrease ?Outcome: Progressing ?  ?Problem: Nutrition: ?Goal: Adequate nutrition will be maintained ?Outcome: Not Progressing ?  ?Problem: Coping: ?Goal: Level of anxiety will decrease ?Outcome: Progressing ?  ?Problem: Elimination: ?Goal: Will not experience complications related to bowel motility ?Outcome: Progressing ?Goal: Will not experience complications related to urinary retention ?Outcome: Not Progressing ?  ?Problem: Pain Managment: ?Goal: General experience of comfort will improve ?Outcome: Progressing ?  ?Problem: Safety: ?Goal: Ability to remain free from injury will improve ?Outcome: Progressing ?  ?Problem: Skin Integrity: ?Goal: Risk for impaired skin integrity will decrease ?Outcome: Progressing ?  ?

## 2021-08-15 DIAGNOSIS — R1310 Dysphagia, unspecified: Secondary | ICD-10-CM

## 2021-08-15 DIAGNOSIS — G309 Alzheimer's disease, unspecified: Secondary | ICD-10-CM

## 2021-08-15 DIAGNOSIS — F0280 Dementia in other diseases classified elsewhere without behavioral disturbance: Secondary | ICD-10-CM

## 2021-08-15 DIAGNOSIS — E871 Hypo-osmolality and hyponatremia: Secondary | ICD-10-CM

## 2021-08-15 LAB — URINALYSIS, ROUTINE W REFLEX MICROSCOPIC
Bilirubin Urine: NEGATIVE
Glucose, UA: NEGATIVE mg/dL
Ketones, ur: NEGATIVE mg/dL
Nitrite: NEGATIVE
Protein, ur: NEGATIVE mg/dL
Specific Gravity, Urine: 1.014 (ref 1.005–1.030)
pH: 6 (ref 5.0–8.0)

## 2021-08-15 LAB — CBC WITH DIFFERENTIAL/PLATELET
Abs Immature Granulocytes: 0.09 10*3/uL — ABNORMAL HIGH (ref 0.00–0.07)
Abs Immature Granulocytes: 0.09 10*3/uL — ABNORMAL HIGH (ref 0.00–0.07)
Basophils Absolute: 0 10*3/uL (ref 0.0–0.1)
Basophils Absolute: 0 10*3/uL (ref 0.0–0.1)
Basophils Relative: 0 %
Basophils Relative: 0 %
Eosinophils Absolute: 0 10*3/uL (ref 0.0–0.5)
Eosinophils Absolute: 0 10*3/uL (ref 0.0–0.5)
Eosinophils Relative: 0 %
Eosinophils Relative: 0 %
HCT: 28.1 % — ABNORMAL LOW (ref 36.0–46.0)
HCT: 26 % — ABNORMAL LOW (ref 36.0–46.0)
Hemoglobin: 9.5 g/dL — ABNORMAL LOW (ref 12.0–15.0)
Hemoglobin: 8.8 g/dL — ABNORMAL LOW (ref 12.0–15.0)
Immature Granulocytes: 1 %
Immature Granulocytes: 1 %
Lymphocytes Relative: 8 %
Lymphocytes Relative: 7 %
Lymphs Abs: 0.8 10*3/uL (ref 0.7–4.0)
Lymphs Abs: 0.8 10*3/uL (ref 0.7–4.0)
MCH: 29.4 pg (ref 26.0–34.0)
MCH: 29.7 pg (ref 26.0–34.0)
MCHC: 33.8 g/dL (ref 30.0–36.0)
MCHC: 33.8 g/dL (ref 30.0–36.0)
MCV: 87 fL (ref 80.0–100.0)
MCV: 87.8 fL (ref 80.0–100.0)
Monocytes Absolute: 1.3 10*3/uL — ABNORMAL HIGH (ref 0.1–1.0)
Monocytes Absolute: 1.5 10*3/uL — ABNORMAL HIGH (ref 0.1–1.0)
Monocytes Relative: 12 %
Monocytes Relative: 14 %
Neutro Abs: 8.4 10*3/uL — ABNORMAL HIGH (ref 1.7–7.7)
Neutro Abs: 8.5 10*3/uL — ABNORMAL HIGH (ref 1.7–7.7)
Neutrophils Relative %: 79 %
Neutrophils Relative %: 78 %
Platelets: 272 10*3/uL (ref 150–400)
Platelets: 262 10*3/uL (ref 150–400)
RBC: 3.23 MIL/uL — ABNORMAL LOW (ref 3.87–5.11)
RBC: 2.96 MIL/uL — ABNORMAL LOW (ref 3.87–5.11)
RDW: 14.6 % (ref 11.5–15.5)
RDW: 14.6 % (ref 11.5–15.5)
WBC: 10.6 10*3/uL — ABNORMAL HIGH (ref 4.0–10.5)
WBC: 10.9 10*3/uL — ABNORMAL HIGH (ref 4.0–10.5)
nRBC: 0 % (ref 0.0–0.2)
nRBC: 0 % (ref 0.0–0.2)

## 2021-08-15 LAB — BASIC METABOLIC PANEL
Anion gap: 4 — ABNORMAL LOW (ref 5–15)
BUN: 15 mg/dL (ref 8–23)
CO2: 26 mmol/L (ref 22–32)
Calcium: 8.2 mg/dL — ABNORMAL LOW (ref 8.9–10.3)
Chloride: 95 mmol/L — ABNORMAL LOW (ref 98–111)
Creatinine, Ser: 0.54 mg/dL (ref 0.44–1.00)
GFR, Estimated: >60 mL/min (ref >60)
Glucose, Bld: 106 mg/dL — ABNORMAL HIGH (ref 70–99)
Potassium: 4.1 mmol/L (ref 3.5–5.1)
Sodium: 125 mmol/L — ABNORMAL LOW (ref 135–145)

## 2021-08-15 LAB — CORTISOL-AM, BLOOD: Cortisol - AM: 23.3 ug/dL — ABNORMAL HIGH (ref 6.7–22.6)

## 2021-08-15 NOTE — Progress Notes (Signed)
?PROGRESS NOTE ? ? ? ?Barbara Vega Kimberley  SFK:[id number] DOB: 10/04/1933 DOA: 08/11/2021 ?PCP: [name], MD  ? ?Brief Narrative:  ?88 yo female who presented to the Er after mechanical fall for which she sustained a hip fracture.  Was scheduled for d/c to SNF 4/1 but daughter was resistant due to dysuria, and bleed thru on surgical dressing. ? ? ?Assessment & Plan: ?  ?Principal Problem: ?  Hip fracture ([place]) ?Active Problems: ?  Osteoporosis, post-menopausal ?  Essential hypertension ?  Alzheimer's dementia without behavioral disturbance ([place]) ?  Stroke [facility]) ? ? ?# Close traumatic left femoral neck fracture ?Mechanical fall. Osteoporotic, hx dysphagia, has had mechanical dilation in 2018. No known cardiac history. S/p left hip hemiarthroplasty 3/29 with ortho ?- pain control, bowel regimen. Had a BM this morning ?- PT/OT consulted, PT advising SNF, TOC at work on that ?- outpt endo f/u for osteoporosis tx, bisphosphonates contraindicated given swallow disorder ?-d/c delayed 2/2 daughters concern for bleeding on surgical pad as well as dysuria ?-checking u/a and hgb ?  ?# Dyspnagia ?This morning choked a bit on banana. No respiratory distress ?- SLP ordered previously ?  ?# Orthostasis ?imrpoving ?  ?# Alzheimer's dementia ?Calm ?- hold aricept given hypotension, holding depakote given hyponatremia ?  ?# Hyponatremia ?Chronic, baseline appears to be upper 120s, low 130s. Was 134 on presentation, 126 today. IV resuscitation likely contributing. Likely baseline siadh ?- hold depoake ?- stop fluids ?- serum osmols low 261, fluids were held as above, expect to normalize, will recheck in am ?  ?# Hx cva ?Cont home statin, aspirin  ? ?DVT prophylaxis: Lovenox SQ  ?Code Status: DNR ? ?  ?Code Status Orders  ?(From admission, onward)  ?  ? ? ?  ? ?  Start     Ordered  ? 08/11/21 1617  Do not attempt resuscitation (DNR)  Continuous       ?Question Answer Comment  ?In the event of cardiac or respiratory ARREST Do not call a  ?code blue?   ?In the event of cardiac or respiratory ARREST Do not perform Intubation, CPR, defibrillation or ACLS   ?In the event of cardiac or respiratory ARREST Use medication by any route, position, wound care, and other measures to relive pain and suffering. May use oxygen, suction and manual treatment of airway obstruction as needed for comfort.   ?  ? 08/11/21 1616  ? ?  ?  ? ?  ? ?Code Status History   ? ? Date Active Date Inactive Code Status Order ID Comments User Context  ? 06/01/2021 1240 06/04/2021 1839 DNR [id number]  Ivor Costa, MD ED  ? 06/01/2021 0833 06/01/2021 1240 Full Code [id number]  Ivor Costa, MD ED  ? 03/26/2021 1948 03/28/2021 1905 Full Code [id number]  Para Skeans, MD ED  ? 10/26/2020 1046 10/28/2020 1718 Full Code [id number]  Collier Bullock, MD ED  ? 06/14/2019 2044 06/15/2019 2034 DNR [id number]  Lenore Cordia, MD ED  ? 06/01/2019 1544 06/02/2019 1700 Full Code [id number]  Hessie Knows, MD Inpatient  ? 06/01/2019 1544 06/01/2019 1544 Full Code [id number]  Hessie Knows, MD Inpatient  ? 05/15/2019 1506 05/15/2019 2144 Full Code [id number]  Hessie Knows, MD Inpatient  ? 02/05/2017 1142 02/06/2017 1540 Full Code [id number]  Henreitta Leber, MD Inpatient  ? 01/15/2017 0851 01/16/2017 1431 Full Code [id number]  Florene Glen, MD Inpatient  ? 11/20/2016 0824 11/21/2016 1643 Full Code [id number]  Loletha Grayer, MD ED  ? ?  ? ?  Family Communication: DAUGHTER AT BEDSIDE  ?Disposition Plan:   TO SNF IN 24-48 HOURS PENDING BED AVAILABILITY ?Consults called: None ?Admission status: Inpatient ? ? ?Consultants:  ?ORTHO ? ?Procedures:  ?DG Chest Portable 1 View ? ?Result Date: 08/11/2021 ?CLINICAL DATA:  Preoperative exam for hip fracture. EXAM: PORTABLE CHEST 1 VIEW COMPARISON:  05/31/2021 FINDINGS: Heart size upper limits of normal. Aortic atherosclerosis. The lungs are clear. No infiltrate, collapse or effusion. Old augmented lower thoracic compression fractures. IMPRESSION: No active disease. Electronically Signed    By: Nelson Chimes M.D.   On: 08/11/2021 14:21  ? ?DG Chest Port 1V same Day ? ?Result Date: 08/11/2021 ?CLINICAL DATA:  Fall and left hip fracture. EXAM: PORTABLE CHEST 1 VIEW COMPARISON:  Chest radiograph dated 07/29/2010. FINDINGS: No focal consolidation, pleural effusion or pneumothorax. The cardiac silhouette is within limits. Atherosclerotic calcification of the aorta. No acute osseous pathology. Osteopenia with degenerative changes of the spine and multilevel vertebroplasty. IMPRESSION: No active disease. Electronically Signed   By: Anner Crete M.D.   On: 08/11/2021 20:56  ? ?DG Hip Port Unilat With Pelvis 1V Left ? ?Result Date: 08/12/2021 ?CLINICAL DATA:  Status post left hip hemiarthroplasty. EXAM: DG HIP (WITH OR WITHOUT PELVIS) 1V PORT LEFT COMPARISON:  August 11, 2021 FINDINGS: A left hip replacement is seen which represents a new finding when compared to the prior study. There is no evidence of surrounding lucency to suggest the presence of hardware loosening. There is no evidence of an acute hip fracture or dislocation. A mild amount of soft tissue air is seen along the lateral aspect of the left hip. IMPRESSION: Status post left hip replacement without evidence of hardware complication. Electronically Signed   By: Virgina Norfolk M.D.   On: 08/12/2021 19:19  ? ?DG Hip Unilat With Pelvis 2-3 Views Left ? ?Result Date: 08/11/2021 ?CLINICAL DATA:  Fall EXAM: DG HIP (WITH OR WITHOUT PELVIS) 2-3V LEFT COMPARISON:  None. FINDINGS: Fracture left femoral neck with angulation and foreshortening. Left hip joint space normal. No other fracture or bone lesion. IMPRESSION: Left femoral neck fracture. Electronically Signed   By: Franchot Gallo M.D.   On: 08/11/2021 14:05   ? ? ? ?Subjective: ?COMPLAINED OF DYSURIA TODAY ?BLEED THRU NOTED ON SURGICAL DRESSING ? ?Objective: ?Vitals:  ? 08/14/21 1930 08/14/21 2347 08/15/21 0416 08/15/21 0905  ?BP: (!) 130/52 (!) 110/52 (!) 129/59 (!) 117/55  ?Pulse: 95 87 85 76   ?Resp: '14 16 16 16  '$ ?Temp: (!) 100.7 ?F (38.2 ?C) 99.7 ?F (37.6 ?C) 98.3 ?F (36.8 ?C) 98.1 ?F (36.7 ?C)  ?TempSrc:   Oral   ?SpO2: 94% 94% 94% 97%  ?Weight:      ?Height:      ? ? ?Intake/Output Summary (Last 24 hours) at 08/15/2021 1440 ?Last data filed at 08/15/2021 1345 ?Gross per 24 hour  ?Intake --  ?Output 750 ml  ?Net -750 ml  ? ?Filed Weights  ? 08/12/21 1537  ?Weight: 59 kg  ? ? ?Examination: ? ?Constitutional: mild distress ?Head: Atraumatic ?Respiratory: Clear to auscultation bilaterally, no wheezing/rales/rhonchi. Normal respiratory effort. No accessory muscle use. Marland Kitchen ?Cardiovascular: Regular rate and rhythm. No murmurs/rubs/gallops. ?Abdomen: Non-tender, non-distended. No masses. No rebound or guarding. Positive bowel sounds. ?Musculoskeletal: external rotation left leg. Dressing in place left lateral hip, BLEED THRU NOTED ?Skin: No rashes, lesions, or ulcers, OTHER THAN SURGICAL SITE.  ?Extremities: trace LE edema, warm ?Neurologic: moving all 4 extremities, painful on surgical le ?Psychiatric: calm  ? ? ? ?  Data Reviewed: I have personally reviewed following labs and imaging studies ? ?CBC: ?Recent Labs  ?Lab 08/11/21 ?1427 08/12/21 ?0448 08/13/21 ?4709 08/14/21 ?6283 08/15/21 ?1230  ?WBC 11.7* 10.7* 13.0* 12.9* 10.6*  ?NEUTROABS 9.6*  --   --   --  8.4*  ?HGB 16.1* 14.6 11.5* 9.3* 9.5*  ?HCT 49.5* 43.8 34.4* 27.5* 28.1*  ?MCV 90.2 87.6 88.4 89.3 87.0  ?PLT 330 317 277 245 272  ? ?Basic Metabolic Panel: ?Recent Labs  ?Lab 08/11/21 ?1427 08/12/21 ?0448 08/13/21 ?6629 08/14/21 ?4765 08/15/21 ?0541  ?NA 134*  --  128* 126* 125*  ?K 4.4  --  3.4* 3.9 4.1  ?CL 101  --  96* 97* 95*  ?CO2 22  --  '25 25 26  '$ ?GLUCOSE 125*  --  138* 115* 106*  ?BUN 15  --  '13 10 15  '$ ?CREATININE 0.73 0.53 0.63 0.50 0.54  ?CALCIUM 9.0  --  8.0* 7.8* 8.2*  ? ?GFR: ?Estimated Creatinine Clearance: 41.3 mL/min (by C-G formula based on SCr of 0.54 mg/dL). ?Liver Function Tests: ?No results for input(s): AST, ALT, ALKPHOS, BILITOT, PROT,  ALBUMIN in the last 168 hours. ?No results for input(s): LIPASE, AMYLASE in the last 168 hours. ?No results for input(s): AMMONIA in the last 168 hours. ?Coagulation Profile: ?Recent Labs  ?Lab 08/11/21 ?1

## 2021-08-15 NOTE — Progress Notes (Signed)
Physical Therapy Treatment ?Patient Details ?Name: Barbara Vega ?MRN: [id number] ?DOB: 08/03/1933 ?Today's Date: 08/15/2021 ? ? ?History of Present Illness Barbara Vega is a 88 y.o. female with medical history significant for alzheimer's dementia, cva, who presents with the above. Ambulates without assistance at baseline. Was out walking in yard with neighbors earlier today when tripped and fell landing on left hip. No other trauma no LOC. Unable to ambulate after. Denies chest pain or cough or fever. Pt is s/p L hip hemiarthroplasty 08/12/21. ? ?  ?PT Comments  ? ? Pt's daughter approached RN station requested assistance to help pt to BR for BM. Pt was c/o severe pain but was willing to attempt OOB to [facility]. A lot of increased time due to pain but was able to achieve EOB short sit with max assist + 2nd person for safety. Pt stood pivot on/off BSC (did take a few very small antalgic steps to EOB from [facility]. Did have successful BM and urination. Pt yells out in pain with almost all movements. RN notified of request for pain medicine (tylenol) post session. She will required SNF at [place] to address deficits while assisting her to PLOF.   ?  ?Recommendations for follow up therapy are one component of a multi-disciplinary discharge planning process, led by the attending physician.  Recommendations may be updated based on patient status, additional functional criteria and insurance authorization. ? ?Follow Up Recommendations ? Skilled nursing-short term rehab (<3 hours/day) ?  ?  ?Assistance Recommended at Discharge Frequent or constant Supervision/Assistance  ?Patient can return home with the following Two people to help with walking and/or transfers;Two people to help with bathing/dressing/bathroom;Help with stairs or ramp for entrance;Assist for transportation;Assistance with cooking/housework ?  ?Equipment Recommendations ? Other (comment) (defer to next level of care)  ?  ?   ?Precautions / Restrictions  Precautions ?Precautions: Posterior Hip ?Precaution Booklet Issued: Yes (comment) ?Restrictions ?Weight Bearing Restrictions: Yes ?LLE Weight Bearing: Weight bearing as tolerated  ?  ? ?Mobility ? Bed Mobility ?Overal bed mobility: Needs Assistance ?Bed Mobility: Supine to Sit, Sit to Supine ?  ?  ?Supine to sit: Max assist, +2 for safety/equipment ?Sit to supine: Max assist, +2 for safety/equipment ?  ?General bed mobility comments: pt required max assist + alot of increased time to transition form supine to EOB short sit. need to get OOB. ?  ? ?Transfers ?Overall transfer level: Needs assistance ?Equipment used: Rolling walker (2 wheels) ?Transfers: Sit to/from Stand, Bed to chair/wheelchair/BSC ?Sit to Stand: Max assist, +2 safety/equipment, From elevated surface ?Stand pivot transfers: Max assist, From elevated surface, +2 safety/equipment ?  ?  ?  ?  ?General transfer comment: pt was able to stand and pivot/step to [facility] woith max assist of one + RN tech in room to assist as needed. Successful BM and urination prior to stand pivot back to bed. pt yells out in pain throughout all/any movements. ?  ? ?Ambulation/Gait ? General Gait Details: unable due to pain/ weakness ? ?  ?Balance Overall balance assessment: Needs assistance ?Sitting-balance support: Feet supported ?Sitting balance-Leahy Scale: Fair ?Sitting balance - Comments: sat EOB withotu LOB with BLE supporton floor ?  ?Standing balance support: Bilateral upper extremity supported, During functional activity, Reliant on assistive device for balance ?Standing balance-Leahy Scale: Poor ?  ? ?  ?Cognition Arousal/Alertness: Awake/alert ?Behavior During Therapy: Flat affect ?Overall Cognitive Status: History of cognitive impairments - at baseline ?Area of Impairment: Orientation, Attention, Safety/judgement, Awareness, Following commands ?   ?  Orientation Level: Disoriented to, Time, Situation ?Current Attention Level: Focused ?  ?Following Commands: Follows one  step commands with increased time ?Safety/Judgement: Decreased awareness of safety, Decreased awareness of deficits ?Awareness: Intellectual ?  ?General Comments: baseline dementia ?  ?  ? ?  ?   ?General Comments General comments (skin integrity, edema, etc.): pt is in too much pain to perform ther ex after transfer on/off BSC ?  ?  ? ?Pertinent Vitals/Pain Pain Assessment ?Pain Assessment: 0-10 ?Pain Score: 4  ?Faces Pain Scale: Hurts even more ?Pain Location: L hip with movement ?Pain Descriptors / Indicators: Discomfort, Grimacing, Guarding ?Pain Intervention(s): Limited activity within patient's tolerance, Monitored during session, Repositioned  ? ? ? ?PT Goals (current goals can now be found in the care plan section) Acute Rehab PT Goals ?Patient Stated Goal: none stated ?Progress towards PT goals: Progressing toward goals ? ?  ?Frequency ? ? ? 7X/week ? ? ? ?  ?PT Plan Current plan remains appropriate  ? ? ?Co-evaluation   ?  ?PT goals addressed during session: Mobility/safety with mobility;Balance;Proper use of DME;Strengthening/ROM ?  ?  ? ?  ?AM-PAC PT "6 Clicks" Mobility   ?Outcome Measure ? Help needed turning from your back to your side while in a flat bed without using bedrails?: A Lot ?Help needed moving from lying on your back to sitting on the side of a flat bed without using bedrails?: A Lot ?Help needed moving to and from a bed to a chair (including a wheelchair)?: A Lot ?Help needed standing up from a chair using your arms (e.g., wheelchair or bedside chair)?: A Lot ?Help needed to walk in hospital room?: Total ?Help needed climbing 3-5 steps with a railing? : Total ?6 Click Score: 10 ? ?  ?End of Session Equipment Utilized During Treatment: Gait belt ?Activity Tolerance: Patient tolerated treatment well ?Patient left: in bed;with call bell/phone within reach;with bed alarm set;with family/visitor present ?Nurse Communication: Mobility status ?PT Visit Diagnosis: Unsteadiness on feet  (R26.81);Muscle weakness (generalized) (M62.81);History of falling (Z91.81);Other abnormalities of gait and mobility (R26.89);Difficulty in walking, not elsewhere classified (R26.2);Pain ?Pain - Right/Left: Left ?Pain - part of body: Hip ?  ? ? ?Time: 3354-5625 ?PT Time Calculation (min) (ACUTE ONLY): 18 min ? ?Charges:  $Therapeutic Activity: 8-22 mins          ?          ?Julaine Fusi PTA ?08/15/21, 1:41 PM  ? ?

## 2021-08-15 NOTE — Plan of Care (Signed)

## 2021-08-16 LAB — BASIC METABOLIC PANEL
Anion gap: 3 — ABNORMAL LOW (ref 5–15)
BUN: 12 mg/dL (ref 8–23)
CO2: 28 mmol/L (ref 22–32)
Calcium: 8.3 mg/dL — ABNORMAL LOW (ref 8.9–10.3)
Chloride: 97 mmol/L — ABNORMAL LOW (ref 98–111)
Creatinine, Ser: 0.59 mg/dL (ref 0.44–1.00)
GFR, Estimated: >60 mL/min (ref >60)
Glucose, Bld: 102 mg/dL — ABNORMAL HIGH (ref 70–99)
Potassium: 4.2 mmol/L (ref 3.5–5.1)
Sodium: 128 mmol/L — ABNORMAL LOW (ref 135–145)

## 2021-08-16 LAB — CBC WITH DIFFERENTIAL/PLATELET
Abs Immature Granulocytes: 0.07 10*3/uL (ref 0.00–0.07)
Basophils Absolute: 0.1 10*3/uL (ref 0.0–0.1)
Basophils Relative: 1 %
Eosinophils Absolute: 0.1 10*3/uL (ref 0.0–0.5)
Eosinophils Relative: 1 %
HCT: 24.3 % — ABNORMAL LOW (ref 36.0–46.0)
Hemoglobin: 8.2 g/dL — ABNORMAL LOW (ref 12.0–15.0)
Immature Granulocytes: 1 %
Lymphocytes Relative: 17 %
Lymphs Abs: 1.4 10*3/uL (ref 0.7–4.0)
MCH: 29.8 pg (ref 26.0–34.0)
MCHC: 33.7 g/dL (ref 30.0–36.0)
MCV: 88.4 fL (ref 80.0–100.0)
Monocytes Absolute: 1 10*3/uL (ref 0.1–1.0)
Monocytes Relative: 12 %
Neutro Abs: 5.4 10*3/uL (ref 1.7–7.7)
Neutrophils Relative %: 68 %
Platelets: 295 10*3/uL (ref 150–400)
RBC: 2.75 MIL/uL — ABNORMAL LOW (ref 3.87–5.11)
RDW: 14.7 % (ref 11.5–15.5)
WBC: 7.9 10*3/uL (ref 4.0–10.5)
nRBC: 0 % (ref 0.0–0.2)

## 2021-08-16 MED ORDER — MELATONIN 5 MG PO TABS
5.0000 mg | ORAL_TABLET | Freq: Every evening | ORAL | Status: DC | PRN
  Administered 2021-08-16: 5 mg via ORAL
  Filled 2021-08-16: qty 1

## 2021-08-16 MED ORDER — CALCIUM CARBONATE ANTACID 500 MG PO CHEW
200.0000 mg | CHEWABLE_TABLET | Freq: Once | ORAL | Status: AC
  Administered 2021-08-16: 200 mg via ORAL
  Filled 2021-08-16: qty 1

## 2021-08-16 MED ORDER — ASPIRIN EC 81 MG PO TBEC
81.0000 mg | DELAYED_RELEASE_TABLET | Freq: Two times a day (BID) | ORAL | Status: DC
  Administered 2021-08-16 – 2021-08-18 (×5): 81 mg via ORAL
  Filled 2021-08-16 (×5): qty 1

## 2021-08-16 NOTE — Progress Notes (Signed)
?   08/13/21 1100  ?Assess: MEWS Score  ?BP (!) 80/51  ?ECG Heart Rate (!) 59  ?Resp 16  ?Level of Consciousness Alert  ?SpO2 98 %  ?O2 Device Room Air  ?Patient Activity (if Appropriate) In bed  ?O2 Flow Rate (L/min) 2 L/min  ?Assess: MEWS Score  ?MEWS Temp 0  ?MEWS Systolic 2  ?MEWS Pulse 0  ?MEWS RR 0  ?MEWS LOC 0  ?MEWS Score 2  ?MEWS Score Color Yellow  ?Assess: if the MEWS score is Yellow or Red  ?Were vital signs taken at a resting state? Yes  ?Focused Assessment No change from prior assessment  ?Does the patient meet 2 or more of the SIRS criteria? No  ?MEWS guidelines implemented *See Row Information* Yes  ?Treat  ?MEWS Interventions Administered scheduled meds/treatments;Administered prn meds/treatments;Other (Comment) ?(MD notified)  ?Take Vital Signs  ?Increase Vital Sign Frequency  Yellow: Q 2hr X 2 then Q 4hr X 2, if remains yellow, continue Q 4hrs  ?Escalate  ?MEWS: Escalate Yellow: discuss with charge nurse/RN and consider discussing with provider and RRT  ?Notify: Charge Nurse/RN  ?Name of Charge Nurse/RN Notified Kathrin Greathouse  ?Date Charge Nurse/RN Notified 08/13/21  ?Time Charge Nurse/RN Notified 1100  ?Notify: Provider  ?Provider Name/Title Dr Si Raider  ?Date Provider Notified 08/13/21  ?Time Provider Notified 1000  ?Notification Type Face-to-face  ?Notification Reason Change in status  ?Provider response At bedside;See new orders  ?Date of Provider Response 08/13/21  ?Time of Provider Response 1015  ?Document  ?Patient Outcome Other (Comment) ?(monitor)  ?Assess: SIRS CRITERIA  ?SIRS Temperature  0  ?SIRS Pulse 0  ?SIRS Respirations  0  ?SIRS WBC 1  ?SIRS Score Sum  1  ? ? ?

## 2021-08-16 NOTE — Progress Notes (Signed)
Physical Therapy Treatment ?Patient Details ?Name: Barbara Vega ?MRN: [id number] ?DOB: February 02, 1934 ?Today's Date: 08/16/2021 ? ? ?History of Present Illness Barbara Vega is a 88 y.o. female with medical history significant for alzheimer's dementia, cva, who presents with the above. Ambulates without assistance at baseline. Was out walking in yard with neighbors earlier today when tripped and fell landing on left hip. No other trauma no LOC. Unable to ambulate after. Denies chest pain or cough or fever. Pt is s/p L hip hemiarthroplasty 08/12/21. ? ?  ?PT Comments  ? ? Participated in exercises as described below.  Pt declined OOB.  RN in to give pain meds and breakfast arrived during session.  Pt positioned to eat and assisted with tray set up.  Declined being hungry and wanting food but encouraged to try to eat a little. ?  ?Recommendations for follow up therapy are one component of a multi-disciplinary discharge planning process, led by the attending physician.  Recommendations may be updated based on patient status, additional functional criteria and insurance authorization. ? ?Follow Up Recommendations ? Skilled nursing-short term rehab (<3 hours/day) ?  ?  ?Assistance Recommended at Discharge    ?Patient can return home with the following Two people to help with walking and/or transfers;Two people to help with bathing/dressing/bathroom;Help with stairs or ramp for entrance;Assist for transportation;Assistance with cooking/housework ?  ?Equipment Recommendations ?    ?  ?Recommendations for Other Services   ? ? ?  ?Precautions / Restrictions Precautions ?Precautions: Posterior Hip ?Precaution Booklet Issued: Yes (comment) ?Restrictions ?Weight Bearing Restrictions: Yes ?LLE Weight Bearing: Weight bearing as tolerated  ?  ? ?Mobility ? Bed Mobility ?  ?  ?  ?  ?  ?  ?  ?  ?  ? ?Transfers ?  ?  ?  ?  ?  ?  ?  ?  ?  ?  ?  ? ?Ambulation/Gait ?  ?  ?  ?  ?  ?  ?  ?  ? ? ?Stairs ?  ?  ?  ?  ?  ? ? ?Wheelchair Mobility ?   ? ?Modified Rankin (Stroke Patients Only) ?  ? ? ?  ?Balance   ?  ?  ?  ?  ?  ?  ?  ?  ?  ?  ?  ?  ?  ?  ?  ?  ?  ?  ?  ? ?  ?Cognition Arousal/Alertness: Awake/alert ?Behavior During Therapy: Flat affect ?Overall Cognitive Status: History of cognitive impairments - at baseline ?  ?  ?  ?  ?  ?  ?  ?  ?  ?  ?  ?  ?  ?  ?  ?  ?  ?  ?  ? ?  ?Exercises Other Exercises ?Other Exercises: supine BLE AAROM RLE, PROM LLE  x 10 ? ?  ?General Comments   ?  ?  ? ?Pertinent Vitals/Pain Pain Assessment ?Pain Assessment: 0-10 ?Pain Score: 7  ?Pain Descriptors / Indicators: Discomfort, Grimacing, Guarding ?Pain Intervention(s): Patient requesting pain meds-RN notified, Limited activity within patient's tolerance, Repositioned  ? ? ?Home Living   ?  ?  ?  ?  ?  ?  ?  ?  ?  ?   ?  ?Prior Function    ?  ?  ?   ? ?PT Goals (current goals can now be found in the care plan section) Progress towards PT goals: Progressing toward goals ? ?  ?  Frequency ? ? ? 7X/week ? ? ? ?  ?PT Plan Current plan remains appropriate  ? ? ?Co-evaluation   ?  ?  ?  ?  ? ?  ?AM-PAC PT "6 Clicks" Mobility   ?Outcome Measure ? Help needed turning from your back to your side while in a flat bed without using bedrails?: A Lot ?Help needed moving from lying on your back to sitting on the side of a flat bed without using bedrails?: A Lot ?Help needed moving to and from a bed to a chair (including a wheelchair)?: A Lot ?Help needed standing up from a chair using your arms (e.g., wheelchair or bedside chair)?: A Lot ?Help needed to walk in hospital room?: Total ?Help needed climbing 3-5 steps with a railing? : Total ?6 Click Score: 10 ? ?  ?End of Session   ?Activity Tolerance: Patient tolerated treatment well;Patient limited by pain ?Patient left: in bed;with call bell/phone within reach;with bed alarm set;with nursing/sitter in room ?Nurse Communication: Mobility status ?PT Visit Diagnosis: Unsteadiness on feet (R26.81);Muscle weakness (generalized)  (M62.81);History of falling (Z91.81);Other abnormalities of gait and mobility (R26.89);Difficulty in walking, not elsewhere classified (R26.2);Pain ?Pain - Right/Left: Left ?Pain - part of body: Hip ?  ? ? ?Time: 0912-0922 ?PT Time Calculation (min) (ACUTE ONLY): 10 min ? ?Charges:  $Therapeutic Exercise: 8-22 mins          ?          ? ?Chesley Noon, PTA ?08/16/21, 9:27 AM ? ?

## 2021-08-16 NOTE — Progress Notes (Signed)
?PROGRESS NOTE ? ? ? ?Barbara Vega  ENI:[id number] DOB: Jul 31, 1933 DOA: 08/11/2021 ?PCP: [name], MD  ? ?Brief Narrative:  ?88 yo female who presented to the ER after mechanical fall for which she sustained a hip fracture.  Was scheduled for d/c to SNF 4/1 but daughter was resistant due to dysuria, and bleed thru on surgical dressing.Unfortunately has soaked thru multiple dressings.  Ortho saw again today, lovenox d/c'd and dvt ppx with asa ? ? ?Assessment & Plan: ?  ?Principal Problem: ?  Hip fracture ([place]) ?Active Problems: ?  Osteoporosis, post-menopausal ?  Essential hypertension ?  Alzheimer's dementia without behavioral disturbance ([place]) ?  Stroke [facility]) ? ? ?# Close traumatic left femoral neck fracture ?Mechanical fall. Osteoporotic, hx dysphagia, has had mechanical dilation in 2018. No known cardiac history. S/p left hip hemiarthroplasty 3/29 with ortho ?- pain control, bowel regimen. Had a BM this morning ?- PT/OT consulted, PT advising SNF, TOC at work on that ?- outpt endo f/u for osteoporosis tx, bisphosphonates contraindicated given swallow disorder ?-d/c delayed 2/2  bleeding THRU MULTIPLE surgical pads ?-lovenox d/cd ?-monitor overnight, if stable d/c to SNF tomorrow ?-cbc in AM ?-HGB 9.5>8.8>8.2 ?  ?# Dyspnagia ?This morning choked a bit on banana. No respiratory distress ?- SLP ordered previously ?  ?# Orthostasis ?imrpoving ?  ?# Alzheimer's dementia ?Calm ?- hold aricept given hypotension, holding depakote given hyponatremia ?  ?# Hyponatremia ?Chronic, baseline appears to be upper 120s, low 130s. Was 134 on presentation, 128 today. IV resuscitation likely contributing. Likely baseline siadh ?- holding depoake for now ?- stop fluids ?- serum osmols low 261, fluids were held as above, expect to normalize, will recheck in am ?  ?# Hx cva ?Cont home statin, aspirin  ?  ?DVT prophylaxis: Lovenox SQ d/c, will add scd to unaffected le due to surgical site bleed thru ?Code Status: DNR ? ?  ?Code  Status Orders  ?(From admission, onward)  ?  ? ? ?  ? ?  Start     Ordered  ? 08/11/21 1617  Do not attempt resuscitation (DNR)  Continuous       ?Question Answer Comment  ?In the event of cardiac or respiratory ARREST Do not call a ?code blue?   ?In the event of cardiac or respiratory ARREST Do not perform Intubation, CPR, defibrillation or ACLS   ?In the event of cardiac or respiratory ARREST Use medication by any route, position, wound care, and other measures to relive pain and suffering. May use oxygen, suction and manual treatment of airway obstruction as needed for comfort.   ?  ? 08/11/21 1616  ? ?  ?  ? ?  ? ?Code Status History   ? ? Date Active Date Inactive Code Status Order ID Comments User Context  ? 06/01/2021 1240 06/04/2021 1839 DNR [id number]  Ivor Costa, MD ED  ? 06/01/2021 0833 06/01/2021 1240 Full Code [id number]  Ivor Costa, MD ED  ? 03/26/2021 1948 03/28/2021 1905 Full Code [id number]  Para Skeans, MD ED  ? 10/26/2020 1046 10/28/2020 1718 Full Code [id number]  Collier Bullock, MD ED  ? 06/14/2019 2044 06/15/2019 2034 DNR [id number]  Lenore Cordia, MD ED  ? 06/01/2019 1544 06/02/2019 1700 Full Code [id number]  Hessie Knows, MD Inpatient  ? 06/01/2019 1544 06/01/2019 1544 Full Code [id number]  Hessie Knows, MD Inpatient  ? 05/15/2019 1506 05/15/2019 2144 Full Code [id number]  Hessie Knows, MD Inpatient  ? 02/05/2017 1142 02/06/2017 1540 Full Code  [id number]  Henreitta Leber, MD Inpatient  ? 01/15/2017 0851 01/16/2017 1431 Full Code [id number]  Florene Glen, MD Inpatient  ? 11/20/2016 0824 11/21/2016 1643 Full Code [id number]  Loletha Grayer, MD ED  ? ?  ? ?Family Communication: daughter at bedside  ?Disposition Plan:   TO SNF IF BLEEDING STABILIZES-HOPEFULLY 24 HOURS ?Consults called:  ORTHO DUE TO BLEEDING ?Admission status: Inpatient ? ? ?Consultants:  ?AS ABOVE ? ?Procedures:  ?DG Chest Portable 1 View ? ?Result Date: 08/11/2021 ?CLINICAL DATA:  Preoperative exam for hip fracture. EXAM: PORTABLE CHEST 1 VIEW  COMPARISON:  05/31/2021 FINDINGS: Heart size upper limits of normal. Aortic atherosclerosis. The lungs are clear. No infiltrate, collapse or effusion. Old augmented lower thoracic compression fractures. IMPRESSION: No active disease. Electronically Signed   By: Nelson Chimes M.D.   On: 08/11/2021 14:21  ? ?DG Chest Port 1V same Day ? ?Result Date: 08/11/2021 ?CLINICAL DATA:  Fall and left hip fracture. EXAM: PORTABLE CHEST 1 VIEW COMPARISON:  Chest radiograph dated 07/29/2010. FINDINGS: No focal consolidation, pleural effusion or pneumothorax. The cardiac silhouette is within limits. Atherosclerotic calcification of the aorta. No acute osseous pathology. Osteopenia with degenerative changes of the spine and multilevel vertebroplasty. IMPRESSION: No active disease. Electronically Signed   By: Anner Crete M.D.   On: 08/11/2021 20:56  ? ?DG Hip Port Unilat With Pelvis 1V Left ? ?Result Date: 08/12/2021 ?CLINICAL DATA:  Status post left hip hemiarthroplasty. EXAM: DG HIP (WITH OR WITHOUT PELVIS) 1V PORT LEFT COMPARISON:  August 11, 2021 FINDINGS: A left hip replacement is seen which represents a new finding when compared to the prior study. There is no evidence of surrounding lucency to suggest the presence of hardware loosening. There is no evidence of an acute hip fracture or dislocation. A mild amount of soft tissue air is seen along the lateral aspect of the left hip. IMPRESSION: Status post left hip replacement without evidence of hardware complication. Electronically Signed   By: Virgina Norfolk M.D.   On: 08/12/2021 19:19  ? ?DG Hip Unilat With Pelvis 2-3 Views Left ? ?Result Date: 08/11/2021 ?CLINICAL DATA:  Fall EXAM: DG HIP (WITH OR WITHOUT PELVIS) 2-3V LEFT COMPARISON:  None. FINDINGS: Fracture left femoral neck with angulation and foreshortening. Left hip joint space normal. No other fracture or bone lesion. IMPRESSION: Left femoral neck fracture. Electronically Signed   By: Franchot Gallo M.D.   On:  08/11/2021 14:05   ? ? ? ? ?Subjective: ?SURGICAL SITE BLEED THRU ON MULTIPLE BANDAGES ? ?Objective: ?Vitals:  ? 08/15/21 2357 08/16/21 0335 08/16/21 0551 08/16/21 0750  ?BP: 118/60 (!) 93/44 (!) 119/45 (!) 101/57  ?Pulse: 80 64 62 68  ?Resp: '17 17  18  '$ ?Temp: 98.5 ?F (36.9 ?C) 98.7 ?F (37.1 ?C)  98.6 ?F (37 ?C)  ?TempSrc:      ?SpO2: 97% 92%  96%  ?Weight:      ?Height:      ? ? ?Intake/Output Summary (Last 24 hours) at 08/16/2021 1521 ?Last data filed at 08/16/2021 1300 ?Gross per 24 hour  ?Intake --  ?Output 1950 ml  ?Net -1950 ml  ? ?Filed Weights  ? 08/12/21 1537  ?Weight: 59 kg  ? ? ?Examination: ? ?General exam: Appears calm and comfortable  ?Respiratory system: Clear to auscultation. Respiratory effort normal. ?Cardiovascular system: S1 & S2 heard, RRR. No JVD, murmurs, rubs, gallops or clicks. No pedal edema. ?Gastrointestinal system: Abdomen is nondistended, soft and nontender. No organomegaly or masses felt.  Normal bowel sounds heard. ?Central nervous system: Alert and oriented. No focal neurological deficits. ?Extremities: Symmetric 5 x 5 power. ?Skin: BLOOD POOLED ON BANDAGE, WOUND NOT ACTIVE WITH BRISK BLEED, NO DEHIS ?Psychiatry: Judgement and insight appear normal. Mood & affect appropriate.  ? ? ? ?Data Reviewed: I have personally reviewed following labs and imaging studies ? ?CBC: ?Recent Labs  ?Lab 08/11/21 ?1427 08/12/21 ?0448 08/13/21 ?6734 08/14/21 ?1937 08/15/21 ?1230 08/15/21 ?1526 08/16/21 ?0527  ?WBC 11.7*   < > 13.0* 12.9* 10.6* 10.9* 7.9  ?NEUTROABS 9.6*  --   --   --  8.4* 8.5* 5.4  ?HGB 16.1*   < > 11.5* 9.3* 9.5* 8.8* 8.2*  ?HCT 49.5*   < > 34.4* 27.5* 28.1* 26.0* 24.3*  ?MCV 90.2   < > 88.4 89.3 87.0 87.8 88.4  ?PLT 330   < > 277 245 272 262 295  ? < > = values in this interval not displayed.  ? ?Basic Metabolic Panel: ?Recent Labs  ?Lab 08/11/21 ?1427 08/12/21 ?0448 08/13/21 ?9024 08/14/21 ?0973 08/15/21 ?5329 08/16/21 ?0527  ?NA 134*  --  128* 126* 125* 128*  ?K 4.4  --  3.4* 3.9 4.1  4.2  ?CL 101  --  96* 97* 95* 97*  ?CO2 22  --  '25 25 26 28  '$ ?GLUCOSE 125*  --  138* 115* 106* 102*  ?BUN 15  --  '13 10 15 12  '$ ?CREATININE 0.73 0.53 0.63 0.50 0.54 0.59  ?CALCIUM 9.0  --  8.0* 7.8* 8.2* 8.3*  ?

## 2021-08-16 NOTE — Progress Notes (Signed)
?Subjective: ? ?Patient reports pain as mild.  Resting in bed. Yolanda Bonine is in the room. ? ?Objective:  ? ?VITALS:   ?Vitals:  ? 08/15/21 2357 08/16/21 0335 08/16/21 0551 08/16/21 0750  ?BP: 118/60 (!) 93/44 (!) 119/45 (!) 101/57  ?Pulse: 80 64 62 68  ?Resp: '17 17  18  '$ ?Temp: 98.5 ?F (36.9 ?C) 98.7 ?F (37.1 ?C)  98.6 ?F (37 ?C)  ?TempSrc:      ?SpO2: 97% 92%  96%  ?Weight:      ?Height:      ? ? ?PHYSICAL EXAM: ? ?ABD soft ?Sensation intact distally ?Dorsiflexion/Plantar flexion intact ?Incision: scant drainage ?No cellulitis present ?Compartment soft ? ?LABS ? ?Results for orders placed or performed during the hospital encounter of 08/11/21 (from the past 24 hour(s))  ?Urinalysis, Routine w reflex microscopic     Status: Abnormal  ? Collection Time: 08/15/21 12:18 PM  ?Result Value Ref Range  ? Color, Urine YELLOW (A) YELLOW  ? APPearance CLEAR (A) CLEAR  ? Specific Gravity, Urine 1.014 1.005 - 1.030  ? pH 6.0 5.0 - 8.0  ? Glucose, UA NEGATIVE NEGATIVE mg/dL  ? Hgb urine dipstick MODERATE (A) NEGATIVE  ? Bilirubin Urine NEGATIVE NEGATIVE  ? Ketones, ur NEGATIVE NEGATIVE mg/dL  ? Protein, ur NEGATIVE NEGATIVE mg/dL  ? Nitrite NEGATIVE NEGATIVE  ? Leukocytes,Ua TRACE (A) NEGATIVE  ? RBC / HPF 0-5 0 - 5 RBC/hpf  ? WBC, UA 0-5 0 - 5 WBC/hpf  ? Bacteria, UA RARE (A) NONE SEEN  ? Squamous Epithelial / LPF 0-5 0 - 5  ?CBC with Differential/Platelet     Status: Abnormal  ? Collection Time: 08/15/21 12:30 PM  ?Result Value Ref Range  ? WBC 10.6 (H) 4.0 - 10.5 K/uL  ? RBC 3.23 (L) 3.87 - 5.11 MIL/uL  ? Hemoglobin 9.5 (L) 12.0 - 15.0 g/dL  ? HCT 28.1 (L) 36.0 - 46.0 %  ? MCV 87.0 80.0 - 100.0 fL  ? MCH 29.4 26.0 - 34.0 pg  ? MCHC 33.8 30.0 - 36.0 g/dL  ? RDW 14.6 11.5 - 15.5 %  ? Platelets 272 150 - 400 K/uL  ? nRBC 0.0 0.0 - 0.2 %  ? Neutrophils Relative % 79 %  ? Neutro Abs 8.4 (H) 1.7 - 7.7 K/uL  ? Lymphocytes Relative 8 %  ? Lymphs Abs 0.8 0.7 - 4.0 K/uL  ? Monocytes Relative 12 %  ? Monocytes Absolute 1.3 (H) 0.1 - 1.0  K/uL  ? Eosinophils Relative 0 %  ? Eosinophils Absolute 0.0 0.0 - 0.5 K/uL  ? Basophils Relative 0 %  ? Basophils Absolute 0.0 0.0 - 0.1 K/uL  ? Immature Granulocytes 1 %  ? Abs Immature Granulocytes 0.09 (H) 0.00 - 0.07 K/uL  ?CBC with Differential/Platelet     Status: Abnormal  ? Collection Time: 08/15/21  3:26 PM  ?Result Value Ref Range  ? WBC 10.9 (H) 4.0 - 10.5 K/uL  ? RBC 2.96 (L) 3.87 - 5.11 MIL/uL  ? Hemoglobin 8.8 (L) 12.0 - 15.0 g/dL  ? HCT 26.0 (L) 36.0 - 46.0 %  ? MCV 87.8 80.0 - 100.0 fL  ? MCH 29.7 26.0 - 34.0 pg  ? MCHC 33.8 30.0 - 36.0 g/dL  ? RDW 14.6 11.5 - 15.5 %  ? Platelets 262 150 - 400 K/uL  ? nRBC 0.0 0.0 - 0.2 %  ? Neutrophils Relative % 78 %  ? Neutro Abs 8.5 (H) 1.7 - 7.7 K/uL  ?  Lymphocytes Relative 7 %  ? Lymphs Abs 0.8 0.7 - 4.0 K/uL  ? Monocytes Relative 14 %  ? Monocytes Absolute 1.5 (H) 0.1 - 1.0 K/uL  ? Eosinophils Relative 0 %  ? Eosinophils Absolute 0.0 0.0 - 0.5 K/uL  ? Basophils Relative 0 %  ? Basophils Absolute 0.0 0.0 - 0.1 K/uL  ? Immature Granulocytes 1 %  ? Abs Immature Granulocytes 0.09 (H) 0.00 - 0.07 K/uL  ?Basic metabolic panel     Status: Abnormal  ? Collection Time: 08/16/21  5:27 AM  ?Result Value Ref Range  ? Sodium 128 (L) 135 - 145 mmol/L  ? Potassium 4.2 3.5 - 5.1 mmol/L  ? Chloride 97 (L) 98 - 111 mmol/L  ? CO2 28 22 - 32 mmol/L  ? Glucose, Bld 102 (H) 70 - 99 mg/dL  ? BUN 12 8 - 23 mg/dL  ? Creatinine, Ser 0.59 0.44 - 1.00 mg/dL  ? Calcium 8.3 (L) 8.9 - 10.3 mg/dL  ? GFR, Estimated >60 >60 mL/min  ? Anion gap 3 (L) 5 - 15  ?CBC with Differential/Platelet     Status: Abnormal  ? Collection Time: 08/16/21  5:27 AM  ?Result Value Ref Range  ? WBC 7.9 4.0 - 10.5 K/uL  ? RBC 2.75 (L) 3.87 - 5.11 MIL/uL  ? Hemoglobin 8.2 (L) 12.0 - 15.0 g/dL  ? HCT 24.3 (L) 36.0 - 46.0 %  ? MCV 88.4 80.0 - 100.0 fL  ? MCH 29.8 26.0 - 34.0 pg  ? MCHC 33.7 30.0 - 36.0 g/dL  ? RDW 14.7 11.5 - 15.5 %  ? Platelets 295 150 - 400 K/uL  ? nRBC 0.0 0.0 - 0.2 %  ? Neutrophils Relative % 68  %  ? Neutro Abs 5.4 1.7 - 7.7 K/uL  ? Lymphocytes Relative 17 %  ? Lymphs Abs 1.4 0.7 - 4.0 K/uL  ? Monocytes Relative 12 %  ? Monocytes Absolute 1.0 0.1 - 1.0 K/uL  ? Eosinophils Relative 1 %  ? Eosinophils Absolute 0.1 0.0 - 0.5 K/uL  ? Basophils Relative 1 %  ? Basophils Absolute 0.1 0.0 - 0.1 K/uL  ? Immature Granulocytes 1 %  ? Abs Immature Granulocytes 0.07 0.00 - 0.07 K/uL  ? ? ?No results found. ? ?Assessment/Plan: ?4 Days Post-Op  ? ?Principal Problem: ?  Hip fracture ([place]) ?Active Problems: ?  Osteoporosis, post-menopausal ?  Essential hypertension ?  Alzheimer's dementia without behavioral disturbance ([place]) ?  Stroke [facility]) ? ? ?Up with therapy ?Discharge to SNF ? ?Patient doing well from orthopedic standpoint on postop day 3.  Continue physical therapy as she can tolerate.  Patient may weight-bear as tolerated in the left lower extremity but is on posterior hip precautions.  Continue abduction pillow while in bed.  Patient will need a skilled nursing facility upon discharge.  ? ?Dressing is changed. No active bleeding. Given the recent drainage from her wound, recommend stopping the Lovenox and continue the aspirin for DVT prophylaxis. ? ?Follow-up with Dr. Mack Guise in 2 weeks after discharge. ? ?Please call with questions. ? ? ?Lovell Sheehan , MD ?08/16/2021, 12:03 PM ? ? ? ? ? ? ?

## 2021-08-16 NOTE — Plan of Care (Signed)

## 2021-08-17 LAB — BASIC METABOLIC PANEL
Anion gap: 5 (ref 5–15)
BUN: 13 mg/dL (ref 8–23)
CO2: 30 mmol/L (ref 22–32)
Calcium: 8.6 mg/dL — ABNORMAL LOW (ref 8.9–10.3)
Chloride: 95 mmol/L — ABNORMAL LOW (ref 98–111)
Creatinine, Ser: 0.5 mg/dL (ref 0.44–1.00)
GFR, Estimated: >60 mL/min (ref >60)
Glucose, Bld: 114 mg/dL — ABNORMAL HIGH (ref 70–99)
Potassium: 3.7 mmol/L (ref 3.5–5.1)
Sodium: 130 mmol/L — ABNORMAL LOW (ref 135–145)

## 2021-08-17 LAB — CBC WITH DIFFERENTIAL/PLATELET
Abs Immature Granulocytes: 0.14 10*3/uL — ABNORMAL HIGH (ref 0.00–0.07)
Basophils Absolute: 0.1 10*3/uL (ref 0.0–0.1)
Basophils Relative: 1 %
Eosinophils Absolute: 0.2 10*3/uL (ref 0.0–0.5)
Eosinophils Relative: 2 %
HCT: 28.4 % — ABNORMAL LOW (ref 36.0–46.0)
Hemoglobin: 9.5 g/dL — ABNORMAL LOW (ref 12.0–15.0)
Immature Granulocytes: 2 %
Lymphocytes Relative: 15 %
Lymphs Abs: 1.2 10*3/uL (ref 0.7–4.0)
MCH: 29.5 pg (ref 26.0–34.0)
MCHC: 33.5 g/dL (ref 30.0–36.0)
MCV: 88.2 fL (ref 80.0–100.0)
Monocytes Absolute: 1.1 10*3/uL — ABNORMAL HIGH (ref 0.1–1.0)
Monocytes Relative: 14 %
Neutro Abs: 5.2 10*3/uL (ref 1.7–7.7)
Neutrophils Relative %: 66 %
Platelets: 378 10*3/uL (ref 150–400)
RBC: 3.22 MIL/uL — ABNORMAL LOW (ref 3.87–5.11)
RDW: 14.7 % (ref 11.5–15.5)
WBC: 7.9 10*3/uL (ref 4.0–10.5)
nRBC: 0.3 % — ABNORMAL HIGH (ref 0.0–0.2)

## 2021-08-17 LAB — OSMOLALITY: Osmolality: 269 mOsm/kg — ABNORMAL LOW (ref 275–295)

## 2021-08-17 MED ORDER — ALUM & MAG HYDROXIDE-SIMETH 200-200-20 MG/5ML PO SUSP
30.0000 mL | Freq: Once | ORAL | Status: AC
  Administered 2021-08-17: 30 mL via ORAL
  Filled 2021-08-17: qty 30

## 2021-08-17 MED ORDER — LIDOCAINE VISCOUS HCL 2 % MT SOLN
15.0000 mL | Freq: Once | OROMUCOSAL | Status: AC
Start: 2021-08-17 — End: 2021-08-17
  Administered 2021-08-17: 15 mL via ORAL
  Filled 2021-08-17: qty 15

## 2021-08-17 MED ORDER — DIVALPROEX SODIUM 250 MG PO DR TAB
250.0000 mg | DELAYED_RELEASE_TABLET | Freq: Two times a day (BID) | ORAL | Status: DC
  Administered 2021-08-17 – 2021-08-18 (×2): 250 mg via ORAL
  Filled 2021-08-17 (×3): qty 1

## 2021-08-17 NOTE — Progress Notes (Signed)
Physical Therapy Treatment ?Patient Details ?Name: Barbara Vega ?MRN: [id number] ?DOB: April 04, 1934 ?Today's Date: 08/17/2021 ? ? ?History of Present Illness Barbara Vega is a 88 y.o. female with medical history significant for alzheimer's dementia, cva, who presents with the above. Ambulates without assistance at baseline. Was out walking in yard with neighbors earlier today when tripped and fell landing on left hip. No other trauma no LOC. Unable to ambulate after. Denies chest pain or cough or fever. Pt is s/p L hip hemiarthroplasty 08/12/21. ? ?  ?PT Comments  ? ? Pt with increased anxiety today.  1lpm O2 for comfort - sats 98% on room air.  Daughter in room and doing a good job calming patient.  She does agree to activity.  To EOB with max a x 2 and remains sitting for about 13 minutes.  She does c/o nausea and dizziness BP 153/68  P 80.  She is able to stand x 2.  On first attempt needs assist to shuffle feet 2 steps to right but on second she is able to do so on her own with much encouragement.  She does sit quickly each attempt and is unable to safely transfer to chair.  Returns to supine with max a x 2.  She does c/o sharp pain right breast after activity.  Stated it feels in her breast and not heart/lung and continues to hold it upon return to bed.  LLE PROM and pt washes her face with warm cloth with OT. RN notified of request for pain meds.  Updated daughter who waited in hallway. ? ?Co-tx with OT and 1 unit billed each per protocol. ?  ?Recommendations for follow up therapy are one component of a multi-disciplinary discharge planning process, led by the attending physician.  Recommendations may be updated based on patient status, additional functional criteria and insurance authorization. ? ?Follow Up Recommendations ? Skilled nursing-short term rehab (<3 hours/day) ?  ?  ?Assistance Recommended at Discharge    ?Patient can return home with the following Two people to help with walking and/or transfers;Two  people to help with bathing/dressing/bathroom;Help with stairs or ramp for entrance;Assist for transportation;Assistance with cooking/housework ?  ?Equipment Recommendations ? Other (comment)  ?  ?Recommendations for Other Services   ? ? ?  ?Precautions / Restrictions Precautions ?Precautions: Posterior Hip ?Precaution Booklet Issued: Yes (comment) ?Restrictions ?Weight Bearing Restrictions: Yes ?LLE Weight Bearing: Weight bearing as tolerated  ?  ? ?Mobility ? Bed Mobility ?Overal bed mobility: Needs Assistance ?Bed Mobility: Supine to Sit, Sit to Supine ?  ?  ?Supine to sit: Max assist, +2 for physical assistance ?Sit to supine: Max assist, +2 for safety/equipment ?  ?  ?  ? ?Transfers ?Overall transfer level: Needs assistance ?Equipment used: Rolling walker (2 wheels) ?Transfers: Sit to/from Stand ?Sit to Stand: Mod assist, +2 physical assistance ?  ?  ?  ?  ?  ?  ?  ? ?Ambulation/Gait ?Ambulation/Gait assistance: Mod assist, +2 physical assistance ?Gait Distance (Feet): 2 Feet ?Assistive device: Rolling walker (2 wheels) ?  ?Gait velocity: decreased ?  ?  ?General Gait Details: stood x 2 at bedside and is able to take a few small steps along bed with much encouragement but sits quickly. ? ? ?Stairs ?  ?  ?  ?  ?  ? ? ?Wheelchair Mobility ?  ? ?Modified Rankin (Stroke Patients Only) ?  ? ? ?  ?Balance Overall balance assessment: Needs assistance ?Sitting-balance support: Feet supported ?Sitting balance-Leahy  Scale: Fair ?  ?  ?Standing balance support: Bilateral upper extremity supported, During functional activity, Reliant on assistive device for balance ?Standing balance-Leahy Scale: Poor ?Standing balance comment: Reliant on AD, decreased WB'ing on LLE ?  ?  ?  ?  ?  ?  ?  ?  ?  ?  ?  ?  ? ?  ?Cognition Arousal/Alertness: Awake/alert ?Behavior During Therapy: [facility] for tasks assessed/performed ?Overall Cognitive Status: History of cognitive impairments - at baseline ?  ?  ?  ?  ?  ?  ?  ?  ?  ?  ?  ?  ?  ?  ?  ?   ?  ?  ?  ? ?  ?Exercises Other Exercises ?Other Exercises: supine BLE AAROM RLE, PROM LLE  x 10 ? ?  ?General Comments   ?  ?  ? ?Pertinent Vitals/Pain Pain Assessment ?Pain Assessment: Faces ?Faces Pain Scale: Hurts even more ?Pain Location: L hip with movement, R breast with mobility - severity does not seem to be consistant with amount of activity. ?Pain Descriptors / Indicators: Discomfort, Grimacing, Guarding  ? ? ?Home Living Family/patient expects to be discharged to:: Skilled nursing facility ?  ?  ?  ?  ?  ?  ?  ?  ?  ?   ?  ?Prior Function    ?  ?  ?   ? ?PT Goals (current goals can now be found in the care plan section) Progress towards PT goals: Progressing toward goals ? ?  ?Frequency ? ? ? 7X/week ? ? ? ?  ?PT Plan Current plan remains appropriate  ? ? ?Co-evaluation PT/OT/SLP Co-Evaluation/Treatment: Yes ?Reason for Co-Treatment: Complexity of the patient's impairments (multi-system involvement);For patient/therapist safety ?PT goals addressed during session: Mobility/safety with mobility;Balance;Strengthening/ROM ?OT goals addressed during session: ADL's and self-care;Proper use of Adaptive equipment and DME ?  ? ?  ?AM-PAC PT "6 Clicks" Mobility   ?Outcome Measure ? Help needed turning from your back to your side while in a flat bed without using bedrails?: A Lot ?Help needed moving from lying on your back to sitting on the side of a flat bed without using bedrails?: Total ?Help needed moving to and from a bed to a chair (including a wheelchair)?: A Lot ?Help needed standing up from a chair using your arms (e.g., wheelchair or bedside chair)?: A Lot ?Help needed to walk in hospital room?: A Lot ?Help needed climbing 3-5 steps with a railing? : Total ?6 Click Score: 10 ? ?  ?End of Session Equipment Utilized During Treatment: Gait belt ?Activity Tolerance: Patient tolerated treatment well;Patient limited by pain ?Patient left: in bed;with call bell/phone within reach;with bed alarm set;with  nursing/sitter in room ?Nurse Communication: Mobility status ?PT Visit Diagnosis: Unsteadiness on feet (R26.81);Muscle weakness (generalized) (M62.81);History of falling (Z91.81);Other abnormalities of gait and mobility (R26.89);Difficulty in walking, not elsewhere classified (R26.2);Pain ?Pain - Right/Left: Left ?Pain - part of body: Hip ?  ? ? ?Time: 3845-3646 ?PT Time Calculation (min) (ACUTE ONLY): 23 min ? ?Charges:  $Gait Training: 8-22 mins          ?         Chesley Noon, PTA ?08/17/21, 10:31 AM ? ?

## 2021-08-17 NOTE — Assessment & Plan Note (Signed)
Home dose of Aricept and Depakote was held due to hyponatremia. ?Daughter was very concerned about Depakote as patient becomes very aggressive. ?-Restarting Depakote ?-Keep holding Aricept ?

## 2021-08-17 NOTE — Progress Notes (Signed)
Occupational Therapy Treatment ?Patient Details ?Name: Barbara Vega ?MRN: [id number] ?DOB: 02-26-1934 ?Today's Date: 08/17/2021 ? ? ?History of present illness STEPHAINE BRESHEARS is a 88 y.o. female with medical history significant for alzheimer's dementia, cva, who presents with the above. Ambulates without assistance at baseline. Was out walking in yard with neighbors earlier today when tripped and fell landing on left hip. No other trauma no LOC. Unable to ambulate after. Denies chest pain or cough or fever. Pt is s/p L hip hemiarthroplasty 08/12/21. ?  ?OT comments ? Pt seen for OT co-tx this morning with PT. Pt appears anxious, endorsing feeling slightly SOB prior to moving, despite SpO2 >98%. Pt required MAX A +2 bed mobility and MOD A +2 for STS transfers EOB with RW requiring VC for hand/foot placement and RW mgt. Pt unable to recall any precautions. Pt instructed in maintaining precautions during ADL/mobility, ADL transfer training, distraction techniques and PLB to support breath recovery and support anxiety mgt with movement. RN notified of pain and O2 sats. Pt will benefit from additional skilled OT services to maximize return to PLOF.  ? ?Recommendations for follow up therapy are one component of a multi-disciplinary discharge planning process, led by the attending physician.  Recommendations may be updated based on patient status, additional functional criteria and insurance authorization. ?   ?Follow Up Recommendations ? Skilled nursing-short term rehab (<3 hours/day)  ?  ?Assistance Recommended at Discharge Frequent or constant Supervision/Assistance  ?Patient can return home with the following ? A lot of help with bathing/dressing/bathroom;Direct supervision/assist for financial management;Assistance with cooking/housework;Help with stairs or ramp for entrance;Direct supervision/assist for medications management;Two people to help with walking and/or transfers ?  ?Equipment Recommendations ? None  recommended by OT  ?  ?Recommendations for Other Services   ? ?  ?Precautions / Restrictions Precautions ?Precautions: Posterior Hip ?Precaution Booklet Issued: Yes (comment) ?Restrictions ?Weight Bearing Restrictions: Yes ?LLE Weight Bearing: Weight bearing as tolerated  ? ? ?  ? ?Mobility Bed Mobility ?Overal bed mobility: Needs Assistance ?Bed Mobility: Supine to Sit, Sit to Supine ?  ?  ?Supine to sit: Max assist, +2 for physical assistance ?Sit to supine: Max assist, +2 for safety/equipment ?  ?General bed mobility comments: VC for bed rail use and sequencing to improve pt's participation ?  ? ?Transfers ?Overall transfer level: Needs assistance ?Equipment used: Rolling walker (2 wheels) ?Transfers: Sit to/from Stand ?Sit to Stand: Mod assist, +2 physical assistance ?  ?  ?  ?  ?  ?General transfer comment: VC for RW mgt, scooting R foot for a couple slight lateral steps EOB ?  ?  ?Balance Overall balance assessment: Needs assistance ?Sitting-balance support: Feet supported, Single extremity supported ?Sitting balance-Leahy Scale: Fair ?  ?  ?Standing balance support: Bilateral upper extremity supported, During functional activity, Reliant on assistive device for balance ?Standing balance-Leahy Scale: Poor ?Standing balance comment: Reliant on AD, decreased WB'ing on LLE ?  ?  ?  ?  ?  ?  ?  ?  ?  ?  ?  ?   ? ?ADL either performed or assessed with clinical judgement  ? ?ADL Overall ADL's : Needs assistance/impaired ?  ?  ?Grooming: Bed level;Set up;Wash/dry face ?  ?  ?  ?  ?  ?  ?  ?  ?  ?  ?  ?  ?  ?  ?  ?  ?  ?  ? ?Extremity/Trunk Assessment   ?  ?  ?  ?  ?  ? ?  Vision   ?  ?  ?Perception   ?  ?Praxis   ?  ? ?Cognition Arousal/Alertness: Awake/alert ?Behavior During Therapy: Anxious ?Overall Cognitive Status: History of cognitive impairments - at baseline ?  ?  ?  ?  ?  ?  ?  ?  ?  ?  ?  ?  ?  ?  ?  ?  ?  ?  ?  ?   ?Exercises Other Exercises ?Other Exercises: Pt instructed in maintaining precautions during  ADL/mobility, ADL transfer training, distraction techniques and PLB to support breath recovery and support anxiety mgt with mvoement ? ?  ?Shoulder Instructions   ? ? ?  ?General Comments    ? ? ?Pertinent Vitals/ Pain       Pain Assessment ?Pain Assessment: Faces ?Faces Pain Scale: Hurts even more ?Pain Location: L hip with movement, R breast with mobility - severity does not seem to be consistant with amount of activity. ?Pain Descriptors / Indicators: Discomfort, Grimacing, Guarding ?Pain Intervention(s): Patient requesting pain meds-RN notified, Limited activity within patient's tolerance, Repositioned, Premedicated before session ? ?Home Living   ?  ?  ?  ?  ?  ?  ?  ?  ?  ?  ?  ?  ?  ?  ?  ?  ?  ?  ? ?  ?Prior Functioning/Environment    ?  ?  ?  ?   ? ?Frequency ? Min 3X/week  ? ? ? ? ?  ?Progress Toward Goals ? ?OT Goals(current goals can now be found in the care plan section) ? Progress towards OT goals: Progressing toward goals ? ?Acute Rehab OT Goals ?Patient Stated Goal: feel better ?OT Goal Formulation: With patient ?Time For Goal Achievement: 08/28/21 ?Potential to Achieve Goals: Good  ?Plan Discharge plan remains appropriate;Frequency remains appropriate   ? ?Co-evaluation ? ? ? PT/OT/SLP Co-Evaluation/Treatment: Yes ?Reason for Co-Treatment: Complexity of the patient's impairments (multi-system involvement);For patient/therapist safety;To address functional/ADL transfers ?PT goals addressed during session: Mobility/safety with mobility;Balance;Strengthening/ROM ?OT goals addressed during session: ADL's and self-care;Proper use of Adaptive equipment and DME ?  ? ?  ?AM-PAC OT "6 Clicks" Daily Activity     ?Outcome Measure ? ? Help from another person eating meals?: A Little ?Help from another person taking care of personal grooming?: A Little ?Help from another person toileting, which includes using toliet, bedpan, or urinal?: Total ?Help from another person bathing (including washing, rinsing,  drying)?: A Lot ?Help from another person to put on and taking off regular upper body clothing?: A Lot ?Help from another person to put on and taking off regular lower body clothing?: Total ?6 Click Score: 12 ? ?  ?End of Session Equipment Utilized During Treatment: Gait belt;Rolling walker (2 wheels) ? ?OT Visit Diagnosis: Unsteadiness on feet (R26.81);Muscle weakness (generalized) (M62.81);History of falling (Z91.81) ?  ?Activity Tolerance Patient limited by pain ?  ?Patient Left in bed;with call bell/phone within reach;with bed alarm set ?  ?Nurse Communication Mobility status;Patient requests pain meds ?  ? ?   ? ?Time: 7654-6503 ?OT Time Calculation (min): 23 min ? ?Charges: OT General Charges ?$OT Visit: 1 Visit ?OT Treatments ?$Self Care/Home Management : 8-22 mins ? ?Ardeth Perfect., MPH, MS, OTR/L ?ascom [phone] ?08/17/21, 1:29 PM ? ?

## 2021-08-17 NOTE — Hospital Course (Addendum)
Taken from prior notes. ? ?88 yo female who presented to the ER after mechanical fall for which she sustained a hip fracture s/p left hip hemiarthroplasty on 08/12/2021. ?Currently stable and was supposed to go to SNF on 08/15/2021 but developed some dysuria and bleeding from surgical site. ?Continue to have some oozing from surgical site.  Hemoglobin stable ?Per orthopedic surgery stable for discharge and will need dressing change as needed. ?Lovenox prophylaxis was discontinued and she was started on aspirin for DVT prophylaxis. ? ?Also found to have hyponatremia.  Home dose of Depakote was held, hyponatremia labs with low serum osmolality, normal urine osmolality and urinary sodium less than 10. ?Sodium improving.  Daughter was very concerned about holding home Depakote, stating that she becomes very agitated and aggressive with her history of underlying dementia. ?Depakote was restarted on 08/17/2021. ? ?Sodium continued to improve after restarting Depakote yesterday. ?Continue to have mild serous oozing from the wound and need frequent dressing changes. ?Hemoglobin seems stable. ? ?Follow-up with orthopedic surgery in 2 weeks. ? ?Patient should continue with supplement and current medications and follow-up with her providers. ?

## 2021-08-17 NOTE — Assessment & Plan Note (Signed)
Prior history of CVA. ?-Continue aspirin and statin ?

## 2021-08-17 NOTE — Assessment & Plan Note (Signed)
S/p left hip hemiarthroplasty on 08/12/2021. ?Continue to have mild oozing from surgical site.  Hemoglobin stable. ?PT/OT recommending SNF ?-Continue to monitor ?-Continue with pain management ?-Should be able to go to rehab tomorrow as orthopedic cleared her ?

## 2021-08-17 NOTE — Progress Notes (Signed)
Per Dr. Mack Guise, "Change dressing as needed at [facility] and SNF. Patient can be discharged from orthopedic standpoint. " Dr. Reesa Chew aware. ?

## 2021-08-17 NOTE — Assessment & Plan Note (Signed)
Sodium improved to 130 today.  Seems chronic. ?Hyponatremia labs with low serum osmolality and IV fluid was held. ?Normal urine osmolality and urinary sodium less than 10. ?Home Depakote and Aricept was held. ?-Restart home Depakote at daughter's request ?-Keep holding Aricept ?-Monitor sodium ? ?

## 2021-08-17 NOTE — Consult Note (Signed)
WOC consulted however patient to DC today to [facility]  ?Antonios Ostrow [facility], CNS, CWON-AP ?[phone]  ?

## 2021-08-17 NOTE — TOC Progression Note (Signed)
Transition of Care (TOC) - Progression Note  ? ? ?Patient Details  ?Name: Barbara Vega ?MRN: [id number] ?Date of Birth: October 21, 1933 ? ?Transition of Care (TOC) CM/SW Contact  ?Conception Oms, RN ?Phone Number: ?08/17/2021, 12:12 PM ? ?Clinical Narrative:   Ins Josem Kaufmann is good to go to [facility] [phone] thru 4/4 ? ? ? ?Expected Discharge Plan: [place] ?Barriers to Discharge: Continued Medical Work up, SNF Pending bed offer, Awaiting State Approval ([place]) ? ?Expected Discharge Plan and Services ?Expected Discharge Plan: [place] ?  ?  ?  ?  ?                ?  ?  ?  ?  ?  ?  ?  ?  ?  ?  ? ? ?Social Determinants of Health (SDOH) Interventions ?  ? ?Readmission Risk Interventions ?   ? View : No data to display.  ?  ?  ?  ? ? ?

## 2021-08-17 NOTE — Assessment & Plan Note (Deleted)
Sodium improved to 130 today.  Seems chronic. ?Hyponatremia labs with low serum osmolality and IV fluid was held. ?Normal urine osmolality and urinary sodium less than 10. ?Home Depakote and Aricept was held. ?-Restart home Depakote at daughter's request ?-Keep holding Aricept ?-Monitor sodium ?

## 2021-08-17 NOTE — Care Management Important Message (Signed)
Important Message ? ?Patient Details  ?Name: Barbara Vega ?MRN: [id number] ?Date of Birth: 14-Jul-1933 ? ? ?Medicare Important Message Given:  Yes ? ? ? ? ?[name] ?08/17/2021, 1:26 PM ?

## 2021-08-17 NOTE — Progress Notes (Signed)
?  Progress Note ? ? ?Patient: Barbara Vega ZYS:[id number] DOB: 1933-11-19 DOA: 08/11/2021     6 ?DOS: the patient was seen and examined on 08/17/2021 ?  ?Brief hospital course: ?Taken from prior notes. ? ?88 yo female who presented to the ER after mechanical fall for which she sustained a hip fracture s/p left hip hemiarthroplasty on 08/12/2021. ?Currently stable and was supposed to go to SNF on 08/15/2021 but developed some dysuria and bleeding from surgical site. ?Continue to have some oozing from surgical site.  Hemoglobin stable ?Per orthopedic surgery stable for discharge and will need dressing change as needed. ?Lovenox prophylaxis was discontinued and she was started on aspirin for DVT prophylaxis. ? ?Also found to have hyponatremia.  Home dose of Depakote was held, hyponatremia labs with low serum osmolality, normal urine osmolality and urinary sodium less than 10. ?Sodium improving.  Daughter was very concerned about holding home Depakote, stating that she becomes very agitated and aggressive with her history of underlying dementia. ?Depakote was restarted on 08/17/2021. ? ?If remains stable should be able to go to rehab tomorrow. ? ? ?Assessment and Plan: ?* Hip fracture ([place]) ?S/p left hip hemiarthroplasty on 08/12/2021. ?Continue to have mild oozing from surgical site.  Hemoglobin stable. ?PT/OT recommending SNF ?-Continue to monitor ?-Continue with pain management ?-Should be able to go to rehab tomorrow as orthopedic cleared her ? ?Stroke [facility]) ?Prior history of CVA. ?-Continue aspirin and statin ? ?Alzheimer's dementia without behavioral disturbance ([place]) ?Home dose of Aricept and Depakote was held due to hyponatremia. ?Daughter was very concerned about Depakote as patient becomes very aggressive. ?-Restarting Depakote ?-Keep holding Aricept ? ?Hyponatremia ?Sodium improved to 130 today.  Seems chronic. ?Hyponatremia labs with low serum osmolality and IV fluid was held. ?Normal urine osmolality and urinary  sodium less than 10. ?Home Depakote and Aricept was held. ?-Restart home Depakote at daughter's request ?-Keep holding Aricept ?-Monitor sodium ? ? ?  ? ?Subjective: Patient was having some left hip pain.  Daughter at bedside.  She was concerned about swelling dressing.  Mild bloody oozing with a lot of surrounding ecchymosis. ? ?Physical Exam: ?Vitals:  ? 08/17/21 0611 08/17/21 0730 08/17/21 1022 08/17/21 1542  ?BP: 140/71 (!) 146/53 (!) 153/68 (!) 135/58  ?Pulse: 70 66  81  ?Resp: '20 18  15  '$ ?Temp: 98.5 ?F (36.9 ?C) 97.8 ?F (36.6 ?C)  99.2 ?F (37.3 ?C)  ?TempSrc:      ?SpO2: 98% 97% 98% 96%  ?Weight:      ?Height:      ? ?General.  Frail elderly lady, in no acute distress.  Mostly serous oozing from surgical site ?Pulmonary.  Lungs clear bilaterally, normal respiratory effort. ?CV.  Regular rate and rhythm, no JVD, rub or murmur. ?Abdomen.  Soft, nontender, nondistended, BS positive. ?CNS.  Alert and oriented to self.  No focal neurologic deficit. ?Extremities.  No edema, no cyanosis, pulses intact and symmetrical. ?Psychiatry.  Judgment and insight appears impaired ? ?Data Reviewed: ?Prior notes, labs and images reviewed. ? ?Family Communication: Daughter at bedside ? ?Disposition: ?Status is: Inpatient ?Remains inpatient appropriate because: Severity of illness ? ? Planned Discharge Destination: Skilled nursing facility ? ?Time spent: 45 minutes ? ?This record has been created using Systems analyst. Errors have been sought and corrected,but may not always be located. Such creation errors do not reflect on the standard of care. ? ?Author: ?Lorella Nimrod, MD ?08/17/2021 4:51 PM ? ?For on call review [email or web address].  ?

## 2021-08-18 DIAGNOSIS — M810 Age-related osteoporosis without current pathological fracture: Secondary | ICD-10-CM

## 2021-08-18 DIAGNOSIS — I10 Essential (primary) hypertension: Secondary | ICD-10-CM

## 2021-08-18 LAB — BASIC METABOLIC PANEL
Anion gap: 4 — ABNORMAL LOW (ref 5–15)
BUN: 12 mg/dL (ref 8–23)
CO2: 30 mmol/L (ref 22–32)
Calcium: 8.4 mg/dL — ABNORMAL LOW (ref 8.9–10.3)
Chloride: 98 mmol/L (ref 98–111)
Creatinine, Ser: 0.47 mg/dL (ref 0.44–1.00)
GFR, Estimated: >60 mL/min (ref >60)
Glucose, Bld: 114 mg/dL — ABNORMAL HIGH (ref 70–99)
Potassium: 3.6 mmol/L (ref 3.5–5.1)
Sodium: 132 mmol/L — ABNORMAL LOW (ref 135–145)

## 2021-08-18 LAB — CBC
HCT: 27.9 % — ABNORMAL LOW (ref 36.0–46.0)
Hemoglobin: 9 g/dL — ABNORMAL LOW (ref 12.0–15.0)
MCH: 28.9 pg (ref 26.0–34.0)
MCHC: 32.3 g/dL (ref 30.0–36.0)
MCV: 89.7 fL (ref 80.0–100.0)
Platelets: 415 10*3/uL — ABNORMAL HIGH (ref 150–400)
RBC: 3.11 MIL/uL — ABNORMAL LOW (ref 3.87–5.11)
RDW: 14.8 % (ref 11.5–15.5)
WBC: 7.1 10*3/uL (ref 4.0–10.5)
nRBC: 0.3 % — ABNORMAL HIGH (ref 0.0–0.2)

## 2021-08-18 MED ORDER — ACETAMINOPHEN 325 MG PO TABS: 325.0000 mg | ORAL_TABLET | Freq: Four times a day (QID) | ORAL | Status: AC | PRN

## 2021-08-18 MED ORDER — HYDROCODONE-ACETAMINOPHEN 5-325 MG PO TABS: 1.0000 | ORAL_TABLET | ORAL | 0 refills | Status: AC | PRN

## 2021-08-18 MED ORDER — ALUM & MAG HYDROXIDE-SIMETH 200-200-20 MG/5ML PO SUSP: 30.0000 mL | ORAL | 0 refills | Status: AC | PRN

## 2021-08-18 MED ORDER — BISACODYL 10 MG RE SUPP: 10.0000 mg | Freq: Every day | RECTAL | 0 refills | Status: AC | PRN

## 2021-08-18 MED ORDER — POLYETHYLENE GLYCOL 3350 17 G PO PACK: 17.0000 g | PACK | Freq: Every day | ORAL | 0 refills | Status: AC | PRN

## 2021-08-18 MED ORDER — METHOCARBAMOL 500 MG PO TABS
500.0000 mg | ORAL_TABLET | Freq: Three times a day (TID) | ORAL | Status: AC | PRN
Start: 2021-08-18 — End: ?

## 2021-08-18 MED ORDER — FE FUMARATE-B12-VIT C-FA-IFC PO CAPS
1.0000 | ORAL_CAPSULE | Freq: Two times a day (BID) | ORAL | Status: DC
  Filled 2021-08-18: qty 1

## 2021-08-18 MED ORDER — MELATONIN 5 MG PO TABS: 5.0000 mg | ORAL_TABLET | Freq: Every evening | ORAL | 0 refills | Status: AC | PRN

## 2021-08-18 MED ORDER — FE FUMARATE-B12-VIT C-FA-IFC PO CAPS: 1.0000 | ORAL_CAPSULE | Freq: Two times a day (BID) | ORAL | Status: AC

## 2021-08-18 MED ORDER — SENNA 8.6 MG PO TABS: 1.0000 | ORAL_TABLET | Freq: Two times a day (BID) | ORAL | 0 refills | Status: AC

## 2021-08-18 MED ORDER — PANTOPRAZOLE SODIUM 20 MG PO TBEC: 20.0000 mg | DELAYED_RELEASE_TABLET | Freq: Every day | ORAL | Status: AC

## 2021-08-18 MED ORDER — DOCUSATE SODIUM 100 MG PO CAPS: 100.0000 mg | ORAL_CAPSULE | Freq: Two times a day (BID) | ORAL | 0 refills | Status: AC

## 2021-08-18 NOTE — Progress Notes (Signed)
Patient and family refused stool softeners. Patient already had multiple BM's per family and chart. MD aware. ? ?

## 2021-08-18 NOTE — TOC Progression Note (Signed)
Transition of Care (TOC) - Progression Note  ? ? ?Patient Details  ?Name: Lafawn Lenoir Dobosz ?MRN: [id number] ?Date of Birth: 08/08/1933 ? ?Transition of Care (TOC) CM/SW Contact  ?Conception Oms, RN ?Phone Number: ?08/18/2021, 10:04 AM ? ?Clinical Narrative:   Spoke with [name] the patient's daughter I let her Know that the patient will transport via EMS to [facility] room 707 today,  ? ?Called ems to transport she is number 3 on list ? ?Expected Discharge Plan: [place] ?Barriers to Discharge: Continued Medical Work up, SNF Pending bed offer, Awaiting State Approval ([place]) ? ?Expected Discharge Plan and Services ?Expected Discharge Plan: [place] ?  ?  ?  ?  ?Expected Discharge Date: 08/17/21               ?  ?  ?  ?  ?  ?  ?  ?  ?  ?  ? ? ?Social Determinants of Health (SDOH) Interventions ?  ? ?Readmission Risk Interventions ?   ? View : No data to display.  ?  ?  ?  ? ? ?

## 2021-08-18 NOTE — Plan of Care (Signed)
  Problem: Health Behavior/Discharge Planning: Goal: Ability to manage health-related needs will improve Outcome: Progressing   

## 2021-08-18 NOTE — Discharge Summary (Signed)
?Physician Discharge Summary ?  ?Patient: Barbara Vega MRN: [id number] DOB: 12/02/1933  ?Admit date:     08/11/2021  ?Discharge date: 08/18/21  ?Discharge Physician: Lorella Nimrod  ? ?PCP: [name], MD  ? ?Recommendations at discharge:  ?Please obtain CBC and BMP in 1 week ?Please change the dressing as needed if gets soiled. ?Follow-up with orthopedic surgery in 1 to 2 weeks ?Follow-up with primary care provider in 1 week ? ?Discharge Diagnoses: ?Principal Problem: ?  Hip fracture ([place]) ?Active Problems: ?  Osteoporosis, post-menopausal ?  Essential hypertension ?  Hyponatremia ?  Alzheimer's dementia without behavioral disturbance ([place]) ?  Stroke [facility]) ? ?Resolved Problems: ?  Acute hyponatremia ? ?Hospital Course: ?Taken from prior notes. ? ?88 yo female who presented to the ER after mechanical fall for which she sustained a hip fracture s/p left hip hemiarthroplasty on 08/12/2021. ?Currently stable and was supposed to go to SNF on 08/15/2021 but developed some dysuria and bleeding from surgical site. ?Continue to have some oozing from surgical site.  Hemoglobin stable ?Per orthopedic surgery stable for discharge and will need dressing change as needed. ?Lovenox prophylaxis was discontinued and she was started on aspirin for DVT prophylaxis. ? ?Also found to have hyponatremia.  Home dose of Depakote was held, hyponatremia labs with low serum osmolality, normal urine osmolality and urinary sodium less than 10. ?Sodium improving.  Daughter was very concerned about holding home Depakote, stating that she becomes very agitated and aggressive with her history of underlying dementia. ?Depakote was restarted on 08/17/2021. ? ?Sodium continued to improve after restarting Depakote yesterday. ?Continue to have mild serous oozing from the wound and need frequent dressing changes. ?Hemoglobin seems stable. ? ?Follow-up with orthopedic surgery in 2 weeks. ? ?Patient should continue with supplement and current medications and  follow-up with her providers. ? ?Assessment and Plan: ?* Hip fracture ([place]) ?S/p left hip hemiarthroplasty on 08/12/2021. ?Continue to have mild oozing from surgical site.  Hemoglobin stable. ?PT/OT recommending SNF ?-Continue to monitor ?-Continue with pain management ?-Should be able to go to rehab tomorrow as orthopedic cleared her ? ?Stroke [facility]) ?Prior history of CVA. ?-Continue aspirin and statin ? ?Alzheimer's dementia without behavioral disturbance ([place]) ?Home dose of Aricept and Depakote was held due to hyponatremia. ?Daughter was very concerned about Depakote as patient becomes very aggressive. ?-Restarting Depakote ?-Keep holding Aricept ? ?Hyponatremia ?Sodium improved to 130 today.  Seems chronic. ?Hyponatremia labs with low serum osmolality and IV fluid was held. ?Normal urine osmolality and urinary sodium less than 10. ?Home Depakote and Aricept was held. ?-Restart home Depakote at daughter's request ?-Keep holding Aricept ?-Monitor sodium ? ? ? ?Pain control - [employer] Controlled Substance Reporting System database was reviewed. and patient was instructed, not to drive, operate heavy machinery, perform activities at heights, swimming or participation in water activities or provide baby-sitting services while on Pain, Sleep and Anxiety Medications; until their outpatient Physician has advised to do so again. Also recommended to not to take more than prescribed Pain, Sleep and Anxiety Medications.  ? ?Consultants: Orthopedic surgery ?Procedures performed: Left hip hemiarthroplasty ?Disposition: Skilled nursing facility ?Diet recommendation:  ?Discharge Diet Orders (From admission, onward)  ? ?  Start     Ordered  ? 08/18/21 0000  Diet - low sodium heart healthy       ? 08/18/21 0935  ? ?  ?  ? ?  ? ?Regular diet ?DISCHARGE MEDICATION: ?Allergies as of 08/18/2021   ? ?  Reactions  ? Codone [hydrocodone] Nausea And Vomiting  ? Codeine   ? ?  ? ?  ?Medication List  ?  ? ?TAKE these medications    ? ?acetaminophen 325 MG tablet ?Commonly known as: TYLENOL ?Take 1-2 tablets (325-650 mg total) by mouth every 6 (six) hours as needed for mild pain (pain score 1-3 or temp > 100.5). ?  ?alum & mag hydroxide-simeth 200-200-20 MG/5ML suspension ?Commonly known as: MAALOX/MYLANTA ?Take 30 mLs by mouth every 4 (four) hours as needed for indigestion. ?  ?aspirin 81 MG EC tablet ?Take 81 mg by mouth daily. ?  ?atorvastatin 20 MG tablet ?Commonly known as: LIPITOR ?Take 1 tablet by mouth daily. ?  ?bisacodyl 10 MG suppository ?Commonly known as: DULCOLAX ?Place 1 suppository (10 mg total) rectally daily as needed for moderate constipation. ?  ?divalproex 125 MG DR tablet ?Commonly known as: DEPAKOTE ?Take 250-375 mg by mouth 2 (two) times daily. ?  ?docusate sodium 100 MG capsule ?Commonly known as: COLACE ?Take 1 capsule (100 mg total) by mouth 2 (two) times daily. ?  ?donepezil 10 MG tablet ?Commonly known as: ARICEPT ?Take 10 mg by mouth daily. ?  ?ferrous [id number]-[id number] C-folic acid capsule ?Commonly known as: TRINSICON / FOLTRIN ?Take 1 capsule by mouth 2 (two) times daily after a meal. ?  ?HYDROcodone-acetaminophen 5-325 MG tablet ?Commonly known as: NORCO/VICODIN ?Take 1-2 tablets by mouth every 4 (four) hours as needed for moderate pain (pain score 4-6). ?  ?melatonin 5 MG Tabs ?Take 1 tablet (5 mg total) by mouth at bedtime as needed. ?  ?methocarbamol 500 MG tablet ?Commonly known as: ROBAXIN ?Take 1 tablet (500 mg total) by mouth every 8 (eight) hours as needed for muscle spasms. ?  ?pantoprazole 20 MG tablet ?Commonly known as: PROTONIX ?Take 1 tablet (20 mg total) by mouth daily. ?  ?polyethylene glycol 17 g packet ?Commonly known as: MIRALAX / GLYCOLAX ?Take 17 g by mouth daily as needed for mild constipation. ?  ?senna 8.6 MG Tabs tablet ?Commonly known as: SENOKOT ?Take 1 tablet (8.6 mg total) by mouth 2 (two) times daily. ?  ? ?  ? ?  ?  ? ? ?  ?Discharge Care Instructions  ?(From admission,  onward)  ?  ? ? ?  ? ?  Start     Ordered  ? 08/18/21 0000  Leave dressing on - Keep it clean, dry, and intact until clinic visit       ?Comments: Change dressing as needed if gets soiled.  ? 08/18/21 0935  ? ?  ?  ? ?  ? ? Contact information for follow-up providers   ? ? [place], MD. Schedule an appointment as soon as possible for a visit in 1 week(s).   ?Specialty: Orthopedic Surgery ?Contact information: ?[place] [address] ?[phone] ? ? ?  ?  ? ? [name], MD. Schedule an appointment as soon as possible for a visit in 1 week(s).   ?Specialty: Family Medicine ?Contact information: ?[place] ?[facility] ?[place] [address] ?[phone] ? ? ?  ?  ? ?  ?  ? ? Contact information for after-discharge care   ? ? Destination   ? ? [facility]erred [facility] .   ?Service: Skilled Nursing ?Contact information: ?[address] ?[place] ?[phone] ? ?  ?  ? ?  ?  ? ?  ?  ? ?  ? ?Discharge  Exam: ?Filed Weights  ? 08/12/21 1537  ?Weight: 59 kg  ? ?General.  Frail elderly lady, in no acute distress. ?Pulmonary.  Lungs clear bilaterally, normal respiratory effort. ?CV.  Regular rate and rhythm, no JVD, rub or murmur. ?Abdomen.  Soft, nontender, nondistended, BS positive. ?CNS.  Alert and oriented .  No focal neurologic deficit. ?Extremities.  No edema, no cyanosis, pulses intact and symmetrical. ?Psychiatry.  Appears to have cognitive impairment ? ?Condition at discharge: stable ? ?The results of significant diagnostics from this hospitalization (including imaging, microbiology, ancillary and laboratory) are listed below for reference.  ? ?Imaging Studies: ?DG Chest Portable 1 View ? ?Result Date: 08/11/2021 ?CLINICAL DATA:  Preoperative exam for hip fracture. EXAM: PORTABLE CHEST 1 VIEW COMPARISON:  05/31/2021 FINDINGS: Heart size upper limits of normal. Aortic atherosclerosis. The lungs are clear. No infiltrate, collapse  or effusion. Old augmented lower thoracic compression fractures. IMPRESSION: No active disease. Electronically Signed   By: Nelson Chimes M.D.   On: 08/11/2021 14:21  ? ?DG Chest Port 1V same Day ? ?Result Date: 3/

## 2021-08-18 NOTE — Plan of Care (Signed)

## 2021-08-18 NOTE — Progress Notes (Signed)
Report called to facility

## 2021-08-18 NOTE — Plan of Care (Signed)
?  Problem: Health Behavior/Discharge Planning: ?Goal: Ability to manage health-related needs will improve ?08/18/2021 0213 by Teodoro Spray, RN ?Outcome: Progressing ?08/18/2021 0118 by Teodoro Spray, RN ?Outcome: Progressing ?  ?

## 2021-08-18 NOTE — Progress Notes (Signed)
Physical Therapy Treatment ?Patient Details ?Name: Barbara Vega ?MRN: [id number] ?DOB: 1933-12-08 ?Today's Date: 08/18/2021 ? ? ?History of Present Illness Barbara Vega is a 88 y.o. female with medical history significant for alzheimer's dementia, cva, who presents with the above. Ambulates without assistance at baseline. Was out walking in yard with neighbors earlier today when tripped and fell landing on left hip. No other trauma no LOC. Unable to ambulate after. Denies chest pain or cough or fever. Pt is s/p L hip hemiarthroplasty 08/12/21. ? ?  ?PT Comments  ? ? Pt was pleasant and motivated to participate during the session and put forth good effort throughout. Pt required physical assistance with bed mobility and transfers and cues for sequencing with all functional tasks. Pt antalgic on the L hip with all movement but most notably with ambulation where she demonstrated very antalgic step-to gait pattern.  Pt reported no adverse symptoms during the session other than L hip pain. Pt will benefit from PT services in a SNF setting upon discharge to safely address deficits listed in patient problem list for decreased caregiver assistance and eventual return to PLOF. ? ?   ?Recommendations for follow up therapy are one component of a multi-disciplinary discharge planning process, led by the attending physician.  Recommendations may be updated based on patient status, additional functional criteria and insurance authorization. ? ?Follow Up Recommendations ? Skilled nursing-short term rehab (<3 hours/day) ?  ?  ?Assistance Recommended at Discharge Frequent or constant Supervision/Assistance  ?Patient can return home with the following Two people to help with walking and/or transfers;Two people to help with bathing/dressing/bathroom;Help with stairs or ramp for entrance;Assist for transportation;Assistance with cooking/housework ?  ?Equipment Recommendations ? Other (comment) (TBD at next venue of care)  ?   ?Recommendations for Other Services   ? ? ?  ?Precautions / Restrictions Precautions ?Precautions: Posterior Hip ?Precaution Booklet Issued: Yes (comment) ?Restrictions ?Weight Bearing Restrictions: Yes ?LLE Weight Bearing: Weight bearing as tolerated  ?  ? ?Mobility ? Bed Mobility ?Overal bed mobility: Needs Assistance ?Bed Mobility: Supine to Sit, Sit to Supine ?  ?  ?Supine to sit: Mod assist ?  ?  ?General bed mobility comments: Mod A to come to full upright sitting for BLE and trunk control ?  ? ?Transfers ?Overall transfer level: Needs assistance ?Equipment used: Rolling walker (2 wheels) ?Transfers: Sit to/from Stand ?Sit to Stand: Mod assist, From elevated surface ?  ?  ?  ?  ?  ?General transfer comment: Mod verbal and tactile cues for sequencing ?  ? ?Ambulation/Gait ?Ambulation/Gait assistance: Min assist ?Gait Distance (Feet): 2 Feet ?Assistive device: Rolling walker (2 wheels) ?Gait Pattern/deviations: Step-to pattern, Antalgic, Decreased stance time - left ?Gait velocity: decreased ?  ?  ?General Gait Details: Mod verbal cues for step-to sequencing for pain control ? ? ?Stairs ?  ?  ?  ?  ?  ? ? ?Wheelchair Mobility ?  ? ?Modified Rankin (Stroke Patients Only) ?  ? ? ?  ?Balance Overall balance assessment: Needs assistance ?  ?Sitting balance-Leahy Scale: Fair ?  ?Postural control: Right lateral lean ?Standing balance support: Bilateral upper extremity supported, During functional activity, Reliant on assistive device for balance ?Standing balance-Leahy Scale: Fair ?Standing balance comment: Reliant on AD, decreased WB'ing on LLE ?  ?  ?  ?  ?  ?  ?  ?  ?  ?  ?  ?  ? ?  ?Cognition Arousal/Alertness: Awake/alert ?Behavior During Therapy: [facility] for tasks  assessed/performed ?Overall Cognitive Status: History of cognitive impairments - at baseline ?  ?  ?  ?  ?  ?  ?  ?  ?  ?  ?  ?  ?  ?  ?  ?  ?  ?  ?  ? ?  ?Exercises Total Joint Exercises ?Ankle Circles/Pumps: AROM, 10 reps, Strengthening ?Quad Sets:  Strengthening, Both, 10 reps ?Heel Slides: AAROM, Strengthening, Left, 5 reps ?Long Arc Quad: AROM, Strengthening, Both, 10 reps ?Knee Flexion: AROM, Strengthening, Both, 10 reps ?Other Exercises ?Other Exercises: Posterior hip precaution education with pt and family ?Other Exercises: HEP education with pt and family for BLE APs, QS, and LAQs ? ?  ?General Comments   ?  ?  ? ?Pertinent Vitals/Pain Pain Assessment ?Pain Assessment: Faces ?Faces Pain Scale: Hurts a little bit ?Pain Location: L hip with movement only ?Pain Descriptors / Indicators: Discomfort, Grimacing, Guarding ?Pain Intervention(s): Repositioned, Premedicated before session, Monitored during session  ? ? ?Home Living   ?  ?  ?  ?  ?  ?  ?  ?  ?  ?   ?  ?Prior Function    ?  ?  ?   ? ?PT Goals (current goals can now be found in the care plan section) Progress towards PT goals: Progressing toward goals ? ?  ?Frequency ? ? ? 7X/week ? ? ? ?  ?PT Plan Current plan remains appropriate  ? ? ?Co-evaluation   ?  ?  ?  ?  ? ?  ?AM-PAC PT "6 Clicks" Mobility   ?Outcome Measure ? Help needed turning from your back to your side while in a flat bed without using bedrails?: A Lot ?Help needed moving from lying on your back to sitting on the side of a flat bed without using bedrails?: A Lot ?Help needed moving to and from a bed to a chair (including a wheelchair)?: A Lot ?Help needed standing up from a chair using your arms (e.g., wheelchair or bedside chair)?: A Lot ?Help needed to walk in hospital room?: Total ?Help needed climbing 3-5 steps with a railing? : Total ?6 Click Score: 10 ? ?  ?End of Session Equipment Utilized During Treatment: Gait belt ?Activity Tolerance: Patient limited by pain ?Patient left: in chair;with call bell/phone within reach;with nursing/sitter in room;with family/visitor present;with SCD's reapplied;Other (comment) (Pt left with nsg preparing for discharge) ?Nurse Communication: Mobility status;Weight bearing status;Precautions ?PT  Visit Diagnosis: Unsteadiness on feet (R26.81);Muscle weakness (generalized) (M62.81);History of falling (Z91.81);Other abnormalities of gait and mobility (R26.89);Difficulty in walking, not elsewhere classified (R26.2);Pain ?Pain - Right/Left: Left ?Pain - part of body: Hip ?  ? ? ?Time: 0630-1601 ?PT Time Calculation (min) (ACUTE ONLY): 28 min ? ?Charges:  $Therapeutic Exercise: 8-22 mins ?$Therapeutic Activity: 8-22 mins          ?          ? ?D. Royetta Asal PT, DPT ?08/18/21, 10:57 AM ? ? ?

## 2021-08-22 ENCOUNTER — Emergency Department: Payer: Medicare Other

## 2021-08-22 ENCOUNTER — Emergency Department
Admission: EM | Admit: 2021-08-22 | Discharge: 2021-08-22 | Disposition: A | Discharge status: Home / Self Care | Payer: Medicare Other | Attending: Emergency Medicine | Admitting: Emergency Medicine

## 2021-08-22 DIAGNOSIS — R0789 Other chest pain: Secondary | ICD-10-CM

## 2021-08-22 DIAGNOSIS — M25552 Pain in left hip: Secondary | ICD-10-CM | POA: Diagnosis not present

## 2021-08-22 DIAGNOSIS — W19XXXA Unspecified fall, initial encounter: Secondary | ICD-10-CM

## 2021-08-22 DIAGNOSIS — S7002XA Contusion of left hip, initial encounter: Secondary | ICD-10-CM | POA: Insufficient documentation

## 2021-08-22 DIAGNOSIS — S79912A Unspecified injury of left hip, initial encounter: Secondary | ICD-10-CM | POA: Diagnosis present

## 2021-08-22 DIAGNOSIS — F0390 Unspecified dementia without behavioral disturbance: Secondary | ICD-10-CM

## 2021-08-22 DIAGNOSIS — W06XXXA Fall from bed, initial encounter: Secondary | ICD-10-CM | POA: Insufficient documentation

## 2021-08-22 DIAGNOSIS — Z8673 Personal history of transient ischemic attack (TIA), and cerebral infarction without residual deficits: Secondary | ICD-10-CM | POA: Insufficient documentation

## 2021-08-22 DIAGNOSIS — I10 Essential (primary) hypertension: Secondary | ICD-10-CM | POA: Insufficient documentation

## 2021-08-22 DIAGNOSIS — Z7982 Long term (current) use of aspirin: Secondary | ICD-10-CM | POA: Insufficient documentation

## 2021-08-22 LAB — CBC WITH DIFFERENTIAL/PLATELET
Abs Immature Granulocytes: 0.52 10*3/uL — ABNORMAL HIGH (ref 0.00–0.07)
Basophils Absolute: 0.1 10*3/uL (ref 0.0–0.1)
Basophils Relative: 1 %
Eosinophils Absolute: 0.1 10*3/uL (ref 0.0–0.5)
Eosinophils Relative: 0 %
HCT: 29.7 % — ABNORMAL LOW (ref 36.0–46.0)
Hemoglobin: 9.7 g/dL — ABNORMAL LOW (ref 12.0–15.0)
Immature Granulocytes: 4 %
Lymphocytes Relative: 8 %
Lymphs Abs: 1 10*3/uL (ref 0.7–4.0)
MCH: 29.5 pg (ref 26.0–34.0)
MCHC: 32.7 g/dL (ref 30.0–36.0)
MCV: 90.3 fL (ref 80.0–100.0)
Monocytes Absolute: 1.1 10*3/uL — ABNORMAL HIGH (ref 0.1–1.0)
Monocytes Relative: 8 %
Neutro Abs: 10.6 10*3/uL — ABNORMAL HIGH (ref 1.7–7.7)
Neutrophils Relative %: 79 %
Platelets: 524 10*3/uL — ABNORMAL HIGH (ref 150–400)
RBC: 3.29 MIL/uL — ABNORMAL LOW (ref 3.87–5.11)
RDW: 14.6 % (ref 11.5–15.5)
WBC: 13.4 10*3/uL — ABNORMAL HIGH (ref 4.0–10.5)
nRBC: 0 % (ref 0.0–0.2)

## 2021-08-22 LAB — TROPONIN I (HIGH SENSITIVITY)
Troponin I (High Sensitivity): 11 ng/L (ref <18)
Troponin I (High Sensitivity): 10 ng/L (ref <18)

## 2021-08-22 LAB — BASIC METABOLIC PANEL
Anion gap: 7 (ref 5–15)
BUN: 13 mg/dL (ref 8–23)
CO2: 26 mmol/L (ref 22–32)
Calcium: 8.7 mg/dL — ABNORMAL LOW (ref 8.9–10.3)
Chloride: 97 mmol/L — ABNORMAL LOW (ref 98–111)
Creatinine, Ser: 0.61 mg/dL (ref 0.44–1.00)
GFR, Estimated: >60 mL/min (ref >60)
Glucose, Bld: 115 mg/dL — ABNORMAL HIGH (ref 70–99)
Potassium: 3.6 mmol/L (ref 3.5–5.1)
Sodium: 130 mmol/L — ABNORMAL LOW (ref 135–145)

## 2021-08-22 MED ORDER — RISPERIDONE 0.5 MG PO TABS: 0.5000 mg | ORAL_TABLET | Freq: Every day | ORAL | 0 refills | Status: AC

## 2021-08-22 MED ORDER — HYDROCODONE-ACETAMINOPHEN 5-325 MG PO TABS
1.0000 | ORAL_TABLET | Freq: Once | ORAL | Status: AC
  Administered 2021-08-22: 1 via ORAL
  Filled 2021-08-22: qty 1

## 2021-08-22 MED ORDER — OXYCODONE-ACETAMINOPHEN 5-325 MG PO TABS
1.0000 | ORAL_TABLET | Freq: Once | ORAL | Status: AC
  Administered 2021-08-22: 1 via ORAL
  Filled 2021-08-22: qty 1

## 2021-08-22 NOTE — ED Triage Notes (Signed)
88 y/o female arrived to the [facility] via EMS coming from [facility] due to a fall. Pt is a resident there due to rehab for a recent hip replacement. This morning pt had an unwitnessed fall. Pt is complaining of left hip pain. EMS states pt has a suspected left hip dislocation due to slight inward rotation and shortening on the left leg. Pt is A&OX4 ?

## 2021-08-22 NOTE — ED Notes (Signed)
Discharge paperwork provided to daughter, [name]. [name] is going to take paperwork to [facility] now to get prescription filled.  ?

## 2021-08-22 NOTE — ED Notes (Signed)
Patient to radiology.

## 2021-08-22 NOTE — ED Provider Notes (Signed)
? ?[facility] ?Provider Note ? ? ? Event Date/Time  ? First MD Initiated Contact with Patient 08/22/21 [phone]   ?  (approximate) ? ? ?History  ? ?Chief Complaint ?Fall and Hip Pain ? ? ?HPI ? ?Barbara Vega is a 88 y.o. female with past medical history of hypertension, stroke, hyponatremia, and dementia who presents to the ED following fall.  History is limited due to patient's baseline dementia and majority of history is obtained from EMS.  Staff at [facility] had told EMS that patient had unwitnessed fall and was found down on the ground beside her bed.  She complains of pain in her left hip along with pain in her chest.  She believes these pains that started after she fell, however she is not sure.  She denies hitting her head and does not think she lost consciousness, does not take any blood thinners beyond a baby aspirin.  Patient at her baseline mental status per EMS. ?  ? ? ?Physical Exam  ? ?Triage Vital Signs: ?ED Triage Vitals  ?Enc Vitals Group  ?   BP   ?   Pulse   ?   Resp   ?   Temp   ?   Temp src   ?   SpO2   ?   Weight   ?   Height   ?   Head Circumference   ?   Peak Flow   ?   Pain Score   ?   Pain Loc   ?   Pain Edu?   ?   Excl. in [place]?   ? ? ?Most recent vital signs: ?Vitals:  ? 08/22/21 0625 08/22/21 0634  ?BP:    ?Pulse:  81  ?Resp:  (!) 22  ?Temp: 98.7 ?F (37.1 ?C)   ?SpO2:  95%  ? ? ?Constitutional: Awake and alert. ?Eyes: Conjunctivae are normal. ?Head: Atraumatic. ?Nose: No congestion/rhinnorhea. ?Mouth/Throat: Mucous membranes are moist.  ?Neck: No midline cervical spine tenderness to palpation. ?Cardiovascular: Normal rate, regular rhythm. Grossly normal heart sounds.  2+ radial and DP pulses bilaterally. ?Respiratory: Normal respiratory effort.  No retractions. Lungs CTAB.  Anterior chest wall diffusely tender to palpation. ?Gastrointestinal: Soft and nontender. No distention. ?Musculoskeletal: Left hip tenderness to palpation with associated ecchymosis and mild  edema, surgical site clean without erythema, edema, warmth, or drainage.  No upper extremity bony tenderness to palpation. ?Neurologic:  Normal speech and language. No gross focal neurologic deficits are appreciated. ? ? ? ?ED Results / Procedures / Treatments  ? ?Labs ?(all labs ordered are listed, but only abnormal results are displayed) ?Labs Reviewed  ?CBC WITH DIFFERENTIAL/PLATELET - Abnormal; Notable for the following components:  ?    Result Value  ? WBC 13.4 (*)   ? RBC 3.29 (*)   ? Hemoglobin 9.7 (*)   ? HCT 29.7 (*)   ? Platelets 524 (*)   ? Neutro Abs 10.6 (*)   ? Monocytes Absolute 1.1 (*)   ? Abs Immature Granulocytes 0.52 (*)   ? All other components within normal limits  ?BASIC METABOLIC PANEL  ?TROPONIN I (HIGH SENSITIVITY)  ? ? ? ?EKG ? ?ED ECG REPORT ?Tempie Hoist, the attending physician, personally viewed and interpreted this ECG. ? ? Date: 08/22/2021 ? EKG Time: 6:28 ? Rate: 80 ? Rhythm: normal sinus rhythm ? Axis: RAD ? Intervals:none ? ST&T Change: None ? ?RADIOLOGY ?CT head reviewed by me with no hemorrhage or midline shift. ? ?  PROCEDURES: ? ?Critical Care performed: No ? ?Procedures ? ? ?MEDICATIONS ORDERED IN ED: ?Medications  ?oxyCODONE-acetaminophen (PERCOCET/ROXICET) 5-325 MG per tablet 1 tablet (1 tablet Oral Given 08/22/21 0648)  ? ? ? ?IMPRESSION / MDM / ASSESSMENT AND PLAN / ED COURSE  ?I reviewed the triage vital signs and the nursing notes. ?             ?               ? ?88 y.o. female with past medical history of hypertension, stroke, hyponatremia, and dementia who presents to the ED for unwitnessed fall at her nursing facility, now complaining of chest pain and left hip pain. ? ?Differential diagnosis includes, but is not limited to, rib fracture, hemothorax, pneumothorax, hip fracture, hip dislocation, intracranial injury, cervical spine injury. ? ?Patient nontoxic-appearing and in no acute distress, vital signs are unremarkable and she appears at her baseline mental  status.  There was concern that patient injured her hip from EMS, however there is no obvious deformity and she is neurovascularly intact to her left lower extremity.  We will further assess with x-ray to ensure appropriate positioning of her recently surgically repaired hip.  Patient does complain of chest pain, which is reproducible with palpation of her chest.  Given it is unclear whether this pain started as a result of the fall or occurred prior to it, we will check EKG, CBC, BMP, and troponin.  Given unclear history of fall, we will also check CT head and cervical spine. ? ?CT head and cervical spine are negative for acute process.  Patient turned over to oncoming provider pending x-ray of chest and left hip along with labs for patient's chest pain.  If these are unremarkable, she would be appropriate for discharge back to nursing facility. ? ?  ? ? ?FINAL CLINICAL IMPRESSION(S) / ED DIAGNOSES  ? ?Final diagnoses:  ?Fall, initial encounter  ?Chest wall pain  ? ? ? ?Rx / DC Orders  ? ?ED Discharge Orders   ? ? None  ? ?  ? ? ? ?Note:  This document was prepared using Dragon voice recognition software and may include unintentional dictation errors. ?  ?Blake Divine, MD ?08/22/21 [phone] ? ?

## 2021-08-22 NOTE — ED Provider Notes (Signed)
Procedures ? ?  ? ?----------------------------------------- ?10:49 AM on 08/22/2021 ?----------------------------------------- ?Labs and imaging all unremarkable.  Patient remained stable in the ED.  Results and ongoing care discussed with her daughter at bedside who notes that the patient has been experiencing severe symptoms of dementia which have progressed substantially over the last several months.  I initiated a goals of care discussion which she will continue with her PCP and her rehab care team. ? ? ?  ?Carrie Mew, MD ?08/22/21 1049 ? ?

## 2021-08-22 NOTE — ED Notes (Signed)
Daughter arrived at patient bedside, requesting update from MD. Secure chat sent to MD to make him aware.  ?

## 2021-08-22 NOTE — ED Notes (Signed)
Secretary to arrange transport back to [facility]. ?

## 2021-10-02 ENCOUNTER — Emergency Department
Admission: EM | Admit: 2021-10-02 | Discharge: 2021-10-06 | Disposition: A | Discharge status: Skilled Nursing Facility | Payer: Medicare Other | Attending: Emergency Medicine | Admitting: Emergency Medicine

## 2021-10-02 ENCOUNTER — Emergency Department: Payer: Medicare Other

## 2021-10-02 ENCOUNTER — Other Ambulatory Visit: Payer: Self-pay

## 2021-10-02 DIAGNOSIS — R531 Weakness: Secondary | ICD-10-CM | POA: Insufficient documentation

## 2021-10-02 DIAGNOSIS — Z9181 History of falling: Secondary | ICD-10-CM | POA: Diagnosis not present

## 2021-10-02 DIAGNOSIS — R0902 Hypoxemia: Secondary | ICD-10-CM | POA: Diagnosis not present

## 2021-10-02 DIAGNOSIS — I10 Essential (primary) hypertension: Secondary | ICD-10-CM | POA: Diagnosis not present

## 2021-10-02 DIAGNOSIS — F0390 Unspecified dementia without behavioral disturbance: Secondary | ICD-10-CM | POA: Diagnosis not present

## 2021-10-02 DIAGNOSIS — Z20822 Contact with and (suspected) exposure to covid-19: Secondary | ICD-10-CM | POA: Diagnosis not present

## 2021-10-02 DIAGNOSIS — R2681 Unsteadiness on feet: Secondary | ICD-10-CM | POA: Diagnosis not present

## 2021-10-02 LAB — COMPREHENSIVE METABOLIC PANEL
ALT: 5 U/L (ref 0–44)
AST: 15 U/L (ref 15–41)
Albumin: 3.5 g/dL (ref 3.5–5.0)
Alkaline Phosphatase: 48 U/L (ref 38–126)
Anion gap: 5 (ref 5–15)
BUN: 14 mg/dL (ref 8–23)
CO2: 26 mmol/L (ref 22–32)
Calcium: 8.8 mg/dL — ABNORMAL LOW (ref 8.9–10.3)
Chloride: 100 mmol/L (ref 98–111)
Creatinine, Ser: 0.57 mg/dL (ref 0.44–1.00)
GFR, Estimated: >60 mL/min (ref >60)
Glucose, Bld: 99 mg/dL (ref 70–99)
Potassium: 3.6 mmol/L (ref 3.5–5.1)
Sodium: 131 mmol/L — ABNORMAL LOW (ref 135–145)
Total Bilirubin: 0.5 mg/dL (ref 0.3–1.2)
Total Protein: 5.8 g/dL — ABNORMAL LOW (ref 6.5–8.1)

## 2021-10-02 LAB — URINALYSIS, COMPLETE (UACMP) WITH MICROSCOPIC
Bacteria, UA: NONE SEEN
Bilirubin Urine: NEGATIVE
Glucose, UA: NEGATIVE mg/dL
Ketones, ur: 20 mg/dL — AB
Leukocytes,Ua: NEGATIVE
Nitrite: NEGATIVE
Protein, ur: NEGATIVE mg/dL
Specific Gravity, Urine: 1.017 (ref 1.005–1.030)
Squamous Epithelial / HPF: NONE SEEN (ref 0–5)
pH: 6 (ref 5.0–8.0)

## 2021-10-02 LAB — CBC
HCT: 36.2 % (ref 36.0–46.0)
Hemoglobin: 11.8 g/dL — ABNORMAL LOW (ref 12.0–15.0)
MCH: 30.3 pg (ref 26.0–34.0)
MCHC: 32.6 g/dL (ref 30.0–36.0)
MCV: 92.8 fL (ref 80.0–100.0)
Platelets: 288 10*3/uL (ref 150–400)
RBC: 3.9 MIL/uL (ref 3.87–5.11)
RDW: 13.4 % (ref 11.5–15.5)
WBC: 6.4 10*3/uL (ref 4.0–10.5)
nRBC: 0 % (ref 0.0–0.2)

## 2021-10-02 LAB — LACTIC ACID, PLASMA: Lactic Acid, Venous: 1.2 mmol/L (ref 0.5–1.9)

## 2021-10-02 NOTE — ED Notes (Signed)
Pt is very hard of hearing, per pt's family at bedside, pt has been weak, PT came to house and pt had low pox. Per family pt has a history of low sodium in past.

## 2021-10-02 NOTE — ED Triage Notes (Signed)
Pt to ED with daughter from home, family member states she was at home and oxygen was reading low (she was not with pt at the time and does not know how low it was) and that it came up to 88% with a few deep breaths. SPO2 is 95% on RA here.   Pt has no complaints. No pain. Pt is HOH. Hx dementia.   Daughter states would like to talk to doctor about options for placement or possibly just more help at home or palliative care referral. Daughter states has been doing 24 hour care and is exhausted.

## 2021-10-02 NOTE — ED Provider Notes (Signed)
[facility] Provider Note    Event Date/Time   First MD Initiated Contact with Patient 10/02/21 1945     (approximate)   History   Low oxygen (95% RA)   HPI  Barbara Vega is a 88 y.o. female  who, per pcp note dated 10 days ago has history of HTN, dementia, who presents to the emergency department today because of concern for low oxygen levels.  Patient has history of dementia so history is obtained from family at bedside.  They state that over the past 5 days they have noticed pretty significant decline in the patient's physical and mental capabilities.  She has somewhat recently returned home from rehab after a hip fracture.  Over the past few days however she has had increased weakness.  When physical therapy came to work with her today they checked her oxygen level and noted it to be low.  Family has not appreciated any shortness of breath or cough.  No fevers or chills.  Physical Exam   Triage Vital Signs: ED Triage Vitals  Enc Vitals Group     BP 10/02/21 1651 (!) 145/70     Pulse Rate 10/02/21 1651 74     Resp 10/02/21 1651 18     Temp 10/02/21 1651 98.8 F (37.1 C)     Temp Source 10/02/21 1651 Oral     SpO2 10/02/21 1651 94 %     Weight 10/02/21 1702 127 lb (57.6 kg)     Height 10/02/21 1702 5' (1.524 m)     Head Circumference --      Peak Flow --      Pain Score 10/02/21 1701 0   Most recent vital signs: Vitals:   10/02/21 1904 10/02/21 1930  BP: 121/74 131/64  Pulse: 66 67  Resp:    Temp:    SpO2: 98% 96%   General: Awake, alert, not oriented. CV:  Good peripheral perfusion. Regular rate and rhythm. Resp:  Normal effort. Lungs clear. Abd:  No distention.  MSK:  Slight tenderness to left hip.   ED Results / Procedures / Treatments   Labs (all labs ordered are listed, but only abnormal results are displayed) Labs Reviewed  COMPREHENSIVE METABOLIC PANEL - Abnormal; Notable for the following components:      Result Value    Sodium 131 (*)    Calcium 8.8 (*)    Total Protein 5.8 (*)    All other components within normal limits  CBC - Abnormal; Notable for the following components:   Hemoglobin 11.8 (*)    All other components within normal limits  LACTIC ACID, PLASMA  URINALYSIS, COMPLETE (UACMP) WITH MICROSCOPIC  LACTIC ACID, PLASMA  CBG MONITORING, ED     EKG  I, Nance Pear, attending physician, personally viewed and interpreted this EKG  EKG Time: 1651 Rate: 74 Rhythm: normal sinus rhythm Axis: normal Intervals: qtc 404 QRS: narrow ST changes: no st elevation Impression: normal ekg  RADIOLOGY I independently interpreted and visualized the CT head. My interpretation: No bleed Radiology interpretation:    IMPRESSION:  1. No acute intracranial pathology.  2. Age-related atrophy and chronic microvascular ischemic changes.       I independently interpreted and visualized the CXR. My interpretation: No pneumonia. Radiology interpretation:  IMPRESSION:  Left basilar atelectasis.    I independently interpreted and visualized the Left hip. My interpretation: No acute abnormality Radiology interpretation:  IMPRESSION:  No acute abnormality.      PROCEDURES:  Critical Care performed: No  Procedures   MEDICATIONS ORDERED IN ED: Medications - No data to display   IMPRESSION / MDM / [place] / ED COURSE  I reviewed the triage vital signs and the nursing notes.                              Differential diagnosis includes, but is not limited to, urinary tract fashion, pneumonia, anemia, electrolyte abnormality, intracranial process.  Patient presents to the emergency department today because of concerns for low oxygen level at her residence.  Patient's oxygen level normal here in the emergency department.  Patient not complaining of any shortness of breath.  Chest x-ray without any concerning abnormalities.  At this point do wonder if it was erroneous oxygen reading  at home.  However family does state the patient has had recent mental and physical decline.  Blood work without any concerning leukocytosis or electrolyte abnormality.  Urine without findings concerning for infection.  Given family's concern for care at home will have social work and physical therapy evaluate in the morning.   FINAL CLINICAL IMPRESSION(S) / ED DIAGNOSES   Final diagnoses:  Weakness   Note:  This document was prepared using Dragon voice recognition software and may include unintentional dictation errors.    Nance Pear, MD 10/03/21 [phone]

## 2021-10-02 NOTE — ED Provider Triage Note (Signed)
Emergency Medicine Provider Triage Evaluation Note  Barbara Vega, a 88 y.o. female  was evaluated in triage.  Pt complains of AMS.  She presents with her daughter who cares for her at home last 24-hour home health nurse.  Patient has had increased malaise, fatigue, and declining activity over the last week.  She also apparently had a low oxygen reading during her home PT session today for a total hip replacement.  Patient with dementia at baseline, has no complaints.  No reports of fever, chills,, vomiting, dizziness.  There is concern from the primary provider that the patient may have a UTI.  UA collected today with pending urine culture per the daughter.  Review of Systems  Positive: AMS Negative: NVD  Physical Exam  BP (!) 145/70   Pulse 74   Temp 98.8 F (37.1 C) (Oral)   Resp 18   Ht 5' (1.524 m)   Wt 57.6 kg   SpO2 94%   BMI 24.80 kg/m  Gen:   Awake, no distress  decreased activity Resp:  Normal effort CTA MSK:   Moves extremities without difficulty  Other:  RRR  Medical Decision Making  Medically screening exam initiated at 5:10 PM.  Appropriate orders placed.  Marlow Hendrie Spires was informed that the remainder of the evaluation will be completed by another provider, this initial triage assessment does not replace that evaluation, and the importance of remaining in the ED until their evaluation is complete.  Geriatric patient to the ED for evaluation of AMS, with concern for possible UTI.  Patient is status post total hip replacement and is about 1 week out from a 3-week SNF placement.   Melvenia Needles, PA-C 10/02/21 1712

## 2021-10-02 NOTE — ED Notes (Signed)
Pt resting, family updated on care progression.

## 2021-10-02 NOTE — ED Notes (Signed)
Pt in ct, family requesting purewick when pt returns.

## 2021-10-03 LAB — RESP PANEL BY RT-PCR (FLU A&B, COVID) ARPGX2
Influenza A by PCR: NEGATIVE
Influenza B by PCR: NEGATIVE
SARS Coronavirus 2 by RT PCR: NEGATIVE

## 2021-10-03 MED ORDER — POLYETHYLENE GLYCOL 3350 17 G PO PACK: 17.0000 g | PACK | Freq: Every day | ORAL | Status: DC | PRN

## 2021-10-03 MED ORDER — METHOCARBAMOL 500 MG PO TABS
500.0000 mg | ORAL_TABLET | Freq: Three times a day (TID) | ORAL | Status: DC | PRN
  Filled 2021-10-03: qty 1

## 2021-10-03 MED ORDER — RISPERIDONE 1 MG PO TABS
0.5000 mg | ORAL_TABLET | Freq: Every day | ORAL | Status: DC
Start: 2021-10-03 — End: 2021-10-06
  Administered 2021-10-03 – 2021-10-05 (×4): 0.5 mg via ORAL
  Filled 2021-10-03 (×4): qty 1

## 2021-10-03 MED ORDER — ASPIRIN 81 MG PO TBEC
81.0000 mg | DELAYED_RELEASE_TABLET | Freq: Every day | ORAL | Status: DC
Start: 2021-10-03 — End: 2021-10-06
  Administered 2021-10-03 – 2021-10-06 (×4): 81 mg via ORAL
  Filled 2021-10-03 (×4): qty 1

## 2021-10-03 MED ORDER — ACETAMINOPHEN 325 MG PO TABS
325.0000 mg | ORAL_TABLET | Freq: Four times a day (QID) | ORAL | Status: DC | PRN
  Administered 2021-10-04 – 2021-10-05 (×2): 650 mg via ORAL
  Administered 2021-10-05: 325 mg via ORAL
  Filled 2021-10-03 (×2): qty 2
  Filled 2021-10-03: qty 1

## 2021-10-03 MED ORDER — DIVALPROEX SODIUM 250 MG PO DR TAB
250.0000 mg | DELAYED_RELEASE_TABLET | Freq: Every day | ORAL | Status: DC
  Administered 2021-10-03 – 2021-10-05 (×4): 250 mg via ORAL
  Filled 2021-10-03 (×4): qty 1

## 2021-10-03 MED ORDER — SENNA 8.6 MG PO TABS
1.0000 | ORAL_TABLET | Freq: Two times a day (BID) | ORAL | Status: DC
  Administered 2021-10-03 – 2021-10-06 (×5): 8.6 mg via ORAL
  Filled 2021-10-03 (×7): qty 1

## 2021-10-03 MED ORDER — DIVALPROEX SODIUM 250 MG PO DR TAB
375.0000 mg | DELAYED_RELEASE_TABLET | Freq: Every day | ORAL | Status: DC
  Administered 2021-10-03 – 2021-10-06 (×4): 375 mg via ORAL
  Filled 2021-10-03 (×4): qty 1

## 2021-10-03 MED ORDER — ATORVASTATIN CALCIUM 20 MG PO TABS
20.0000 mg | ORAL_TABLET | Freq: Every day | ORAL | Status: DC
  Administered 2021-10-04 – 2021-10-05 (×2): 20 mg via ORAL
  Filled 2021-10-03 (×3): qty 1

## 2021-10-03 MED ORDER — BISACODYL 10 MG RE SUPP: 10.0000 mg | Freq: Every day | RECTAL | Status: DC | PRN

## 2021-10-03 MED ORDER — MELATONIN 5 MG PO TABS
5.0000 mg | ORAL_TABLET | Freq: Every evening | ORAL | Status: DC | PRN
  Administered 2021-10-03 – 2021-10-05 (×4): 5 mg via ORAL
  Filled 2021-10-03 (×4): qty 1

## 2021-10-03 MED ORDER — FE FUMARATE-B12-VIT C-FA-IFC PO CAPS
1.0000 | ORAL_CAPSULE | Freq: Two times a day (BID) | ORAL | Status: DC
  Administered 2021-10-04 – 2021-10-06 (×5): 1 via ORAL
  Filled 2021-10-03 (×8): qty 1

## 2021-10-03 MED ORDER — DOCUSATE SODIUM 100 MG PO CAPS
100.0000 mg | ORAL_CAPSULE | Freq: Two times a day (BID) | ORAL | Status: DC
  Administered 2021-10-03 – 2021-10-06 (×5): 100 mg via ORAL
  Filled 2021-10-03 (×7): qty 1

## 2021-10-03 MED ORDER — DONEPEZIL HCL 5 MG PO TABS
10.0000 mg | ORAL_TABLET | Freq: Every day | ORAL | Status: DC
  Administered 2021-10-04 – 2021-10-06 (×3): 10 mg via ORAL
  Filled 2021-10-03 (×4): qty 2

## 2021-10-03 MED ORDER — PANTOPRAZOLE SODIUM 20 MG PO TBEC
20.0000 mg | DELAYED_RELEASE_TABLET | Freq: Every day | ORAL | Status: DC
Start: 2021-10-03 — End: 2021-10-06
  Administered 2021-10-04 – 2021-10-06 (×3): 20 mg via ORAL
  Filled 2021-10-03 (×4): qty 1

## 2021-10-03 MED ORDER — ALUM & MAG HYDROXIDE-SIMETH 200-200-20 MG/5ML PO SUSP: 30.0000 mL | ORAL | Status: DC | PRN

## 2021-10-03 NOTE — NC FL2 (Addendum)
[place] LEVEL OF CARE SCREENING TOOL     IDENTIFICATION  Patient Name: Barbara Vega Birthdate: 03/17/1934 Sex: female Admission Date (Current Location): 10/02/2021  [facility] [place] Number:  Engineering geologist and Address:  [facility], [address] [place], [place] [address]      Provider Number: [id number]  Attending Physician Name and Address:  No att. providers found  Relative Name and Phone Number:  Marchia Bond (Daughter)   [phone]    Current Level of Care: Hospital Recommended Level of Care: [place] Prior Approval Number:    Date Approved/Denied:   PASRR Number: [id number]  Discharge Plan: SNF    Current Diagnoses: Patient Active Problem List   Diagnosis Date Noted   Hip fracture ([place]) 08/11/2021   Generalized abdominal pain    HLD (hyperlipidemia) 06/01/2021   Stroke ([place]) 06/01/2021   Neck pain 06/01/2021   Acute CVA (cerebrovascular accident) ([place]) 03/26/2021   Paresthesia 03/26/2021   Alzheimer's dementia without behavioral disturbance ([place])    Hiatal hernia with GERD 10/26/2020   Generalized weakness 06/15/2019   Hypokalemia 06/14/2019   Pain due to onychomycosis of toenails of both feet 06/07/2019   S/P kyphoplasty 06/01/2019   DDD (degenerative disc disease), lumbar 06/16/2017   SI (sacroiliac) joint dysfunction 06/16/2017   Incarcerated left inguinal hernia    Hyponatremia 11/20/2016   GERD (gastroesophageal reflux disease) 09/20/2016   Dysphagia, unspecified 08/09/2016   Papilloma of breast 04/03/2015   Osteoporosis, post-menopausal 03/04/2015   Cannot sleep 03/04/2015   Essential hypertension 03/04/2015   Adenomatous colon polyp 03/04/2015    Orientation RESPIRATION BLADDER Height & Weight     Self  Normal Incontinent Weight: 127 lb (57.6 kg) Height:  5' (152.4 cm)  BEHAVIORAL SYMPTOMS/MOOD NEUROLOGICAL BOWEL NUTRITION STATUS     (Dementia) Continent Diet (Room  service appropriate)  AMBULATORY STATUS COMMUNICATION OF NEEDS Skin   Limited Assist Verbally Normal                       Personal Care Assistance Level of Assistance  Total care       Total Care Assistance: Limited assistance   Functional Limitations Info  Hearing, Speech, Sight Sight Info: Adequate Hearing Info: Impaired (HOH) Speech Info: Adequate    SPECIAL CARE FACTORS FREQUENCY  PT (By licensed PT)     PT Frequency: 5 x week              Contractures Contractures Info: Not present    Additional Factors Info  Code Status, Allergies Code Status Info: DNR Allergies Info: Codone (hydrocodone), Codeine           Current Medications (10/03/2021):  This is the current hospital active medication list Current Facility-Administered Medications  Medication Dose Route Frequency Provider Last Rate Last Admin   acetaminophen (TYLENOL) tablet 325-650 mg  325-650 mg Oral Q6H PRN Nance Pear, MD       alum & mag hydroxide-simeth (MAALOX/MYLANTA) 200-200-20 MG/5ML suspension 30 mL  30 mL Oral Q4H PRN Nance Pear, MD       aspirin EC tablet 81 mg  81 mg Oral Daily Nance Pear, MD   81 mg at 10/03/21 0933   atorvastatin (LIPITOR) tablet 20 mg  20 mg Oral Daily Nance Pear, MD       bisacodyl (DULCOLAX) suppository 10 mg  10 mg Rectal Daily PRN Nance Pear, MD       divalproex (DEPAKOTE) DR  tablet 250 mg  250 mg Oral QHS Nance Pear, MD   250 mg at 10/03/21 0015   divalproex (DEPAKOTE) DR tablet 375 mg  375 mg Oral Daily Nance Pear, MD   375 mg at 10/03/21 7253   docusate sodium (COLACE) capsule 100 mg  100 mg Oral BID Nance Pear, MD       donepezil (ARICEPT) tablet 10 mg  10 mg Oral Daily Nance Pear, MD       ferrous [id number] C-folic acid (TRINSICON / FOLTRIN) capsule 1 capsule  1 capsule Oral BID PC Nance Pear, MD       melatonin tablet 5 mg  5 mg Oral QHS PRN Nance Pear, MD   5 mg at 10/03/21 0015    methocarbamol (ROBAXIN) tablet 500 mg  500 mg Oral Q8H PRN Nance Pear, MD       pantoprazole (PROTONIX) EC tablet 20 mg  20 mg Oral Daily Nance Pear, MD       polyethylene glycol (MIRALAX / GLYCOLAX) packet 17 g  17 g Oral Daily PRN Nance Pear, MD       risperiDONE (RISPERDAL) tablet 0.5 mg  0.5 mg Oral QHS Nance Pear, MD   0.5 mg at 10/03/21 0014   senna (SENOKOT) tablet 8.6 mg  1 tablet Oral BID Nance Pear, MD   8.6 mg at 10/03/21 0015   Current Outpatient Medications  Medication Sig Dispense Refill   aspirin 81 MG EC tablet Take 81 mg by mouth daily.     divalproex (DEPAKOTE) 500 MG DR tablet Take 500 mg by mouth 2 (two) times daily.     acetaminophen (TYLENOL) 325 MG tablet Take 1-2 tablets (325-650 mg total) by mouth every 6 (six) hours as needed for mild pain (pain score 1-3 or temp > 100.5).     alum & mag hydroxide-simeth (MAALOX/MYLANTA) 200-200-20 MG/5ML suspension Take 30 mLs by mouth every 4 (four) hours as needed for indigestion. 355 mL 0   atorvastatin (LIPITOR) 20 MG tablet Take 1 tablet by mouth daily.     bisacodyl (DULCOLAX) 10 MG suppository Place 1 suppository (10 mg total) rectally daily as needed for moderate constipation. 12 suppository 0   docusate sodium (COLACE) 100 MG capsule Take 1 capsule (100 mg total) by mouth 2 (two) times daily. 10 capsule 0   donepezil (ARICEPT) 10 MG tablet Take 10 mg by mouth daily. (Patient not taking: Reported on 10/03/2021)     ferrous [id number] C-folic acid (TRINSICON / FOLTRIN) capsule Take 1 capsule by mouth 2 (two) times daily after a meal. (Patient not taking: Reported on 10/03/2021)     HYDROcodone-acetaminophen (NORCO/VICODIN) 5-325 MG tablet Take 1-2 tablets by mouth every 4 (four) hours as needed for moderate pain (pain score 4-6). 30 tablet 0   melatonin 5 MG TABS Take 1 tablet (5 mg total) by mouth at bedtime as needed.  0   methocarbamol (ROBAXIN) 500 MG tablet Take 1 tablet (500 mg total) by  mouth every 8 (eight) hours as needed for muscle spasms.     pantoprazole (PROTONIX) 20 MG tablet Take 1 tablet (20 mg total) by mouth daily. (Patient not taking: Reported on 10/03/2021)     polyethylene glycol (MIRALAX / GLYCOLAX) 17 g packet Take 17 g by mouth daily as needed for mild constipation. 14 each 0   risperiDONE (RISPERDAL) 0.5 MG tablet Take 1 tablet (0.5 mg total) by mouth at bedtime. 30 tablet 0   senna (SENOKOT) 8.6 MG TABS tablet  Take 1 tablet (8.6 mg total) by mouth 2 (two) times daily. 120 tablet 0     Discharge Medications: Please see discharge summary for a list of discharge medications.  Relevant Imaging Results:  Relevant Lab Results:   Additional Information SNN: [id number]  Daryll Brod Barney Russomanno, LCSWA

## 2021-10-03 NOTE — ED Notes (Signed)
This RN to bedside, pt continues to sit on the toilet. Pt's daughter remains at bedside attempting to have a BM. This RN instructed patient's daughter to use call bell when patient done and this RN would return and assist patient to clean up and get back to bed.

## 2021-10-03 NOTE — ED Notes (Signed)
Pt moved into a hospital bed for comfort. Pt has removed pure wick and urinated in bed, cleansed, purewick replaced. Call bell at right side, pt provided with po fluids. Bed in low and locked position, fall alarm in place.

## 2021-10-03 NOTE — ED Notes (Signed)
Pt attempting to get out of bed repeatedly. Pt reoriented by this rn to place and time. Pt states she is looking for her family. Pt informed her family went home.

## 2021-10-03 NOTE — ED Notes (Signed)
Pt assisted up to commode  with family at side. Informed family Rn will return as soon as possible to help back to bed, primary rn notified pt on toilet, family is at side.

## 2021-10-03 NOTE — ED Notes (Signed)
Report to beth[place], rn.

## 2021-10-03 NOTE — Evaluation (Signed)
Physical Therapy Evaluation Patient Details Name: Barbara Vega MRN: [id number] DOB: Sep 18, 1933 Today's Date: 10/03/2021  History of Present Illness  Pt is an 88 y/o F who presented to the ED because of concerns of low O2 levels. PMH: HTN, dementia, CVA, L hemiarthroplasty on 08/12/21  Clinical Impression  Pt seen for PT evaluation with pt agreeable. Pt is only oriented x 2 so unsure of accuracy of PLOF & home set up information but pt reports she was living alone, ambulating without AD, but endorses many falls "day & night".  On this date, pt requires min<>Mod assist for bed mobility with use of hospital bed features, mod assist for STS & min assist with heavy cuing for ambulation with RW. Due to impaired cognition, in addition to impaired balance, strength, & gait, pt is a high fall risk & unsafe to d/c home alone. Pt would benefit from STR upon d/c to maximize independence with functional mobility & reduce fall risk prior to return home.     Recommendations for follow up therapy are one component of a multi-disciplinary discharge planning process, led by the attending physician.  Recommendations may be updated based on patient status, additional functional criteria and insurance authorization.  Follow Up Recommendations Skilled nursing-short term rehab (<3 hours/day)    Assistance Recommended at Discharge Frequent or constant Supervision/Assistance  Patient can return home with the following  A little help with walking and/or transfers;A little help with bathing/dressing/bathroom;Assistance with cooking/housework;Direct supervision/assist for financial management;Assist for transportation;Direct supervision/assist for medications management;Help with stairs or ramp for entrance    Equipment Recommendations None recommended by PT  Recommendations for Other Services  OT consult    Functional Status Assessment Patient has had a recent decline in their functional status and demonstrates the  ability to make significant improvements in function in a reasonable and predictable amount of time.     Precautions / Restrictions Precautions Precautions: Fall;Posterior Hip Restrictions Weight Bearing Restrictions: Yes LLE Weight Bearing: Weight bearing as tolerated      Mobility  Bed Mobility Overal bed mobility: Needs Assistance Bed Mobility: Supine to Sit, Sit to Supine     Supine to sit: Min assist, HOB elevated (requires cuing to use bed rails & assistance to move LLE to EOB.) Sit to supine: Mod assist (use of bed rails, when instructed to lie down pt lays trunk/head down on pillow, keeping BLE on floor & requires mod assist to elevate BLE to transfer to supine from sitting)   General bed mobility comments: Pt is able to come to sitting with min assist with use of hospital bed features but requires max assist to scoot to sitting EOB & place BLE feet flat on floor. Pt able to transfer to [facility] with bed in trendelenburg position & use of bed rails.    Transfers Overall transfer level: Needs assistance Equipment used: Rolling walker (2 wheels) Transfers: Sit to/from Stand Sit to Stand: Mod assist           General transfer comment: cuing for safe hand placement when using RW during STS    Ambulation/Gait Ambulation/Gait assistance: Min assist Gait Distance (Feet): 12 Feet Assistive device: Rolling walker (2 wheels) Gait Pattern/deviations: Step-to pattern, Decreased step length - right, Decreased step length - left, Decreased weight shift to right, Decreased stride length, Decreased dorsiflexion - right, Decreased dorsiflexion - left, Decreased stance time - right Gait velocity: decreased     General Gait Details: Pt ambulates slightly rotated to R, stepping with L foot  first, barely step to pattern with R not fully meeting LLE. Max cuing & poor return demo to ambulate within base of AD.  Stairs            Wheelchair Mobility    Modified Rankin (Stroke  Patients Only)       Balance Overall balance assessment: Needs assistance Sitting-balance support: Feet supported, Bilateral upper extremity supported Sitting balance-Leahy Scale: Fair Sitting balance - Comments: close supervision static sitting   Standing balance support: Bilateral upper extremity supported, During functional activity, Reliant on assistive device for balance Standing balance-Leahy Scale: Poor                               Pertinent Vitals/Pain Pain Assessment Pain Assessment: Faces Faces Pain Scale: Hurts little more Pain Location: LLE with movement Pain Descriptors / Indicators: Grimacing, Discomfort Pain Intervention(s): Monitored during session, Repositioned    Home Living Family/patient expects to be discharged to:: Private residence Living Arrangements: Alone   Type of Home: House Home Access: Stairs to enter Entrance Stairs-Rails: Right;Left;Can reach both Entrance Stairs-Number of Steps: 2   Home Layout: One level Home Equipment: Conservation officer, nature (2 wheels);Cane - single point Additional Comments: Unsure if home set up & PLOF is accurate as no family present to [employer] pt provides.    Prior Function               Mobility Comments: Pt reports she was living alone & ambulating without AD but falling "day & night"       Hand Dominance        Extremity/Trunk Assessment   Upper Extremity Assessment Upper Extremity Assessment: Generalized weakness    Lower Extremity Assessment Lower Extremity Assessment: Generalized weakness       Communication   Communication: HOH  Cognition Arousal/Alertness: Awake/alert Behavior During Therapy: WFL for tasks assessed/performed Overall Cognitive Status: No family/caregiver present to determine baseline cognitive functioning                                 General Comments: Pt is oriented to self & location only. Follows simple commands with extra time.         General Comments      Exercises     Assessment/Plan    PT Assessment Patient needs continued PT services  PT Problem List Decreased strength;Pain;Decreased cognition;Decreased activity tolerance;Decreased knowledge of use of DME;Decreased balance;Decreased safety awareness;Decreased mobility;Decreased knowledge of precautions       PT Treatment Interventions Therapeutic exercise;Balance training;Gait training;DME instruction;Manual techniques;Stair training;Neuromuscular re-education;Functional mobility training;Cognitive remediation;Therapeutic activities;Patient/family education;Modalities    PT Goals (Current goals can be found in the Care Plan section)  Acute Rehab PT Goals Patient Stated Goal: none stated PT Goal Formulation: With patient Time For Goal Achievement: 10/17/21 Potential to Achieve Goals: Fair    Frequency Min 2X/week     Co-evaluation               AM-PAC PT "6 Clicks" Mobility  Outcome Measure Help needed turning from your back to your side while in a flat bed without using bedrails?: A Little Help needed moving from lying on your back to sitting on the side of a flat bed without using bedrails?: A Lot Help needed moving to and from a bed to a chair (including a wheelchair)?: A Lot Help needed standing up from a chair using  your arms (e.g., wheelchair or bedside chair)?: A Lot Help needed to walk in hospital room?: A Little Help needed climbing 3-5 steps with a railing? : A Lot 6 Click Score: 14    End of Session   Activity Tolerance: Patient tolerated treatment well Patient left: in bed;with call bell/phone within reach;with bed alarm set   PT Visit Diagnosis: Unsteadiness on feet (R26.81);Muscle weakness (generalized) (M62.81);Difficulty in walking, not elsewhere classified (R26.2);History of falling (Z91.81)    Time: 9906-8934 PT Time Calculation (min) (ACUTE ONLY): 16 min   Charges:   PT Evaluation $PT Eval Moderate Complexity: [place], PT, DPT 10/03/21, 9:55 AM   Waunita Schooner 10/03/2021, 9:53 AM

## 2021-10-03 NOTE — ED Notes (Signed)
Pt assisted back to bed, placed in clean brief and clean gown. Pt with noted small amounts of stool with assistance. VS obtained by this RN. Pt tolerated well. Pt's daughter remains at bedside.

## 2021-10-03 NOTE — ED Notes (Signed)
Patient given breakfast tray. Pt resting comfortably at this time.

## 2021-10-03 NOTE — ED Notes (Signed)
Pt continues to rest in bed with eyes closed, respirations even and unlabored, lights remain dimmed for patient comfort. Bed alarm remains on while patient in hospital bed for comfort, call bell within reach of patient at this time.

## 2021-10-03 NOTE — ED Notes (Signed)
Pt cleansed of incontinent urine. Repositioned in bed for comfort. Call bell at right side, bed in low and locked position. All siderails up, fall alarm in place.

## 2021-10-03 NOTE — ED Notes (Signed)
Pt sleeping. Resps unlabored.

## 2021-10-03 NOTE — ED Notes (Signed)
Pt cleansed of incontinent urine in bed, new sheets provided.

## 2021-10-03 NOTE — ED Notes (Signed)
Night time medications given at this time per daughters request. Pt assisted to position of comfort at this time. Pt denies further needs. Fall alarm in place at this time, call bell within reach. Pt denies further needs, lights dimmed for comfort. Pt's daughter left for the night, requesting Amber Busick be contacted for further needs tonight.

## 2021-10-03 NOTE — TOC Progression Note (Signed)
Transition of Care [facility]) - Progression Note    Patient Details  Name: Barbara Vega MRN: [id number] Date of Birth: 12-Mar-1934  Transition of Care [facility]) CM/SW [place], [place] Phone Number: 10/03/2021, 1:06 PM  Clinical Narrative:     PT recommended the patient for SNF. CSW started work up for bed search.         Expected Discharge Plan and Services                                                 Social Determinants of Health (SDOH) Interventions    Readmission Risk Interventions     View : No data to display.

## 2021-10-03 NOTE — ED Notes (Signed)
Patient up to bedside commode with this RN's assistance. Pt urinated and assisted back into bed. Pt in NAD at this time.

## 2021-10-03 NOTE — Evaluation (Signed)
Occupational Therapy Evaluation Patient Details Name: Barbara Vega MRN: [id number] DOB: May 14, 1934 Today's Date: 10/03/2021   History of Present Illness Pt is an 88 year old female who presented to the ED due to low oxygen levels. PMH significant for HTN, dementia.   Clinical Impression   Chart reviewed, pt greeted in room with daughter present. Pt is alert, oriented to self and place only. Pt follows one step directions with increased time. Pt daughter reports increased assistance required for all ADL/mobility following discharge from rehab a few weeks ago and reports pt has poor sleep cycles, often staying awake at night. 24/7 supervision was required. Pt was minimally ambulatory per daughter report however was amb at rehab. Pt presents with deficits in strength, endurance, activity tolerance. Recommend discharge to SNF. OT will follow acutely.      Recommendations for follow up therapy are one component of a multi-disciplinary discharge planning process, led by the attending physician.  Recommendations may be updated based on patient status, additional functional criteria and insurance authorization.   Follow Up Recommendations  Skilled nursing-short term rehab (<3 hours/day)    Assistance Recommended at Discharge Frequent or constant Supervision/Assistance  Patient can return home with the following A little help with walking and/or transfers;A little help with bathing/dressing/bathroom;Assistance with cooking/housework;Direct supervision/assist for financial management;Assist for transportation;Assistance with feeding;Direct supervision/assist for medications management;Help with stairs or ramp for entrance    Functional Status Assessment  Patient has had a recent decline in their functional status and demonstrates the ability to make significant improvements in function in a reasonable and predictable amount of time.  Equipment Recommendations  None recommended by OT;Other (comment)  (pt has recommended equipment)    Recommendations for Other Services       Precautions / Restrictions Precautions Precautions: Fall;Posterior Hip Restrictions Weight Bearing Restrictions: Yes LLE Weight Bearing: Weight bearing as tolerated      Mobility Bed Mobility Overal bed mobility: Needs Assistance Bed Mobility: Supine to Sit, Sit to Supine     Supine to sit: Min assist, HOB elevated (step by step vcs) Sit to supine: Mod assist, HOB elevated        Transfers Overall transfer level: Needs assistance Equipment used: Rolling walker (2 wheels) Transfers: Sit to/from Stand Sit to Stand: Mod assist           General transfer comment: step by step vcs      Balance Overall balance assessment: Needs assistance Sitting-balance support: Feet supported, Bilateral upper extremity supported Sitting balance-Leahy Scale: Fair     Standing balance support: Bilateral upper extremity supported, During functional activity, Reliant on assistive device for balance Standing balance-Leahy Scale: Poor                             ADL either performed or assessed with clinical judgement   ADL Overall ADL's : Needs assistance/impaired Eating/Feeding: Set up;Cueing for sequencing;Sitting   Grooming: Wash/dry face;Sitting;Cueing for sequencing;Set up       Lower Body Bathing: Maximal assistance;Sit to/from stand       Lower Body Dressing: Moderate assistance;Sitting/lateral leans   Toilet Transfer: Minimal assistance;Rolling walker (2 wheels) Toilet Transfer Details (indicate cue type and reason): simulated Toileting- Clothing Manipulation and Hygiene: Maximal assistance;Sit to/from stand Toileting - Clothing Manipulation Details (indicate cue type and reason): peri care       General ADL Comments: frequent vcs for participation     Vision Patient Visual Report: No change from  baseline       Perception     Praxis      Pertinent Vitals/Pain Pain  Assessment Pain Assessment: No/denies pain     Hand Dominance     Extremity/Trunk Assessment Upper Extremity Assessment Upper Extremity Assessment: Generalized weakness   Lower Extremity Assessment Lower Extremity Assessment: Generalized weakness   Cervical / Trunk Assessment Cervical / Trunk Assessment: Kyphotic   Communication Communication Communication: HOH   Cognition Arousal/Alertness: Awake/alert Behavior During Therapy: Flat affect Overall Cognitive Status: History of cognitive impairments - at baseline                                 General Comments: Pt is alert, oriented to self, place, not oriented to date or situation; Pt daugther reports cognitive function has continued to decline and pt is not sleeping     General Comments  vss throughout    Exercises     Shoulder Instructions      Home Living Family/patient expects to be discharged to:: Private residence Living Arrangements: Alone Available Help at Discharge: Family (family has set up 24/7 assist following discharge from rehab) Type of Home: House Home Access: Stairs to enter [employer] of Steps: 2 Entrance Stairs-Rails: Right;Left;Can reach both Home Layout: One level     Bathroom Shower/Tub: Teacher, early years/pre: Standard     Home Equipment: Conservation officer, nature (2 wheels);Cane - single point;BSC/3in1;Shower seat   Additional Comments: Unsure if home set up & PLOF is accurate as no family present to [employer] pt provides.      Prior Functioning/Environment Prior Level of Function : Needs assist  Cognitive Assist : ADLs (cognitive)   ADLs (Cognitive): Set up cues Physical Assist : ADLs (physical)   ADLs (physical): Grooming;Bathing;Dressing;Toileting;IADLs Mobility Comments: minimally amb with RW per daugther report, increased assist for all functional transfers ADLs Comments: Pt requires assist for all ADL/IADL following discharge from rehab         OT Problem List: Decreased strength;Impaired balance (sitting and/or standing);Decreased activity tolerance      OT Treatment/Interventions: Self-care/ADL training;Therapeutic activities;Patient/family education    OT Goals(Current goals can be found in the care plan section) Acute Rehab OT Goals Patient Stated Goal: get stronger OT Goal Formulation: With patient/family Time For Goal Achievement: 10/17/21 Potential to Achieve Goals: Fair  OT Frequency: Min 2X/week    Co-evaluation              AM-PAC OT "6 Clicks" Daily Activity     Outcome Measure Help from another person eating meals?: A Little Help from another person taking care of personal grooming?: A Little Help from another person toileting, which includes using toliet, bedpan, or urinal?: A Lot Help from another person bathing (including washing, rinsing, drying)?: A Lot Help from another person to put on and taking off regular upper body clothing?: A Lot Help from another person to put on and taking off regular lower body clothing?: A Lot 6 Click Score: 14   End of Session Equipment Utilized During Treatment: Rolling walker (2 wheels) Nurse Communication: Mobility status  Activity Tolerance: Patient tolerated treatment well Patient left: in bed;with call bell/phone within reach;with bed alarm set;with family/visitor present  OT Visit Diagnosis: Unsteadiness on feet (R26.81);Muscle weakness (generalized) (M62.81);History of falling (Z91.81)                Time: 8588-5027 OT Time Calculation (min): 19 min Charges:  OT General Charges $OT Visit: 1 Visit OT Evaluation $OT Eval Low Complexity: 1 Low Shanon Payor, OTD OTR/L  10/03/21, 12:59 PM

## 2021-10-03 NOTE — ED Notes (Addendum)
This RN to bedside, introduced self to patient and daughter who is at bedside at this time. Pt c/o intermittent spasms to L thigh from previous hip fx. Pt visualized in NAD, resting in bed eating food brought by her daughter.   Pt's daughter requesting that if any needs arise tonight that we will Amber Busick listed in emergency contacts.

## 2021-10-03 NOTE — ED Notes (Signed)
Patient helped to the restroom at this time with tech after pt stated they had to poop, pt did not poop

## 2021-10-04 NOTE — ED Notes (Signed)
Patient assisted to restroom with walker and this RN. Patient assisted back into bed. Bed locked and lowered with bed alarm on. Dinner tray at bedside.

## 2021-10-04 NOTE — ED Notes (Signed)
Patient assisted to restroom with walker.

## 2021-10-04 NOTE — ED Notes (Signed)
Patient's lunch tray at bedside. Patient readjusted in bed as requested. No other needs expressed to RN. Bed locked and lowered with bed alarm on.

## 2021-10-04 NOTE — ED Notes (Signed)
Assisted pt to toilet w/ walker

## 2021-10-04 NOTE — ED Notes (Signed)
Pt noted to be awake, resting in bed, confused, consistent with baseline dx of dementia. Pt denies any needs, calm, cooperative, and pleasant on assessment. Denies any needs. Call bell remains within reach of patient. Fall alarm remains in place at this time.

## 2021-10-04 NOTE — ED Provider Notes (Signed)
-----------------------------------------   6:11 AM on 10/04/2021 -----------------------------------------   Blood pressure (!) 164/89, pulse 87, temperature 97.9 F (36.6 C), temperature source Oral, resp. rate 18, height 5' (1.524 m), weight 57.6 kg, SpO2 96 %.  The patient is calm and cooperative at this time.  There have been no acute events since the last update.  Awaiting disposition plan from Social Work team.   Paulette Blanch, MD 10/04/21 [phone]

## 2021-10-04 NOTE — ED Notes (Signed)
Patient ambulatory with walker to restroom. Patient had BM without difficulties.

## 2021-10-04 NOTE — ED Notes (Signed)
Patient resting in bed with eyes closed. Resp even, unlabored on RA. Appears sleeping. No distress noted at this time.

## 2021-10-04 NOTE — TOC Progression Note (Addendum)
Transition of Care [facility]) - Progression Note    Patient Details  Name: Barbara Vega MRN: [id number] Date of Birth: June 06, 1933  Transition [facility]) CM/SW [place], [place] Phone Number: 10/04/2021, 3:39 PM  Clinical Narrative:     CSW spoke to patient's daughter Barbara Vega [phone]. Barbara Vega said they prefer [facility] (patient has been there before) or [facility].  Bed search started.       Expected Discharge Plan and Services                                                 Social Determinants of Health (SDOH) Interventions    Readmission Risk Interventions     View : No data to display.

## 2021-10-04 NOTE — ED Notes (Signed)
Pt assisted from toilet to bed and new brief applied. Bed rails up x3 and bed alarm reinitiated.

## 2021-10-04 NOTE — ED Notes (Signed)
Breakfast tray placed at bedside. Patient resting comfortably in hospital bed. Bed lowered and locked with bed alarm on. No needs expressed to RN at this time.

## 2021-10-04 NOTE — ED Notes (Signed)
Pts son left his cell phone in pts room, he called and asked for [place] to put the cell phone in the pts personal belongings bag. He will come by in the AM to pick it up.

## 2021-10-04 NOTE — ED Notes (Signed)
Pt at edge of bed with alarm going off.  This RN assisted pt to toilet and back one assist w/walker. Pt reminded to push call button if needing to get out of bed.

## 2021-10-04 NOTE — ED Notes (Signed)
Pt assisted to the bathroom by this RN. Pt able to urinate, clean brief applied by this RN. Pt back to bed without incident. Bed alarm placed back on by this RN. Pt denies further needs, call bell within reach of patient at this time.

## 2021-10-04 NOTE — ED Notes (Signed)
Patient assisted to restroom with assistance on walker and RN. Family at bedside with patient. No needs expressed at this time.

## 2021-10-04 NOTE — ED Notes (Signed)
Daughter at bedside with patient. Patient resting comfortably in bed. NAD noted. Bed lowered and locked with bed alarm on.

## 2021-10-04 NOTE — ED Notes (Signed)
Pt assisted to toilet and back to bed post loose BM. Pt cleaned, dried and new brief applied. Bed rails x3 up and bed alarm reinitiated.

## 2021-10-05 DIAGNOSIS — Z7189 Other specified counseling: Secondary | ICD-10-CM | POA: Diagnosis not present

## 2021-10-05 LAB — CBC WITH DIFFERENTIAL/PLATELET
Abs Immature Granulocytes: 0.02 10*3/uL (ref 0.00–0.07)
Basophils Absolute: 0 10*3/uL (ref 0.0–0.1)
Basophils Relative: 1 %
Eosinophils Absolute: 0.1 10*3/uL (ref 0.0–0.5)
Eosinophils Relative: 2 %
HCT: 39.6 % (ref 36.0–46.0)
Hemoglobin: 13.2 g/dL (ref 12.0–15.0)
Immature Granulocytes: 0 %
Lymphocytes Relative: 34 %
Lymphs Abs: 1.6 10*3/uL (ref 0.7–4.0)
MCH: 30.4 pg (ref 26.0–34.0)
MCHC: 33.3 g/dL (ref 30.0–36.0)
MCV: 91.2 fL (ref 80.0–100.0)
Monocytes Absolute: 0.4 10*3/uL (ref 0.1–1.0)
Monocytes Relative: 8 %
Neutro Abs: 2.7 10*3/uL (ref 1.7–7.7)
Neutrophils Relative %: 55 %
Platelets: 202 10*3/uL (ref 150–400)
RBC: 4.34 MIL/uL (ref 3.87–5.11)
RDW: 13.2 % (ref 11.5–15.5)
WBC: 4.8 10*3/uL (ref 4.0–10.5)
nRBC: 0 % (ref 0.0–0.2)

## 2021-10-05 LAB — LACTIC ACID, PLASMA: Lactic Acid, Venous: 1 mmol/L (ref 0.5–1.9)

## 2021-10-05 LAB — COMPREHENSIVE METABOLIC PANEL
ALT: 6 U/L (ref 0–44)
AST: 20 U/L (ref 15–41)
Albumin: 3.6 g/dL (ref 3.5–5.0)
Alkaline Phosphatase: 51 U/L (ref 38–126)
Anion gap: 10 (ref 5–15)
BUN: 9 mg/dL (ref 8–23)
CO2: 25 mmol/L (ref 22–32)
Calcium: 9.5 mg/dL (ref 8.9–10.3)
Chloride: 99 mmol/L (ref 98–111)
Creatinine, Ser: 0.53 mg/dL (ref 0.44–1.00)
GFR, Estimated: >60 mL/min (ref >60)
Glucose, Bld: 112 mg/dL — ABNORMAL HIGH (ref 70–99)
Potassium: 2.8 mmol/L — ABNORMAL LOW (ref 3.5–5.1)
Sodium: 134 mmol/L — ABNORMAL LOW (ref 135–145)
Total Bilirubin: 1.1 mg/dL (ref 0.3–1.2)
Total Protein: 6.1 g/dL — ABNORMAL LOW (ref 6.5–8.1)

## 2021-10-05 LAB — URINALYSIS, ROUTINE W REFLEX MICROSCOPIC
Bilirubin Urine: NEGATIVE
Glucose, UA: NEGATIVE mg/dL
Hgb urine dipstick: NEGATIVE
Ketones, ur: 20 mg/dL — AB
Leukocytes,Ua: NEGATIVE
Nitrite: NEGATIVE
Protein, ur: NEGATIVE mg/dL
Specific Gravity, Urine: 1.014 (ref 1.005–1.030)
pH: 6 (ref 5.0–8.0)

## 2021-10-05 MED ORDER — POTASSIUM CHLORIDE 20 MEQ PO PACK: 40.0000 meq | PACK | Freq: Two times a day (BID) | ORAL | Status: DC

## 2021-10-05 MED ORDER — ONDANSETRON HCL 4 MG/2ML IJ SOLN
4.0000 mg | Freq: Once | INTRAMUSCULAR | Status: AC
  Administered 2021-10-05: 4 mg via INTRAVENOUS
  Filled 2021-10-05: qty 2

## 2021-10-05 MED ORDER — POTASSIUM CHLORIDE 20 MEQ PO PACK
40.0000 meq | PACK | Freq: Two times a day (BID) | ORAL | Status: DC
  Administered 2021-10-05 – 2021-10-06 (×3): 40 meq via ORAL
  Filled 2021-10-05 (×3): qty 2

## 2021-10-05 MED ORDER — LACTATED RINGERS IV BOLUS
1000.0000 mL | Freq: Once | INTRAVENOUS | Status: AC
  Administered 2021-10-05: 1000 mL via INTRAVENOUS

## 2021-10-05 NOTE — ED Notes (Signed)
Pt ambulated to BR with standby assist and walker. Back to bed without difficulty. Bed alarm activated.

## 2021-10-05 NOTE — ED Notes (Signed)
Pt continues to rest quietly in bed watching TV. Family members X 2 at bedside. Bed alarm remains activated.

## 2021-10-05 NOTE — ED Provider Notes (Signed)
Today's Vitals   10/04/21 0528 10/04/21 0640 10/04/21 0814 10/04/21 1927  BP:   (!) 111/56 (!) 152/77  Pulse:   67 83  Resp:   18 16  Temp:    98.2 F (36.8 C)  TempSrc:    Oral  SpO2:   100% 96%  Weight:      Height:      PainSc: Asleep Asleep     Body mass index is 24.8 kg/m.   No acute events overnight.  Awaiting social work disposition.   Crystallynn Noorani, Delice Bison, DO 10/05/21 [phone]

## 2021-10-05 NOTE — TOC Progression Note (Signed)
Transition of Care [facility]) - Progression Note    Patient Details  Name: Barbara Vega MRN: [id number] Date of Birth: Sep 06, 1933  Transition of Care [facility]) CM/SW Contact  Shelbie Hutching, RN Phone Number: 10/05/2021, 9:33 AM  Clinical Narrative:    RNCM reached out to Tammy with [facility], she will review referral.     Expected Discharge Plan: [place] Barriers to Discharge: SNF Pending bed offer  Expected Discharge Plan and Services Expected Discharge Plan: [place]   Discharge Planning Services: CM Consult Post Acute Care Choice: [place] Living arrangements for the past 2 months: Single Family Home                 DME Arranged: N/A DME Agency: NA       HH Arranged: NA HH Agency: NA         Social Determinants of Health (SDOH) Interventions    Readmission Risk Interventions     View : No data to display.

## 2021-10-05 NOTE — ED Notes (Signed)
This RN called to bedside by PT after working with patient d/t pt reporting dizziness, nausea, lightheadedness. Per PT, episode started with ambulation and patient had a loose BM described as "greenish-black." Pt continues to feel weak, dizzy, and nauseous while back in bed. Dr. Ellender Hose notified. See new orders.

## 2021-10-05 NOTE — ED Notes (Signed)
Pt continues to rest quietly in bed with eyes closed. Will hold meds until patient awakes to facilitate rest. Bed alarm remains on.

## 2021-10-05 NOTE — ED Notes (Signed)
Pt ambulated to BR with standby assist and walker. Tolerated well. Pt settled back to bed with bed alarm activated.

## 2021-10-05 NOTE — ED Notes (Signed)
Pt noted to be in bed resting with eyes closed. Respirations even and unlabored. Bed in lowest position and locked. Bed alarm on. No S/S of distress noted.

## 2021-10-05 NOTE — Progress Notes (Signed)
Physical Therapy Treatment Patient Details Name: Barbara Vega MRN: [id number] DOB: 1933-08-31 Today's Date: 10/05/2021   History of Present Illness Pt is an 88 year old female who presented to the ED due to low oxygen levels. PMH significant for HTN, dementia.    PT Comments    Pt seen for PT tx with pt reporting need to use restroom. Pt is able to complete bed mobility with only supervision on this date but use of bed rails & HOB elevated. Pt requires CGA<>min assist for STS transfers & CGA for gait with impaired pattern as noted below. After toileting, further mobility deferred as pt c/o feeling nauseous & "light I'm going to pass out". Pt assisted back to bed & BP in RUE 168/87 mmHg MAP 109. Pt engaged in LLE strengthening exercises & left with all needs in reach.    Recommendations for follow up therapy are one component of a multi-disciplinary discharge planning process, led by the attending physician.  Recommendations may be updated based on patient status, additional functional criteria and insurance authorization.  Follow Up Recommendations  Skilled nursing-short term rehab (<3 hours/day)     Assistance Recommended at Discharge Frequent or constant Supervision/Assistance  Patient can return home with the following A little help with walking and/or transfers;A little help with bathing/dressing/bathroom;Assistance with cooking/housework;Direct supervision/assist for financial management;Assist for transportation;Direct supervision/assist for medications management;Help with stairs or ramp for entrance   Equipment Recommendations  None recommended by PT    Recommendations for Other Services       Precautions / Restrictions Precautions Precautions: Fall;Posterior Hip Restrictions Weight Bearing Restrictions: Yes LLE Weight Bearing: Weight bearing as tolerated     Mobility  Bed Mobility Overal bed mobility: Needs Assistance Bed Mobility: Supine to Sit, Sit to Supine, Rolling  (use of bed rails) Rolling: Supervision (use of bed rails & cuing not to cross BLE)   Supine to sit: Supervision, HOB elevated Sit to supine: Supervision, HOB elevated        Transfers Overall transfer level: Needs assistance Equipment used: Rolling walker (2 wheels) Transfers: Sit to/from Stand Sit to Stand: Min guard, Min assist (CGA STS from EOB, min assist STS from low toilet in room)                Ambulation/Gait Ambulation/Gait assistance: Min guard Gait Distance (Feet): 10 Feet (+ 10 ft) Assistive device: Rolling walker (2 wheels) Gait Pattern/deviations: Shuffle, Decreased step length - right, Decreased step length - left, Decreased dorsiflexion - right, Decreased dorsiflexion - left, Decreased stride length Gait velocity: decreased     General Gait Details: Pushes RW out in front of her slightly   Stairs             Wheelchair Mobility    Modified Rankin (Stroke Patients Only)       Balance Overall balance assessment: Needs assistance Sitting-balance support: Feet supported, Bilateral upper extremity supported Sitting balance-Leahy Scale: Good Sitting balance - Comments: supervision static sitting   Standing balance support: Bilateral upper extremity supported, During functional activity, Reliant on assistive device for balance Standing balance-Leahy Scale: Poor                              Cognition Arousal/Alertness: Awake/alert Behavior During Therapy: Flat affect Overall Cognitive Status: History of cognitive impairments - at baseline  General Comments: Pt oriented to self (name & birth year) but not oriented to age, oriented to location. Flat affect, impaired safety awareness & overall awareness. Pt repeatedly states "I feel nauseous" throughout session.        Exercises General Exercises - Lower Extremity Heel Slides: AROM, Strengthening, Left, 10 reps, Supine Hip  ABduction/ADduction: AROM, Strengthening, Left, 10 reps, Supine (hip abduction slides) Straight Leg Raises: AROM, Strengthening, Left, 10 reps, Supine    General Comments General comments (skin integrity, edema, etc.): Pt with continent void & BM after slight bowel incontinence in brief; pt requires total assist for peri hygiene & PT assists with donning new brief from bed level.      Pertinent Vitals/Pain Pain Assessment Pain Assessment: No/denies pain    Home Living                          Prior Function            PT Goals (current goals can now be found in the care plan section) Acute Rehab PT Goals Patient Stated Goal: none stated PT Goal Formulation: With patient Time For Goal Achievement: 10/17/21 Potential to Achieve Goals: Fair Progress towards PT goals: Progressing toward goals    Frequency    Min 2X/week      PT Plan Current plan remains appropriate    Co-evaluation              AM-PAC PT "6 Clicks" Mobility   Outcome Measure  Help needed turning from your back to your side while in a flat bed without using bedrails?: A Little Help needed moving from lying on your back to sitting on the side of a flat bed without using bedrails?: A Little Help needed moving to and from a bed to a chair (including a wheelchair)?: A Little Help needed standing up from a chair using your arms (e.g., wheelchair or bedside chair)?: A Little Help needed to walk in hospital room?: A Little Help needed climbing 3-5 steps with a railing? : A Lot 6 Click Score: 17    End of Session   Activity Tolerance:  (limited by nausea & not feeling well) Patient left: in bed;with call bell/phone within reach;with bed alarm set Nurse Communication:  (c/o nausea & not feeling well during session) PT Visit Diagnosis: Unsteadiness on feet (R26.81);Muscle weakness (generalized) (M62.81);Difficulty in walking, not elsewhere classified (R26.2);History of falling (Z91.81)      Time: 1025-8527 PT Time Calculation (min) (ACUTE ONLY): 23 min  Charges:  $Therapeutic Activity: 23-37 mins                     Barbara Vega, PT, DPT 10/05/21, 4:53 PM    Waunita Schooner 10/05/2021, 4:52 PM

## 2021-10-05 NOTE — TOC Progression Note (Signed)
Transition of Care [facility]) - Progression Note    Patient Details  Name: Barbara Vega MRN: [id number] Date of Birth: February 10, 1934  [facility]) CM/SW Contact  Shelbie Hutching, RN Phone Number: 10/05/2021, 4:51 PM  Clinical Narrative:    Insurance authorization approved, plan for discharge to [facility] tomorrow- approved from 5/23-5/25- [place] [id number].    Expected Discharge Plan: Skilled Nursing Facility Barriers to Discharge: SNF Pending bed offer  Expected Discharge Plan and Services Expected Discharge Plan: [place]   Discharge Planning Services: CM Consult Post Acute Care Choice: [place] Living arrangements for the past 2 months: Single Family Home                 DME Arranged: N/A DME Agency: NA       HH Arranged: NA HH Agency: NA         Social Determinants of Health (SDOH) Interventions    Readmission Risk Interventions     View : No data to display.

## 2021-10-05 NOTE — TOC Progression Note (Signed)
Transition of Care [facility]) - Progression Note    Patient Details  Name: Barbara Vega MRN: [id number] Date of Birth: [birth year]  [facility]) CM/SW Contact  Shelbie Hutching, RN Phone Number: 10/05/2021, 10:40 AM  Clinical Narrative:    [facility] offered a bed, bed offer accepted.  RNCM spoke with patient's daughter Cecille Rubin and updated her, insurance authorization started.     Expected Discharge Plan: Skilled Nursing Facility Barriers to Discharge: SNF Pending bed offer  Expected Discharge Plan and Services Expected Discharge Plan: [place]   Discharge Planning Services: CM Consult Post Acute Care Choice: [place] Living arrangements for the past 2 months: Single Family Home                 DME Arranged: N/A DME Agency: NA       HH Arranged: NA HH Agency: NA         Social Determinants of Health (SDOH) Interventions    Readmission Risk Interventions     View : No data to display.

## 2021-10-05 NOTE — ED Notes (Signed)
Pt continues to rest quietly in bed with eyes closed. No S/S of distress noted.

## 2021-10-05 NOTE — Consult Note (Cosign Needed Addendum)
Consultation Note Date: 10/05/2021   Patient Name: Barbara Vega  DOB: Sep 16, 1933  MRN: [id number]  Age / Sex: 88 y.o., female  PCP: [name], MD Referring Physician: Duffy Bruce, MD  Reason for Consultation: Establishing goals of care   HPI/Patient Profile: Barbara Vega is a 88 y.o. female  who, per pcp note dated 10 days ago has history of HTN, dementia, who presents to the emergency department today because of concern for low oxygen levels.  Patient has history of dementia so history is obtained from family at bedside.  They state that over the past 5 days they have noticed pretty significant decline in the patient's physical and mental capabilities.  She has somewhat recently returned home from rehab after a hip fracture.  Over the past few days however she has had increased weakness.  When physical therapy came to work with her today they checked her oxygen level and noted it to be low.  Family has not appreciated any shortness of breath or cough.  No fevers or chills.  Clinical Assessment and Goals of Care: Notes and labs reviewed. It appears goals are set but in to talk with patient to answer any questions, and offer support. Patient is resting in bed, no family at bedside. She is unable to tell me where she is or where she was prior to this admission. She states she has children, and [name] is the child who helps her make health care decisions. Discussed that I would call her. PT/OT in to bedside.   Called daughter to answer any questions and offer support, as goals are set. [name] discusses that patient is divorced. She has 2/3 children living. She tells me she is the legal HPOA for her mother, and tells me a copy was scanned in the ED.   She discusses that her mother has had significant decline since her hip fracture, and particularly over the past 3 weeks. She states her mother's QOL is in her  ability to work out in the yard which she is no longer able to do. She states she requires assistance with ADL's. She is intermittently incontinent. She tells me her appetite waxes and wanes, but they have to force her to drink enough fluids.She states her mother repeats questions.  She tells me she promised her mother she would keep her at home as long as possible. [name] tells me that she really requires 2 sitters and this is not feasible.   She worries her mother will not be able to live at home again, and her poor QOL will not improve. Discussed that if she declines, transition to comfort care with hospice could be discussed with outpatient palliative and facility.   Patient is currently set up to go to rehab with palliative to follow.    SUMMARY OF RECOMMENDATIONS   Patient is going to rehab with palliative to follow. Daughter would want to shift to hospice if decline.        Primary Diagnoses: Present on Admission: **None**   I  have reviewed the medical record, interviewed the patient and family, and examined the patient. The following aspects are pertinent.  Past Medical History:  Diagnosis Date   Adenomatous colon polyp    Anemia    hx   Arthritis    Benign breast lumps    History of colon polyps    benign   Hypertension    takes Triamterene-HCTZ daily   Insomnia    takes Restoril nightly   Macular degeneration    dry   Osteoporosis    Sinus infection    taking Ceftin daily    Social History   Socioeconomic History   Marital status: Divorced    Spouse name: Not on file   Number of children: Not on file   Years of education: Not on file   Highest education level: Not on file  Occupational History   Not on file  Tobacco Use   Smoking status: Never   Smokeless tobacco: Never  Vaping Use   Vaping Use: Never used  Substance and Sexual Activity   Alcohol use: No    Alcohol/week: 0.0 standard drinks   Drug use: No   Sexual activity: Not on file  Other Topics  Concern   Not on file  Social History Narrative   Not on file   Social Determinants of Health   Financial Resource Strain: Not on file  Food Insecurity: Not on file  Transportation Needs: Not on file  Physical Activity: Not on file  Stress: Not on file  Social Connections: Not on file   Family History  Problem Relation Age of Onset   Osteoporosis Mother    Heart failure Mother    Heart block Brother    Alcohol abuse Brother    Bladder Cancer Neg Hx    Kidney cancer Neg Hx    Scheduled Meds:  aspirin EC  81 mg Oral Daily   atorvastatin  20 mg Oral Daily   divalproex  250 mg Oral QHS   divalproex  375 mg Oral Daily   docusate sodium  100 mg Oral BID   donepezil  10 mg Oral Daily   ferrous [id number]-V03-[id number] C-folic acid  1 capsule Oral BID PC   pantoprazole  20 mg Oral Daily   risperiDONE  0.5 mg Oral QHS   senna  1 tablet Oral BID   Continuous Infusions: PRN Meds:.acetaminophen, alum & mag hydroxide-simeth, bisacodyl, melatonin, methocarbamol, polyethylene glycol Medications Prior to Admission:  Prior to Admission medications   Medication Sig Start Date End Date Taking? Authorizing Provider  aspirin 81 MG EC tablet Take 81 mg by mouth daily.   Yes [provider]  divalproex (DEPAKOTE) 500 MG DR tablet Take 500 mg by mouth 2 (two) times daily. 03/17/21  Yes [provider]  acetaminophen (TYLENOL) 325 MG tablet Take 1-2 tablets (325-650 mg total) by mouth every 6 (six) hours as needed for mild pain (pain score 1-3 or temp > 100.5). 08/18/21   Lorella Nimrod, MD  alum & mag hydroxide-simeth (MAALOX/MYLANTA) 200-200-20 MG/5ML suspension Take 30 mLs by mouth every 4 (four) hours as needed for indigestion. 08/18/21   Lorella Nimrod, MD  atorvastatin (LIPITOR) 20 MG tablet Take 1 tablet by mouth daily. 04/02/21   [provider]  bisacodyl (DULCOLAX) 10 MG suppository Place 1 suppository (10 mg total) rectally daily as needed for moderate constipation.  08/18/21   Lorella Nimrod, MD  docusate sodium (COLACE) 100 MG capsule Take 1 capsule (100 mg total) by mouth  2 (two) times daily. 08/18/21   Lorella Nimrod, MD  donepezil (ARICEPT) 10 MG tablet Take 10 mg by mouth daily. Patient not taking: Reported on 10/03/2021 04/02/21   [provider]  ferrous DXAJOINO-M76-[id number] C-folic acid (TRINSICON / FOLTRIN) capsule Take 1 capsule by mouth 2 (two) times daily after a meal. Patient not taking: Reported on 10/03/2021 08/18/21   Lorella Nimrod, MD  HYDROcodone-acetaminophen (NORCO/VICODIN) 5-325 MG tablet Take 1-2 tablets by mouth every 4 (four) hours as needed for moderate pain (pain score 4-6). 08/18/21   Lorella Nimrod, MD  melatonin 5 MG TABS Take 1 tablet (5 mg total) by mouth at bedtime as needed. 08/18/21   Lorella Nimrod, MD  methocarbamol (ROBAXIN) 500 MG tablet Take 1 tablet (500 mg total) by mouth every 8 (eight) hours as needed for muscle spasms. 08/18/21   Lorella Nimrod, MD  pantoprazole (PROTONIX) 20 MG tablet Take 1 tablet (20 mg total) by mouth daily. Patient not taking: Reported on 10/03/2021 08/18/21   Lorella Nimrod, MD  polyethylene glycol (MIRALAX / GLYCOLAX) 17 g packet Take 17 g by mouth daily as needed for mild constipation. 08/18/21   Lorella Nimrod, MD  risperiDONE (RISPERDAL) 0.5 MG tablet Take 1 tablet (0.5 mg total) by mouth at bedtime. 08/22/21 09/21/21  Carrie Mew, MD  senna (SENOKOT) 8.6 MG TABS tablet Take 1 tablet (8.6 mg total) by mouth 2 (two) times daily. 08/18/21   Lorella Nimrod, MD   Allergies  Allergen Reactions   Codone [Hydrocodone] Nausea And Vomiting   Codeine    Review of Systems  Gastrointestinal:  Positive for nausea.   Physical Exam Pulmonary:     Effort: Pulmonary effort is normal.  Neurological:     Mental Status: She is alert.    Vital Signs: BP (!) 158/83   Pulse 81   Temp 98.1 F (36.7 C) (Oral)   Resp 20   Ht 5' (1.524 m)   Wt 57.6 kg   SpO2 97%   BMI 24.80 kg/m  Pain Scale: PAINAD   Pain  Score: Asleep   SpO2: SpO2: 97 % O2 Device:SpO2: 97 % O2 Flow Rate: .   IO: Intake/output summary: No intake or output data in the 24 hours ending 10/05/21 1448  LBM:   Baseline Weight: Weight: 57.6 kg Most recent weight: Weight: 57.6 kg       Signed by: Asencion Gowda, NP   Please contact Palliative Medicine Team phone at [phone] for questions and concerns.  For individual provider: See Shea Evans

## 2021-10-05 NOTE — Progress Notes (Signed)
[facility] Liaison note:  New referral for [facility] outpatient Palliative service to follow at [facility] after discharge received from [facility].  Thank you for the opportunity to be involved in the care of this patient.  Flo Shanks BSN, RN, [place] [phone]

## 2021-10-05 NOTE — TOC Progression Note (Signed)
Transition of Care [facility]) - Progression Note    Patient Details  Name: Barbara Vega MRN: [id number] Date of Birth: [birth year]  Transition of Care [facility]) CM/SW Contact  Shelbie Hutching, RN Phone Number: 10/05/2021, 1:53 PM  Clinical Narrative:    Josem Kaufmann Still pending.  OP palliative referral given to [facility] with [facility].    Expected Discharge Plan: Skilled Nursing Facility Barriers to Discharge: SNF Pending bed offer  Expected Discharge Plan and Services Expected Discharge Plan: [place]   Discharge Planning Services: CM Consult Post Acute Care Choice: [place] Living arrangements for the past 2 months: Single Family Home                 DME Arranged: N/A DME Agency: NA       HH Arranged: NA HH Agency: NA         Social Determinants of Health (SDOH) Interventions    Readmission Risk Interventions     View : No data to display.

## 2021-10-05 NOTE — TOC Progression Note (Signed)
Transition of Care [facility]) - Progression Note    Patient Details  Name: LORAY AKARD MRN: [id number] Date of Birth: 1933-07-15  [facility]) CM/SW Contact  Shelbie Hutching, RN Phone Number: 10/05/2021, 3:42 PM  Clinical Narrative:    Insurance authorization still pending.  RNCM updated daughter, hopefully insurance Josem Kaufmann will come back and patient can be discharged tomorrow.    Expected Discharge Plan: Skilled Nursing Facility Barriers to Discharge: SNF Pending bed offer  Expected Discharge Plan and Services Expected Discharge Plan: [place]   Discharge Planning Services: CM Consult Post Acute Care Choice: [place] Living arrangements for the past 2 months: Single Family Home                 DME Arranged: N/A DME Agency: NA       HH Arranged: NA HH Agency: NA         Social Determinants of Health (SDOH) Interventions    Readmission Risk Interventions     View : No data to display.

## 2021-10-05 NOTE — ED Notes (Signed)
Pt ambulated to BR with X1 assist. Tolerated well.

## 2021-10-05 NOTE — ED Notes (Signed)
Pt continues to rest quietly in bed with eyes closed. Respirations even and unlabored. Bed alarm on.

## 2021-10-05 NOTE — ED Notes (Signed)
Patient assisted up to toilet. Continent of urine. Assisted back to bed and bed alarm reinitiated. No distress noted at this time.

## 2021-10-05 NOTE — ED Notes (Signed)
Pt remains in bed watching TV at this time. Visitor at bedside. Bed alarm remains activated.

## 2021-10-05 NOTE — ED Notes (Signed)
Pt resting quietly in bed watching TV at this time. Purewick in place. No S/S of distress noted. Call light within reach. Bed alarm on.

## 2021-10-06 MED ORDER — POTASSIUM CHLORIDE CRYS ER 20 MEQ PO TBCR: 20.0000 meq | EXTENDED_RELEASE_TABLET | Freq: Every day | ORAL | 0 refills | Status: AC

## 2021-10-06 NOTE — ED Notes (Addendum)
IV and purewick discontinued. Pt dressed and daughter Luiz Ochoa) made aware.

## 2021-10-06 NOTE — Plan of Care (Signed)
Notes reviewed. In to see patient, she is resting with eyes closed. No family at bedside at this time. Continue to recommend outpatient palliative with transition to hospice when ready.

## 2021-10-06 NOTE — ED Notes (Signed)
Pt eating breakfast. Pt clean and dry.

## 2021-10-06 NOTE — TOC Transition Note (Signed)
Transition of Care [facility]) - CM/SW Discharge Note   Patient Details  Name: Barbara Vega MRN: [id number] Date of Birth: 1934-03-26  Transition of Care [facility]) CM/SW Contact:  Shelbie Hutching, RN Phone Number: 10/06/2021, 10:01 AM   Clinical Narrative:    Insurance authorization approved by [employer] for 3 days- 5/23-5-25.  Patient will go to [facility] room 701.  ED RN will call report to [phone].  ED secretary will set up EMS transport.     Final next level of care: Skilled Nursing Facility Barriers to Discharge: Barriers Resolved   Patient Goals and CMS Choice Patient states their goals for this hospitalization and ongoing recovery are:: Short term rehab CMS Medicare.gov Compare Post Acute Care list provided to:: Patient Represenative (must comment) Choice offered to / list presented to : Adult Children  Discharge Placement              Patient chooses bed at: [facility] [place] Patient to be transferred to facility by: [facility] Name of family member notified: Cecille Rubin Patient and family notified of of transfer: 10/06/21  Discharge Plan and Services   Discharge Planning Services: CM Consult Post Acute Care Choice: [place]          DME Arranged: N/A DME Agency: NA       HH Arranged: NA HH Agency: NA        Social Determinants of Health (SDOH) Interventions     Readmission Risk Interventions     View : No data to display.

## 2021-10-06 NOTE — ED Provider Notes (Signed)
Patient accepted to skilled nursing facility.  She had repeat screening lab work sent yesterday which showed some mild hypokalemia which has been repleted.  She will go home with supplements for the next several days.  Otherwise, she has been hemodynamically stable.  Report called to the nursing facility.  Repeat UA did not show UTI.   Duffy Bruce, MD 10/06/21 1027

## 2021-10-06 NOTE — ED Notes (Signed)
Pt ambulated to room toilet with walker. EMS here to transport pt.

## 2021-10-06 NOTE — ED Notes (Signed)
Family at bedside. 

## 2021-10-06 NOTE — ED Provider Notes (Signed)
Today's Vitals   10/05/21 1530 10/05/21 1931 10/05/21 2046 10/06/21 0301  BP: 118/61  (!) 162/82   Pulse: 69  63   Resp:   18   Temp:      TempSrc:      SpO2: 96%  98%   Weight:      Height:      PainSc:  Asleep  Asleep   Body mass index is 24.8 kg/m.   No acute events overnight.  Awaiting social work disposition.   Itzae Mccurdy, Delice Bison, DO 10/06/21 [phone]

## 2021-10-06 NOTE — ED Notes (Signed)
Called [facility] for transport to [facility] [phone]

## 2021-10-06 NOTE — ED Notes (Signed)
Report called to [facility] resources, will arrange transport.

## 2021-10-10 LAB — CULTURE, BLOOD (SINGLE)
Culture: NO GROWTH
Special Requests: ADEQUATE

## 2022-02-14 DEATH — deceased

## 2022-06-03 IMAGING — CT CT HEAD W/O CM
4 series · 16 of 47 positions shown, 18 images · non-contrast
Comparison: No priors.

CLINICAL DATA: 87-year-old female with history of trauma from a
fall. Head and neck pain.



[Series 2: head wo · axial · 0.40mm/px · z∈[-84,+31]mm · 7 of 31 slices shown, 9 images]
[im 4/31  brain]
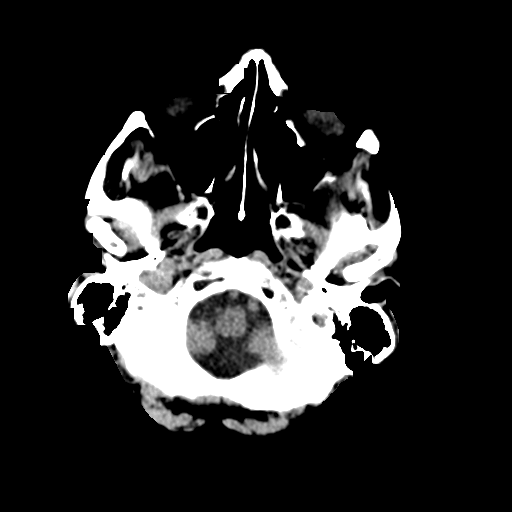
[im 4/31  bone]
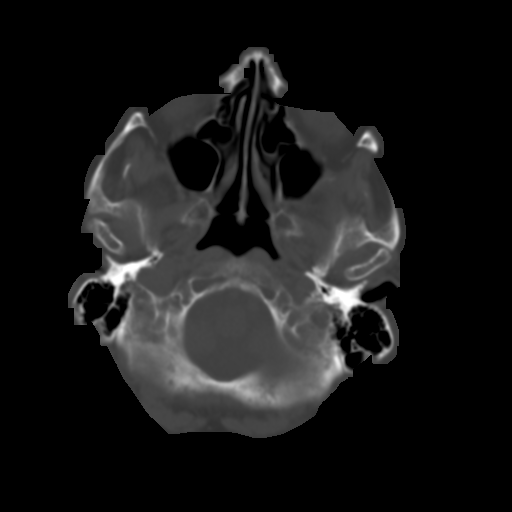
[im 8/31  brain]
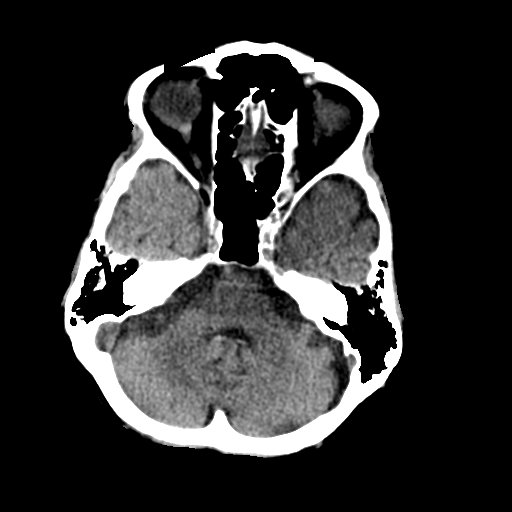
[im 12/31  brain]
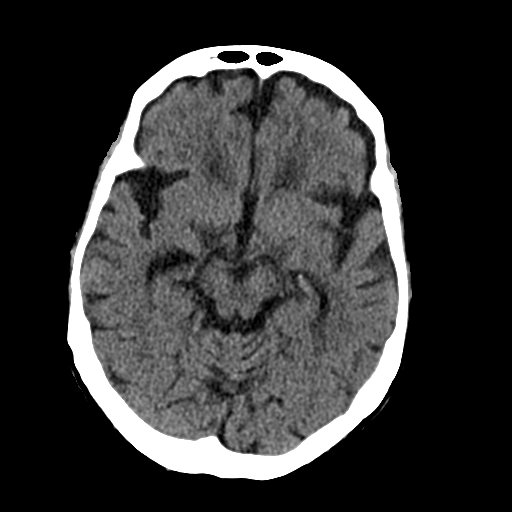
[im 16/31  brain]
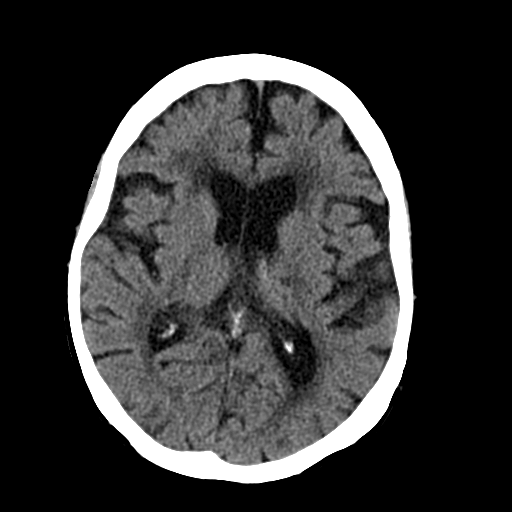
[im 19/31  brain]
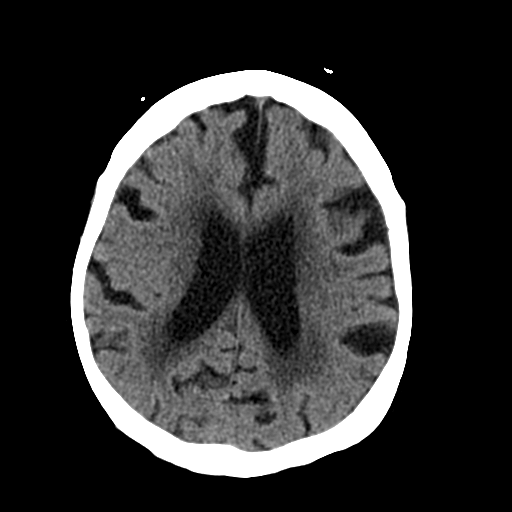
[im 19/31  bone]
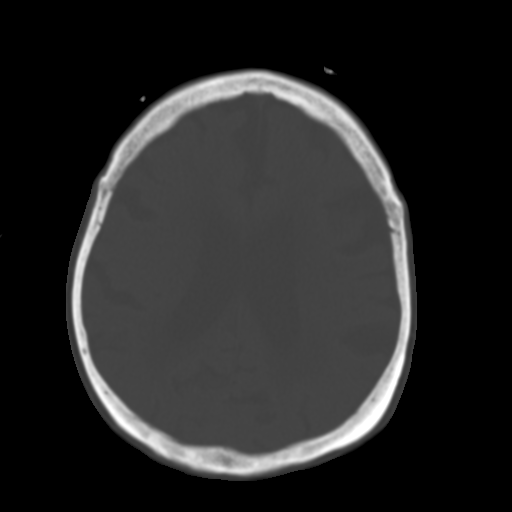
[im 23/31  brain]
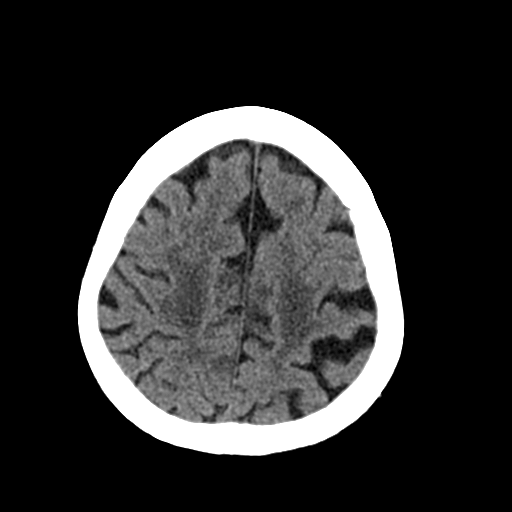
[im 27/31  brain]
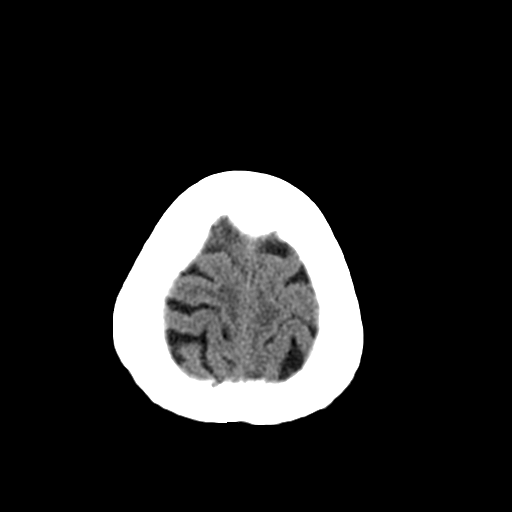

[Series 3: head bone · axial · 0.40mm/px · z∈[-85,-53]mm · 3 of 78 slices shown]
[im 8/78  bone]
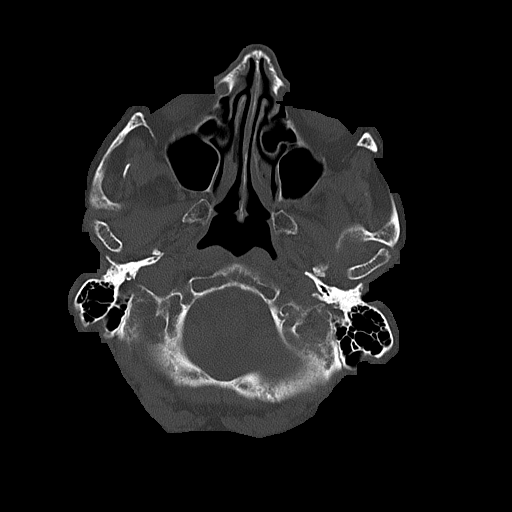
[im 16/78  bone]
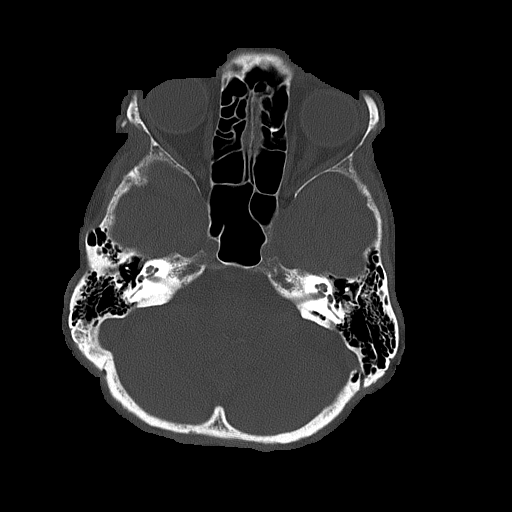
[im 24/78  bone]
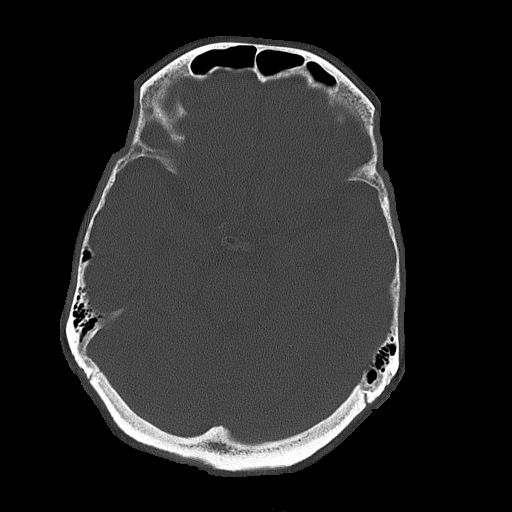

[Series 4: coronal soft tissue · coronal · 0.31mm/px · 3 of 60 slices shown]
[im 20/60  brain]
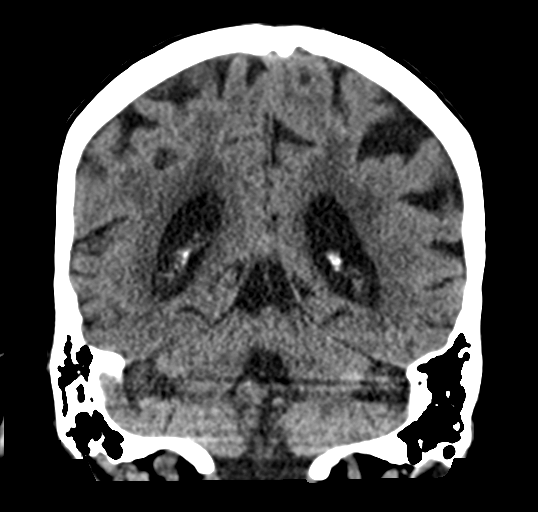
[im 27/60  brain]
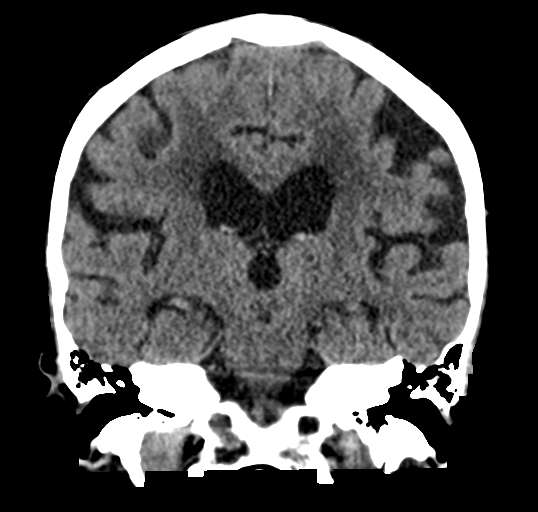
[im 33/60  brain]
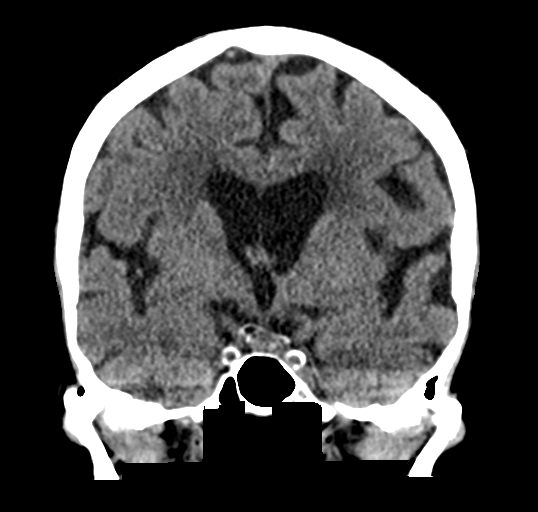

[Series 5: sagittal soft tissue · sagittal · 0.31mm/px · 3 of 54 slices shown]
[im 18/54  brain]
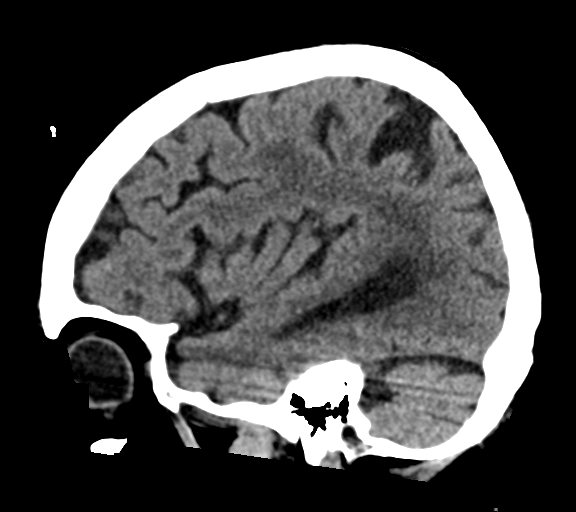
[im 27/54  brain]
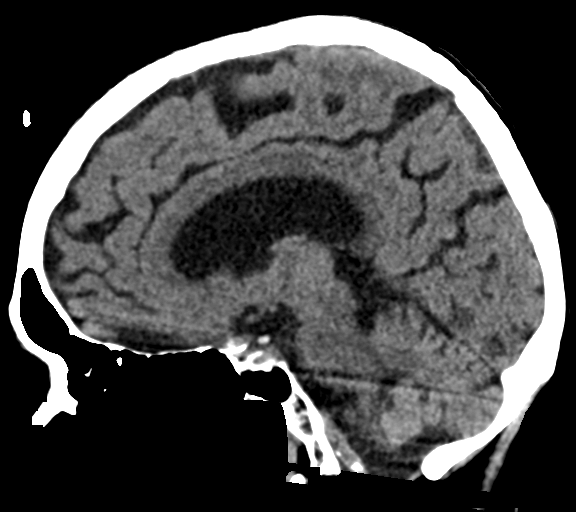
[im 36/54  brain]
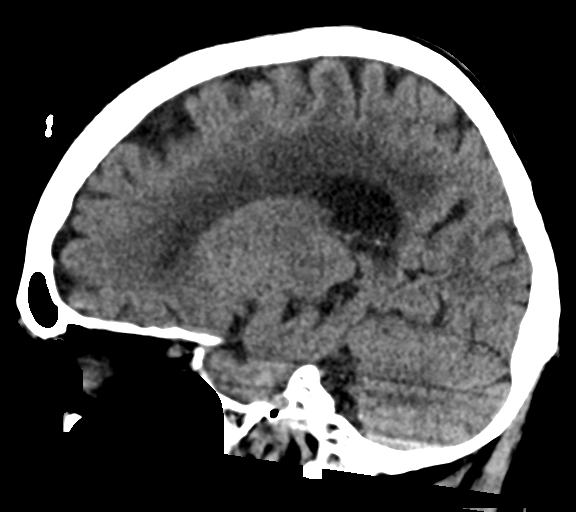

[16 of 47 positions shown; findings below may reference images not displayed]

FINDINGS: CT HEAD FINDINGS

Brain: Moderate cerebral and mild cerebellar atrophy. Patchy and
confluent areas of decreased attenuation are noted throughout the
deep and periventricular white matter of the cerebral hemispheres
bilaterally, compatible with chronic microvascular ischemic disease.
No evidence of acute infarction, hemorrhage, hydrocephalus,
extra-axial collection or mass lesion/mass effect.

Vascular: No hyperdense vessel or unexpected calcification.

Skull: Normal. Negative for fracture or focal lesion.

Sinuses/Orbits: No acute finding.

Other: None.

CT CERVICAL SPINE FINDINGS

Alignment: Normal.

Skull base and vertebrae: No acute fracture. No primary bone lesion
or focal pathologic process.

Soft tissues and spinal canal: No prevertebral fluid or swelling. No
visible canal hematoma.

Disc levels: Multilevel degenerative disc disease, most pronounced
at C5-C6 and C6-C7. Mild multilevel facet arthropathy.

Upper chest: Unremarkable.

Other: None.
IMPRESSION: 1. No evidence of significant acute traumatic injury to the skull,
brain or cervical spine.
2. Moderate cerebral and mild cerebellar atrophy with chronic
microvascular ischemic changes in the cerebral white matter, as
above.
3. Multilevel degenerative disc disease and cervical spondylosis, as
above.
